# Patient Record
Sex: Female | Born: 1935
Health system: Southern US, Community
[De-identification: ages and names within clinical notes are randomized; demographics above are authoritative.]

## PROBLEM LIST (undated history)

## (undated) DIAGNOSIS — S329XXA Fracture of unspecified parts of lumbosacral spine and pelvis, initial encounter for closed fracture: Secondary | ICD-10-CM

## (undated) DIAGNOSIS — E041 Nontoxic single thyroid nodule: Secondary | ICD-10-CM

## (undated) DIAGNOSIS — D510 Vitamin B12 deficiency anemia due to intrinsic factor deficiency: Secondary | ICD-10-CM

## (undated) DIAGNOSIS — I219 Acute myocardial infarction, unspecified: Secondary | ICD-10-CM

## (undated) DIAGNOSIS — L259 Unspecified contact dermatitis, unspecified cause: Secondary | ICD-10-CM

## (undated) DIAGNOSIS — Z9889 Other specified postprocedural states: Secondary | ICD-10-CM

## (undated) DIAGNOSIS — I2510 Atherosclerotic heart disease of native coronary artery without angina pectoris: Secondary | ICD-10-CM

## (undated) DIAGNOSIS — K589 Irritable bowel syndrome without diarrhea: Secondary | ICD-10-CM

## (undated) DIAGNOSIS — Z8489 Family history of other specified conditions: Secondary | ICD-10-CM

## (undated) DIAGNOSIS — I639 Cerebral infarction, unspecified: Secondary | ICD-10-CM

## (undated) DIAGNOSIS — R9430 Abnormal result of cardiovascular function study, unspecified: Secondary | ICD-10-CM

## (undated) DIAGNOSIS — I5181 Takotsubo syndrome: Secondary | ICD-10-CM

## (undated) DIAGNOSIS — Z8719 Personal history of other diseases of the digestive system: Secondary | ICD-10-CM

## (undated) DIAGNOSIS — I69959 Hemiplegia and hemiparesis following unspecified cerebrovascular disease affecting unspecified side: Secondary | ICD-10-CM

## (undated) DIAGNOSIS — R001 Bradycardia, unspecified: Secondary | ICD-10-CM

## (undated) DIAGNOSIS — I10 Essential (primary) hypertension: Secondary | ICD-10-CM

## (undated) DIAGNOSIS — M1990 Unspecified osteoarthritis, unspecified site: Secondary | ICD-10-CM

## (undated) DIAGNOSIS — D320 Benign neoplasm of cerebral meninges: Secondary | ICD-10-CM

## (undated) DIAGNOSIS — J984 Other disorders of lung: Secondary | ICD-10-CM

## (undated) DIAGNOSIS — S066X9A Traumatic subarachnoid hemorrhage with loss of consciousness of unspecified duration, initial encounter: Secondary | ICD-10-CM

## (undated) DIAGNOSIS — R9389 Abnormal findings on diagnostic imaging of other specified body structures: Secondary | ICD-10-CM

## (undated) DIAGNOSIS — IMO0002 Reserved for concepts with insufficient information to code with codable children: Secondary | ICD-10-CM

## (undated) HISTORY — DX: Benign neoplasm of cerebral meninges: D32.0

## (undated) HISTORY — PX: ABDOMINAL HYSTERECTOMY: SHX81

## (undated) HISTORY — PX: SHOULDER SURGERY: SHX246

## (undated) HISTORY — DX: Abnormal result of cardiovascular function study, unspecified: R94.30

## (undated) HISTORY — DX: Traumatic subarachnoid hemorrhage with loss of consciousness of unspecified duration, initial encounter: S06.6X9A

## (undated) HISTORY — DX: Atherosclerotic heart disease of native coronary artery without angina pectoris: I25.10

## (undated) HISTORY — DX: Irritable bowel syndrome, unspecified: K58.9

## (undated) HISTORY — PX: CHOLECYSTECTOMY: SHX55

## (undated) HISTORY — DX: Hemiplegia and hemiparesis following unspecified cerebrovascular disease affecting unspecified side: I69.959

## (undated) HISTORY — DX: Unspecified contact dermatitis, unspecified cause: L25.9

## (undated) HISTORY — DX: Essential (primary) hypertension: I10

## (undated) HISTORY — DX: Takotsubo syndrome: I51.81

## (undated) HISTORY — DX: Other disorders of lung: J98.4

## (undated) HISTORY — DX: Vitamin B12 deficiency anemia due to intrinsic factor deficiency: D51.0

## (undated) HISTORY — DX: Reserved for concepts with insufficient information to code with codable children: IMO0002

## (undated) HISTORY — DX: Bradycardia, unspecified: R00.1

## (undated) HISTORY — DX: Unspecified osteoarthritis, unspecified site: M19.90

## (undated) HISTORY — DX: Nontoxic single thyroid nodule: E04.1

## (undated) HISTORY — PX: JOINT REPLACEMENT: SHX530

## (undated) HISTORY — PX: BRAIN TUMOR EXCISION: SHX577

---

## 1997-12-14 ENCOUNTER — Inpatient Hospital Stay (HOSPITAL_COMMUNITY): Admission: RE | Admit: 1997-12-14 | Discharge: 1997-12-19 | Payer: Self-pay | Admitting: Orthopaedic Surgery

## 1999-04-23 ENCOUNTER — Inpatient Hospital Stay (HOSPITAL_COMMUNITY): Admission: EM | Admit: 1999-04-23 | Discharge: 1999-04-24 | Payer: Self-pay | Admitting: Internal Medicine

## 1999-04-23 ENCOUNTER — Encounter: Payer: Self-pay | Admitting: Internal Medicine

## 1999-11-20 ENCOUNTER — Emergency Department (HOSPITAL_COMMUNITY): Admission: EM | Admit: 1999-11-20 | Discharge: 1999-11-20 | Payer: Self-pay

## 2000-11-29 ENCOUNTER — Encounter: Payer: Self-pay | Admitting: Emergency Medicine

## 2000-11-30 ENCOUNTER — Observation Stay (HOSPITAL_COMMUNITY): Admission: EM | Admit: 2000-11-30 | Discharge: 2000-12-01 | Payer: Self-pay | Admitting: Emergency Medicine

## 2000-12-01 ENCOUNTER — Encounter: Payer: Self-pay | Admitting: Internal Medicine

## 2001-01-12 ENCOUNTER — Encounter: Payer: Self-pay | Admitting: General Surgery

## 2001-01-13 ENCOUNTER — Encounter (INDEPENDENT_AMBULATORY_CARE_PROVIDER_SITE_OTHER): Payer: Self-pay | Admitting: *Deleted

## 2001-01-13 ENCOUNTER — Ambulatory Visit (HOSPITAL_COMMUNITY): Admission: RE | Admit: 2001-01-13 | Discharge: 2001-01-14 | Payer: Self-pay | Admitting: General Surgery

## 2001-01-13 ENCOUNTER — Encounter: Payer: Self-pay | Admitting: General Surgery

## 2001-06-17 ENCOUNTER — Encounter: Payer: Self-pay | Admitting: Internal Medicine

## 2001-08-18 ENCOUNTER — Encounter: Payer: Self-pay | Admitting: Internal Medicine

## 2001-08-18 ENCOUNTER — Inpatient Hospital Stay (HOSPITAL_COMMUNITY): Admission: EM | Admit: 2001-08-18 | Discharge: 2001-08-21 | Payer: Self-pay | Admitting: Internal Medicine

## 2001-08-20 ENCOUNTER — Encounter: Payer: Self-pay | Admitting: Gastroenterology

## 2002-11-13 ENCOUNTER — Emergency Department (HOSPITAL_COMMUNITY): Admission: EM | Admit: 2002-11-13 | Discharge: 2002-11-14 | Payer: Self-pay | Admitting: Emergency Medicine

## 2002-11-14 ENCOUNTER — Encounter: Payer: Self-pay | Admitting: *Deleted

## 2003-12-13 ENCOUNTER — Ambulatory Visit: Payer: Self-pay | Admitting: Internal Medicine

## 2004-01-06 ENCOUNTER — Ambulatory Visit: Payer: Self-pay | Admitting: Internal Medicine

## 2004-02-13 ENCOUNTER — Ambulatory Visit: Payer: Self-pay | Admitting: Internal Medicine

## 2004-03-16 ENCOUNTER — Ambulatory Visit: Payer: Self-pay | Admitting: Internal Medicine

## 2004-04-12 ENCOUNTER — Ambulatory Visit: Payer: Self-pay | Admitting: Internal Medicine

## 2004-05-09 ENCOUNTER — Ambulatory Visit: Payer: Self-pay | Admitting: Internal Medicine

## 2004-06-11 ENCOUNTER — Ambulatory Visit: Payer: Self-pay | Admitting: Internal Medicine

## 2004-07-11 ENCOUNTER — Ambulatory Visit: Payer: Self-pay | Admitting: Internal Medicine

## 2004-08-13 ENCOUNTER — Ambulatory Visit: Payer: Self-pay | Admitting: Internal Medicine

## 2004-09-11 ENCOUNTER — Ambulatory Visit: Payer: Self-pay | Admitting: Internal Medicine

## 2004-10-12 ENCOUNTER — Ambulatory Visit: Payer: Self-pay | Admitting: Internal Medicine

## 2004-11-14 ENCOUNTER — Ambulatory Visit: Payer: Self-pay | Admitting: Internal Medicine

## 2004-12-14 ENCOUNTER — Ambulatory Visit: Payer: Self-pay | Admitting: Internal Medicine

## 2004-12-20 ENCOUNTER — Ambulatory Visit: Payer: Self-pay | Admitting: Internal Medicine

## 2004-12-24 ENCOUNTER — Ambulatory Visit: Payer: Self-pay | Admitting: Internal Medicine

## 2005-01-02 ENCOUNTER — Ambulatory Visit: Payer: Self-pay | Admitting: Internal Medicine

## 2005-01-09 ENCOUNTER — Ambulatory Visit: Payer: Self-pay | Admitting: Internal Medicine

## 2005-02-04 DIAGNOSIS — S066X9A Traumatic subarachnoid hemorrhage with loss of consciousness of unspecified duration, initial encounter: Secondary | ICD-10-CM

## 2005-02-04 DIAGNOSIS — S066XAA Traumatic subarachnoid hemorrhage with loss of consciousness status unknown, initial encounter: Secondary | ICD-10-CM

## 2005-02-04 HISTORY — DX: Traumatic subarachnoid hemorrhage with loss of consciousness status unknown, initial encounter: S06.6XAA

## 2005-02-04 HISTORY — DX: Traumatic subarachnoid hemorrhage with loss of consciousness of unspecified duration, initial encounter: S06.6X9A

## 2005-02-13 ENCOUNTER — Ambulatory Visit: Payer: Self-pay | Admitting: Internal Medicine

## 2005-03-11 ENCOUNTER — Ambulatory Visit: Payer: Self-pay | Admitting: Internal Medicine

## 2005-04-10 ENCOUNTER — Ambulatory Visit: Payer: Self-pay | Admitting: Internal Medicine

## 2005-05-22 ENCOUNTER — Ambulatory Visit: Payer: Self-pay | Admitting: Internal Medicine

## 2005-06-12 ENCOUNTER — Ambulatory Visit: Payer: Self-pay | Admitting: Internal Medicine

## 2005-06-19 ENCOUNTER — Ambulatory Visit: Payer: Self-pay | Admitting: Internal Medicine

## 2005-07-17 ENCOUNTER — Ambulatory Visit: Payer: Self-pay | Admitting: Internal Medicine

## 2005-08-14 ENCOUNTER — Ambulatory Visit: Payer: Self-pay | Admitting: Internal Medicine

## 2005-09-11 ENCOUNTER — Ambulatory Visit: Payer: Self-pay | Admitting: Internal Medicine

## 2005-10-10 ENCOUNTER — Ambulatory Visit: Payer: Self-pay | Admitting: Internal Medicine

## 2005-11-07 ENCOUNTER — Ambulatory Visit: Payer: Self-pay | Admitting: Internal Medicine

## 2005-11-20 ENCOUNTER — Ambulatory Visit: Payer: Self-pay | Admitting: Internal Medicine

## 2005-11-28 ENCOUNTER — Ambulatory Visit: Payer: Self-pay

## 2005-11-28 ENCOUNTER — Encounter: Payer: Self-pay | Admitting: Internal Medicine

## 2005-12-18 ENCOUNTER — Ambulatory Visit: Payer: Self-pay | Admitting: Internal Medicine

## 2006-01-15 ENCOUNTER — Ambulatory Visit: Payer: Self-pay | Admitting: Internal Medicine

## 2006-01-18 ENCOUNTER — Observation Stay (HOSPITAL_COMMUNITY): Admission: EM | Admit: 2006-01-18 | Discharge: 2006-01-19 | Payer: Self-pay | Admitting: Emergency Medicine

## 2006-01-24 ENCOUNTER — Ambulatory Visit: Payer: Self-pay | Admitting: Family Medicine

## 2006-02-12 ENCOUNTER — Ambulatory Visit: Payer: Self-pay | Admitting: Internal Medicine

## 2006-02-20 ENCOUNTER — Encounter: Admission: RE | Admit: 2006-02-20 | Discharge: 2006-02-20 | Payer: Self-pay | Admitting: Neurosurgery

## 2006-03-14 ENCOUNTER — Ambulatory Visit: Payer: Self-pay | Admitting: Internal Medicine

## 2006-03-20 ENCOUNTER — Encounter: Payer: Self-pay | Admitting: Internal Medicine

## 2006-04-11 ENCOUNTER — Ambulatory Visit: Payer: Self-pay | Admitting: Internal Medicine

## 2006-05-12 ENCOUNTER — Ambulatory Visit: Payer: Self-pay | Admitting: Internal Medicine

## 2006-05-27 ENCOUNTER — Ambulatory Visit: Payer: Self-pay | Admitting: Internal Medicine

## 2006-06-12 ENCOUNTER — Ambulatory Visit: Payer: Self-pay | Admitting: Internal Medicine

## 2006-06-19 ENCOUNTER — Ambulatory Visit: Payer: Self-pay | Admitting: Internal Medicine

## 2006-06-19 ENCOUNTER — Inpatient Hospital Stay (HOSPITAL_COMMUNITY): Admission: EM | Admit: 2006-06-19 | Discharge: 2006-06-23 | Payer: Self-pay | Admitting: Emergency Medicine

## 2006-06-19 ENCOUNTER — Ambulatory Visit: Payer: Self-pay | Admitting: Cardiology

## 2006-06-20 ENCOUNTER — Encounter: Payer: Self-pay | Admitting: Internal Medicine

## 2006-06-22 ENCOUNTER — Encounter: Payer: Self-pay | Admitting: Internal Medicine

## 2006-06-23 ENCOUNTER — Encounter: Payer: Self-pay | Admitting: Internal Medicine

## 2006-07-01 ENCOUNTER — Ambulatory Visit: Payer: Self-pay | Admitting: Cardiology

## 2006-07-01 LAB — CONVERTED CEMR LAB
Albumin: 3.6 g/dL (ref 3.5–5.2)
CO2: 29 meq/L (ref 19–32)
Creatinine, Ser: 0.7 mg/dL (ref 0.4–1.2)
Phosphorus: 3.6 mg/dL (ref 2.3–4.6)
Sodium: 139 meq/L (ref 135–145)

## 2006-07-02 ENCOUNTER — Ambulatory Visit: Payer: Self-pay | Admitting: Internal Medicine

## 2006-07-09 ENCOUNTER — Ambulatory Visit: Payer: Self-pay | Admitting: Cardiology

## 2006-07-14 ENCOUNTER — Ambulatory Visit: Payer: Self-pay | Admitting: Internal Medicine

## 2006-07-15 ENCOUNTER — Encounter: Payer: Self-pay | Admitting: Internal Medicine

## 2006-07-15 DIAGNOSIS — I10 Essential (primary) hypertension: Secondary | ICD-10-CM | POA: Insufficient documentation

## 2006-07-15 DIAGNOSIS — Z9889 Other specified postprocedural states: Secondary | ICD-10-CM | POA: Insufficient documentation

## 2006-07-15 DIAGNOSIS — M1990 Unspecified osteoarthritis, unspecified site: Secondary | ICD-10-CM | POA: Insufficient documentation

## 2006-07-15 HISTORY — DX: Unspecified osteoarthritis, unspecified site: M19.90

## 2006-07-15 HISTORY — DX: Essential (primary) hypertension: I10

## 2006-07-21 ENCOUNTER — Ambulatory Visit: Payer: Self-pay | Admitting: Internal Medicine

## 2006-07-21 ENCOUNTER — Inpatient Hospital Stay (HOSPITAL_COMMUNITY): Admission: AD | Admit: 2006-07-21 | Discharge: 2006-07-24 | Payer: Self-pay | Admitting: Internal Medicine

## 2006-07-29 ENCOUNTER — Ambulatory Visit: Payer: Self-pay | Admitting: Internal Medicine

## 2006-07-29 LAB — CONVERTED CEMR LAB
AST: 35 units/L (ref 0–37)
Bilirubin, Direct: 0.1 mg/dL (ref 0.0–0.3)
Total Bilirubin: 0.6 mg/dL (ref 0.3–1.2)
Total Protein: 6.1 g/dL (ref 6.0–8.3)

## 2006-08-04 ENCOUNTER — Ambulatory Visit: Payer: Self-pay | Admitting: Cardiology

## 2006-08-04 LAB — CONVERTED CEMR LAB
ALT: 29 units/L (ref 0–35)
Alkaline Phosphatase: 220 units/L — ABNORMAL HIGH (ref 39–117)
Cholesterol: 192 mg/dL (ref 0–200)
Total Bilirubin: 0.7 mg/dL (ref 0.3–1.2)
Total Protein: 7 g/dL (ref 6.0–8.3)

## 2006-08-13 ENCOUNTER — Ambulatory Visit: Payer: Self-pay | Admitting: Internal Medicine

## 2006-08-14 ENCOUNTER — Ambulatory Visit: Payer: Self-pay | Admitting: Cardiology

## 2006-08-21 ENCOUNTER — Encounter: Payer: Self-pay | Admitting: Internal Medicine

## 2006-08-22 ENCOUNTER — Encounter: Payer: Self-pay | Admitting: Cardiology

## 2006-08-22 ENCOUNTER — Ambulatory Visit: Payer: Self-pay

## 2006-08-27 ENCOUNTER — Ambulatory Visit: Payer: Self-pay | Admitting: Internal Medicine

## 2006-08-27 DIAGNOSIS — D320 Benign neoplasm of cerebral meninges: Secondary | ICD-10-CM | POA: Insufficient documentation

## 2006-08-27 HISTORY — DX: Benign neoplasm of cerebral meninges: D32.0

## 2006-09-10 ENCOUNTER — Ambulatory Visit: Payer: Self-pay | Admitting: Internal Medicine

## 2006-09-10 DIAGNOSIS — N3000 Acute cystitis without hematuria: Secondary | ICD-10-CM | POA: Insufficient documentation

## 2006-09-10 DIAGNOSIS — D510 Vitamin B12 deficiency anemia due to intrinsic factor deficiency: Secondary | ICD-10-CM

## 2006-09-10 HISTORY — DX: Vitamin B12 deficiency anemia due to intrinsic factor deficiency: D51.0

## 2006-09-17 ENCOUNTER — Encounter: Payer: Self-pay | Admitting: Internal Medicine

## 2006-09-18 ENCOUNTER — Encounter: Payer: Self-pay | Admitting: Internal Medicine

## 2006-09-23 ENCOUNTER — Encounter: Payer: Self-pay | Admitting: Internal Medicine

## 2006-09-27 ENCOUNTER — Encounter: Payer: Self-pay | Admitting: Internal Medicine

## 2006-10-08 ENCOUNTER — Encounter: Payer: Self-pay | Admitting: Internal Medicine

## 2006-10-10 ENCOUNTER — Telehealth: Payer: Self-pay | Admitting: Family Medicine

## 2006-10-14 ENCOUNTER — Ambulatory Visit: Payer: Self-pay | Admitting: Internal Medicine

## 2006-10-14 LAB — CONVERTED CEMR LAB
Ketones, urine, test strip: NEGATIVE
Nitrite: POSITIVE
Specific Gravity, Urine: 1.025
pH: 5

## 2006-10-16 ENCOUNTER — Encounter: Payer: Self-pay | Admitting: Internal Medicine

## 2006-10-24 ENCOUNTER — Ambulatory Visit: Payer: Self-pay | Admitting: Cardiology

## 2006-11-12 ENCOUNTER — Ambulatory Visit: Payer: Self-pay | Admitting: Internal Medicine

## 2006-12-01 ENCOUNTER — Encounter: Payer: Self-pay | Admitting: Internal Medicine

## 2006-12-03 ENCOUNTER — Ambulatory Visit: Payer: Self-pay | Admitting: Internal Medicine

## 2006-12-10 ENCOUNTER — Ambulatory Visit: Payer: Self-pay | Admitting: Internal Medicine

## 2006-12-18 ENCOUNTER — Encounter: Payer: Self-pay | Admitting: Internal Medicine

## 2007-01-09 ENCOUNTER — Ambulatory Visit: Payer: Self-pay | Admitting: Internal Medicine

## 2007-01-14 ENCOUNTER — Ambulatory Visit: Payer: Self-pay | Admitting: Internal Medicine

## 2007-01-14 DIAGNOSIS — E041 Nontoxic single thyroid nodule: Secondary | ICD-10-CM | POA: Insufficient documentation

## 2007-01-14 DIAGNOSIS — R911 Solitary pulmonary nodule: Secondary | ICD-10-CM | POA: Insufficient documentation

## 2007-01-14 DIAGNOSIS — J984 Other disorders of lung: Secondary | ICD-10-CM

## 2007-01-14 HISTORY — DX: Nontoxic single thyroid nodule: E04.1

## 2007-01-14 HISTORY — DX: Other disorders of lung: J98.4

## 2007-01-15 ENCOUNTER — Ambulatory Visit: Payer: Self-pay | Admitting: Cardiology

## 2007-02-12 ENCOUNTER — Ambulatory Visit: Payer: Self-pay | Admitting: Internal Medicine

## 2007-03-02 ENCOUNTER — Ambulatory Visit: Payer: Self-pay | Admitting: Internal Medicine

## 2007-03-02 DIAGNOSIS — L259 Unspecified contact dermatitis, unspecified cause: Secondary | ICD-10-CM

## 2007-03-02 HISTORY — DX: Unspecified contact dermatitis, unspecified cause: L25.9

## 2007-03-16 ENCOUNTER — Ambulatory Visit: Payer: Self-pay | Admitting: Internal Medicine

## 2007-03-23 ENCOUNTER — Encounter: Payer: Self-pay | Admitting: Internal Medicine

## 2007-03-25 ENCOUNTER — Ambulatory Visit: Payer: Self-pay | Admitting: Internal Medicine

## 2007-04-13 ENCOUNTER — Ambulatory Visit: Payer: Self-pay | Admitting: Internal Medicine

## 2007-05-13 ENCOUNTER — Ambulatory Visit: Payer: Self-pay | Admitting: Internal Medicine

## 2007-06-01 ENCOUNTER — Ambulatory Visit: Payer: Self-pay | Admitting: Internal Medicine

## 2007-06-01 DIAGNOSIS — R071 Chest pain on breathing: Secondary | ICD-10-CM | POA: Insufficient documentation

## 2007-06-18 ENCOUNTER — Telehealth: Payer: Self-pay | Admitting: Internal Medicine

## 2007-06-18 ENCOUNTER — Inpatient Hospital Stay (HOSPITAL_COMMUNITY): Admission: EM | Admit: 2007-06-18 | Discharge: 2007-06-22 | Payer: Self-pay | Admitting: Emergency Medicine

## 2007-06-18 ENCOUNTER — Encounter (INDEPENDENT_AMBULATORY_CARE_PROVIDER_SITE_OTHER): Payer: Self-pay | Admitting: Neurology

## 2007-06-18 ENCOUNTER — Ambulatory Visit: Payer: Self-pay | Admitting: Cardiology

## 2007-06-19 ENCOUNTER — Telehealth: Payer: Self-pay | Admitting: Internal Medicine

## 2007-06-19 ENCOUNTER — Encounter: Payer: Self-pay | Admitting: Cardiology

## 2007-06-19 ENCOUNTER — Encounter (INDEPENDENT_AMBULATORY_CARE_PROVIDER_SITE_OTHER): Payer: Self-pay | Admitting: Neurology

## 2007-07-03 ENCOUNTER — Telehealth: Payer: Self-pay | Admitting: Internal Medicine

## 2007-07-13 ENCOUNTER — Ambulatory Visit: Payer: Self-pay | Admitting: Internal Medicine

## 2007-07-13 ENCOUNTER — Ambulatory Visit: Payer: Self-pay | Admitting: Cardiology

## 2007-07-21 ENCOUNTER — Encounter: Payer: Self-pay | Admitting: Internal Medicine

## 2007-07-22 ENCOUNTER — Ambulatory Visit: Payer: Self-pay | Admitting: Internal Medicine

## 2007-07-22 DIAGNOSIS — I69959 Hemiplegia and hemiparesis following unspecified cerebrovascular disease affecting unspecified side: Secondary | ICD-10-CM

## 2007-07-22 HISTORY — DX: Hemiplegia and hemiparesis following unspecified cerebrovascular disease affecting unspecified side: I69.959

## 2007-07-24 ENCOUNTER — Ambulatory Visit: Payer: Self-pay

## 2007-08-11 ENCOUNTER — Ambulatory Visit: Payer: Self-pay | Admitting: Internal Medicine

## 2007-08-11 ENCOUNTER — Encounter: Admission: RE | Admit: 2007-08-11 | Discharge: 2007-11-03 | Payer: Self-pay | Admitting: Neurology

## 2007-09-08 ENCOUNTER — Ambulatory Visit: Payer: Self-pay | Admitting: Internal Medicine

## 2007-09-10 ENCOUNTER — Inpatient Hospital Stay (HOSPITAL_COMMUNITY): Admission: EM | Admit: 2007-09-10 | Discharge: 2007-09-11 | Payer: Self-pay | Admitting: Emergency Medicine

## 2007-09-10 ENCOUNTER — Ambulatory Visit: Payer: Self-pay | Admitting: Endocrinology

## 2007-09-15 ENCOUNTER — Encounter: Payer: Self-pay | Admitting: Internal Medicine

## 2007-09-24 ENCOUNTER — Ambulatory Visit: Payer: Self-pay | Admitting: Internal Medicine

## 2007-10-05 ENCOUNTER — Encounter: Payer: Self-pay | Admitting: Internal Medicine

## 2007-10-06 ENCOUNTER — Ambulatory Visit: Payer: Self-pay | Admitting: Internal Medicine

## 2007-10-07 ENCOUNTER — Ambulatory Visit: Payer: Self-pay | Admitting: Cardiology

## 2007-11-11 ENCOUNTER — Ambulatory Visit: Payer: Self-pay | Admitting: Internal Medicine

## 2007-11-24 ENCOUNTER — Ambulatory Visit: Payer: Self-pay | Admitting: Internal Medicine

## 2007-11-25 ENCOUNTER — Encounter: Payer: Self-pay | Admitting: Internal Medicine

## 2007-12-09 ENCOUNTER — Ambulatory Visit: Payer: Self-pay | Admitting: Cardiology

## 2007-12-09 ENCOUNTER — Encounter: Payer: Self-pay | Admitting: Cardiology

## 2007-12-09 ENCOUNTER — Observation Stay (HOSPITAL_COMMUNITY): Admission: EM | Admit: 2007-12-09 | Discharge: 2007-12-11 | Payer: Self-pay | Admitting: Emergency Medicine

## 2007-12-15 ENCOUNTER — Ambulatory Visit: Payer: Self-pay | Admitting: Internal Medicine

## 2008-01-08 ENCOUNTER — Ambulatory Visit: Payer: Self-pay | Admitting: Cardiology

## 2008-01-12 ENCOUNTER — Ambulatory Visit: Payer: Self-pay | Admitting: Internal Medicine

## 2008-01-12 ENCOUNTER — Encounter: Admission: RE | Admit: 2008-01-12 | Discharge: 2008-01-12 | Payer: Self-pay | Admitting: Neurology

## 2008-02-12 ENCOUNTER — Ambulatory Visit: Payer: Self-pay | Admitting: Internal Medicine

## 2008-03-15 ENCOUNTER — Ambulatory Visit: Payer: Self-pay | Admitting: Internal Medicine

## 2008-03-29 ENCOUNTER — Ambulatory Visit: Payer: Self-pay | Admitting: Internal Medicine

## 2008-04-12 ENCOUNTER — Ambulatory Visit: Payer: Self-pay | Admitting: Internal Medicine

## 2008-04-22 ENCOUNTER — Encounter: Payer: Self-pay | Admitting: Cardiology

## 2008-04-22 ENCOUNTER — Ambulatory Visit: Payer: Self-pay | Admitting: Cardiology

## 2008-04-22 DIAGNOSIS — I5181 Takotsubo syndrome: Secondary | ICD-10-CM

## 2008-04-22 HISTORY — DX: Takotsubo syndrome: I51.81

## 2008-05-10 ENCOUNTER — Ambulatory Visit: Payer: Self-pay | Admitting: Internal Medicine

## 2008-05-19 ENCOUNTER — Encounter: Payer: Self-pay | Admitting: Internal Medicine

## 2008-05-31 ENCOUNTER — Encounter: Payer: Self-pay | Admitting: Internal Medicine

## 2008-06-07 ENCOUNTER — Ambulatory Visit: Payer: Self-pay | Admitting: Internal Medicine

## 2008-06-28 ENCOUNTER — Ambulatory Visit: Payer: Self-pay | Admitting: Internal Medicine

## 2008-06-28 ENCOUNTER — Ambulatory Visit: Payer: Self-pay | Admitting: Cardiology

## 2008-06-28 ENCOUNTER — Observation Stay (HOSPITAL_COMMUNITY): Admission: AD | Admit: 2008-06-28 | Discharge: 2008-06-29 | Payer: Self-pay | Admitting: Internal Medicine

## 2008-07-05 ENCOUNTER — Ambulatory Visit: Payer: Self-pay | Admitting: Internal Medicine

## 2008-07-08 ENCOUNTER — Inpatient Hospital Stay (HOSPITAL_COMMUNITY): Admission: EM | Admit: 2008-07-08 | Discharge: 2008-07-11 | Payer: Self-pay | Admitting: Emergency Medicine

## 2008-07-08 ENCOUNTER — Ambulatory Visit: Payer: Self-pay | Admitting: Internal Medicine

## 2008-07-11 ENCOUNTER — Encounter (INDEPENDENT_AMBULATORY_CARE_PROVIDER_SITE_OTHER): Payer: Self-pay | Admitting: Neurology

## 2008-07-21 ENCOUNTER — Ambulatory Visit: Payer: Self-pay | Admitting: Internal Medicine

## 2008-07-21 LAB — CONVERTED CEMR LAB
Basophils Absolute: 0 10*3/uL (ref 0.0–0.1)
Eosinophils Absolute: 0.3 10*3/uL (ref 0.0–0.7)
Hemoglobin: 11.2 g/dL — ABNORMAL LOW (ref 12.0–15.0)
Lymphocytes Relative: 25.4 % (ref 12.0–46.0)
MCHC: 32.7 g/dL (ref 30.0–36.0)
Monocytes Absolute: 0.8 10*3/uL (ref 0.1–1.0)
Neutro Abs: 4.6 10*3/uL (ref 1.4–7.7)
RDW: 15.7 % — ABNORMAL HIGH (ref 11.5–14.6)

## 2008-08-05 ENCOUNTER — Encounter: Payer: Self-pay | Admitting: Cardiology

## 2008-08-09 ENCOUNTER — Encounter: Payer: Self-pay | Admitting: Cardiology

## 2008-08-09 ENCOUNTER — Ambulatory Visit: Payer: Self-pay | Admitting: Cardiology

## 2008-08-09 ENCOUNTER — Ambulatory Visit: Payer: Self-pay

## 2008-08-10 ENCOUNTER — Ambulatory Visit: Payer: Self-pay | Admitting: Internal Medicine

## 2008-09-02 ENCOUNTER — Encounter: Admission: RE | Admit: 2008-09-02 | Discharge: 2008-09-02 | Payer: Self-pay | Admitting: Chiropractic Medicine

## 2008-09-07 ENCOUNTER — Ambulatory Visit: Payer: Self-pay | Admitting: Internal Medicine

## 2008-09-27 ENCOUNTER — Encounter (INDEPENDENT_AMBULATORY_CARE_PROVIDER_SITE_OTHER): Payer: Self-pay | Admitting: *Deleted

## 2008-10-04 ENCOUNTER — Ambulatory Visit: Payer: Self-pay | Admitting: Internal Medicine

## 2008-10-27 ENCOUNTER — Encounter (INDEPENDENT_AMBULATORY_CARE_PROVIDER_SITE_OTHER): Payer: Self-pay | Admitting: *Deleted

## 2008-11-04 ENCOUNTER — Ambulatory Visit: Payer: Self-pay | Admitting: Internal Medicine

## 2008-11-07 ENCOUNTER — Ambulatory Visit: Payer: Self-pay | Admitting: Internal Medicine

## 2008-11-07 DIAGNOSIS — I2510 Atherosclerotic heart disease of native coronary artery without angina pectoris: Secondary | ICD-10-CM

## 2008-11-07 HISTORY — DX: Atherosclerotic heart disease of native coronary artery without angina pectoris: I25.10

## 2008-12-06 ENCOUNTER — Ambulatory Visit: Payer: Self-pay | Admitting: Internal Medicine

## 2008-12-14 ENCOUNTER — Encounter: Payer: Self-pay | Admitting: Cardiology

## 2009-01-04 ENCOUNTER — Ambulatory Visit: Payer: Self-pay | Admitting: Internal Medicine

## 2009-02-07 ENCOUNTER — Ambulatory Visit: Payer: Self-pay | Admitting: Internal Medicine

## 2009-02-20 ENCOUNTER — Encounter: Payer: Self-pay | Admitting: Cardiology

## 2009-02-21 ENCOUNTER — Ambulatory Visit: Payer: Self-pay | Admitting: Cardiology

## 2009-03-10 ENCOUNTER — Ambulatory Visit: Payer: Self-pay | Admitting: Internal Medicine

## 2009-04-07 ENCOUNTER — Ambulatory Visit: Payer: Self-pay | Admitting: Internal Medicine

## 2009-04-24 ENCOUNTER — Telehealth: Payer: Self-pay | Admitting: Internal Medicine

## 2009-05-16 ENCOUNTER — Ambulatory Visit: Payer: Self-pay | Admitting: Internal Medicine

## 2009-06-13 ENCOUNTER — Ambulatory Visit: Payer: Self-pay | Admitting: Internal Medicine

## 2009-06-22 ENCOUNTER — Encounter (INDEPENDENT_AMBULATORY_CARE_PROVIDER_SITE_OTHER): Payer: Self-pay | Admitting: *Deleted

## 2009-07-12 ENCOUNTER — Ambulatory Visit: Payer: Self-pay | Admitting: Internal Medicine

## 2009-07-20 ENCOUNTER — Encounter: Payer: Self-pay | Admitting: Internal Medicine

## 2009-07-20 ENCOUNTER — Encounter: Payer: Self-pay | Admitting: Cardiology

## 2009-08-16 ENCOUNTER — Ambulatory Visit: Payer: Self-pay | Admitting: Cardiology

## 2009-09-06 ENCOUNTER — Ambulatory Visit: Payer: Self-pay | Admitting: Internal Medicine

## 2009-09-20 ENCOUNTER — Ambulatory Visit: Payer: Self-pay | Admitting: Internal Medicine

## 2009-09-20 LAB — CONVERTED CEMR LAB
Ketones, urine, test strip: NEGATIVE
Nitrite: NEGATIVE
Specific Gravity, Urine: 1.015
WBC Urine, dipstick: NEGATIVE

## 2009-09-21 LAB — CONVERTED CEMR LAB
ALT: 12 units/L (ref 0–35)
AST: 15 units/L (ref 0–37)
BUN: 21 mg/dL (ref 6–23)
Basophils Absolute: 0 10*3/uL (ref 0.0–0.1)
Bilirubin, Direct: 0.1 mg/dL (ref 0.0–0.3)
Calcium: 9.1 mg/dL (ref 8.4–10.5)
Creatinine, Ser: 1 mg/dL (ref 0.4–1.2)
Eosinophils Relative: 2.8 % (ref 0.0–5.0)
GFR calc non Af Amer: 58.63 mL/min (ref >60)
Lymphocytes Relative: 16.2 % (ref 12.0–46.0)
Monocytes Relative: 11.6 % (ref 3.0–12.0)
Neutrophils Relative %: 68.9 % (ref 43.0–77.0)
Platelets: 217 10*3/uL (ref 150.0–400.0)
Potassium: 4.5 meq/L (ref 3.5–5.1)
Sed Rate: 14 mm/hr (ref 0–22)
Total Bilirubin: 0.6 mg/dL (ref 0.3–1.2)
WBC: 9.9 10*3/uL (ref 4.5–10.5)

## 2009-10-03 ENCOUNTER — Encounter
Admission: RE | Admit: 2009-10-03 | Discharge: 2009-10-20 | Payer: Self-pay | Source: Home / Self Care | Admitting: Internal Medicine

## 2009-10-04 ENCOUNTER — Encounter: Payer: Self-pay | Admitting: Internal Medicine

## 2009-10-05 ENCOUNTER — Ambulatory Visit: Payer: Self-pay | Admitting: Internal Medicine

## 2009-10-19 ENCOUNTER — Encounter: Payer: Self-pay | Admitting: Internal Medicine

## 2009-11-07 ENCOUNTER — Ambulatory Visit: Payer: Self-pay | Admitting: Internal Medicine

## 2009-12-12 ENCOUNTER — Ambulatory Visit: Payer: Self-pay | Admitting: Internal Medicine

## 2009-12-19 ENCOUNTER — Encounter: Payer: Self-pay | Admitting: Internal Medicine

## 2009-12-21 ENCOUNTER — Ambulatory Visit: Payer: Self-pay | Admitting: Internal Medicine

## 2009-12-21 ENCOUNTER — Encounter: Payer: Self-pay | Admitting: Internal Medicine

## 2009-12-26 ENCOUNTER — Ambulatory Visit: Payer: Self-pay | Admitting: Internal Medicine

## 2009-12-26 ENCOUNTER — Telehealth: Payer: Self-pay | Admitting: Internal Medicine

## 2010-01-02 ENCOUNTER — Ambulatory Visit: Payer: Self-pay | Admitting: Cardiology

## 2010-01-05 DIAGNOSIS — R935 Abnormal findings on diagnostic imaging of other abdominal regions, including retroperitoneum: Secondary | ICD-10-CM | POA: Insufficient documentation

## 2010-01-09 ENCOUNTER — Ambulatory Visit (HOSPITAL_COMMUNITY)
Admission: RE | Admit: 2010-01-09 | Discharge: 2010-01-09 | Payer: Self-pay | Source: Home / Self Care | Attending: Internal Medicine | Admitting: Internal Medicine

## 2010-01-10 ENCOUNTER — Ambulatory Visit: Payer: Self-pay | Admitting: Internal Medicine

## 2010-01-12 ENCOUNTER — Ambulatory Visit: Payer: Self-pay | Admitting: Internal Medicine

## 2010-01-24 ENCOUNTER — Ambulatory Visit: Payer: Self-pay | Admitting: Pulmonary Disease

## 2010-01-25 ENCOUNTER — Telehealth: Payer: Self-pay

## 2010-01-25 DIAGNOSIS — M25519 Pain in unspecified shoulder: Secondary | ICD-10-CM | POA: Insufficient documentation

## 2010-01-25 DIAGNOSIS — M549 Dorsalgia, unspecified: Secondary | ICD-10-CM | POA: Insufficient documentation

## 2010-02-09 ENCOUNTER — Ambulatory Visit
Admission: RE | Admit: 2010-02-09 | Discharge: 2010-02-09 | Payer: Self-pay | Source: Home / Self Care | Attending: Internal Medicine | Admitting: Internal Medicine

## 2010-02-19 ENCOUNTER — Encounter: Payer: Self-pay | Admitting: Internal Medicine

## 2010-02-20 ENCOUNTER — Ambulatory Visit
Admission: RE | Admit: 2010-02-20 | Discharge: 2010-02-20 | Payer: Self-pay | Source: Home / Self Care | Attending: Cardiology | Admitting: Cardiology

## 2010-03-06 NOTE — Assessment & Plan Note (Signed)
Summary: 4 month rov/njr rsc bmp/njr   Vital Signs:  Patient profile:   77 year old female Weight:      123 pounds Temp:     97.6 degrees F oral BP sitting:   112 / 62  (right arm) Cuff size:   regular  Vitals Entered By: Kathrynn Speed CMA (September 20, 2009 10:49 AM) CC: 4 month rov, trouble urinating only drops at a time, stomach pain, for couple of years, meds not helping, getting worse,wakes her in the night, Right shoulder pain, for years, src   Primary Care Provider:   Victoriano Lain M.D.  CC:  4 month rov, trouble urinating only drops at a time, stomach pain, for couple of years, meds not helping, getting worse, wakes her in the night, Right shoulder pain, for years, and src.  History of Present Illness:  77 year old patient who is seen today for follow up.  she has a history of coronary artery disease, hypertension, and osteoarthritis.  She has multiple complaints today.  This includes persistent pain involving the left upper interscapular region of the back with radiation to the left chest wall.  Also describes her chronic abdominal pain.  Additionally, she has some urinary frequency and dribbling, but no definite dysuria.  She does have a history of pernicious anemia.  She describes postprandial abdominal pain and some nausea.  There is been no documented weight loss.  She does have occasional diarrhea that is stable at present.  Also complains of some episodic rhinorrhea mainly in the morning  Current Medications (verified): 1)  Carvedilol 6.25 Mg Tabs (Carvedilol) .... Take One Tablet By Mouth Twice A Day 2)  Premarin 0.625 Mg/gm  Crea (Estrogens, Conjugated) .... Use As Needed 3)  Cyanocobalamin 1000 Mcg/ml Soln (Cyanocobalamin) .... Administer 1 Ml Intramuscularly Once A Month 4)  Nitroquick 0.4 Mg  Subl (Nitroglycerin) .... As Needed 5)  Triamcinolone Acetonide 0.1 %  Oint (Triamcinolone Acetonide) .... Once Daily As Needed 6)  Dipyridamole 75 Mg Tabs (Dipyridamole) ....  One Twice Daily 7)  Ecotrin 325 Mg Tbec (Aspirin) .... One Daily  Allergies (verified): 1)  ! Sulf-10 2)  ! Dilantin (Phenytoin Sodium Extended) 3)  ! Phenergan (Promethazine Hcl) 4)  ! Lipitor (Atorvastatin) 5)  Sulfamethoxazole (Sulfamethoxazole)  Past History:  Past Medical History: Reviewed history from 08/16/2009 and no changes required. Hypertension Occipital meningioma treated with surgery at [facility] in 2008 / stable by MRI...[facility]....10/2008  /  patient seen [facility]  June, 2011... MRI... no change in lesion... status post gamma knife for right occipital atypical meningioma MI in May 2008.  See note July 24, 2007.  Question of takotsubo event. /  echo... July, 2010 .Marland Kitchen excellent LV function EF 55-60%...echo.Marland Kitchen.08/2008 CVA in May 2009...decision by neurology was aspirin and Plavix.  Subarachnoid hemorrhage secondary to fall in December 2007..traumatic Osteoarthritis B-12 deficiency DJD IBS thyroid nodule.  Right pontine infarct (stroke) July 08, 2008..changed to Aggrenox  Past Surgical History: Reviewed history from 06/28/2008 and no changes required. Cholecystectomy Hysterectomy Hip surgery  status-post resection meningioma 2008; status post radiosurgery for residual meningioma, Jun 09, 2008  right rotator cuff repair heart catheterization- December 08, 2007  Review of Systems       The patient complains of anorexia, weight loss, chest pain, abdominal pain, and muscle weakness.  The patient denies fever, weight gain, vision loss, decreased hearing, hoarseness, syncope, dyspnea on exertion, peripheral edema, prolonged cough, headaches, hemoptysis, melena, hematochezia, severe indigestion/heartburn, hematuria, incontinence, genital sores, suspicious skin lesions, transient  blindness, difficulty walking, depression, unusual weight change, abnormal bleeding, enlarged lymph nodes, angioedema, and breast masses.    Physical Exam  General:  Well-developed,well-nourished,in no acute  distress; alert,appropriate and cooperative throughout examination; 110/68 Head:  Normocephalic and atraumatic without obvious abnormalities. No apparent alopecia or balding. Mouth:  Oral mucosa and oropharynx without lesions or exudates.  Teeth in good repair. Chest Wall:   some tenderness over the left interscapular area Breasts:  No mass, nodules, thickening, tenderness, bulging, retraction, inflamation, nipple discharge or skin changes noted.   Lungs:  Normal respiratory effort, chest expands symmetrically. Lungs are clear to auscultation, no crackles or wheezes. Heart:  Normal rate and regular rhythm. S1 and S2 normal without gallop, murmur, click, rub or other extra sounds. Abdomen:  Bowel sounds positive,abdomen soft and non-tender without masses, organomegaly or hernias noted. Msk:  No deformity or scoliosis noted of thoracic or lumbar spine.   Pulses:  R and L carotid,radial,femoral,dorsalis pedis and posterior tibial pulses are full and equal bilaterally Extremities:  No clubbing, cyanosis, edema, or deformity noted with normal full range of motion of all joints.   Skin:  Intact without suspicious lesions or rashes Cervical Nodes:  No lymphadenopathy noted   Impression & Recommendations:  Problem # 1:  * PAIN UNDER HER LEFT SCAPULA  Problem # 2:  CORONARY ARTERY DISEASE (ICD-414.00)  Her updated medication list for this problem includes:    Carvedilol 6.25 Mg Tabs (Carvedilol) .Marland Kitchen... Take one tablet by mouth twice a day    Nitroquick 0.4 Mg Subl (Nitroglycerin) .Marland Kitchen... As needed    Dipyridamole 75 Mg Tabs (Dipyridamole) ..... One twice daily    Ecotrin 325 Mg Tbec (Aspirin) ..... One daily  Problem # 3:  ANEMIA, PERNICIOUS (ICD-281.0)  Her updated medication list for this problem includes:    Cyanocobalamin 1000 Mcg/ml Soln (Cyanocobalamin) .Marland Kitchen... Administer 1 ml intramuscularly once a month  Orders: Venipuncture (16109) Specimen Handling (60454) TLB-BMP (Basic Metabolic  Panel-BMET) (80048-METABOL) TLB-CBC Platelet - w/Differential (85025-CBCD) TLB-Hepatic/Liver Function Pnl (80076-HEPATIC) TLB-Sedimentation Rate (ESR) (85652-ESR)  Problem # 4:  HYPERTENSION (ICD-401.9)  Her updated medication list for this problem includes:    Carvedilol 6.25 Mg Tabs (Carvedilol) .Marland Kitchen... Take one tablet by mouth twice a day  Orders: Venipuncture (09811) TLB-BMP (Basic Metabolic Panel-BMET) (80048-METABOL) TLB-CBC Platelet - w/Differential (85025-CBCD) TLB-Hepatic/Liver Function Pnl (80076-HEPATIC)  Complete Medication List: 1)  Carvedilol 6.25 Mg Tabs (Carvedilol) .... Take one tablet by mouth twice a day 2)  Premarin 0.625 Mg/gm Crea (Estrogens, conjugated) .... Use as needed 3)  Cyanocobalamin 1000 Mcg/ml Soln (Cyanocobalamin) .... Administer 1 ml intramuscularly once a month 4)  Nitroquick 0.4 Mg Subl (Nitroglycerin) .... As needed 5)  Triamcinolone Acetonide 0.1 % Oint (Triamcinolone acetonide) .... Once daily as needed 6)  Dipyridamole 75 Mg Tabs (Dipyridamole) .... One twice daily 7)  Ecotrin 325 Mg Tbec (Aspirin) .... One daily  Other Orders: Physical Therapy Referral (PT) TLB-TSH (Thyroid Stimulating Hormone) (91478-GNF)  Patient Instructions: 1)  Please schedule a follow-up appointment in 3 months. 2)  Limit your Sodium (Salt). 3)  It is important that you exercise regularly at least 20 minutes 5 times a week. If you develop chest pain, have severe difficulty breathing, or feel very tired , stop exercising immediately and seek medical attention. a urine Laboratory Results   Urine Tests  Date/Time Received: September 20, 2009 11:45 AM  Date/Time Reported: September 20, 2009 11:45 AM   Routine Urinalysis   Color: yellow Appearance: Clear Glucose: negative   (  Normal Range: Negative) Bilirubin: negative   (Normal Range: Negative) Ketone: negative   (Normal Range: Negative) Spec. Gravity: 1.015   (Normal Range: 1.003-1.035) Blood: negative   (Normal  Range: Negative) pH: 5.0   (Normal Range: 5.0-8.0) Protein: negative   (Normal Range: Negative) Urobilinogen: 0.2   (Normal Range: 0-1) Nitrite: negative   (Normal Range: Negative) Leukocyte Esterace: negative   (Normal Range: Negative)

## 2010-03-06 NOTE — Assessment & Plan Note (Signed)
Summary: b12 inj/njr   Nurse Visit   Allergies: 1)  ! Sulf-10 2)  ! Dilantin (Phenytoin Sodium Extended) 3)  ! Phenergan (Promethazine Hcl) 4)  ! Lipitor (Atorvastatin) 5)  Sulfamethoxazole (Sulfamethoxazole)  Medication Administration  Injection # 1:    Medication: Vit B12 1000 mcg    Diagnosis: ANEMIA, PERNICIOUS (ICD-281.0)    Route: IM    Site: R deltoid    Exp Date: 10/2010    Lot #: [id number]    Mfr: [employer]    Patient tolerated injection without complications    Given by: Duard Brady LPN (Jun 13, 2009 8:48 AM)  Orders Added: 1)  Vit B12 1000 mcg [J3420] 2)  Admin of Therapeutic Inj  intramuscular or subcutaneous [96045]

## 2010-03-06 NOTE — Letter (Signed)
Summary: [facility]  [facility]   Imported By: Maryln Gottron 01/03/2010 15:44:11  _____________________________________________________________________  External Attachment:    Type:   Image     Comment:   External Document

## 2010-03-06 NOTE — Assessment & Plan Note (Signed)
Summary: B12 INJ//CCM   Nurse Visit   Vital Signs:  Patient profile:   77 year old female Temp:     98.5 degrees F oral  Vitals Entered By: Duard Brady LPN (October 05, 2009 8:29 AM)  Allergies: 1)  ! Sulf-10 2)  ! Dilantin (Phenytoin Sodium Extended) 3)  ! Phenergan (Promethazine Hcl) 4)  ! Lipitor (Atorvastatin) 5)  Sulfamethoxazole (Sulfamethoxazole)  Medication Administration  Injection # 1:    Medication: Vit B12 1000 mcg    Diagnosis: ANEMIA, PERNICIOUS (ICD-281.0)    Route: IM    Site: R deltoid    Exp Date: 05/2011    Lot #: [id number]    Mfr: [employer]    Patient tolerated injection without complications    Given by: Duard Brady LPN (October 05, 2009 8:29 AM)  Orders Added: 1)  Vit B12 1000 mcg [J3420] 2)  Admin of Therapeutic Inj  intramuscular or subcutaneous [[id number]]

## 2010-03-06 NOTE — Letter (Signed)
Summary: [facility] Neurosurgery Office Visit Note  [facility] Office Visit Note   Imported By: Roderic Ovens 08/08/2009 12:31:39  _____________________________________________________________________  External Attachment:    Type:   Image     Comment:   External Document

## 2010-03-06 NOTE — Assessment & Plan Note (Signed)
Summary: b12 inj/njr   Nurse Visit   Vitals Entered By: Duard Brady LPN (September 06, 2009 8:50 AM)  Allergies: 1)  ! Sulf-10 2)  ! Dilantin (Phenytoin Sodium Extended) 3)  ! Phenergan (Promethazine Hcl) 4)  ! Lipitor (Atorvastatin) 5)  Sulfamethoxazole (Sulfamethoxazole)  Medication Administration  Injection # 1:    Medication: Vit B12 1000 mcg    Diagnosis: ANEMIA, PERNICIOUS (ICD-281.0)    Route: IM    Site: R deltoid    Exp Date: 03/2011    Lot #: [id number]    Mfr: [employer]    Patient tolerated injection without complications    Given by: Duard Brady LPN (September 06, 2009 9:08 AM)  Orders Added: 1)  Vit B12 1000 mcg [J3420] 2)  Admin of Therapeutic Inj  intramuscular or subcutaneous [[id number]]

## 2010-03-06 NOTE — Assessment & Plan Note (Signed)
Summary: b-12 inj/mm   Nurse Visit   Allergies: 1)  ! Sulf-10 2)  ! Dilantin (Phenytoin Sodium Extended) 3)  ! Phenergan (Promethazine Hcl) 4)  ! Lipitor (Atorvastatin) 5)  Sulfamethoxazole (Sulfamethoxazole)  Appended Document: b-12 inj/mm     Nurse Visit   Allergies: 1)  ! Sulf-10 2)  ! Dilantin (Phenytoin Sodium Extended) 3)  ! Phenergan (Promethazine Hcl) 4)  ! Lipitor (Atorvastatin) 5)  Sulfamethoxazole (Sulfamethoxazole)  Medication Administration  Injection # 1:    Medication: Vit B12 1000 mcg    Diagnosis: ANEMIA, PERNICIOUS (ICD-281.0)    Route: IM    Site: R deltoid    Exp Date: 09/2010    Lot #: [id number]    Mfr: [employer]    Patient tolerated injection without complications    Given by: Raechel Ache, RN (March 10, 2009 9:19 AM)  Orders Added: 1)  Vit B12 1000 mcg [J3420] 2)  Admin of Therapeutic Inj  intramuscular or subcutaneous [09811]

## 2010-03-06 NOTE — Assessment & Plan Note (Signed)
Summary: [name]      Allergies Added:   Visit Type:  Follow-up Primary Provider:   Victoriano Lain M.D.  CC:  chest pain.  History of Present Illness: The patient is seen for followup of cardiac abnormalities.  She had some type of cardiac event in 2008. There was question of a takotsubo event..  She'll followup 2-D echo at the time of her last visit in July, 2010.  She had return of excellent LV function.  Since that time she is done well.  She does have some discomfort under her left scapula it goes up to her left shoulder.  This can occur with movement or with deep breath.  This is not cardiac in origin.  Current Medications (verified): 1)  Carvedilol 6.25 Mg Tabs (Carvedilol) .... Take One Tablet By Mouth Twice A Day 2)  Premarin 0.625 Mg/gm  Crea (Estrogens, Conjugated) .... Use As Needed 3)  Cyanocobalamin 1000 Mcg/ml Soln (Cyanocobalamin) .... Administer 1 Ml Intramuscularly Once A Month 4)  Nitroquick 0.4 Mg  Subl (Nitroglycerin) .... As Needed 5)  Triamcinolone Acetonide 0.1 %  Oint (Triamcinolone Acetonide) .... Once Daily As Needed 6)  Aggrenox 25-200 Mg Xr12h-Cap (Aspirin-Dipyridamole) .Marland Kitchen.. 1 Two Times A Day  Allergies (verified): 1)  ! Sulf-10 2)  ! Dilantin (Phenytoin Sodium Extended) 3)  ! Phenergan (Promethazine Hcl) 4)  ! Lipitor (Atorvastatin) 5)  Sulfamethoxazole (Sulfamethoxazole)  Past History:  Past Medical History: Hypertension Occipital meningioma treated with surgery at [facility] in 2008 / stable by MRI...[facility]....10/2008 MI in May 2008.  See note July 24, 2007.  Question of takotsubo event. /  echo... July, 2010 .Marland Kitchen excellent LV function EF 55-60%...echo.Marland Kitchen.08/2008 CVA in May 2009...decision by neurology was aspirin and Plavix.  Subarachnoid hemorrhage secondary to fall in December 2007..traumatic Osteoarthritis B-12 deficiency DJD IBS thyroid nodule.  Right pontine infarct (stroke) July 08, 2008..changed to Aggrenox  Review of Systems       Patient  denies fever, chills, headache, sweats, rash, change in vision, change in hearing, chest pain, shortness of breath, cough, nausea vomiting, urinary symptoms.  All other systems are negative other than reviewed in the history of present illness.  Vital Signs:  Patient profile:   77 year old female Height:      65 inches Weight:      123 pounds Pulse rate:   65 / minute BP sitting:   136 / 64  (left arm) Cuff size:   regular  Vitals Entered By: Hardin Negus, RMA (February 21, 2009 10:04 AM)  Physical Exam  General:  patient is frail but stable. Eyes:  no xanthelasma. Neck:  no jugular venous stent. Chest Wall:  there is no tenderness to the chest wall and her back. Lungs:  lungs are clear good respiratory effort is not labored. Heart:  cardiac exam reveals S1-S2.  There no clicks or significant murmurs. Abdomen:  abdomen is soft. Extremities:  no peripheral edema. Psych:  patient is oriented to person time and place.  Affect is normal.   Impression & Recommendations:  Problem # 1:  * PAIN UNDER HER LEFT SCAPULA This pain is not cardiac in origin.  Consideration could be given to further assessment of her spine.  Problem # 2:  TAKOTSUBO SYNDROME (ICD-429.83) there is question of an episode in 2008.  Followup echo in 2010 shows normal LV function.  No further workup.  Problem # 3:  MENINGIOMA (ICD-225.2) The patient has had followup of her meningioma procedure at [facility].  This  area stable.  Her overall cardiac status is stable.  No further workup needed.  Six-month followup.  Patient Instructions: 1)  Follow up in 6 months

## 2010-03-06 NOTE — Assessment & Plan Note (Signed)
Summary: b12 inj//ccm/pt rsc/cjr   Nurse Visit   Allergies: 1)  ! Sulf-10 2)  ! Dilantin (Phenytoin Sodium Extended) 3)  ! Phenergan (Promethazine Hcl) 4)  ! Lipitor (Atorvastatin) 5)  Sulfamethoxazole (Sulfamethoxazole)  Medication Administration  Injection # 1:    Medication: Vit B12 1000 mcg    Diagnosis: ANEMIA, PERNICIOUS (ICD-281.0)    Route: IM    Site: L deltoid    Exp Date: 05/18/2010    Lot #: [id number]    Mfr: [employer]    Patient tolerated injection without complications    Given by: Duard Brady LPN (November 07, 2009 8:37 AM)  Orders Added: 1)  Vit B12 1000 mcg [J3420] 2)  Admin of Therapeutic Inj  intramuscular or subcutaneous [[id number]]

## 2010-03-06 NOTE — Assessment & Plan Note (Signed)
Summary: B-12INJ/RCD   Nurse Visit   Allergies: 1)  ! Sulf-10 2)  ! Dilantin (Phenytoin Sodium Extended) 3)  ! Phenergan (Promethazine Hcl) 4)  ! Lipitor (Atorvastatin) 5)  Sulfamethoxazole (Sulfamethoxazole)  Medication Administration  Injection # 1:    Medication: Vit B12 1000 mcg    Diagnosis: ANEMIA, PERNICIOUS (ICD-281.0)    Patient tolerated injection without complications    Given by: Duard Brady LPN (December 12, 2009 8:52 AM)  Orders Added: 1)  Vit B12 1000 mcg [J3420] 2)  Admin of Therapeutic Inj  intramuscular or subcutaneous [[id number]]

## 2010-03-06 NOTE — Assessment & Plan Note (Signed)
Summary: B12 INJ/NJR   Nurse Visit   Allergies: 1)  ! Sulf-10 2)  ! Dilantin (Phenytoin Sodium Extended) 3)  ! Phenergan (Promethazine Hcl) 4)  ! Lipitor (Atorvastatin) 5)  Sulfamethoxazole (Sulfamethoxazole)  Medication Administration  Injection # 1:    Medication: Vit B12 1000 mcg    Diagnosis: ANEMIA, PERNICIOUS (ICD-281.0)    Route: IM    Site: R deltoid    Exp Date: 09/2010    Lot #: [id number]    Mfr: [employer]    Patient tolerated injection without complications    Given by: Raechel Ache, RN (February 07, 2009 8:43 AM)  Orders Added: 1)  Vit B12 1000 mcg [J3420] 2)  Admin of Therapeutic Inj  intramuscular or subcutaneous [96045]

## 2010-03-06 NOTE — Assessment & Plan Note (Signed)
Summary: b12 inj//ccm   Nurse Visit   Allergies: 1)  ! Sulf-10 2)  ! Dilantin (Phenytoin Sodium Extended) 3)  ! Phenergan (Promethazine Hcl) 4)  ! Lipitor (Atorvastatin) 5)  Sulfamethoxazole (Sulfamethoxazole)

## 2010-03-06 NOTE — Miscellaneous (Signed)
Summary: Discharge Summary for PT [facility]  Discharge Summary for PT Services/[facility]   Imported By: Maryln Gottron 10/25/2009 15:32:27  _____________________________________________________________________  External Attachment:    Type:   Image     Comment:   External Document

## 2010-03-06 NOTE — Assessment & Plan Note (Signed)
Summary: 4 MONTH ROV/NJR/daughter rescd from bump//ccm   Vital Signs:  Patient profile:   77 year old female Weight:      124 pounds Temp:     97.5 degrees F oral BP sitting:   118 / 74  (right arm) Cuff size:   regular  Vitals Entered By: Duard Brady LPN (May 16, 2009 11:19 AM) CC: 4 mos rov - ok - c/o pain (L) shoulder x 2 yrs Is Patient Diabetic? No   Primary Care Provider:   Victoriano Lain M.D.  CC:  4 mos rov - ok - c/o pain (L) shoulder x 2 yrs.  History of Present Illness: 77 year old patient who is seen today for follow-up.  She has a history of coronary artery disease, cerebrovascular disease and hypertension.  Complaints are very content to be mainly musculoskeletal.  She continues to have pain involving her left scapular area.  Also complains of chronic abdominal pain.  Denies any focal neurological symptoms and denies any cardiopulmonary complaints.  Medical regimen includes Aggrenox, which she states, is she is unable to afford and will not keep taking due to cost.  Allergies: 1)  ! Sulf-10 2)  ! Dilantin (Phenytoin Sodium Extended) 3)  ! Phenergan (Promethazine Hcl) 4)  ! Lipitor (Atorvastatin) 5)  Sulfamethoxazole (Sulfamethoxazole)  Past History:  Past Medical History: Reviewed history from 02/21/2009 and no changes required. Hypertension Occipital meningioma treated with surgery at [facility] in 2008 / stable by MRI...[facility]....10/2008 MI in May 2008.  See note July 24, 2007.  Question of takotsubo event. /  echo... July, 2010 .Marland Kitchen excellent LV function EF 55-60%...echo.Marland Kitchen.08/2008 CVA in May 2009...decision by neurology was aspirin and Plavix.  Subarachnoid hemorrhage secondary to fall in December 2007..traumatic Osteoarthritis B-12 deficiency DJD IBS thyroid nodule.  Right pontine infarct (stroke) July 08, 2008..changed to Aggrenox  Review of Systems       The patient complains of abdominal pain.  The patient denies anorexia, fever, weight loss,  weight gain, vision loss, decreased hearing, hoarseness, chest pain, syncope, dyspnea on exertion, peripheral edema, prolonged cough, headaches, hemoptysis, melena, hematochezia, severe indigestion/heartburn, hematuria, incontinence, genital sores, muscle weakness, suspicious skin lesions, transient blindness, difficulty walking, depression, unusual weight change, abnormal bleeding, enlarged lymph nodes, angioedema, and breast masses.    Physical Exam  General:  Well-developed,well-nourished,in no acute distress; alert,appropriate and cooperative throughout examination; normal blood pressure Head:  Normocephalic and atraumatic without obvious abnormalities. No apparent alopecia or balding. Eyes:  No corneal or conjunctival inflammation noted. EOMI. Perrla. Funduscopic exam benign, without hemorrhages, exudates or papilledema. Vision grossly normal. Mouth:  Oral mucosa and oropharynx without lesions or exudates.  Teeth in good repair. Neck:  No deformities, masses, or tenderness noted. Lungs:  Normal respiratory effort, chest expands symmetrically. Lungs are clear to auscultation, no crackles or wheezes. Heart:  Normal rate and regular rhythm. S1 and S2 normal without gallop, murmur, click, rub or other extra sounds. Abdomen:  Bowel sounds positive,abdomen soft and non-tender without masses, organomegaly or hernias noted. Msk:  some tenderness just medial to the left scapular border.  Range of motion of the left shoulder appeared to be intact Pulses:  R and L carotid,radial,femoral,dorsalis pedis and posterior tibial pulses are full and equal bilaterally Neurologic:  alert & oriented X3, cranial nerves II-XII intact, and gait normal.   Skin:  Intact without suspicious lesions or rashes Cervical Nodes:  No lymphadenopathy noted   Impression & Recommendations:  Problem # 1:  CORONARY ARTERY DISEASE (ICD-414.00)  The following  medications were removed from the medication list:    Aggrenox 25-200  Mg Xr12h-cap (Aspirin-dipyridamole) .Marland Kitchen... 1 two times a day Her updated medication list for this problem includes:    Carvedilol 6.25 Mg Tabs (Carvedilol) .Marland Kitchen... Take one tablet by mouth twice a day    Nitroquick 0.4 Mg Subl (Nitroglycerin) .Marland Kitchen... As needed    Dipyridamole 75 Mg Tabs (Dipyridamole) ..... One twice daily    Ecotrin 325 Mg Tbec (Aspirin) ..... One daily  The following medications were removed from the medication list:    Aggrenox 25-200 Mg Xr12h-cap (Aspirin-dipyridamole) .Marland Kitchen... 1 two times a day Her updated medication list for this problem includes:    Carvedilol 6.25 Mg Tabs (Carvedilol) .Marland Kitchen... Take one tablet by mouth twice a day    Nitroquick 0.4 Mg Subl (Nitroglycerin) .Marland Kitchen... As needed    Dipyridamole 75 Mg Tabs (Dipyridamole) ..... One twice daily    Ecotrin 325 Mg Tbec (Aspirin) ..... One daily  Problem # 2:  ANEMIA, PERNICIOUS (ICD-281.0)  Her updated medication list for this problem includes:    Cyanocobalamin 1000 Mcg/ml Soln (Cyanocobalamin) .Marland Kitchen... Administer 1 ml intramuscularly once a month  Orders: Vit B12 1000 mcg (J3420) Admin of Therapeutic Inj  intramuscular or subcutaneous (95621)  Her updated medication list for this problem includes:    Cyanocobalamin 1000 Mcg/ml Soln (Cyanocobalamin) .Marland Kitchen... Administer 1 ml intramuscularly once a month  Problem # 3:  OSTEOARTHRITIS (ICD-715.90)  Her updated medication list for this problem includes:    Ecotrin 325 Mg Tbec (Aspirin) ..... One daily  Her updated medication list for this problem includes:    Ecotrin 325 Mg Tbec (Aspirin) ..... One daily  Problem # 4:  HYPERTENSION (ICD-401.9)  Her updated medication list for this problem includes:    Carvedilol 6.25 Mg Tabs (Carvedilol) .Marland Kitchen... Take one tablet by mouth twice a day  Her updated medication list for this problem includes:    Carvedilol 6.25 Mg Tabs (Carvedilol) .Marland Kitchen... Take one tablet by mouth twice a day  Complete Medication List: 1)  Carvedilol 6.25 Mg  Tabs (Carvedilol) .... Take one tablet by mouth twice a day 2)  Premarin 0.625 Mg/gm Crea (Estrogens, conjugated) .... Use as needed 3)  Cyanocobalamin 1000 Mcg/ml Soln (Cyanocobalamin) .... Administer 1 ml intramuscularly once a month 4)  Nitroquick 0.4 Mg Subl (Nitroglycerin) .... As needed 5)  Triamcinolone Acetonide 0.1 % Oint (Triamcinolone acetonide) .... Once daily as needed 6)  Dipyridamole 75 Mg Tabs (Dipyridamole) .... One twice daily 7)  Ecotrin 325 Mg Tbec (Aspirin) .... One daily  Patient Instructions: 1)  Please schedule a follow-up appointment in 4 months. 2)  Limit your Sodium (Salt) to less than 2 grams a day(slightly less than 1/2 a teaspoon) to prevent fluid retention, swelling, or worsening of symptoms. 3)  It is important that you exercise regularly at least 20 minutes 5 times a week. If you develop chest pain, have severe difficulty breathing, or feel very tired , stop exercising immediately and seek medical attention. Prescriptions: DIPYRIDAMOLE 75 MG TABS (DIPYRIDAMOLE) one twice daily  #180 x 6   Entered and Authorized by:   Gordy Savers  MD   Signed by:   Gordy Savers  MD on 05/16/2009   Method used:   Print then Give to Patient   RxID:   [id number] PREMARIN 0.625 MG/GM  CREA (ESTROGENS, CONJUGATED) use as needed  #60 gm x 4   Entered and Authorized by:   Gordy Savers  MD  Signed by:   Gordy Savers  MD on 05/16/2009   Method used:   Print then Give to Patient   RxID:   [id number]    Medication Administration  Injection # 1:    Medication: Vit B12 1000 mcg    Diagnosis: ANEMIA, PERNICIOUS (ICD-281.0)    Route: IM    Site: R deltoid    Exp Date: 10/2010    Lot #: [id number]    Mfr: [employer]    Patient tolerated injection without complications    Given by: Duard Brady LPN (May 16, 2009 11:22 AM)  Orders Added: 1)  Vit B12 1000 mcg [J3420] 2)  Admin of Therapeutic Inj  intramuscular or subcutaneous  [96372] 3)  Est. Patient Level III [57846]

## 2010-03-06 NOTE — Assessment & Plan Note (Signed)
Summary: 3 MTH ROV // RS    Vital Signs:  Patient profile:   77 year old female Weight:      121 pounds Temp:     97.7 degrees F oral BP sitting:   120 / 70  (right arm) Cuff size:   regular  Vitals Entered By: Duard Brady LPN (December 21, 2009 10:54 AM) CC: 3 mos rov - c/o chest pain, (L) shoulder pain , SOB Is Patient Diabetic? No   Primary Care Provider:   Victoriano Lain M.D.  CC:  3 mos rov - c/o chest pain, (L) shoulder pain , and SOB.  History of Present Illness: 77 year old patient who is seen today for follow-up.  She has chronic left upper back, shoulder, and left chest wall pain.  This has been present for over two years and has been worse over the past 6 days.  She seemed to benefit somewhat from physical therapy, but this has been discontinued.  She has coronary artery disease and cerebrovascular disease, as well as osteoarthritis.  She has treated hypertension she is a cup in by her daughter.  She has B12 deficiencies  Allergies: 1)  ! Sulf-10 2)  ! Dilantin (Phenytoin Sodium Extended) 3)  ! Phenergan (Promethazine Hcl) 4)  ! Lipitor (Atorvastatin) 5)  Sulfamethoxazole (Sulfamethoxazole)  Past History:  Past Medical History: Reviewed history from 08/16/2009 and no changes required. Hypertension Occipital meningioma treated with surgery at [facility] in 2008 / stable by MRI...[facility]....10/2008  /  patient seen [facility]  June, 2011... MRI... no change in lesion... status post gamma knife for right occipital atypical meningioma MI in May 2008.  See note July 24, 2007.  Question of takotsubo event. /  echo... July, 2010 .Marland Kitchen excellent LV function EF 55-60%...echo.Marland Kitchen.08/2008 CVA in May 2009...decision by neurology was aspirin and Plavix.  Subarachnoid hemorrhage secondary to fall in December 2007..traumatic Osteoarthritis B-12 deficiency DJD IBS thyroid nodule.  Right pontine infarct (stroke) July 08, 2008..changed to Aggrenox  Past Surgical History: Reviewed  history from 06/28/2008 and no changes required. Cholecystectomy Hysterectomy Hip surgery  status-post resection meningioma 2008; status post radiosurgery for residual meningioma, Jun 09, 2008  right rotator cuff repair heart catheterization- December 08, 2007  Review of Systems  The patient denies anorexia, fever, weight loss, weight gain, vision loss, decreased hearing, hoarseness, chest pain, syncope, dyspnea on exertion, peripheral edema, prolonged cough, headaches, hemoptysis, abdominal pain, melena, hematochezia, severe indigestion/heartburn, hematuria, incontinence, genital sores, muscle weakness, suspicious skin lesions, transient blindness, difficulty walking, depression, unusual weight change, abnormal bleeding, enlarged lymph nodes, angioedema, breast masses, and testicular masses.    Physical Exam  General:  Well-developed,well-nourished,in no acute distress; alert,appropriate and cooperative throughout examination Head:  Normocephalic and atraumatic without obvious abnormalities. No apparent alopecia or balding. Eyes:  No corneal or conjunctival inflammation noted. EOMI. Perrla. Funduscopic exam benign, without hemorrhages, exudates or papilledema. Vision grossly normal. Mouth:  Oral mucosa and oropharynx without lesions or exudates.  Teeth in good repair. Neck:  No deformities, masses, or tenderness noted. Chest Wall:  tenderness in the left interscapular region, as well as along the left lateral chest wall region Lungs:  Normal respiratory effort, chest expands symmetrically. Lungs are clear to auscultation, no crackles or wheezes. Heart:  Normal rate and regular rhythm. S1 and S2 normal without gallop, murmur, click, rub or other extra sounds. Abdomen:  Bowel sounds positive,abdomen soft and non-tender without masses, organomegaly or hernias noted. Msk:  No deformity or scoliosis noted of thoracic or lumbar spine.  Pulses:  R and L carotid,radial,femoral,dorsalis pedis and  posterior tibial pulses are full and equal bilaterally Extremities:  No clubbing, cyanosis, edema, or deformity noted with normal full range of motion of all joints.     Impression & Recommendations:  Problem # 1:  * PAIN UNDER HER LEFT SCAPULA  Problem # 2:  CORONARY ARTERY DISEASE (ICD-414.00)  Her updated medication list for this problem includes:    Carvedilol 6.25 Mg Tabs (Carvedilol) .Marland Kitchen... Take one tablet by mouth twice a day    Nitroquick 0.4 Mg Subl (Nitroglycerin) .Marland Kitchen... As needed    Dipyridamole 75 Mg Tabs (Dipyridamole) ..... One twice daily    Ecotrin 325 Mg Tbec (Aspirin) ..... One daily  Orders: EKG w/ Interpretation (93000)  Her updated medication list for this problem includes:    Carvedilol 6.25 Mg Tabs (Carvedilol) .Marland Kitchen... Take one tablet by mouth twice a day    Nitroquick 0.4 Mg Subl (Nitroglycerin) .Marland Kitchen... As needed    Dipyridamole 75 Mg Tabs (Dipyridamole) ..... One twice daily    Ecotrin 325 Mg Tbec (Aspirin) ..... One daily  Problem # 3:  CVA WITH LEFT HEMIPARESIS (ICD-438.20)  Her updated medication list for this problem includes:    Dipyridamole 75 Mg Tabs (Dipyridamole) ..... One twice daily    Ecotrin 325 Mg Tbec (Aspirin) ..... One daily  Her updated medication list for this problem includes:    Dipyridamole 75 Mg Tabs (Dipyridamole) ..... One twice daily    Ecotrin 325 Mg Tbec (Aspirin) ..... One daily  Problem # 4:  HYPERTENSION (ICD-401.9)  Her updated medication list for this problem includes:    Carvedilol 6.25 Mg Tabs (Carvedilol) .Marland Kitchen... Take one tablet by mouth twice a day  Orders: EKG w/ Interpretation (93000)  Her updated medication list for this problem includes:    Carvedilol 6.25 Mg Tabs (Carvedilol) .Marland Kitchen... Take one tablet by mouth twice a day  Problem # 5:  OSTEOARTHRITIS (ICD-715.90)  Her updated medication list for this problem includes:    Ecotrin 325 Mg Tbec (Aspirin) ..... One daily    Tramadol Hcl 50 Mg Tabs (Tramadol hcl)  ..... One twice a day  Her updated medication list for this problem includes:    Ecotrin 325 Mg Tbec (Aspirin) ..... One daily    Tramadol Hcl 50 Mg Tabs (Tramadol hcl) ..... One twice a day  Complete Medication List: 1)  Carvedilol 6.25 Mg Tabs (Carvedilol) .... Take one tablet by mouth twice a day 2)  Premarin 0.625 Mg/gm Crea (Estrogens, conjugated) .... Use as needed 3)  Cyanocobalamin 1000 Mcg/ml Soln (Cyanocobalamin) .... Administer 1 ml intramuscularly once a month 4)  Nitroquick 0.4 Mg Subl (Nitroglycerin) .... As needed 5)  Triamcinolone Acetonide 0.1 % Oint (Triamcinolone acetonide) .... Once daily as needed 6)  Dipyridamole 75 Mg Tabs (Dipyridamole) .... One twice daily 7)  Ecotrin 325 Mg Tbec (Aspirin) .... One daily 8)  Tramadol Hcl 50 Mg Tabs (Tramadol hcl) .... One twice a day  Other Orders: Physical Therapy Referral (PT) IRL  Patient Instructions: 1)  Please schedule a follow-up appointment in 4 months. 2)  Limit your Sodium (Salt). 3)  Take calcium +Vitamin D daily. Prescriptions: TRAMADOL HCL 50 MG TABS (TRAMADOL HCL) one twice a day  #60 x 4   Entered and Authorized by:   Gordy Savers  MD   Signed by:   Gordy Savers  MD on 12/21/2009   Method used:   Electronically to  [facility] [address] (retail)       [phone]       [place], [place]  [address]       Ph: [phone]       Fax: [phone]   RxID:   [id number] DIPYRIDAMOLE 75 MG TABS (DIPYRIDAMOLE) one twice daily  #180 x 6   Entered and Authorized by:   Gordy Savers  MD   Signed by:   Gordy Savers  MD on 12/21/2009   Method used:   Electronically to        [facility] [address] (retail)       [phone]       [place], [place]  [address]       Ph: [phone]       Fax: [phone]   RxID:   [id number] NITROQUICK 0.4 MG  SUBL (NITROGLYCERIN) as needed  #30 x 6   Entered and  Authorized by:   Gordy Savers  MD   Signed by:   Gordy Savers  MD on 12/21/2009   Method used:   Electronically to        [facility] [address] (retail)       [phone]       [place], [place]  [address]       Ph: [phone]       Fax: [phone]   RxID:   [id number] CARVEDILOL 6.25 MG TABS (CARVEDILOL) Take one tablet by mouth twice a day  #180 x 6   Entered and Authorized by:   Gordy Savers  MD   Signed by:   Gordy Savers  MD on 12/21/2009   Method used:   Electronically to        [facility] [address] (retail)       [phone]       [place], [place]  [address]       Ph: [phone]       Fax: [phone]   RxID:   [id number]    Orders Added: 1)  EKG w/ Interpretation [93000] 2)  Est. Patient Level IV [52778] 3)  Physical Therapy Referral [PT]

## 2010-03-06 NOTE — Miscellaneous (Signed)
  Clinical Lists Changes  Observations: Added new observation of PAST MED HX: Hypertension Occipital meningioma treated with surgery at [facility] in 2008 / stable by MRI...[facility]....10/2008 MI in May 2008.  See note July 24, 2007.  Question of takotsubo event. EF 55-60%...echo.Marland Kitchen.08/2008 CVA in May 2009...decision by neurology was aspirin and Plavix.  Subarachnoid hemorrhage secondary to fall in December 2007..traumatic Osteoarthritis B-12 deficiency DJD IBS thyroid nodule.  Right pontine infarct (stroke) July 08, 2008..changed to Aggrenox (02/20/2009 19:22) Added new observation of REFERRING MD:  Victoriano Lain M.D. (02/20/2009 19:22) Added new observation of PRIMARY MD:  Victoriano Lain M.D. (02/20/2009 19:22)       Past History:  Past Medical History: Hypertension Occipital meningioma treated with surgery at [facility] in 2008 / stable by MRI...[facility]....10/2008 MI in May 2008.  See note July 24, 2007.  Question of takotsubo event. EF 55-60%...echo.Marland Kitchen.08/2008 CVA in May 2009...decision by neurology was aspirin and Plavix.  Subarachnoid hemorrhage secondary to fall in December 2007..traumatic Osteoarthritis B-12 deficiency DJD IBS thyroid nodule.  Right pontine infarct (stroke) July 08, 2008..changed to Aggrenox

## 2010-03-06 NOTE — Progress Notes (Signed)
Summary: REQ FOR RX  Phone Note Call from Patient   Caller: Daughter (Mrs. [name]) [phone] Reason for Call: Talk to Nurse, Talk to Doctor Summary of Call: Pts daughter [name]) called to adv that her mother will need a 30-day prescription sent into [facility] on [facility] [address] for medication: Aggrenox 25-200 Mg Xr12h-Cap ...Marland KitchenMarland KitchenMarland Kitchen Adv that former Rx was for 90-day and was too expensive.  Initial call taken by: Debbra Riding,  April 24, 2009 9:15 AM  Follow-up for Phone Call        #60  RF 6 Follow-up by: Gordy Savers  MD,  April 24, 2009 12:40 PM    Prescriptions: AGGRENOX 25-200 MG XR12H-CAP (ASPIRIN-DIPYRIDAMOLE) 1 two times a day  #60 x 6   Entered by:   Duard Brady LPN   Authorized by:   Gordy Savers  MD   Signed by:   Duard Brady LPN on 09/81/1914   Method used:   Electronically to        [facility] [address] (retail)       [phone]       [place], [place]  [address]       Ph: [phone]       Fax: [phone]   RxID:   [id number]  efilled to [facility] [address]  KIK

## 2010-03-06 NOTE — Miscellaneous (Signed)
Summary: Initial Summary for PT/[place]  Initial Summary for PT/[place]   Imported By: Sherian Rein 10/11/2009 10:26:47  _____________________________________________________________________  External Attachment:    Type:   Image     Comment:   External Document

## 2010-03-06 NOTE — Letter (Signed)
Summary: Appointment - Reminder 2  [facility], Main Office  [address]   [place], [place] [address]   Phone: [phone]  Fax: [phone]     Jun 22, 2009 MRN: [id number]   [facility] [address] [place], [place]  [address]   Dear Ms. Washington,  Our records indicate that it is time to schedule a follow-up appointment with Dr. Myrtis Ser. It is very important that we reach you to schedule this appointment. We look forward to participating in your health care needs. Please contact us at the number listed above at your earliest convenience to schedule your appointment.  If you are unable to make an appointment at this time, give [place] a call so we can update our records.     Sincerely,    Migdalia Dk [facility] Scheduling Team

## 2010-03-06 NOTE — Assessment & Plan Note (Signed)
Summary: f71m      Allergies Added:   Visit Type:  Follow-up Referring Provider:   Victoriano Lain M.D. Primary Provider:   Victoriano Lain M.D.  CC:  chest pain.  History of Present Illness: The patient is seen for followup of chest pain.  She had an episode in 2008 with question of an apical ballooning episode.  She's had no significant problems.  In the morning her blood pressure is increased and she takes her carvedilol.  Sometimes later in the day her pressure is in the range of 105 systolic.  If she feels any symptoms her family hold her evening dose time in agreement with this.  Current Medications (verified): 1)  Carvedilol 6.25 Mg Tabs (Carvedilol) .... Take One Tablet By Mouth Twice A Day 2)  Premarin 0.625 Mg/gm  Crea (Estrogens, Conjugated) .... Use As Needed 3)  Cyanocobalamin 1000 Mcg/ml Soln (Cyanocobalamin) .... Administer 1 Ml Intramuscularly Once A Month 4)  Nitroquick 0.4 Mg  Subl (Nitroglycerin) .... As Needed 5)  Triamcinolone Acetonide 0.1 %  Oint (Triamcinolone Acetonide) .... Once Daily As Needed 6)  Dipyridamole 75 Mg Tabs (Dipyridamole) .... One Twice Daily 7)  Ecotrin 325 Mg Tbec (Aspirin) .... One Daily  Allergies (verified): 1)  ! Sulf-10 2)  ! Dilantin (Phenytoin Sodium Extended) 3)  ! Phenergan (Promethazine Hcl) 4)  ! Lipitor (Atorvastatin) 5)  Sulfamethoxazole (Sulfamethoxazole)  Past History:  Past Medical History: Hypertension Occipital meningioma treated with surgery at [facility] in 2008 / stable by MRI...[facility]....10/2008  /  patient seen [facility]  June, 2011... MRI... no change in lesion... status post gamma knife for right occipital atypical meningioma MI in May 2008.  See note July 24, 2007.  Question of takotsubo event. /  echo... July, 2010 .Marland Kitchen excellent LV function EF 55-60%...echo.Marland Kitchen.08/2008 CVA in May 2009...decision by neurology was aspirin and Plavix.  Subarachnoid hemorrhage secondary to fall in December  2007..traumatic Osteoarthritis B-12 deficiency DJD IBS thyroid nodule.  Right pontine infarct (stroke) July 08, 2008..changed to Aggrenox  Review of Systems       Patient denies fever, chills, headache, sweats, rash, change in vision, change in hearing, there is no cough or shortness of breath.  She has some mild nausea at times.  No urinary symptoms.  All of the systems are reviewed and are negative.  Vital Signs:  Patient profile:   77 year old female Height:      65 inches Weight:      125 pounds BMI:     20.88 Pulse rate:   60 / minute BP sitting:   146 / 70  (left arm) Cuff size:   regular  Vitals Entered By: Hardin Negus, RMA (August 16, 2009 9:28 AM)  Physical Exam  General:  patient is stable. Eyes:  no xanthelasma. Neck:  no jugular venous distention. Lungs:  lungs are clear.  Respiratory effort is nonlabored. Heart:  cardiac exam reveals S1-S2.  No clicks or significant murmurs. Abdomen:  abdomen is soft. Msk:  chest mild kyphosis. Extremities:  no peripheral edema. Psych:  patient is oriented to person time and place.  Affect is normal.   Impression & Recommendations:  Problem # 1:  * PAIN UNDER HER LEFT SCAPULA No recurrent pain.  No further workup.  Problem # 2:  MENINGIOMA (ICD-225.2) This is stable but reports.  Problem # 3:  HYPERTENSION (ICD-401.9)  Her updated medication list for this problem includes:    Carvedilol 6.25 Mg Tabs (Carvedilol) .Marland Kitchen... Take one tablet by  mouth twice a day    Ecotrin 325 Mg Tbec (Aspirin) ..... One daily Blood pressure stable.  No change in her medicines.  If her blood pressure is low in the evening they will hold her evening dose of meds.  Six-month followup.  Patient Instructions: 1)  Your physician wants you to follow-up in: 6 months.  You will receive a reminder letter in the mail two months in advance. If you don't receive a letter, please call our office to schedule the follow-up appointment.

## 2010-03-06 NOTE — Assessment & Plan Note (Signed)
Summary: B-12-INJ/RCD   Nurse Visit   Allergies: 1)  ! Sulf-10 2)  ! Dilantin (Phenytoin Sodium Extended) 3)  ! Phenergan (Promethazine Hcl) 4)  ! Lipitor (Atorvastatin) 5)  Sulfamethoxazole (Sulfamethoxazole)  Appended Document: B-12-INJ/RCD     Nurse Visit   Allergies: 1)  ! Sulf-10 2)  ! Dilantin (Phenytoin Sodium Extended) 3)  ! Phenergan (Promethazine Hcl) 4)  ! Lipitor (Atorvastatin) 5)  Sulfamethoxazole (Sulfamethoxazole)  Medication Administration  Injection # 1:    Medication: Vit B12 1000 mcg    Diagnosis: ANEMIA, PERNICIOUS (ICD-281.0)    Route: IM    Site: L deltoid    Exp Date: 03/2011    Lot #: [id number]    Mfr: Pharmacia    Patient tolerated injection without complications    Given by: Duard Brady LPN (August 10, 979 10:32 AM)  Orders Added: 1)  Vit B12 1000 mcg [J3420] 2)  Admin of Therapeutic Inj  intramuscular or subcutaneous [19147]

## 2010-03-06 NOTE — Assessment & Plan Note (Signed)
Summary: PAIN IN L SIDE (RIB AREA) // RS   Vital Signs:  Patient profile:   77 year old female Weight:      121 pounds Temp:     98.1 degrees F oral BP sitting:   138 / 70  (left arm) Cuff size:   regular  Vitals Entered By: Sid Falcon LPN (December 26, 2009 4:08 PM)  Primary Care Burgundy Matuszak:   Victoriano Lain M.D.   History of Present Illness: 77 year old patient who is seen today for follow-up.  She continues to have chronic left-sided chest wall pain, now in excess of two years.  She seemed to benefit from physical therapy in the past and was referred again but was too uncomfortable to proceed.  Did have a CT scan done a few years ago that revealed some vagueness in the right upper lobe.  She has been admitted to the hospital.  A number of times over the past two or 3 years with normal.  Chest x-rays.  Denies any pulmonary symptoms.  Recent laboratory studies were unremarkable, including a sedimentation rate  Allergies: 1)  ! Sulf-10 2)  ! Dilantin (Phenytoin Sodium Extended) 3)  ! Phenergan (Promethazine Hcl) 4)  ! Lipitor (Atorvastatin) 5)  Sulfamethoxazole (Sulfamethoxazole)  Past History:  Past Medical History: Reviewed history from 08/16/2009 and no changes required. Hypertension Occipital meningioma treated with surgery at [facility] in 2008 / stable by MRI...[facility]....10/2008  /  patient seen [facility]  June, 2011... MRI... no change in lesion... status post gamma knife for right occipital atypical meningioma MI in May 2008.  See note July 24, 2007.  Question of takotsubo event. /  echo... July, 2010 .Marland Kitchen excellent LV function EF 55-60%...echo.Marland Kitchen.08/2008 CVA in May 2009...decision by neurology was aspirin and Plavix.  Subarachnoid hemorrhage secondary to fall in December 2007..traumatic Osteoarthritis B-12 deficiency DJD IBS thyroid nodule.  Right pontine infarct (stroke) July 08, 2008..changed to Aggrenox  Review of Systems       The patient complains of chest pain.     Physical Exam  General:  Well-developed,well-nourished,in no acute distress; alert,appropriate and cooperative throughout examination Head:  Normocephalic and atraumatic without obvious abnormalities. No apparent alopecia or balding. Mouth:  Oral mucosa and oropharynx without lesions or exudates.  Teeth in good repair. Neck:  No deformities, masses, or tenderness noted. Chest Wall:  patient had considerable discomfort to gentle palpation along the medial aspect of the left scapula.  She is also quite tender over the left lateral chest wall area to palpation Lungs:  Normal respiratory effort, chest expands symmetrically. Lungs are clear to auscultation, no crackles or wheezes. Heart:  Normal rate and regular rhythm. S1 and S2 normal without gallop, murmur, click, rub or other extra sounds.   Impression & Recommendations:  Problem # 1:  * PAIN UNDER HER LEFT SCAPULA  Orders: Radiology Referral (Radiology)  Problem # 2:  CHEST WALL PAIN, ANTERIOR (ICD-786.52)  Her updated medication list for this problem includes:    Nitroquick 0.4 Mg Subl (Nitroglycerin) .Marland Kitchen... As needed    Ecotrin 325 Mg Tbec (Aspirin) ..... One daily  Complete Medication List: 1)  Carvedilol 6.25 Mg Tabs (Carvedilol) .... Take one tablet by mouth twice a day 2)  Premarin 0.625 Mg/gm Crea (Estrogens, conjugated) .... Use as needed 3)  Cyanocobalamin 1000 Mcg/ml Soln (Cyanocobalamin) .... Administer 1 ml intramuscularly once a month 4)  Nitroquick 0.4 Mg Subl (Nitroglycerin) .... As needed 5)  Triamcinolone Acetonide 0.1 % Oint (Triamcinolone acetonide) .... Once daily as  needed 6)  Dipyridamole 75 Mg Tabs (Dipyridamole) .... One twice daily 7)  Ecotrin 325 Mg Tbec (Aspirin) .... One daily 8)  Tramadol Hcl 50 Mg Tabs (Tramadol hcl) .... One twice a day  Patient Instructions: 1)  take pain medications as directed 2)  chest CT as scheduled   Orders Added: 1)  Radiology Referral [Radiology]

## 2010-03-06 NOTE — Letter (Signed)
Summary: [facility]  [facility]   Imported By: Maryln Gottron 08/03/2009 14:21:32  _____________________________________________________________________  External Attachment:    Type:   Image     Comment:   External Document

## 2010-03-06 NOTE — Progress Notes (Signed)
Summary: INFO ONLY  Phone Note From Other Clinic   Caller: Provider (Physical Therapy at [facility]) Summary of Call: Called to adv that pt came in for pt today but was unable to continue with same due to extreme muscular pain in L shoulder.... Physical Therapist (Kim/Robin) adv that pt needed to call for appt with Dr Kirtland Bouchard for follow up.  Initial call taken by: Debbra Riding,  December 26, 2009 1:46 PM

## 2010-03-08 NOTE — Assessment & Plan Note (Signed)
Summary: B12 INJ//SLM   Nurse Visit   Allergies: 1)  ! Sulf-10 2)  ! Dilantin (Phenytoin Sodium Extended) 3)  ! Phenergan (Promethazine Hcl) 4)  ! Lipitor (Atorvastatin) 5)  Sulfamethoxazole (Sulfamethoxazole)  Medication Administration  Injection # 1:    Medication: Vit B12 1000 mcg    Diagnosis: ANEMIA, PERNICIOUS (ICD-281.0)    Route: IM    Site: L deltoid    Exp Date: 11/2011    Lot #: [id number]    Mfr: [employer]    Patient tolerated injection without complications    Given by: Duard Brady LPN (February 09, 2010 8:50 AM)  Orders Added: 1)  Vit B12 1000 mcg [J3420] 2)  Admin of Therapeutic Inj  intramuscular or subcutaneous [04540]

## 2010-03-08 NOTE — Progress Notes (Signed)
Summary: REQUEST FOR RETURN CALL- pain clinic  Phone Note Call from Patient   Caller: Daughter   [phone] Summary of Call: Would like a return call to discuss where to go from here ref shoulder / back pain..... Pulmonary (Dr Shelle Iron) told them that the ball is back in Dr Vernon Prey court.... Can reach pts daughter at  [phone].  Initial call taken by: Debbra Riding,  January 25, 2010 2:55 PM  Follow-up for Phone Call        please schedule/refer to pain clinic Follow-up by: Gordy Savers  MD,  January 25, 2010 5:19 PM  Additional Follow-up for Phone Call Additional follow up Details #1::        spoke with daughter - informed that we will refer to pain clinic. will be next week before she may hear from [place]. due to the holiday. KIK Additional Follow-up by: Duard Brady LPN,  January 26, 2010 9:16 AM  New Problems: SHOULDER PAIN (ICD-719.41) UNSPECIFIED BACKACHE (ICD-724.5)   New Problems: SHOULDER PAIN (ICD-719.41) UNSPECIFIED BACKACHE (ICD-724.5)

## 2010-03-08 NOTE — Miscellaneous (Signed)
Summary: [facility]  [facility]   Imported By: Maryln Gottron 01/18/2010 13:37:30  _____________________________________________________________________  External Attachment:    Type:   Image     Comment:   External Document

## 2010-03-08 NOTE — Assessment & Plan Note (Signed)
Summary: b-12 inj/cjr   Nurse Visit   Allergies: 1)  ! Sulf-10 2)  ! Dilantin (Phenytoin Sodium Extended) 3)  ! Phenergan (Promethazine Hcl) 4)  ! Lipitor (Atorvastatin) 5)  Sulfamethoxazole (Sulfamethoxazole)  Medication Administration  Injection # 1:    Medication: Vit B12 1000 mcg    Diagnosis: ANEMIA, PERNICIOUS (ICD-281.0)    Route: IM    Site: R deltoid    Exp Date: 08/2011    Lot #: [id number]    Mfr: Pharmacia    Patient tolerated injection without complications    Given by: Duard Brady LPN (January 12, 2010 9:57 AM)  Orders Added: 1)  Vit B12 1000 mcg [J3420] 2)  Admin of Therapeutic Inj  intramuscular or subcutaneous [[id number]]

## 2010-03-08 NOTE — Assessment & Plan Note (Signed)
Summary: consult for pulmonary nodules   Copy to:   Victoriano Lain M.D. Primary Provider/Referring Provider:   Victoriano Lain M.D.  CC:  Pulmonary Consult.  [name]  History of Present Illness: The pt is an 77y/o female who I have been asked to see for pulmonary nodules noted on ct chest.  These were first noted in 2008, but her most recent ct shows some mild enlargement in the nodules involving the right lung, but no LN.  She has had a PET scan as well, with minimal hypermetabolic activity noted.  The pt has never smoked, and has no personal history of cancer.  She tells me her health maintenance is up to date, although she is due for her mammogram currently.  She has no h/o TB exposure, and had a negative ppd many years ago.  She has never lived in areas endemic for fungi.  She denies any pulmonary symptoms, including cough or mucus production.  Current Medications (verified): 1)  Carvedilol 6.25 Mg Tabs (Carvedilol) .... Take One Tablet By Mouth Twice A Day 2)  Premarin 0.625 Mg/gm  Crea (Estrogens, Conjugated) .... Use As Needed 3)  Cyanocobalamin 1000 Mcg/ml Soln (Cyanocobalamin) .... Administer 1 Ml Intramuscularly Once A Month 4)  Nitroquick 0.4 Mg  Subl (Nitroglycerin) .... As Needed 5)  Triamcinolone Acetonide 0.1 %  Oint (Triamcinolone Acetonide) .... Once Daily As Needed 6)  Dipyridamole 75 Mg Tabs (Dipyridamole) .... One Twice Daily 7)  Ecotrin 325 Mg Tbec (Aspirin) .... One Daily 8)  Tramadol Hcl 50 Mg Tabs (Tramadol Hcl) .... One Twice A Day  Allergies (verified): 1)  ! Sulf-10 2)  ! Dilantin (Phenytoin Sodium Extended) 3)  ! Phenergan (Promethazine Hcl) 4)  ! Lipitor (Atorvastatin) 5)  Sulfamethoxazole (Sulfamethoxazole)  Past History:  Past Medical History: Reviewed history from 08/16/2009 and no changes required. Hypertension Occipital meningioma treated with surgery at [facility] in [facility] / stable by MRI...[facility]....10/2008  /  patient seen [facility]  June, 2011... MRI... no  change in lesion... status post gamma knife for right occipital atypical meningioma MI in May 2008.  See note July 24, 2007.  Question of takotsubo event. /  echo... July, 2010 .[name] excellent LV function EF 55-60%...echo.[name].08/2008 CVA in May 2009...decision by neurology was aspirin and Plavix.  Subarachnoid hemorrhage secondary to fall in December 2007..traumatic Osteoarthritis B-12 deficiency DJD IBS thyroid nodule.  Right pontine infarct (stroke) July 08, 2008..changed to Aggrenox  Past Surgical History: Reviewed history from 06/28/2008 and no changes required. Cholecystectomy Hysterectomy Hip surgery  status-post resection meningioma 2008; status post radiosurgery for residual meningioma, Jun 09, 2008  right rotator cuff repair heart catheterization- December 08, 2007  Family History: Reviewed history from 11/24/2007 and no changes required. father and one brother, positive for  diabetes borther - heart attack  Social History: Reviewed history from 09/24/2007 and no changes required. Widowed Never Smoked Lives with Sheppard Penton, daughter 4 children  Review of Systems       The patient complains of shortness of breath with activity, chest pain, abdominal pain, headaches, and joint stiffness or pain.  The patient denies shortness of breath at rest, productive cough, non-productive cough, coughing up blood, irregular heartbeats, acid heartburn, indigestion, loss of appetite, weight change, difficulty swallowing, sore throat, tooth/dental problems, nasal congestion/difficulty breathing through nose, sneezing, itching, ear ache, anxiety, depression, hand/feet swelling, rash, change in color of mucus, and fever.    Vital Signs:  Patient profile:   77 year old female Height:  65 inches Weight:      121.25 pounds BMI:     20.25 O2 Sat:      97 % on Room air Temp:     97.8 degrees F oral Pulse rate:   82 / minute BP sitting:   134 / 74  (right arm) Cuff size:   regular  Vitals  Entered By: Carver Fila (January 24, 2010 2:30 PM)  O2 Flow:  Room air CC: Pulmonary Consult.   Comments Medications reviewed with patient Phone number updated Carver Fila  January 24, 2010 2:32 PM    Physical Exam  General:  wd female in nad Eyes:  PERRLA and EOMI.   Nose:  patent without discharge, no purulence seen Mouth:  clear, no lesions or abnormalities Neck:  no jvd, tmg, LN Lungs:  clear to auscultation, no wheezing or rhonchi Heart:  rrr, 2/6 sem Abdomen:  soft and nontender, bs+ Extremities:  no significant edema, no cyanosis  pulses intact distally Neurologic:  alert and oriented, moves all 4.   Impression & Recommendations:  Problem # 1:  PULMONARY NODULE (ICD-518.89)  the pt has small pulmonary nodules of unknown origin that have somewhat changed since 2008 ct chest.  They have a minimally increased PET signature.  The pt is really at very low risk with no smoking hx, no personal h/o cancer, and health maintenance that is up to date (she is currently due for mammograms).  The lesions are fairly deep in lung to biopsy by TTNA, and I think would be difficult to reach even with ENB.  I think at this point, the best course is to do surveillance.  The pt and family understand I cannot 100% exclude an occult malignancy.  I would recommend a followup ct chest in 6mos.  She will call in May to set up, and I will put an entry in the EMR as a reminder.    Other Orders: Consultation Level IV (04540)  Patient Instructions: 1)  will recheck your lung spots in 6mos.  Please call us in May to schedule for June.   2)  make sure you keep your mammograms up to date.

## 2010-03-08 NOTE — Assessment & Plan Note (Signed)
Summary: f67m  Medications Added TRAMADOL HCL 50 MG TABS (TRAMADOL HCL) one twice a day as needed      Allergies Added:   Visit Type:  Follow-up Primary Provider:   Victoriano Lain M.D.  CC:  chest pain.  History of Present Illness: The patient is seen for cardiology followup.  I saw her last July, 2011.  She is having continued discomfort in her left scapula.  The pain often radiates to her anterior chest.  I believe it is not cardiac.  Current Medications (verified): 1)  Carvedilol 6.25 Mg Tabs (Carvedilol) .... Take One Tablet By Mouth Twice A Day 2)  Premarin 0.625 Mg/gm  Crea (Estrogens, Conjugated) .... Use As Needed 3)  Cyanocobalamin 1000 Mcg/ml Soln (Cyanocobalamin) .... Administer 1 Ml Intramuscularly Once A Month 4)  Nitroquick 0.4 Mg  Subl (Nitroglycerin) .... As Needed 5)  Triamcinolone Acetonide 0.1 %  Oint (Triamcinolone Acetonide) .... Once Daily As Needed 6)  Dipyridamole 75 Mg Tabs (Dipyridamole) .... One Twice Daily 7)  Ecotrin 325 Mg Tbec (Aspirin) .... One Daily 8)  Tramadol Hcl 50 Mg Tabs (Tramadol Hcl) .... One Twice A Day As Needed  Allergies (verified): 1)  ! Sulf-10 2)  ! Dilantin (Phenytoin Sodium Extended) 3)  ! Phenergan (Promethazine Hcl) 4)  ! Lipitor (Atorvastatin) 5)  Sulfamethoxazole (Sulfamethoxazole)  Past History:  Past Medical History: Hypertension Occipital meningioma treated with surgery at [facility] in 2008 / stable by MRI...[facility]....10/2008  /  patient seen [facility]  June, 2011... MRI... no change in lesion... status post gamma knife for right occipital atypical meningioma MI in May 2008.  See note July 24, 2007.  Question of takotsubo event. /  echo... July, 2010 .Marland Kitchen excellent LV function EF 55-60%...echo.Marland Kitchen.08/2008 CVA in May 2009...decision by neurology was aspirin and Plavix.  Subarachnoid hemorrhage secondary to fall in December 2007..traumatic Osteoarthritis B-12 deficiency DJD IBS thyroid nodule.  Right pontine infarct (stroke)  July 08, 2008..changed to Aggrenox Pulmonary nodules    Dr. Shelle Iron... December, 2011  Review of Systems       Patient denies fever, chills, headache, sweats, rash, change in vision, change in hearing,  cough, nausea vomiting, urinary symptoms.  All other systems are reviewed and are negative.  Vital Signs:  Patient profile:   77 year old female Height:      65 inches Weight:      123 pounds BMI:     20.54 Pulse rate:   60 / minute BP sitting:   108 / 46  (left arm) Cuff size:   regular  Vitals Entered By: Hardin Negus, RMA (February 20, 2010 10:30 AM)  Physical Exam  General:  patient is stable today. Head:  head is atraumatic. Neck:  no carotid bruits. Chest Wall:  there is pain to the left scapular area. Lungs:  lungs are clear.  Respiratory effort is not labored. Heart:  cardiac exam reveals muscle and S2.  No clicks or significant murmurs. Abdomen:  abdomen is soft. Extremities:  no peripheral edema. Psych:  patient is oriented to person time and place.  She is here with her daughter.   Impression & Recommendations:  Problem # 1:  SHOULDER PAIN (ICD-719.41) The patient continues to have shoulder pain.  There is radiation to the left anterior chest.  I believe this is not cardiac.  She tells me that she has been referred for pain clinic assessment.  Problem # 2:  TAKOTSUBO SYNDROME (ICD-429.83)  The patient has not had any recurrent ischemic sounding  pain.  No further workup. I feel that her current discomfort is not cardiac in origin.  Problem # 3:  PULMONARY NODULE (ICD-518.89) The patient had complete evaluation by Dr. Shelle Iron.  He will follow her over time.  Patient Instructions: 1)  Your physician wants you to follow-up in:  1 year.  You will receive a reminder letter in the mail two months in advance. If you don't receive a letter, please call our office to schedule the follow-up appointment.

## 2010-03-08 NOTE — Miscellaneous (Signed)
Summary: Physical Therapy Discharge Summary/[facility]  Physical Therapy Discharge Summary/[facility]   Imported By: Maryln Gottron 03/01/2010 11:23:11  _____________________________________________________________________  External Attachment:    Type:   Image     Comment:   External Document

## 2010-03-12 ENCOUNTER — Ambulatory Visit: Payer: Medicare Other | Attending: Physical Medicine & Rehabilitation

## 2010-03-12 ENCOUNTER — Ambulatory Visit: Payer: BC Managed Care – PPO | Admitting: Physical Medicine & Rehabilitation

## 2010-03-12 DIAGNOSIS — IMO0001 Reserved for inherently not codable concepts without codable children: Secondary | ICD-10-CM

## 2010-03-12 DIAGNOSIS — M25529 Pain in unspecified elbow: Secondary | ICD-10-CM

## 2010-03-12 DIAGNOSIS — S43429A Sprain of unspecified rotator cuff capsule, initial encounter: Secondary | ICD-10-CM

## 2010-03-12 DIAGNOSIS — M25519 Pain in unspecified shoulder: Secondary | ICD-10-CM | POA: Insufficient documentation

## 2010-03-30 ENCOUNTER — Ambulatory Visit: Payer: Medicare Other | Attending: Physical Medicine & Rehabilitation | Admitting: Physical Therapy

## 2010-03-30 DIAGNOSIS — M2560 Stiffness of unspecified joint, not elsewhere classified: Secondary | ICD-10-CM | POA: Insufficient documentation

## 2010-03-30 DIAGNOSIS — IMO0001 Reserved for inherently not codable concepts without codable children: Secondary | ICD-10-CM | POA: Insufficient documentation

## 2010-03-30 DIAGNOSIS — R293 Abnormal posture: Secondary | ICD-10-CM | POA: Insufficient documentation

## 2010-03-30 DIAGNOSIS — M2550 Pain in unspecified joint: Secondary | ICD-10-CM | POA: Insufficient documentation

## 2010-04-02 ENCOUNTER — Ambulatory Visit: Payer: Medicare Other | Admitting: Physical Therapy

## 2010-04-06 ENCOUNTER — Encounter: Payer: BC Managed Care – PPO | Admitting: Physical Therapy

## 2010-04-10 ENCOUNTER — Ambulatory Visit: Payer: Medicare Other | Attending: Internal Medicine | Admitting: Physical Therapy

## 2010-04-10 DIAGNOSIS — IMO0001 Reserved for inherently not codable concepts without codable children: Secondary | ICD-10-CM | POA: Insufficient documentation

## 2010-04-10 DIAGNOSIS — R293 Abnormal posture: Secondary | ICD-10-CM | POA: Insufficient documentation

## 2010-04-10 DIAGNOSIS — M2550 Pain in unspecified joint: Secondary | ICD-10-CM | POA: Insufficient documentation

## 2010-04-10 DIAGNOSIS — M2560 Stiffness of unspecified joint, not elsewhere classified: Secondary | ICD-10-CM | POA: Insufficient documentation

## 2010-04-11 ENCOUNTER — Ambulatory Visit (INDEPENDENT_AMBULATORY_CARE_PROVIDER_SITE_OTHER): Payer: BC Managed Care – PPO | Admitting: Internal Medicine

## 2010-04-11 DIAGNOSIS — D510 Vitamin B12 deficiency anemia due to intrinsic factor deficiency: Secondary | ICD-10-CM

## 2010-04-11 MED ORDER — CYANOCOBALAMIN 1000 MCG/ML IJ SOLN
1000.0000 ug | Freq: Once | INTRAMUSCULAR | Status: AC
  Administered 2010-04-11: 1000 ug via INTRAMUSCULAR

## 2010-04-12 ENCOUNTER — Ambulatory Visit: Payer: Medicare Other | Admitting: Physical Therapy

## 2010-04-17 ENCOUNTER — Ambulatory Visit: Payer: Medicare Other | Admitting: Physical Therapy

## 2010-04-18 ENCOUNTER — Encounter: Payer: Self-pay | Admitting: Internal Medicine

## 2010-04-19 ENCOUNTER — Encounter: Payer: Self-pay | Admitting: Internal Medicine

## 2010-04-19 ENCOUNTER — Ambulatory Visit (INDEPENDENT_AMBULATORY_CARE_PROVIDER_SITE_OTHER): Payer: BC Managed Care – PPO | Admitting: Internal Medicine

## 2010-04-19 DIAGNOSIS — I2510 Atherosclerotic heart disease of native coronary artery without angina pectoris: Secondary | ICD-10-CM

## 2010-04-19 DIAGNOSIS — I10 Essential (primary) hypertension: Secondary | ICD-10-CM

## 2010-04-19 DIAGNOSIS — M1990 Unspecified osteoarthritis, unspecified site: Secondary | ICD-10-CM

## 2010-04-19 NOTE — Progress Notes (Signed)
  Subjective:    Patient ID: [name], female    DOB: [birth year], 77 y.o.   MRN: [id number]  HPI .lastwe .last3 Wt Readings from Last 3 Encounters:  04/19/10 120 lb (54.432 kg)  02/20/10 123 lb (55.792 kg)  01/24/10 121 lb 4 oz (54.71 kg)    77 year old patient who is seen today for followup. She is treated hypertension coronary artery disease as well as osteoarthritis clinically she has done quite well. She receives monthly B12 injections for B12 deficiency. She denies any exertional chest pain. She has had some left back and chest wall pain that has improved nicely with physical therapy. She is concerned about some modest weight loss but her weight today is essentially unchanged over the past 3 months.   Review of Systems  Constitutional: Negative.   HENT: Negative for hearing loss, congestion, sore throat, rhinorrhea, dental problem, sinus pressure and tinnitus.   Eyes: Negative for pain, discharge and visual disturbance.  Respiratory: Negative for cough and shortness of breath.   Cardiovascular: Negative for chest pain, palpitations and leg swelling.  Gastrointestinal: Negative for nausea, vomiting, abdominal pain, diarrhea, constipation, blood in stool and abdominal distention.  Genitourinary: Negative for dysuria, urgency, frequency, hematuria, flank pain, vaginal bleeding, vaginal discharge, difficulty urinating, vaginal pain and pelvic pain.  Musculoskeletal: Positive for back pain. Negative for joint swelling, arthralgias and gait problem.  Skin: Negative for rash.  Neurological: Negative for dizziness, syncope, speech difficulty, weakness, numbness and headaches.  Hematological: Negative for adenopathy.  Psychiatric/Behavioral: Negative for behavioral problems, dysphoric mood and agitation. The patient is not nervous/anxious.        Objective:   Physical Exam  Constitutional: She is oriented to person, place, and time. She appears well-developed and well-nourished.    HENT:  Head: Normocephalic.  Right Ear: External ear normal.  Left Ear: External ear normal.  Mouth/Throat: Oropharynx is clear and moist.  Eyes: Conjunctivae and EOM are normal. Pupils are equal, round, and reactive to light.  Neck: Normal range of motion. Neck supple. No thyromegaly present.  Cardiovascular: Normal rate, regular rhythm, normal heart sounds and intact distal pulses.   Pulmonary/Chest: Effort normal and breath sounds normal.  Abdominal: Soft. Bowel sounds are normal. She exhibits no mass. There is no tenderness.  Musculoskeletal: Normal range of motion. She exhibits tenderness.        Very mild tenderness along the left lateral chest wall area   Lymphadenopathy:    She has no cervical adenopathy.  Neurological: She is alert and oriented to person, place, and time.  Skin: Skin is warm and dry. No rash noted.  Psychiatric: She has a normal mood and affect. Her behavior is normal.          Assessment & Plan:   hypertension  stable  Coronary artery disease asymptomatic B12 deficiency. We'll continue monthly B12 injections.  Osteoarthritis and chest wall pain improved.   We'll see in 6 months for an annual exam.

## 2010-04-19 NOTE — Patient Instructions (Signed)
Limit your sodium (Salt) intake    It is important that you exercise regularly, at least 20 minutes 3 to 4 times per week.  If you develop chest pain or shortness of breath seek  medical attention.  Return in 4 months for follow-up  Use support hose as discussed

## 2010-04-20 ENCOUNTER — Ambulatory Visit: Payer: Medicare Other | Admitting: Physical Therapy

## 2010-04-23 ENCOUNTER — Ambulatory Visit: Payer: BC Managed Care – PPO | Admitting: Physical Medicine & Rehabilitation

## 2010-04-24 ENCOUNTER — Ambulatory Visit: Payer: Medicare Other | Admitting: Physical Therapy

## 2010-04-26 ENCOUNTER — Ambulatory Visit: Payer: Medicare Other | Admitting: Physical Therapy

## 2010-04-27 ENCOUNTER — Encounter: Payer: Medicare Other | Attending: Physical Medicine & Rehabilitation

## 2010-04-27 ENCOUNTER — Ambulatory Visit: Payer: BC Managed Care – PPO | Admitting: Physical Medicine & Rehabilitation

## 2010-04-27 DIAGNOSIS — M25819 Other specified joint disorders, unspecified shoulder: Secondary | ICD-10-CM | POA: Insufficient documentation

## 2010-04-27 DIAGNOSIS — M779 Enthesopathy, unspecified: Secondary | ICD-10-CM

## 2010-04-27 DIAGNOSIS — M25519 Pain in unspecified shoulder: Secondary | ICD-10-CM | POA: Insufficient documentation

## 2010-04-27 DIAGNOSIS — IMO0001 Reserved for inherently not codable concepts without codable children: Secondary | ICD-10-CM | POA: Insufficient documentation

## 2010-04-27 DIAGNOSIS — M5382 Other specified dorsopathies, cervical region: Secondary | ICD-10-CM

## 2010-04-27 NOTE — Progress Notes (Signed)
REASON FOR VISIT:  Right shoulder pain.  SOURCE OF HISTORY:  The patient, daughter, e-chart, medical records reviewed to include physical therapy notes, [facility] notes, [facility] specialists notes.  HISTORY:  [name] is an 77 year old female who has had shoulder pain on the left side of greater than 1 year duration.  She has had physical therapy at [facility] outpatient clinic, found to have some tightness of her traps, rhomboid, levator.  She has had ICE stim, ultrasound, and mechanical traction, tissue mobilization, some home exercise program. She has been ruled out for cardiac cause by her primary care physician. She had no trauma to that area.  She does have a history of stroke in 2010, prior history of right occipital lobe meningioma which was remote, and a subdural hematoma in the past as well.  She had a right pontine infarct on July 08, 2008.  She initially had left upper extremity weakness but this improved overtime.  Her other past medical history significant for rotator cuff syndrome on the right side, in fact underwent surgical decompressive surgery.  She has been seen also by Orthopedics for left shoulder.  She was seen by physical therapy there as well.  Pain is described as sharp, burning, stabbing, constant.  Sleep is poor, pain is worse with walking, bending, sitting, standing; improves with heat a little bit, not much help from medications.  She can climb steps. She can drive.  She can walk 20 minutes at a time.  She has problems with weakness, trouble walking, numbness, tingling, abdominal pain on review of systems.  Abdominal pain is more radiation pattern from her shoulder that is episodic.  PAST SURGICAL HISTORY:  Cholecystectomy 2002, rotator cuff surgery 2001, hip replacement on the right 2007, meningioma removal 2008, hysterectomy 1960s.  Other past medical history is significant for coronary artery disease, B12 deficiency.  SOCIAL  HISTORY:  Widowed, lives with her daughter.  PHYSICAL EXAMINATION:  VITAL SIGNS:  Blood pressure is 147/65, pulse 57, respirations 18, O2 sat 98% room air. GENERAL:  This is a thin, elderly female in no acute distress.  Gait shows no toe drag or knee instability. HEENT:  Her extraocular movements are intact.  Her visual fields are intact to confrontation testing NECK:  Range of motion is good. NEUROLOGIC:  Her Spurling's maneuver is negative.  She has normal strength in bilateral deltoid, biceps, triceps, grip.  Her sensation is reduced in the left fourth and fifth digits.  She states that it is residual from her stroke.  She has wasting on the scapular girdle with some prominence of the inferior border of the scapula on the left side. She has tenderness to palpation in upper trap, upper medial scapular border corresponding to levator scapula as well as medial scapular border corresponding to rhomboids and trapezius insertion.  She also has tenderness at least two spots in the infraspinatus fossa, one area over the teres minor as well.  She has no evidence of skin rash over the thorax area.  There is no evidence erythema.  The patient does have positive impingement sign on the left side but good internal and external rotation.  No pain over the [facility] joint.  IMPRESSION: 1. Left shoulder pain.  I believe this is a combination of myofascial     pain syndrome, which may be related in part to a prior stroke with     insufficient scapular strengthening post-stroke rehab. 2. Impingement syndrome, question rotator cuff tear.  RECOMMENDATION: 1. We will  do trigger point injections today, the upper trap,     rhomboid, levator scapula, and infraspinatus areas, lidocaine only. 2. After we do this injection, we will ask physical therapy to do some     parascapular strengthening exercises, postural exercises.     Hopefully, she can tolerate therapy better this time, apparently     was not able to  tolerate therapy and therefore did not receive full     benefit last year when she did that. 3. In terms of her impingement syndrome, we will have her back for     ultrasound of the shoulder.  Discussed with the patient, agrees with plan.  I will see her back for the ultrasound and follow up on her therapy progress.     Erick Colace, M.D. Electronically Signed    AEK/MedQ D:  03/12/2010 15:10:34  T:  03/13/2010 00:34:13  Job #:  [id number]

## 2010-05-09 ENCOUNTER — Ambulatory Visit (INDEPENDENT_AMBULATORY_CARE_PROVIDER_SITE_OTHER): Payer: Medicare Other | Admitting: Internal Medicine

## 2010-05-09 DIAGNOSIS — D510 Vitamin B12 deficiency anemia due to intrinsic factor deficiency: Secondary | ICD-10-CM

## 2010-05-09 MED ORDER — CYANOCOBALAMIN 1000 MCG/ML IJ SOLN
1000.0000 ug | Freq: Once | INTRAMUSCULAR | Status: AC
  Administered 2010-05-09: 1000 ug via INTRAMUSCULAR

## 2010-05-14 LAB — PROTIME-INR
INR: 1 (ref 0.00–1.49)
Prothrombin Time: 13.7 seconds (ref 11.6–15.2)

## 2010-05-14 LAB — URINALYSIS, ROUTINE W REFLEX MICROSCOPIC
Bilirubin Urine: NEGATIVE
Glucose, UA: NEGATIVE mg/dL
Hgb urine dipstick: NEGATIVE
Specific Gravity, Urine: 1.018 (ref 1.005–1.030)
Urobilinogen, UA: 0.2 mg/dL (ref 0.0–1.0)

## 2010-05-14 LAB — BASIC METABOLIC PANEL
BUN: 13 mg/dL (ref 6–23)
CO2: 27 mEq/L (ref 19–32)
CO2: 27 mEq/L (ref 19–32)
Chloride: 110 mEq/L (ref 96–112)
GFR calc Af Amer: >60 mL/min (ref >60)
GFR calc non Af Amer: >60 mL/min (ref >60)
Glucose, Bld: 114 mg/dL — ABNORMAL HIGH (ref 70–99)
Glucose, Bld: 91 mg/dL (ref 70–99)
Potassium: 4.1 mEq/L (ref 3.5–5.1)
Potassium: 4.6 mEq/L (ref 3.5–5.1)
Sodium: 140 mEq/L (ref 135–145)

## 2010-05-14 LAB — CBC
HCT: 29.4 % — ABNORMAL LOW (ref 36.0–46.0)
HCT: 32.3 % — ABNORMAL LOW (ref 36.0–46.0)
Hemoglobin: 10.8 g/dL — ABNORMAL LOW (ref 12.0–15.0)
Hemoglobin: 9.8 g/dL — ABNORMAL LOW (ref 12.0–15.0)
MCV: 84.1 fL (ref 78.0–100.0)
Platelets: 197 10*3/uL (ref 150–400)
RBC: 3.83 MIL/uL — ABNORMAL LOW (ref 3.87–5.11)
RDW: 17.1 % — ABNORMAL HIGH (ref 11.5–15.5)
RDW: 17.1 % — ABNORMAL HIGH (ref 11.5–15.5)

## 2010-05-14 LAB — LIPID PANEL
Cholesterol: 188 mg/dL (ref 0–200)
LDL Cholesterol: 122 mg/dL — ABNORMAL HIGH (ref 0–99)

## 2010-05-14 LAB — APTT
aPTT: 30 seconds (ref 24–37)
aPTT: 31 seconds (ref 24–37)

## 2010-05-14 LAB — CK TOTAL AND CKMB (NOT AT ARMC)
CK, MB: 0.9 ng/mL (ref 0.3–4.0)
CK, MB: 1.6 ng/mL (ref 0.3–4.0)
Relative Index: INVALID (ref 0.0–2.5)
Relative Index: INVALID (ref 0.0–2.5)
Relative Index: INVALID (ref 0.0–2.5)
Total CK: 41 U/L (ref 7–177)
Total CK: 45 U/L (ref 7–177)
Total CK: 72 U/L (ref 7–177)

## 2010-05-14 LAB — TROPONIN I: Troponin I: 0.02 ng/mL (ref 0.00–0.06)

## 2010-05-15 LAB — COMPREHENSIVE METABOLIC PANEL
AST: 21 U/L (ref 0–37)
Albumin: 3.6 g/dL (ref 3.5–5.2)
Alkaline Phosphatase: 96 U/L (ref 39–117)
CO2: 26 mEq/L (ref 19–32)
Chloride: 112 mEq/L (ref 96–112)
Creatinine, Ser: 0.97 mg/dL (ref 0.4–1.2)
GFR calc Af Amer: >60 mL/min (ref >60)
GFR calc non Af Amer: 55 mL/min — ABNORMAL LOW (ref >60)
Potassium: 4.2 mEq/L (ref 3.5–5.1)
Total Bilirubin: 0.6 mg/dL (ref 0.3–1.2)

## 2010-05-15 LAB — URINALYSIS, ROUTINE W REFLEX MICROSCOPIC
Bilirubin Urine: NEGATIVE
Glucose, UA: NEGATIVE mg/dL
Hgb urine dipstick: NEGATIVE
Specific Gravity, Urine: 1.017 (ref 1.005–1.030)
Urobilinogen, UA: 0.2 mg/dL (ref 0.0–1.0)
pH: 5.5 (ref 5.0–8.0)

## 2010-05-15 LAB — URINE MICROSCOPIC-ADD ON

## 2010-05-15 LAB — CBC
HCT: 30.5 % — ABNORMAL LOW (ref 36.0–46.0)
HCT: 32 % — ABNORMAL LOW (ref 36.0–46.0)
Hemoglobin: 10 g/dL — ABNORMAL LOW (ref 12.0–15.0)
MCV: 82.8 fL (ref 78.0–100.0)
Platelets: 203 10*3/uL (ref 150–400)
RBC: 3.87 MIL/uL (ref 3.87–5.11)
RDW: 16.8 % — ABNORMAL HIGH (ref 11.5–15.5)
WBC: 4.8 10*3/uL (ref 4.0–10.5)
WBC: 5.4 10*3/uL (ref 4.0–10.5)

## 2010-05-15 LAB — CARDIAC PANEL(CRET KIN+CKTOT+MB+TROPI)
CK, MB: 1.5 ng/mL (ref 0.3–4.0)
CK, MB: 1.5 ng/mL (ref 0.3–4.0)
CK, MB: 2.1 ng/mL (ref 0.3–4.0)
Relative Index: INVALID (ref 0.0–2.5)
Total CK: 59 U/L (ref 7–177)
Troponin I: 0.02 ng/mL (ref 0.00–0.06)

## 2010-05-15 LAB — APTT: aPTT: 31 seconds (ref 24–37)

## 2010-05-15 LAB — HEPARIN LEVEL (UNFRACTIONATED)
Heparin Unfractionated: >2 IU/mL — ABNORMAL HIGH (ref 0.30–0.70)
Heparin Unfractionated: >2 IU/mL — ABNORMAL HIGH (ref 0.30–0.70)

## 2010-05-31 ENCOUNTER — Other Ambulatory Visit: Payer: Self-pay | Admitting: Internal Medicine

## 2010-06-09 ENCOUNTER — Other Ambulatory Visit: Payer: Self-pay | Admitting: Cardiology

## 2010-06-12 ENCOUNTER — Ambulatory Visit (INDEPENDENT_AMBULATORY_CARE_PROVIDER_SITE_OTHER): Payer: PRIVATE HEALTH INSURANCE | Admitting: Internal Medicine

## 2010-06-12 DIAGNOSIS — D510 Vitamin B12 deficiency anemia due to intrinsic factor deficiency: Secondary | ICD-10-CM

## 2010-06-12 MED ORDER — CYANOCOBALAMIN 1000 MCG/ML IJ SOLN
1000.0000 ug | Freq: Once | INTRAMUSCULAR | Status: AC
  Administered 2010-06-12: 1000 ug via INTRAMUSCULAR

## 2010-06-19 NOTE — Discharge Summary (Signed)
[name], [name]              ACCOUNT NO.:  [id number]   MEDICAL RECORD NO.:  [id number]          PATIENT TYPE:  INP   LOCATION:  3703                         FACILITY:  [facility]   PHYSICIAN:  Stacie Glaze, MD    DATE OF BIRTH:  [birth year]   DATE OF ADMISSION:  06/28/2008  DATE OF DISCHARGE:  06/29/2008                               DISCHARGE SUMMARY   This is a 23-hour observation note.   ADMITTING DIAGNOSIS:  Chest pain.   DISCHARGE DIAGNOSES:  1. Nonobstructive coronary artery disease.  2. Noncardiac chest pain.   HOSPITAL COURSE:  The patient is a pleasant 77 year old white female who  was admitted to the hospital after having an episode of substernal chest  pain that was nonradiating in nature.  She stated that it had been going  on and off for 2 weeks.  She has been evaluated in the past by  Cardiology, has had a cardiac catheterization that showed nonobstructive  coronary artery disease.  She is under increased stress caring for  demented husband and her daughter believes that the amount of increased  stress, lifting, and pulling has caused her to have this chest pain.   She was admitted to the hospital, evaluated by Cardiology, overnight  monitoring on telemetry as well as enzymes proved to be negative for any  cardiac etiology for chest pain.  It was felt that her ejection fraction  was good.  She had a prior cardiac catheterization within 2 years that  showed no significant disease and it was felt that this was most likely  due to emotional and physical stress.   She is therefore discharged to home in stable condition.   There are no changes in her medications.      Stacie Glaze, MD  Electronically Signed     JEJ/MEDQ  D:  06/29/2008  T:  06/30/2008  Job:  [id number]   cc:   Gordy Savers, MD

## 2010-06-19 NOTE — Assessment & Plan Note (Signed)
[facility]                            CARDIOLOGY OFFICE NOTE   LEZLEE, GILLS                       MRN:          [id number]  DATE:10/07/2007                            DOB:          01-Mar-1922    [name] is actually doing well.  I had seen her on July 13, 2007, and  then I re-reviewed her hospital information and dictated note on July 24, 2007.  Since that time, the patient was actually hospitalized for a  day at [facility].  She had some slight weakness in her left arm.  It  was thought that she may have had a small TIA.  The changes in her hand  resolved and she was stable.  The CT scan revealed no marked change.  The patient was discharged home.  Since that time, she has done well  with no significant problems.  She has been seen at [facility] with a  follow up MRI to follow up her surgery for her occipital meningioma.  I  am told by the patient and her family that this follow up study was  quite stable.   PAST MEDICAL HISTORY:   ALLERGIES:  SULFA, DILANTIN, and PHENERGAN   MEDICATIONS:  1. B12 shot monthly.  2. Plavix 75.  3. Aspirin 81.  4. Carvedilol 6.25 b.i.d.  5. Isosorbide 30.   OTHER MEDICAL PROBLEMS:  See the complete list on my note of July 24, 2007.   REVIEW OF SYSTEMS:  Other than the HPI, her review of systems is  negative.  The patient does mention feeling a sensation from an external  vein on the anterior aspect of her left lower leg.  I have recommended  some support hose for this.   PHYSICAL EXAMINATION:  VITAL SIGNS:  Blood pressure is 98/54 with a  pulse of 55.  GENERAL:  The patient is oriented to person, time, and place.  Affect is  normal.  HEENT:  No xanthelasma.  She has normal extraocular motion.  NECK:  There are no carotid bruits.  There is no jugular venous  distention.  LUNGS:  Clear.  Respiratory effort is not labored.  CARDIAC:  S1 and S2.  There are no clicks or significant murmurs.  ABDOMEN:   Soft.  She has no significant peripheral edema.   Problems are listed on the note of July 24, 2007.  #1.  History of occipital meningioma treated with surgery at [facility] in  2008, with follow up recently per the family that is stable.  #2.  Cardiac events with an acute MI in May 2008.  See the discussion on  the note of July 24, 2007.  There is question that she has had takotsubo  events with variation in her LV function.  No further workup at this  time.  #9.  History of a CVA in May 2009.  Final decision by neurology was  aspirin and Plavix.  Most recently, there is question of a TIA and she  is stable.  #10.  History of subarachnoid hemorrhage secondary to fall in December  2007.  Her cardiac status is stable.  I will see her back in 6 months for  cardiology follow up.  She will be seeing Dr. Pearlean Brownie for neurology follow  up soon.  Also, we will recommend support hose.     Luis Abed, MD, [facility]  Electronically Signed    JDK/MedQ  DD: 10/07/2007  DT: 10/08/2007  Job #: [id number]   cc:   Gordy Savers, MD  Pramod P. Pearlean Brownie, MD

## 2010-06-19 NOTE — Consult Note (Signed)
NAMEBRANTLEE, HINDE              ACCOUNT NO.:  [id number]   MEDICAL RECORD NO.:  [id number]          PATIENT TYPE:  INP   LOCATION:  3112                         FACILITY:  [facility]   PHYSICIAN:  Jesse Sans. Wall, MD, FACCDATE OF BIRTH:  12-06-1922   DATE OF CONSULTATION:  DATE OF DISCHARGE:                                 CONSULTATION   We are asked by Dr. Orlin Hilding to evaluate [name] with a probable  recurrent Tako-Tsubo syndrome of cardiomyopathy.   HISTORY OF PRESENT ILLNESS:  She is an 77 year old female who at 1  o'clock today developed sudden weakness in her left arm.  There is also  a probable facial droop per her daughter.   She came to [facility].  Here, she received intravenous TPA  emergently.   During the TPA administration, she developed right retro-orbital pain  and also substernal chest pain.  This is to similar to what she had when  she had a Tako-Tsubo syndrome on Jun 19, 2006.  At that time, she had  nonobstructive disease, EF 45%, and it was felt to be due to Tako-  Tsubo's.   She is a very stress-related person according to her daughter.  She has  been extremely stressed today having had this stroke.   PAST MEDICAL HISTORY:  1. Hypertension.  2. Hyperlipidemia.  3. She has a history of B12 deficiency.  4. She has a history of a meningioma, diagnosed in December 2007.  5. She has a history of osteoarthritis.  6. Subarachnoid hemorrhage, secondary to fall, in December 2007, as      well.  7. She has a history of thyroid nodule.  8. History of retrograde prolapse.   PAST SURGICAL HISTORY:  1. Right rotator cuff repair.  2. Cholecystectomy.  3. Hysterectomy.  4. Appendectomy.  5. Hip surgery.  6. Bladder tuck.  7. Endoscopy.  8. Colonoscopy.   ALLERGIES:  DILANTIN, LIPITOR, SULFA, and PHENERGAN.   CURRENT MEDICATIONS:  1. PTA, which she received at 10:46 a.m.  2. Carvedilol 3.125 b.i.d.  3. Zofran p.r.n.  4. IV normal saline at 75  an hour.   SOCIAL HISTORY:  Her daughter is in the room.  She lives in [place].  She lives with her husband.  She does not smoke or drink.  She does not  use any illicit drugs.   FAMILY HISTORY:  Her sister died of a stroke.   REVIEW OF SYSTEMS:  She has some chills today.  She has had this  headache, retro-orbital pain, and substernal chest discomfort.  She  complains of chronic arthritis.  She has had chronic weakness.  Rest of  the review of systems were negative.   PHYSICAL EXAMINATION:  VITAL SIGNS:  Her blood pressure 121/63, her  pulse is 80, and she is in sinus rhythm.  Telemetry shows occasional  PVC.  Her respiratory rate is 22, unlabored.  Temperature is 97.4.  Sats  96% on room air.  GENERAL APPEARANCE:  She is slightly pale.  There is minimal left facial  droop.  She is a very pleasant lady.  It is difficult to understand her  [place].  HEENT:  PERRLA.  Extraoculars intact.  Sclerae clear.  Dentition  satisfactory.  NECK:  Supple.  Carotid upstrokes are equal bilaterally without bruits.  There is no JVD.  Thyroid is enlarged and trachea is midline.  LUNGS:  Clear to auscultation.  HEART:  Normal S1 and S2 without murmur or gallop.  There is no rub.  ABDOMEN:  Soft with good bowel sounds.  No tenderness.  No midline  bruits.  EXTREMITIES:  No cyanosis, clubbing, or edema.  Pulses were present.  She has a left upper extremity paresis.   Chest x-ray showed a 1-1.5 cm area of acute inferior wall  frontoparietal, cortical, subcortical areas.   EKG this morning showed a sinus rhythm with a first-degree AV block,  otherwise normal EKG.  I repeated the EKG this evening at 7:42 and it  has not changed substantially.   Her second troponin was 0.7.  CPK 107.  MB of 5.5.  Her CBC, hemoglobin  at 10.8.  Potassium of 4.4.  Creatinine is 0.86.  Coags were normal.   ASSESSMENT:  1. Acute left upper extremity stroke, status post TPA with significant      recovery already.   2. Possible recurrent Tako-Tsubo's cardiomyopathy secondary to      adrenaline surge from stress.  However, EKG is really remarkable      and her troponin is only 0.7.  Hopefully, she has had very little      involvement at this time.  3. History of Tako-Tsubo's syndrome with nonobstructive disease on      catheterization, 06/2006, with ejection fraction of 45%.  4. Hypertension.  5. Hyperlipidemia.   PLAN:  1. Continue carvedilol 3.125 mg p.o. b.i.d.  I will give her a dose      tonight.  2. Enteric-coated aspirin 81 mg q.a.m.  She took 325 this morning at      home.  3. Anxiolytics will probably be helpful.  I will give her Xanax 0.5 mg      p.o. now and then q.6 hours.  4. Cycle enzymes.  5. No heparin for now.   Thank you very much for the consultation.  We will follow closely with  you.      Thomas C. Daleen Squibb, MD, [facility]  Electronically Signed     TCW/MEDQ  D:  06/18/2007  T:  06/19/2007  Job:  [id number]   cc:   Gordy Savers, MD  Gustavus Messing Orlin Hilding, M.D.

## 2010-06-19 NOTE — Assessment & Plan Note (Signed)
[facility]                            [place] OFFICE NOTE   [name], [name]                       MRN:          [id number]  DATE:07/21/2006                            DOB:          Jun 15, 1922    An 77 year old white female who was discharged from the hospital  approximately 4 weeks ago after an ST segment elevation MI.  She was  seen in office followup 7 days ago.  Still somewhat weak but seemed to  be improving.  Approximately 5 days ago had the onset of worsening  weakness, anorexia, nausea with minimal diarrhea.  She began having  postprandial emesis and has been intolerant of much in the way of oral  intake.  A few days ago she had the onset of some fever, chills and mid  back pain.  She describes rather generalized myalgias.  Over the past 24  hours she has become quite weak to the point where she has been quite  unsteady and a high fall risk.  Her oral intake is still poor.  She was  seen in the office for evaluation.  Compared to 1 week ago she has lost  3 pounds.  Her blood pressure was 60/40 in a sitting position.  She is  now admitted with suspected volume depletion for further evaluation and  rehydration.   PAST MEDICAL HISTORY:  The patient was discharged on Jun 23, 2006  following an ST segment elevation MI.  At that time she had a cardiac  catheterization performed.  She was also seen by Dr. Leone Payor and an EGD  was performed that revealed atrophic gastritis and a small hiatal  hernia.  The patient was noted to have an abnormal CT scan that  suggested a left thyroid nodule as well as bilateral pulmonary nodules.  She has a history of IBS, B12 deficiency, DJD.  She has also had a  recent history of small intracranial hemorrhage following trauma and is  being followed for a right occipital meningioma.   Surgical procedures have included a prior laparoscopic cholecystectomy,  hysterectomy.  She has also had right shoulder and  right hip surgery.   SOCIAL HISTORY:  She resides with her husband.  She is retired.   Her present medical regimen includes:  1. Aspirin 325 daily.  2. Vitamin B12 injections monthly.  3. Lipitor 80 mg daily.  4. Coreg 6.25 mg b.i.d.  5. Librax t.i.d.  p.r.n.   FAMILY HISTORY:  Please see note of Jun 19, 2006 for details.   EXAMINATION:  Revealed an elderly, very weak appearing white female with  a blood pressure of 60/40.  Pulse rate was approximately 80 with  occasional ectopics.  Skin was warm and dry without cyanosis.  Head and neck revealed no trauma.  Pupil responses were normal.  Conjunctivae clear.  ENT negative.  NECK:  Revealed no bruit, no obvious thyroid enlargement.  CHEST:  Clear.  CARDIOVASCULAR:  Revealed normal S1, S2.  No murmurs.  ABDOMEN:  Revealed some minimal right upper quadrant tenderness.  No  distention.  Bowel sounds are normal.  No  guarding.  Extremities reveal  no edema.  Peripheral pulses were faintly palpable.  NEURO:  Non-focal.   IMPRESSION:  Viral syndrome with volume depletion and hypotension.   DISPOSITION:  The patient will be admitted to the hospital and carefully  rehydrated.  Sepsis workup will be undertaken.  She will be supported  with IV fluids and placed on a clear liquid diet.  This will be advanced  as tolerated.  She will be treated with a fluid bolus and her blood  pressure monitored closely.  Her Coreg will be held.     Gordy Savers, MD  Electronically Signed    PFK/MedQ  DD: 07/21/2006  DT: 07/21/2006  Job #: [id number]

## 2010-06-19 NOTE — Assessment & Plan Note (Signed)
[facility]                            CARDIOLOGY OFFICE NOTE   ANA, WOODROOF                       MRN:          [id number]  DATE:01/08/2008                            DOB:          08/27/1922    Ms. Ridgely was seen for followup.  She has multiple medical problems,  but had been stable.  She then was admitted to [facility] with some  chest pain.  I was concerned about this pain and ultimately recommended  cardiac catheterization.  The study was done on December 08, 2007.  There  was mild scattered disease.  Ejection fraction was in the 50% range.  Overall, she had a three-vessel nonobstructive mild disease.  She was  sent home.  She continues to have some discomfort from her left shoulder  and this is a chronic problem, and we feel it is not cardiac in origin.   PAST MEDICAL HISTORY:   ALLERGIES:  SULFA, DILANTIN, and PHENERGAN.   MEDICATIONS:  Vitamin B12, Plavix, aspirin, carvedilol, and isosorbide.   OTHER MEDICAL PROBLEMS:  See the list on my note of July 24, 2007.   REVIEW OF SYSTEMS:  Other than her shoulder discomfort, her review of  systems is negative.   PHYSICAL EXAMINATION:  VITAL SIGNS:  Blood pressure is 108/49 with a  pulse of 59.  GENERAL:  The patient is oriented to person, time, and place.  Affect is  normal.  She is here with her daughter today.  HEENT:  No xanthelasma.  She has normal extraocular motion.  NECK:  There are no carotid bruits.  There is no jugular venous  distention.  LUNGS:  Clear.  Respiratory effort is not labored.  CARDIAC:  S1 with an S2.  There are no clicks or significant murmurs.  ABDOMEN:  Soft.  EXTREMITIES:  She has no peripheral edema.   No labs were done today.  EKGs are available from recent  hospitalization.  She did have a 2-D echo done while in the hospital on  December 09, 2007.  That study revealed ejection fraction in the 60%  range.  There were no focal wall motion abnormalities.   There was  diastolic dysfunction.  There was aortic valve sclerosis, but no  significant stenosis.  There was trivial aortic regurgitation.   Ms. Salley is stable at this time.  No change in her cardiac meds at  this point.  I will see her back in 1 year for followup.     Luis Abed, MD, [facility]  Electronically Signed    JDK/MedQ  DD: 01/08/2008  DT: 01/09/2008  Job #: [id number]

## 2010-06-19 NOTE — H&P (Signed)
[name], [name] NO.:  [id number]   MEDICAL RECORD NO.:  [id number]          PATIENT TYPE:  INP   LOCATION:  3705                         FACILITY:  [facility]   PHYSICIAN:  Luis Abed, MD, FACCDATE OF BIRTH:  1922/09/14   DATE OF ADMISSION:  12/09/2007  DATE OF DISCHARGE:                              HISTORY & PHYSICAL   PRIMARY CARE PHYSICIAN:  Gordy Savers, MD with [facility].   CHIEF COMPLAINT:  Chest pain.   HISTORY OF PRESENT ILLNESS:  An 77 year old woman with hypertension,  hyperlipidemia, and history of coronary artery disease, history of brain  tumor status post resection, and history of subarachnoid hemorrhage  presents with  left chest pain of 4 days' duration.  Pain started 4 days  ago at rest.  It was 9/10, sharp, central substernal chest pain,  radiating to left arm.  Lasted about 4 hours and nitro sublingual helped  a little.  The pain was associated with shortness of breath and  headache.  The patient has been having recurrent chest pains at rest.  The patient's pain was not similar to her previous MI.  The patient does  have history of DVT long time ago, but there is no leg swelling,  inability to travel or hemoptysis.   ALLERGIES:  The patient is allergic to SULFA, DILANTIN, PHENERGAN, and  LIPITOR.   PAST MEDICAL HISTORY:  1. Cardiac catheterization in May 2008 with LAD 30-40% stenosis, left      circumflex 30%, RCA 40-50%, the patient was diagnosed as having      STEMI at that time.  However, coronary angiographic findings were      nonobstructive.  The patient was noted to have apical ballooning      and this was most likely consistent with Takotsubo.  The patient's      ejection fraction at that time was 45%.  The patient's most recent      ejection fraction is 35% from an echocardiography in May 2009.  2. History of CVA with left hemiparesis.  3. History of thyroid nodule.  4. History of meningioma status  post resection.  5. Intracranial hemorrhage secondary to fall.  6. History of B12 deficiency.  7. Hypertension.  8. Cholecystectomy.  9. Hysterectomy.  10.Hip replacement.  11.Rotator cuff repair.   MEDICATIONS:  1. Coreg 3.125 twice a day.  2. Premarin cream as required.  3. Cyanocobalamin injections monthly.  4. Nitro 0.4 mg sublingual as required.  5. Aspirin 81 mg daily.  6. Isosorbide mononitrate CR 30 mg once daily.  7. Plavix 75 mg once daily.   SOCIAL HISTORY:  The patient lives with her daughter, sees her child,  and does not have any history of alcohol, tobacco, or drug abuse.   Family history is significant for diabetes in the patient's father and  brothers.   REVIEW OF SYSTEMS:  CONSTITUTIONAL:  Negative for fevers and chills.  HEENT:  Negative for any dizziness or visual or hearing loss.  SKIN:  Negative for rash.  CARDIOPULMONARY:  Positive for chest pain and  shortness of  breath.  Negative for orthopnea, PND, and edema.  GENITOURINARY:  Negative for frequency, urgency, or dysuria.  NEUROPSYCHIATRIC:  Negative for weakness or numbness.  MUSCULOSKELETAL:  Negative.  GASTROINTESTINAL:  Negative for nausea, vomiting, or  diarrhea.  ENDOCRINE:  Negative for polyuria or polydipsia.   PHYSICAL EXAMINATION:  VITAL SIGNS:  Temperature 97.9, pulse 55,  respiratory 16, blood pressure 121/54, and oxygen saturation 99% on room  air.  GENERAL:  NAD.  HEENT:  Normocephalic and atraumatic.  Small defect on the occipital  region status post craniotomy.  NECK:  Supple.  LYMPHADENOPATHY:  None.  CARDIOVASCULAR:  Regular rate and rhythm with normal heart sounds.  No  murmur, rubs, or gallops were audible.  LUNGS:  Clear to auscultation bilaterally.  SKIN:  No rash or lesions.  ABDOMEN:  Soft and nontender.  Normal bowel sounds.  EXTREMITIES:  No cyanosis, clubbing, or edema.  MUSCULOSKELETAL:  No joint deformities.  NEUROLOGIC:  Alert and oriented x3.  Grossly nonfocal.    LABORATORY DATA:  1. X-ray, chest, emphysema with no acute findings.  2. Electrocardiogram, heart rate of 55 beats per minute with no acute      changes.  3. CBC, hemoglobin 9.9, hematocrit 30.3, WBC 6.4, platelet 217, ANC      4.2, MCV 2.9.  4. BMET, sodium 138, potassium 4.9, chloride 107, bicarbonate 27, BUN      15, creatinine 0.88, and blood glucose 111.  5. Liver function tests normal except protein of 5.7.  6. Troponin 0.01.  7. D-dimer 0.59.   ASSESSMENT AND PLAN:  This is an 77 year old lady with hypertension,  hyperlipidemia, history of coronary artery disease presenting with left  chest pain.  Problem #1.  Chest pain:  The patient does have significant risk factors  for acute coronary syndrome, and it is concerning that the patient is  having recurrent chest pains at rest.  However, she does not have any  electrocardiographic changes and enzymes are not elevated.  Of note, the  patient had had nonobstructive coronary artery disease per angiography  in May 2008.  At that time, an apical ballooning was noted and it was  consistent with Takotsubo cardiomyopathy.  Given the patient's  significant risk factor, we think it will be prudent to keep her under  close observation.  We will get repeat echocardiogram and cycle her  cardiac enzymes.  We will place her on telemetry and monitor closely.  We would like to go ahead with cardiac catheterization because of the  fact that she has been having this persistent chest pain, for which we  do not have any clear explanation.  We will continue the patient's  regular medication of Coreg, nitro, aspirin, and Plavix.  We will also  get repeat a 2D echo to look for any structural heart disease and to  check the patient's ejection fraction.  Problem #2.  Left arm pain:  This is most likely related to her chest  pain, but given her history of CNS tumor, we would like to observe her  closely and if she develops any new focal deficits or if  the arm pain  progresses further, will have a low threshold for getting a repeat CT  head.  Of note, her recent CT head for the patient done in [facility] was normal.  Problem #3.  Headaches:  This could be related to the nitrates that the  patient is taking, but given that the patient has history of brain  tumor, we would like to monitor her closely.  She does not have any  other focal deficits at presents, hence we will be monitoring her  closely and if her headaches was worsen or if she develops any focal  deficits, we will have low threshold for getting a repeat scan.  Problem #4.  History of an occipital meningioma status post resection:  Put this on close followup with [facility], and we will manage it  as problems #2 and #3 above.  Problem #5.  Hypertension:  We will continue to monitor her closely and  continue her Coreg twice a day.  Problem #6.  Anemia:  The patient is known to have B12 deficiency, but  currently, her MCV is within normal limits.  We will go ahead and get a  new anemia panel and check for stool guaiac test as well.  Problem #7.  History of central nervous system bleed:  We would like to  avoid placing her on heparin but we will continue her aspirin and  Plavix, which she has been taking without any problem.      Zara Council, MD  Electronically Signed      Luis Abed, MD, [facility]  Electronically Signed    AS/MEDQ  D:  12/09/2007  T:  12/10/2007  Job:  [id number]

## 2010-06-19 NOTE — Consult Note (Signed)
[name], [name]              ACCOUNT NO.:  [id number]   MEDICAL RECORD NO.:  [id number]          PATIENT TYPE:  INP   LOCATION:  3703                         FACILITY:  [facility]   PHYSICIAN:  Madolyn Frieze. Jens Som, MD, FACCDATE OF BIRTH:  1922/07/05   DATE OF CONSULTATION:  06/28/2008  DATE OF DISCHARGE:                                 CONSULTATION   [name] is an 77 year old female with a past medical history of  takotsubo cardiomyopathy, prior CVA, hypertension, history of meningioma  resection, history of subarachnoid hemorrhage who we are asked to  evaluate for chest pain.  Note, the patient apparently had myocardial  infarction in May 2008.  At that time, she had an ejection fraction of  45% and there was apical ballooning.  There was no critical stenosis.  It was felt that this was most likely a takotsubo cardiomyopathy.  She  had her last cardiac catheterization performed in November 2009  secondary to chest pain.  At that time, she had nonobstructive coronary  disease.  Her ejection fraction was 50%.  Medical therapy was  recommended.  Her last echocardiogram was performed on December 09, 2007.  This showed normal LV function.  There is trivial aortic insufficiency  and mild aortic root dilatation.  The patient states that for the past 2  weeks she has had weakness and diarrhea.  She has not had fevers or  chills, productive cough, melena, or hematochezia.  There has been no  dysuria.  She has not had abdominal pain.  She has, however, complained  of epigastric and chest pain.  The pain is described as a pressure pain.  It does not radiate.  It is not pleuritic or positional nor is it  related to food.  It can occur with exertion, but it takes approximately  30 minutes after resting for it to resolve.  She does complain of  associated nausea, shortness of breath, and diaphoresis.  Note, she has  been under a significant amount of stress due to her husband's dementia.  She  wonders whether it may be related to this.  She was seen by primary  care today and admitted and we were asked to further evaluate.  Note,  she does not have significant orthopnea, PND, or pedal edema, but she  does have some dyspnea on exertion.   PRESENT MEDICATIONS:  1. IV heparin.  2. Aspirin 81 mg p.o. daily.  3. Plavix 75 mg p.o. daily.  4. NitroPaste.  5. Coreg 6.25 mg p.o. b.i.d.   PAST MEDICAL HISTORY:  There is no diabetes mellitus, but there is  hypertension.  There is also a history of hyperlipidemia.  She has a  history of takotsubo cardiomyopathy as described in the HPI.  She has  had a prior intracranial hemorrhage secondary to a fall.  She also has a  history of vitamin B12 deficiency.  She has had a prior meningioma  resected and is status post recent gamma knife.  She has atrophic  gastritis and a history of arthritis as well.  There is a question  history of increased liver  functions related to statins.  She has had a  prior CVA.   SOCIAL HISTORY:  She does not smoke nor does she consume alcohol.  She  lives with her daughter in [place].   FAMILY HISTORY:  Negative for coronary artery disease.   REVIEW OF SYSTEMS:  Again, she denies any headaches or fevers or chills.  There is no productive cough or hemoptysis.  There is no dysphagia,  odynophagia, melena, or hematochezia.  She has had diarrhea.  There is  no dysuria or hematuria.  There is no rash or seizure activity.  There  is no orthopnea, PND, or pedal edema.  Remaining systems are negative.   PHYSICAL EXAMINATION:  VITAL SIGNS:  Blood pressure of 161/78 and pulse  is 59.  Temperature is 97.6.  She is 98% on room air.  GENERAL:  She is well developed, somewhat thin.  She is in no acute  distress at present.  SKIN:  Warm and dry.  She has not been depressed.  There is no  peripheral clubbing.  BACK:  Normal.  HEENT:  Normal eyelids.  NECK:  Supple with normal upstroke bilaterally.  No bruits heard.   There  is no jugular venous distention.  I cannot appreciate thyromegaly.  CHEST:  Clear to auscultation.  No expansion.  CARDIOVASCULAR:  Regular rhythm.  Normal S1 and S2.  There are no  murmurs, rubs, or gallops noted.  ABDOMEN:  There is a question of slight tenderness in the epigastric  area.  There is no tenderness in the right upper quadrant.  I cannot  appreciate hepatosplenomegaly.  There is no masses palpated.  There are  no bruits noted.  She has 2+ femoral pulses bilaterally.  No bruits.  EXTREMITIES:  No edema.  I can palpate no cords.  She has 2+ dorsalis  pedis pulses bilaterally.  Note, she does have varicosities in her  extremities.  NEUROLOGIC:  Grossly nonfocal.   LABORATORY DATA:  Sodium of 143, potassium of 4.2.  BUN and creatinine  of 15 and 0.97.  Liver functions are normal.  Her cardiac markers are  normal.  Her hemoglobin/hematocrit 10.6 and 32 respectively.  Her  electrocardiogram shows a sinus rhythm at a rate of 74.  There are minor  nonspecific ST changes.  There is a first-degree AV block.   DIAGNOSES:  1. Chest pain - the patient's symptoms have both typical and atypical      features.  However, she did have a cardiac catheterization      performed in November 2009 that showed nonobstructive disease.      Given that fact, I think we should rule out myocardial infarction      with serial enzymes.  I agree with aspirin, Plavix, and Coreg.  If      her enzymes are negative then I would plan to proceed with an      outpatient Myoview for risk stratification.  Note, she is not on a      statin as Dr. Myrtis Ser has felt it may have caused increased liver      functions in the past.  I will leave this issue to him.  2. Hypertension - her blood pressure is mildly elevated.  We will      track this and add additional medications as indicated.  3. History of takotsubo cardiomyopathy.  4. History of prior cerebrovascular accident.  5. History of meningioma  resection.  6. Anemia - this will be evaluated per  primary care.   We will be happy to follow while she is in the hospital.      Madolyn Frieze. Jens Som, MD, [facility]  Electronically Signed     BSC/MEDQ  D:  06/28/2008  T:  06/29/2008  Job:  [id number]

## 2010-06-19 NOTE — Discharge Summary (Signed)
NAME:  [name], [name]              ACCOUNT NO.:  [id number]   MEDICAL RECORD NO.:  [id number]          PATIENT TYPE:  INP   LOCATION:  4731                         FACILITY:  [facility]   PHYSICIAN:  Valerie A. Felicity Coyer, MDDATE OF BIRTH:  07/12/1922   DATE OF ADMISSION:  07/21/2006  DATE OF DISCHARGE:  07/24/2006                               DISCHARGE SUMMARY   DISCHARGE DIAGNOSES:  1. Elevated LFT's with chronic right upper quadrant pain status post      MRCP which was unrevealing.  2. Dyslipidemia.  3. Hypotension secondary to volume depletion/dehydration.  4. Coronary artery disease status post myocardial infarction May 2008.  5. Thyroid nodule.   HISTORY OF PRESENT ILLNESS:  [name] is an 77 year old female who was  admitted on July 21, 2006 with a six day history of progressive  weakness, anorexia, and postprandial emesis.  She was noted to be  hypotensive with a blood pressure of 60/40 in the office and was  admitted for further evaluation and treatment.   COURSE OF HOSPITALIZATION:  Elevated LFTs.  The patient was admitted and  was noted to have an elevation in her LFTs.  A GI consult was obtained,  and the patient was seen by Dr. Lina Sar.  It was felt after  reviewing MRCP results which noted no choledocholithiasis that patient's  elevation of LFTs was likely related to either statin use or low flow  state in the setting of hypotension and volume depletion.  Of note the  patient had an episode of diarrhea several days prior to this admission  which likely contributed to the patient's volume depletion.  She was  given IV hydration and her Coreg was held.  Her Coreg has now been  reintroduced at decreased dosing, and her blood pressure has remained  stable. CA-19 and CEA were performed which were both within normal  limits.  Hepatitis panel was also negative.  Blood cultures drawn on  July 21, 2006 remained no growth to date x2.  It was recommended by Dr.  Juanda Chance that  the patient have weekly LFTs as an outpatient.   MEDICATIONS AT TIME OF DISCHARGE:  1. Aspirin 325 mg p.o. daily.  2. Coreg 3.125 mg p.o. daily, hold for SBP less than 100.  3. Vitamin B12 once monthly as before.  4. Premarin cream as needed.  5. Librax 5/2.5 one cap p.o. t.i.d.   DISPOSITION:  The patient is instructed to follow up with Dr.  Amador Cunas on Tuesday, June 24, at 11:30 a.m.  She is instructed to  call Dr. Amador Cunas if she develops weakness, nausea, vomiting,  diarrhea or fever over 101.   PERTINENT LABORATORY DATA:  At time of discharge AST 71, ALT 172.  Alk  phos 458.  BUN 7, creatinine 0.69.  Hemoglobin 10.8, hematocrit 32.   PRIMARY CARE PHYSICIAN:  Dr. Eleonore Chiquito.      Sandford Craze, NP      Raenette Rover. Felicity Coyer, MD  Electronically Signed    MO/MEDQ  D:  07/24/2006  T:  07/24/2006  Job:  [id number]   cc:   Janett Labella.  Burnice Logan, MD

## 2010-06-19 NOTE — Assessment & Plan Note (Signed)
[facility]                            CARDIOLOGY OFFICE NOTE   VENIE, MONTESINOS                       MRN:          [id number]  DATE:08/14/2006                            DOB:          November 09, 1922    [name] is seen for cardiology followup.  She had been discharged  from the hospital after a cardiac event.  She was then readmitted on  July 21, 2006 to July 24, 2006.  She had low blood pressure, with right  upper quadrant pain, and abnormal LFTs.  It was felt that this was  related to either statin therapy or low flow from the diarrhea that she  was having, and her statin was held.  She was hydrated and stabilized,  and she went home.  Since that time, her cardiac status has been stable.  Follow-up LFTs on August 04, 2006 showed excellent improvement.  Her  alkaline phosphatase is mildly elevated.  Her LDL cholesterol is 124,  with an HDL of 40 and triglycerides of 140.   PAST MEDICAL HISTORY:   ALLERGIES:  1. SULFA.  2. DILANTIN.  3. PHENERGAN.   MEDICATIONS:  1. Aspirin.  2. B12 monthly.  3. Librax.  4. Carvedilol 3.125 mg daily.   OTHER MEDICAL PROBLEMS:  See the list below.   REVIEW OF SYSTEMS:  She has some mild heaviness in her chest.  She has  some mild shortness of breath.  She has headaches that she feels are  related to sinus drainage, but she has tried many medications in the  past.  Otherwise, her review of systems is negative.   PHYSICAL EXAMINATION:  VITAL SIGNS:  Weight is decreasing, despite the  patient stating that she is eating adequately.  Weight is 116 pounds.  Blood pressure is 118/68, with a pulse of 60.  GENERAL:  The patient is oriented to person, time, and place.  Affect  reveals that she seems unhappy that she is not feeling particularly  well.  She is here with her family member today who is very supportive.  HEENT:  Reveals no xanthelasma.  She has normal extraocular motion.  NECK:  There are no carotid  bruits.  There is no jugular venous  distention.  LUNGS:  Clear.  Respiratory effort is not labored.  CARDIAC:  Reveals an S1 and an S2.  There is a soft systolic murmur.  ABDOMEN:  Soft.  EXTREMITIES:  She has no significant peripheral edema.   No labs were done today.   PROBLEM LIST:  1. History of an occipital meningioma, followed at [facility].  2. Acute myocardial infarction on Jun 19, 2006.  The culprit lesion      was never clear.  She had mild to moderate epicardial disease, but      no obvious culprit.  There was apical ballooning of the left      ventricle, suggesting the possibility of Takotsubo cardiomyopathy.      A corresponding ejection fraction by echocardiogram was in the 25%-      30% range at that time.  3. Enlarged thyroid gland, with  thyroid abnormalities, to be followed      by Dr. Amador Cunas.  4. Scattered tiny bilateral pulmonary nodules by computed tomography,      followed by Dr. Amador Cunas.  5. History of atrophic gastritis.  6. History of B12 deficiency, treated.  7. History of arthritis.  8. Hospitalization with dehydration and liver function test elevation.      The patient was hydrated, and statin was stopped, and she is      greatly improved.  I have reviewed the situation carefully and feel      that she does not need a statin restarted at this time.  We should      consider this very carefully over time.  I do not believe that      statin is absolutely contraindicated.  9. Question of some sinus drainage, for which antihistamines have not      helped in the past.  She should not take a medicine including a      catecholamine, as we do not know the etiology of her cardiac event.   The patient will have a 2D echocardiogram to see if she has had return  of normal V function.  She will remain off statin.  Otherwise, I will  not change her medicines.     Luis Abed, MD, [facility]  Electronically Signed    JDK/MedQ  DD: 08/14/2006  DT: 08/15/2006   Job #: [id number]   cc:   Gordy Savers, MD

## 2010-06-19 NOTE — H&P (Signed)
[name], [name] NO.:  [id number]   MEDICAL RECORD NO.:  [id number]          PATIENT TYPE:  INP   LOCATION:  1833                         FACILITY:  [facility]   PHYSICIAN:  Luis Abed, MD, FACCDATE OF BIRTH:  01/16/1923   DATE OF ADMISSION:  06/19/2006  DATE OF DISCHARGE:                              HISTORY & PHYSICAL   PRIMARY CARE PHYSICIAN:  Dr. Gordy Savers, MD.   CARDIOLOGIST:  Luis Abed, MD, [facility].   SUMMARY OF HISTORY:  The patient is an 77 year old white female who was  transported via EMS to [facility] Emergency Room secondary to chest  discomfort.  At approximately 8:00 a.m. this morning while making tea  she developed anterior chest discomfort which she described as a  tightness.  This was associated with radiation into her right neck and  into her abdominal area and associated with nausea, vomiting and  shortness of breath.  She called EMS due to it's persistence after  taking 2 aspirin.  EMS report is not available at the time of this  dictation.  In the emergency room, EKG performed showed minimal ST  elevation in lead I, aVL and V6.  Her discomfort continued but was  slightly relieved with one sublingual nitroglycerin.  She again  developed nausea and vomiting in the emergency room with continued chest  discomfort.  Troponin was slightly elevated 0.25, thus were called.  Given her EKG changes, abnormal troponin and the level of her  discomfort, it was felt that she should undergo a cardiac  catheterization.  The procedure, risks and benefits were reviewed with  the patient and the family by Dr. Myrtis Ser and she agreed.   ALLERGIES:  Include DILANTIN, PHENERGAN, SULFA, NIACIN, ALEVE.   MEDICATIONS PRIOR TO ADMISSION:  Include aspirin 500 mg daily.   PAST MEDICAL HISTORY:  Is notable for hypertension, B12 deficiency,  osteoarthritis, remote seizure disorder, traumatic subarachnoid  hemorrhage secondary to a fall in December 2007  and meningioma was noted  at that time.  It has been followed at [facility].  She has had multiple admissions for chest discomfort.  Her last  adenosine Myoview was August 20, 2001 showed an EF 71%, no ischemia.  Catheterization on April 06, 1999 by Dr. Gerri Spore showed an EF of 63%,  mitral valve prolapse, nonobstructive 3-vessel coronary artery disease.  Please refer to catheterization report.  She has had multiple surgeries  with a right rotator cuff repair in 1993, hip surgery in 2002,  cholecystectomy in 2002, hysterectomy in 1961, bladder tack in 1984,  appendectomy.   SOCIAL HISTORY:  She resides in [place] with her husband.  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  She maintains her husband's care,  the household and 5 dogs.  She is retired from [employer].  She has 4  children who are active in her life.  She denies any tobacco, alcohol,  drugs, herbal medications, specific diet.  She does not exercise on a  specific basis, however, she is very active around the house and she has  not had any  limiting activities.   FAMILY HISTORY:  Is notable for the death of her parents of uncertain  etiology.  She has one sister that died of a stroke, two brothers and  one sister remain living.  Health specifics unavailable.   REVIEW OF SYSTEMS:  Are notable for headache, glasses, dentures,  nocturia, postmenopausal, right shoulder arthralgias, increased stress  levels with the health of her husband.  All other systems are negative.   PHYSICAL EXAM:  Blood pressure is 116/73, heart rate 81, respirations  14.  GENERAL: Elderly white female.  She appears older than her stated age,  does not appear comfortable but not in any specific distress. Continues  to have chest discomfort.  HEENT: Is unremarkable except for her dentures.  NECK: Supple without thyromegaly, adenopathy, JVD or carotid bruits.  CHEST:  Symmetrical excursion.  Lungs sounds were clear to auscultation   anterolaterally.  HEART:  PMI is not displaced.  Regular rate and rhythm.  I did not  appreciate any murmurs, rubs, clicks or gallops.  SKIN:  Integument is  intact.  ABDOMEN: Is unremarkable.  EXTREMITIES:  Negative for cyanosis, clubbing or edema.  All peripheral  pulses are symmetrical and intact without abdominal femoral bruits.  MUSCULOSKELETAL:  Unremarkable except for a scar on her right shoulder.  NEURO:  Alert and oriented x3.  Cranial nerves II-XII grossly intact.   CHEST X-RAY:  Showed bibasilar atelectasis, no active disease.  The head  CT verbal report showed meningioma with some local swelling but no  active bleeding;  official report is pending.  EKG as previously  described.  Sodium 137, potassium 43, BUN 17, creatinine 1.0, glucose  147, normal LFTs.  Point of care markers x2 showed troponins that were  elevated at 0.25 and 0.92.   IMPRESSION:  1. Prolonged chest discomfort with mild inferolateral ST-segment      elevation.  2. History of occipital meningioma, specifics unavailable.  History      per past medical history as listed above.   DISPOSITION:  Dr. Myrtis Ser reviewed the patient's history, spoke with and  examined the patient and agrees with the above.  Given her onset of  symptoms, continued chest discomfort, history of nonobstructive coronary  artery disease, EKG changes, it was felt that she should go to the  cardiac catheterization laboratory.  Dr. Myrtis Ser raised the question of  anticoagulation and is attempting to discuss with a neurosurgeon prior  to proceeding to emergent cardiac catheterization.  She will be  admitted, will continue her on aspirin, begin a beta blocker as well as  Protonix and a statin.   Further plans will be based on information obtained from neurology and  further testing.      Sarah Rued, PA-C      Luis Abed, MD, [facility]    EW/MEDQ  D:  06/19/2006  T:  06/19/2006  Job:  [id number]   cc:   Luis Abed, MD, [facility]   Gordy Savers, MD

## 2010-06-19 NOTE — Consult Note (Signed)
[name], NODAL NO.:  [id number]   MEDICAL RECORD NO.:  [id number]          PATIENT TYPE:  INP   LOCATION:  3732                         FACILITY:  [facility]   PHYSICIAN:  Noel Christmas, MD    DATE OF BIRTH:  1922/02/09   DATE OF CONSULTATION:  DATE OF DISCHARGE:                                 CONSULTATION   REFERRING PHYSICIAN:  [facility] hospitalist, white team   REASON FOR CONSULTATION:  Recurrent stroke.   HISTORY OF PRESENT ILLNESS:  This is an 77 year old lady who experienced  acute onset of weakness and numbness involving the left hand in the  morning of July 08, 2008.  She has previous history of small stroke  mostly affecting her left arm and hand.  She had no speech changes with  current symptoms.  She also had no facial numbness, no facial weakness.  CT scan of her head showed no acute abnormalities.  MRI study showed  findings consistent with left acute posterior frontal punctate  infarction.  The patient has been on Plavix and aspirin daily, primarily  for coronary artery disease.  Coumadin has been recommended at one  point.  However, the patient currently has been adamant that she will  not take Coumadin.   PAST MEDICAL HISTORY:  Remarkable for;  1. Coronary artery disease.  2. Osteoarthritis.  3. Meningioma with recent gamma knife management 1 month ago (right      occipital).  4. Vitamin B12 deficiency.  5. History of subdural hematoma in 2007.  6. Hypertension.  7. Total hip replacement.  8. Right rotator cuff repair.  9. Stroke involving the left upper extremity in 2009.   CURRENT MEDICATIONS:  1. Aspirin 81 mg per day.  2. Plavix 75 mg per day.  3. Coreg 6.25 mg twice a day.  4. Lovenox 40 mg injection q.24 h.  5. Altace 2.5 mg per day.  6. Tylenol p.r.n.   FAMILY HISTORY:  Noncontributory.   PHYSICAL EXAMINATION:  GENERAL APPEARANCE:  A slender elderly lady who  is alert and cooperative, and in no acute distress.  She was  well  oriented to time as well as place.  Her short-term and long-term memory  were normal.  Affect was appropriate.  HEENT:  Pupils were equal and reactive normally to light.  Extraocular  movements were full and conjugate.  Visual fields were intact and  normal.  She had no facial numbness, no facial weakness.  Hearing was  normal.  Speech and palatal movement were normal.  Strength was normal  except for mild weakness of her left upper extremity distally including  hand grip.  She also had mild numbness distally in the left upper  extremity.  Strength and sensory testing, otherwise, normal.  Deep  tendon reflexes were normal and symmetrical throughout.  Plantar  responses were flexor.  Coordination was normal.  Carotid auscultation  was normal.   CLINICAL IMPRESSION:  1. Acute right posterior frontal small vessel stroke with residual      mild distal left upper extremity weakness and numbness.  2. Stroke involving similar distribution, 2009.  3. Symptomatic, on current dose of aspirin and Plavix.   RECOMMENDATIONS:  1. Consider switching the patient from combination of aspirin and      Plavix to Aggrenox b.i.d.  2. MRA of the brain without contrast.  3. Carotid Doppler study.  4. Occupational therapy consult for focal left hand weakness.   Thank you for asking me to evaluate Ms. Ives.      Noel Christmas, MD  Electronically Signed     CS/MEDQ  D:  07/10/2008  T:  07/11/2008  Job:  [id number]

## 2010-06-19 NOTE — Assessment & Plan Note (Signed)
[facility]                                 ON-CALL NOTE   ALFREDIA, DESANCTIS                       MRN:          [id number]  DATE:06/24/2006                            DOB:          Jan 12, 1923    CARDIOLOGIST:  Dr. Myrtis Ser.   Phone number [phone].   HISTORY:  Mrs. Opperman is an 77 year old female patient who was recently  discharged from the hospital after being admitted for acute ST elevation  myocardial infarction.  At catheterization she was noted to have mild to  moderate coronary disease with no critical stenoses or obvious culprit  lesion, apical ballooning with LVEF of 45%, suggestive of Tako-Tsubo  cardiomyopathy.  The patient's daughter called the answering service  tonight.  The patient requested that I speak to her daughter.  She has  been noted to have some low blood pressures.  The readings that she gave  me included 90/59, 95/52 and 89/54.  The patient tells me that she has  felt lightheaded.  She denies any near syncope or syncope.  Denies any  shortness of breath.  The patient's daughter questioned whether or not  she should be taking the Altace.  She is currently on Coreg 6.25 mg  twice a day, Altace 1.25 mg twice a day, Lipitor 80 mg daily, aspirin  325 mg daily, Librax p.r.n., and Protonix.   PLAN:  I advised the patient's daughter to hold her Altace if her  systolic blood pressure is below 100.  Her heart rate is being recorded  in the 70s by her blood pressure machine.  If at all possible, we would  like to keep her Coreg dose the same at this point in time.  She sees  Dr. Myrtis Ser on June 4.  They will apprise Dr. Myrtis Ser on how many times she  has had to hold her Altace.  It may be that she has to remain off of  Altace at this point in time due to hypotension.   DISPOSITION:  As above.      Tereso Newcomer, PA-C  Electronically Signed      Duke Salvia, MD, [facility]  Electronically Signed   SW/MedQ  DD: 06/24/2006  DT:  06/25/2006  Job #: [id number]

## 2010-06-19 NOTE — Discharge Summary (Signed)
NAME:  [name], [name]              ACCOUNT NO.:  [id number]   MEDICAL RECORD NO.:  [id number]          PATIENT TYPE:  INP   LOCATION:  2924                         FACILITY:  [facility]   PHYSICIAN:  Pramod P. Pearlean Brownie, MD    DATE OF BIRTH:  [birth year]   DATE OF ADMISSION:  06/18/2007  DATE OF DISCHARGE:  06/22/2007                               DISCHARGE SUMMARY   DIAGNOSES AT TIME OF DISCHARGE:  1. Right parietal middle cerebral artery infarct, likely cardioembolic      secondary to Takotsubo's cardiomyopathy with low ejection fraction,      no clot seen on 2-D echocardiogram.  2. Urinary tract infection.  3. Hypertension.  4. Dyslipidemia.  5. B12 deficiency anemia.  6. Seizure disorder.  7. Osteoarthritis.  8. Thyroid nodule.  9. Mitral valve prolapse.  10.Meningioma diagnosed in December 2007.  11.Subarachnoid hemorrhage secondary to fall in December 2007.  12.Takotsubo's cardiomyopathy with history of cath in Jun 19, 2006,      with ejection fraction of 45%, ST-segment elevation myocardial      infarction secondary to Takotsubo again.   STUDIES PERFORMED:  1. CT of the brain on admission showed no acute infarct.  No mass      effect.  Right posterior parietal postoperative encephalomalacia.  2. MRI of the brain shows a 1.0-1.5-cm area of acute infarct in the      right frontal parietal cortical and subcortical area.  Chronic      small vessel changes elsewhere throughout the brain.  Atrophy and      gliosis of the right occipital lobe adjacent to the region of      resection of a meningioma.  No residual or recurrent tumor.  3. MRA of the brain negative for angio of the large or medium-sized      vessels.  4. Followup CT of the head shows no change since prior CT.  Chest x-      ray shows no acute cardiopulmonary abnormality.  5. EKG shows normal sinus rhythm with occasional premature ectopic      complexes and anterior infarct. age undetermined, new since      previous  tracing, cannot rule out inferior infarct, ST and T wave      abnormality, consider inferolateral ischemia.  6. A 2-D echocardiogram shows EF of 35%-40% with akinesis of the mid      distal inferior septal and inferior wall hypokinesis of the apex.      This is new from echo in July 2008, some of the changes were      present in May 2008.  7. EEG shows abnormal EEG with focal swelling and consecutive      increased amplitude of the right temporal region.  This can      correlate with location of stroke.  8. Transcranial Doppler completed, results pending.  9. Carotid Doppler shows no ICA stenosis.   LABORATORY STUDIES:  CBC with hemoglobin 9.0 and hematocrit 27.5,  otherwise normal.  Chemistry with glucose 102, otherwise normal.  Liver  function tests normal.  Cardiac enzymes with troponins 0.7  on admission  with CK 107, CK-MB 5.5, troponin stand at 0.11, CK 49, and CK-MB 1.7.  Cholesterol 159, LDL 107, HDL 38, and triglycerides 70.  Urinalysis  shows large leukocyte esterase, many bacteria,white blood cells too  numerous to count, and 7-10 red blood cells.  BNP 812.  Homocystine 9.4,  hemoglobin A1c 5.8, and coagulation studies on admission were normal.   HISTORY OF PRESENT ILLNESS:  [name] is an 77 year old  Caucasian female with a remote history of right posterior meningioma,  which was resected at [facility] by Dr. Tawanna Cooler in August 2008.  Symptoms, she was having at that time included contusion on the left  visual field cut, though she seems to have been improving.  She has  never had seizures associated with that.  She has never had any weakness  or numbness of the arms or legs associated with the meningioma.  She was  recovering well.  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  The morning  of admission, she got up as usual and was feeling fine.  She sat down  near the napkin and was watching a  show on television when she had a  sudden onset of left arm numbness and weakness.  She was unable to move  her arm at all.  There was no seizure activity.  She had no loss of  consciousness.  She had no language deficit.  She had no new visual  problems or face or leg involvement.  She called 911 and was transported  to the emergency room and a Code Stroke was called.  CT of the brain  showed no acute abnormalities.  She was given full-dose intravenous IV  tPA and admitted to the neuro ICU.   HOSPITAL COURSE:  Post tPA, the patient had significant right  retroorbital headache, 7/9.  She did a stat CT, which showed no acute  abnormality.  An MRI done later did confirm a right parietal infarct.  The patient had slightly elevated troponins and CK-MBs on admission and  felt to have MI.  Dr. Corinda Gubler cardiology was consulted.  They felt she  had no significant ST changes with her minimal cardiac enzymes.  The  patient had a history of Takotsubo's cardiomyopathy.  Cardiologist  reviewed her EF from July 2008 and it looks more like May 2009 when she  actually had an MI.  Enzyme but is trivial and is not clear to  cardiology that the enzyme changes did not have as an outpatient prior  to admission.  Recommendations are to continue aspirin and Plavix and  had Imdur for spasms.  From the cardiology standpoint, she was stable  for discharge and they will follow up as an outpatient.  In the  meanwhile, from the stroke standpoint, the patient has made great  recovery.  She was evaluated by PT and OT and felt to be benefitted from  home health therapy, which was arranged.  We will also get her a nurse  for completeness.  We found evidence of urinary tract infection during  hospitalization and Cipro was started to treat that.  She is safe for  discharge home and will go home with her family.   CONDITION ON DISCHARGE:  The patient is alert and oriented x3.  No  aphasia.  No dysarthria.  Eye movements  are full.  Face is symmetric.  She has no drift.  She has a decreased fine motor movement on the left  and her left grip is weak.  She has mild decreased left finger-nose-  finger.  Her gait is slightly ataxic.   DISCHARGE PLAN:  1. Discharge home with family.  2. Home health, PT, OT, and nurse.  3. Continue aspirin and Plavix for stroke prevention.  4. Follow up with Dr. Pearlean Brownie in 2-3 months.  5. Follow up with Dr. Myrtis Ser in 1 month.  6. Outpatient TCD, emboli monitoring, and bubble study.      Annie Main, N.P.    ______________________________  Sunny Schlein. Pearlean Brownie, MD    SB/MEDQ  D:  06/22/2007  T:  06/23/2007  Job:  [id number]   cc:   Pramod P. Pearlean Brownie, MD  Luis Abed, MD, [facility]

## 2010-06-19 NOTE — Procedures (Signed)
REFERRING PHYSICIAN/MD:  On call, Santina Evans A. Orlin Hilding, MD   TYPE OF EEG RECORDING:  A portable study performed in the Neurologic  Intensive Care Unit.  An awake and drowsy female right-handed.  Activation procedures do not include hyperventilation or photic  stimulation.   MEDICATIONS:  Zofran, Xanax, Coreg, aspirin, Normodyne, Cardene,  nitroglycerin and Tylenol.   LAST MEAL:  Dinner.   HISTORY:  An 77 year old female admitted with acute stroke and treated  with TPA.   DESCRIPTION:  A posterior dominant background rhythm showed asymmetry  with slowing and consecutive amplitude increased over the right temporal  and occipital region.  The posterior dominant frequency here is 9 Hz.  The much lower amplitude in the left occipital hemisphere generates a  frequency of 11 Hz.  Throughout the EEG recording, there is slowing over  the T6 electrode noticed.  This is most likely due to an underlying  anatomical defect and could be provoked by a stroke.  I was unable to  change montages during the reading, but could document the above named  changes best on a double banana.  The EKG shows an irregular heart beat  with variable R to R interval and variable QRS complex shapes.   CONCLUSION:  This is an abnormal EEG with focal slowing and consecutive  increased amplitude over the right temporal region.  Given the clinical  history, this would correlate with the location of a stroke.      Melvyn Novas, M.D.  Electronically Signed     ZO:XWRU  D:  06/20/2007 11:48:06  T:  06/21/2007 01:53:06  Job #:  [id number]

## 2010-06-19 NOTE — Assessment & Plan Note (Signed)
[facility]                            CARDIOLOGY OFFICE NOTE   [name], [name]                       MRN:          [id number]  DATE:07/24/2007                            DOB:          1922/10/13    See my office note dictation of July 13, 2007.  Today, I have completely  reviewed the [facility] chart.   PROBLEMS:  1. History of occipital meningioma that was treated with surgery at      [facility] in 2008.  2. Cardiac events.  The patient had an acute MI in May 2008 with no      culprit.  There was apical ballooning, and it was most likely a      takotsubo event.  Her ejection fraction had improved.  However,      during her recent hospitalization in May 2009, her ejection      fraction had decreased again to 35-40%.  The exact reason is not      clear.  There was question that she may have had a second takotsubo      event at some point.  No further cardiac evaluation was done.  She      is to be treated over time for the possibilities of recurrent      takotsubo events.  3. History of enlarged thyroid, followed by Dr. Amador Cunas.  4. History of scattered tiny bilateral pulmonary nodules in the past.  5. History of atrophic gastritis.  6. History of B12 deficiency that is treated.  7. History of arthritis.  8. History of an event in the past with liver test malfunction.  There      was question that statins could be implicated.  Therefore, statins      are not used, although we are not sure that this was the problem.  9. Right parietal middle cerebral artery infarct with a stroke during      the admission of Jun 18, 2007.  In the chart, it is said to have      likely been a cardioembolic event.  However, we do not have      absolute proof to this.  There was no definite clot seen in the      left ventricle.  A decision was made by Neurology to use aspirin      and Plavix.  Coumadin was not mentioned for use in the chart.      Therefore, aspirin  and Plavix are being used for all of these      situations mentioned above.  10.History of a subarachnoid hemorrhage secondary to fall in December      2007.  11.History of difficulties with her left leg.  She did have a venous      Doppler, and there was no DVT.   This is the complete assessment of the patient's cardiac status as it  stands today.     Luis Abed, MD, [facility]  Electronically Signed    JDK/MedQ  DD: 08/05/2007  DT: 08/06/2007  Job #: [id number]

## 2010-06-19 NOTE — H&P (Signed)
[name], [name]              ACCOUNT NO.:  [id number]   MEDICAL RECORD NO.:  [id number]          PATIENT TYPE:  INP   LOCATION:  3732                         FACILITY:  [facility]   PHYSICIAN:  Lonia Blood, M.D.       DATE OF BIRTH:  [birth year]   DATE OF ADMISSION:  07/08/2008  DATE OF DISCHARGE:                              HISTORY & PHYSICAL   PRIMARY CARE PHYSICIAN:  Gordy Savers, MD   CHIEF COMPLAINT:  Left hand numbness and weakness.   HISTORY OF PRESENT ILLNESS:  [name] is an 77 year old woman with  history of meningioma and MCA stroke, who presents to the emergency room  with complaints of waking up this morning with left hand numbness and  weakness.  She had a similar episode in 2009 at the time of which she  had an acute MCA distribution stroke.  Felt to be takotsubo  cardiomyopathy.  Currently, [name] report that she is feeling  better, although she still has some persistent numbness in her left  upper extremity.  Of note, the patient had some gamma knife surgery at  [facility] for residual meningioma last month.  Unfortunately, I  do not have the information on the exact location of that tumor.   PAST MEDICAL HISTORY:  1. Coronary artery disease status post myocardial infarction.  2. Osteoarthritis.  3. Meningioma.  4. Subdural hematoma in 2007 after a fall.  5. Vitamin B12 deficiency.  6. Hypertension.  7. Right total hip replacement.  8. Right shoulder surgery.   NEW MEDICATIONS:  Aspirin, Coreg, Plavix, and vitamin B12.   ALLERGIES:  DILANTIN, LIPITOR, PHENERGAN, and SULFA.   SOCIAL HISTORY:  The patient lives with her husband.  She is retired.  She does smoke cigarettes, does not drink alcohol.   FAMILY HISTORY:  Noncontributory secondary to her age.   REVIEW OF SYSTEMS:  Negative for chest pain, shortness of breath,  nausea, vomiting, abdominal pain, diarrhea, constipation, dysuria,  urinary frequency, fevers, or chills.  All other  systems per HPI are  reviewed and negative.   PHYSICAL EXAMINATION:  VITAL SIGNS:  Upon admission, temperature is  97.6, heart rate 56, respiratory rate 16, blood pressure 149/71,  saturation 98% on room air.  GENERAL:  The patient is alert, oriented, in no acute distress.  HEENT:  Head normocephalic, atraumatic.  Eyes, pupil equal, round, and  reactive to light and accommodation.  Extraocular movement intact.  Throat clear.  NECK:  Supple.  No JVD.  CHEST:  Clear to auscultation without wheezes, rhonchi, or crackles.  HEART:  Regular rate and rhythm without murmurs, rubs, or gallops.  ABDOMEN:  Soft, nontender, nondistended.  Bowel sounds are present.  EXTREMITIES:  Lower extremities without edema.  NEUROLOGIC:  Cranial nerves II through XII intact.  Strength 5/5 in the  right upper extremity and left lower extremity and it is 4/5 in the left  hand.  The patient reports decreased sensation in all her fingers of the  left hand on the palmar surface.  Deep tendon reflex is symmetric.  Babinski sign negative.  LABORATORY DATA:  On admission, white blood cell count 6.1, hemoglobin  10.8, platelet count 233.  Sodium 140, potassium 4.6, chloride 108,  bicarbonate 27, BUN 19, creatinine 0.8.  Urinalysis within normal  limits.  Head CT, no acute abnormalities.   ASSESSMENT/PLAN:  This is an 77 year old woman presenting with signs and  symptoms concerning for a small stroke versus cerebral edema due to the  recent treatment of a meningioma.   PLAN:  1. Admit [name], place her on telemetry, check frequent      neurological exams, and obtain an MRI of the brain with and without      IV contrast.  Further plan of care will be dictated based on these      findings.  For now, I will continue the patient on aspirin and      Plavix.  The patient has a history of recurrent embolic stroke and      when asked if she will like to be started on Coumadin, she promptly      refused and told me  she will stay on aspirin and Plavix.  2. Anemia.  The patient will be continued on vitamin B12 monthly      through the outpatient physician's office.  3. DVT prophylaxis will be done using Lovenox.      Lonia Blood, M.D.  Electronically Signed     SL/MEDQ  D:  07/08/2008  T:  07/09/2008  Job:  [id number]   cc:   Gordy Savers, MD

## 2010-06-19 NOTE — Cardiovascular Report (Signed)
NAMEAZZURE, GARABEDIAN NO.:  [id number]   MEDICAL RECORD NO.:  [id number]          PATIENT TYPE:  INP   LOCATION:  3705                         FACILITY:  [facility]   PHYSICIAN:  Verne Carrow, MDDATE OF BIRTH:  Dec 17, 1922   DATE OF PROCEDURE:  12/10/2007  DATE OF DISCHARGE:                            CARDIAC CATHETERIZATION   PRIMARY CARDIOLOGIST:  Luis Abed, MD, [facility]   PROCEDURE PERFORMED:  1. Left heart catheterization.  2. Selective coronary angiography.  3. Left ventricular angiogram.  4. Placement of an Angio-Seal femoral artery closure device.   OPERATOR:  Verne Carrow, MD   INDICATIONS:  This is an 77 year old [place] female with a history of  nonobstructive coronary artery disease, as well as hypertension and  prior brain tumor removal, as well as intracranial hemorrhage who  presented to the hospital with complaints of chest pain over the last 4  days.  The patient ruled out for a myocardial infarction, however, was  felt to be high risk for obstructive coronary artery disease.   DETAILS OF PROCEDURE:  The patient was brought to the Catheterization  Laboratory after signing informed consent for the procedure.  The right  groin was prepped and draped in a sterile fashion.  Lidocaine 1% was  used for local anesthesia in the right groin.  A 6-French sheath was  inserted into the right femoral artery.  Standard 6-French diagnostic  catheters were used to selectively engage the left coronary system and  the right coronary artery.  Following selective coronary angiography, a  6-French pigtail catheter was used to cross the aortic valve into the  left ventricle.  Following the performance of the left ventricular  angiogram, the pigtail catheter was pulled back across the aortic valve  with no significant pressure gradient measured.  Following the case, an  Angio-Seal femoral artery closure device was placed in the right femoral  artery.   The patient tolerated the procedure well and was taken to the  recovery area in stable condition.   FINDINGS:  1. The left main coronary artery is a large vessel that bifurcates      into the circumflex, the LAD and a moderate size ramus branch.      There was no evidence of disease in the left main coronary artery.  2. The left anterior descending coronary artery is a large vessel that      courses to the apex.  It gives off a large diagonal branch that is      bifurcating and has a 30% ostial lesion.  There is 20-30% plaque      noted in the ostium on the LAD as well as a 30% plaque noted in the      mid LAD.  There is no obstructive disease noted in the LAD.  3. There is a moderate size short ramus intermedius branch that has an      ostial 40-50% stenosis.  4. Circumflex is a moderate-to-large size vessel that gives off three      large obtuse marginal branches.  There is a 30% stenosis noted in  the proximal portion of the circumflex.  The obtuse marginal      branches are free of disease.  5. The right coronary artery is a dominant vessel that has plaque      disease noted in the proximal portion and a 30% stenosis noted in      the midportion.  There are luminal irregularities noted in the      distal vessel.  This vessel does bifurcate into the PDA and a      posterolateral branch.  6. Left ventricular angiogram demonstrated low normal systolic      function with an ejection fraction estimated at 50%.  There were no      focal wall motion abnormalities noted.   HEMODYNAMIC FINDINGS:  Left ventricular pressure 158/1, end-diastolic  pressure 9, central aortic pressure 163/73.   IMPRESSION:  1. Nonobstructive coronary artery disease.  2. Low normal left ventricular systolic dysfunction.  3. Noncardiac etiology of chest pain.   RECOMMENDATIONS:  I did not recommend any further cardiac workup due to  this patient's chest pain at the current time.  Further workup will be   performed by her primary cardiologist.      Verne Carrow, MD  Electronically Signed     CM/MEDQ  D:  12/10/2007  T:  12/11/2007  Job:  [id number]   cc:   Luis Abed, MD, [facility]

## 2010-06-19 NOTE — Consult Note (Signed)
NAMEMADISIN, HASAN NO.:  [id number]   MEDICAL RECORD NO.:  [id number]          PATIENT TYPE:  INP   LOCATION:  2023                         FACILITY:  [facility]   PHYSICIAN:  Iva Boop, MD,FACGDATE OF BIRTH:  [birth year]   DATE OF CONSULTATION:  06/21/2006  DATE OF DISCHARGE:                                 CONSULTATION   REASON FOR CONSULTATION:  Abdominal pain, chronic and weight loss.   ASSESSMENT:  An 77 year old white woman admitted with chest discomfort,  workup has not revealed a cardiac cause of that.  In the background  there is chronic recurrent right upper quadrant pain, usually  postprandial but not always.  She has had about a 30 pounds weight loss  from 157-119 pounds over the last 2 years.  She had a cholecystectomy,  perhaps for this pain and has had the pain afterwards, she says just  like my gallbladder pain.  There is some early satiety.  There is  anorexia.  Her GI review of systems otherwise seems unremarkable.   Etiology of this is not entirely clear.  She had an EGD in 2002, with  some atrophic gastritis.  And, she had a colonoscopy in 2003, that  showed diverticulosis (both by Dr. Sheryn Bison).  Un-enhanced CT  scanning of the abdomen here to rule out retroperitoneal bleed has not  shed any light on this.  There is a 1.4-cm left thyroid nodule on the  thyroid gland which is enlarged and heterogeneous on her CT of the  chest.  She does have extensive sigmoid diverticulosis.  She has an  meningioma known in the right occipital lobe that has grown since last  study.  This is [facility] at [facility].  I doubt any of these things are  related to her problems.   Without the location of the pain, mesenteric ischemia seems possible,  but she has right upper quadrant pain and that is usually periumbilical  pain.  She could have a GI malignancy of the upper GI tract.  She says  the right upper quadrant bulges at time, but I  cannot find any  hernia  there.  This could be a functional pain associated with depressive  symptomatology and weight loss.  Note, there are biapical pleural  parenchymal densities thought to be scarring and scattered tiny  bilateral pulmonary nodules on the chest CT.  Some malignancy could be  associated with these, though again that seems unlikely.  Her LFTs are  normal and as best I can tell, she does not have any biliary dilation,  so I do not think choledocholithiasis is possible.   E-chart is not up, so I cannot review all of her old records at this  time.   PLAN AND RECOMMENDATIONS:  Start the workup with an upper GI endoscopy.  Further plans pending that.  Risks, benefits, and indications were  explained to the patient as well as her daughter.   HISTORY:  This is an 33 year old white woman with problems as above.  I  do not get any other problems on GI review of systems.  She  does  apparently have irritable bowel issues but in general moves her bowels  well without any particular problems.  The chronic recurrent abdominal  pain is there.  She forces herself to eat.  She does not vomit.  There  does not seem to be any nausea.  There is no bleeding reported.  She did  have nausea, vomiting, and shortness of breath when she had anterior  chest discomfort described as a tightness that prompted admission on Jun 19, 2006.  She had a slightly elevated troponin, but otherwise her EKG  was unrevealing.  It is thought that perhaps she had possible Tako-Tsubo  cardiomyopathy on cardiac catheterization.  She had nonobstructive  coronary disease.  No critical stenoses or apical lesion.  She has 1+  mitral regurgitation.   PAST MEDICAL HISTORY:  1. Meningioma in the right occipital area.  This has apparently      enlarged in size somewhat thought it is still small.  2. Hypertension.  3. B12 deficiency.  4. Osteoarthritis.  5. Remote history of seizures.  6. Traumatic subarachnoid hemorrhage secondary  to fall December 2007,      at which time the meningioma was diagnosed.  7. Multiple chest pain admissions with unrevealing findings on      catheterization and functional studies of the heart.  8. Right rotator cuff repair, 1993.  9. Hip surgery, 2002.  10.Cholecystectomy, 2002.  11.Hysterectomy, 1961.  12.Bladder suspension procedure, 1984.  13.Appendectomy.  14.Diverticulosis on colonoscopy, 2003.  15.Atrophic gastritis, 2002.  16.EGD.   SOCIAL HISTORY:  She lives in [place] with her husband.  She is the  primary care giver.  I am actually currently caring for her son-in-law  and her daughter has spoken to me.  She takes care of her husband, the  household, and five dogs.  She is retired from [employer].  She is  originally from [place].  She is a concentration camp survivor. No  smoking, alcohol, or herbs.  She is otherwise very active.   FAMILY HISTORY:  Parents died of unknown etiology.  One sister had a  stroke.  Two brothers and one sister are otherwise alive.   REVIEW OF SYSTEMS:  Positive for headache, glasses, dentures, nocturia,  right shoulder pain, increased stress level, declining health of her  husband (I do not know exactly what his health problems are).  All other  systems appear negative, other than those things mentioned above in the  HPI and the assessment.   PHYSICAL EXAMINATION:  GENERAL:  Reveals an elderly, alert, and bright-  appearing, white woman in no acute distress.  VITAL SIGNS:  Blood pressure 95/60, heart rate 80, respirations 18,  temperature 97.4.  EYES:  Anicteric.  MOUTH:  Shows dentures, otherwise free of lesions.  NECK:  Supple without thyromegaly or mass, to my exam.  There is no  supraclavicular cervical adenopathy palpated.  LUNGS:  Clear, though decreased breath sounds diffusely.  She is thin  and somewhat asthenic appearing. HEART:  S1-S2.  I hear no rubs,  murmurs, or gallops. ABDOMEN:  Soft.  There are some well-healed surgical  scars.  There are  no hernia.  She is tender to deep palpation in the right upper quadrant.  The ribs are nontender.  Bowel sounds are present.  There are no bruits.  LOWER EXTREMITIES:  Free of edema.  SKIN:  Warm and dry without acute rash.   LABORATORY DATA:  Her LFTs are normal.  Her albumin is normal on  admission, though it fell to 3.3 afterwards.  Amylase 33, lipase 27.  Glucose 147 on admission.  Coag's normal.  D-dimer was normal.  Troponin  peaked at 0.55.  She did have a CK-MB high but a total CK normal with  levels of 14.7 and 153.  A troponin peaked at 3.91.  Hemoglobin 11.1  after admission, 13.9 on admission.  Sed rate 10.  MCV normal.   CURRENT MEDICATIONS:  1. Lovenox 40 mg daily.  2. Aspirin 325 mg daily.  3. Protonix 40 mg daily.  4. Lipitor 80 mg at bedtime.  5. Coreg 3.125 mg twice daily.  6. Ambien 5 mg at bedtime as needed.   DRUG ALLERGIES:  1. PHENYTOIN.  2. SULFA.  3. PROMETHAZINE.   I appreciate the opportunity to care for this patient.      Iva Boop, MD,FACG  Electronically Signed     CEG/MEDQ  D:  06/21/2006  T:  06/21/2006  Job:  [id number]   cc:   Luis Abed, MD, [facility]  Gordy Savers, MD

## 2010-06-19 NOTE — H&P (Signed)
NAME:  [name], [name]              ACCOUNT NO.:  [id number]   MEDICAL RECORD NO.:  [id number]          PATIENT TYPE:  INP   LOCATION:  3035                         FACILITY:  [facility]   PHYSICIAN:  Sean A. Everardo All, MD    DATE OF BIRTH:  03-Jun-1922   DATE OF ADMISSION:  09/10/2007  DATE OF DISCHARGE:                              HISTORY & PHYSICAL   REASON FOR ADMISSION:  Probable TIA.   HISTORY OF PRESENT ILLNESS:  An 77 year old woman with a significant  history of cerebrovascular disease.  She has about 8 hours' onset  exacerbation of chronic weakness in her left upper extremity.  She has  slight associated numbness and pain there.  These symptoms improved  significantly between then and now, about 8 hours later, but have not  completely resolved.  These have improved without any therapy.  She also  has some associated chest heaviness.  This was also resolved.   PAST MEDICAL HISTORY:  1. Cerebrovascular disease.  2. CVA in June 2009.  3. Thyroid nodule.  4. CAD, status post MI 2 months ago.  5. IBS.  6. Osteoarthritis.  7. Meningioma  8. Intracerebral hemorrhage in 2007 due a fall.  9. Vitamin B12 deficiency.  10.Hypertension.   FAMILY HISTORY:  Two brothers have diabetes.   SOCIAL HISTORY:  She is retired.  She is here today with her daughter,  with whom she lives.   REVIEW OF SYSTEMS:  Question of shortness of breath, but she says that  if she has any, it is not much.  She denies the following nausea,  vomiting, fever, loss of consciousness, any change in her chronic visual  loss, abdominal pain, rectal bleeding, hematuria, dysuria, excessive  diaphoresis, skin rash, and headache.   PHYSICAL EXAMINATION:  VITAL SIGNS:  Blood pressure is 150/87, heart  rate is 87, respiratory rate 20, and the patient is afebrile.  GENERAL:  No distress.  SKIN:  No rash, not diaphoretic.  HEENT:  No evidence of head trauma on external examination.  No  periorbital swelling.  NECK:   Supple.  CHEST:  Clear to auscultation.  No respiratory distress.  CARDIOVASCULAR:  No edema.  Regular rate and rhythm.  No murmur.  Pedal  pulses are intact.  ABDOMEN:  Soft and nontender.  No hepatosplenomegaly.  No mass.  BREASTS/GYNECOLOGIC/RECTAL:  Not done at this time due to the patient's  condition.  EXTREMITIES:  Normal except for osteoarthritic changes.  NEUROLOGIC:  Alert and well oriented.  Cranial nerves appear to be  intact bilaterally.  Muscle strength is diffusely normal except it is  minimally subjectively decreased throughout the left upper extremity.  Sensation is diffusely intact to touch.   Electrocardiogram is normal.   IMPRESSION:  1. Apparent recurrent transient ischemic attack.  It appears to be in      the same distribution as her recent cerebrovascular accident.  It      is clinically much better.  2. Chest pain, uncertain etiology.  3. History of intracerebral hemorrhage complicates the therapy of      apparent recurrent transient ischemic attack.  4. Other chronic medical problems as noted above.   PLAN:  1. Admit to telemetry.  2. Symptomatic therapy.  3. Cardiac enzymes.  4. Continue aspirin and Plavix, which she is taking at home.  5. P.r.n. sublingual nitroglycerin.  6. I discussed code status with the patient and her daughter.  The      patient states she wants to defer this to her daughter.  The      patient's daughter says she does not wish to make any decision      right now.      Sean A. Everardo All, MD  Electronically Signed     SAE/MEDQ  D:  09/10/2007  T:  09/11/2007  Job:  [id number]

## 2010-06-19 NOTE — Discharge Summary (Signed)
[name], [name]              ACCOUNT NO.:  [id number]   MEDICAL RECORD NO.:  [id number]          PATIENT TYPE:  INP   LOCATION:  2023                         FACILITY:  [facility]   PHYSICIAN:  Madolyn Frieze. Jens Som, MD, FACCDATE OF BIRTH:  28-Aug-1922   DATE OF ADMISSION:  06/19/2006  DATE OF DISCHARGE:  06/23/2006                               DISCHARGE SUMMARY   PRIMARY CARDIOLOGIST:  Dr. Willa Rough.   PRIMARY CARE PHYSICIAN:  Dr. Eleonore Chiquito.   CONSULTING PHYSICIAN:  Dr. Leone Payor, gastroenterology.   PROCEDURES PERFORMED DURING HOSPITALIZATION:  1. Cardiac catheterization completed on Jun 19, 2006.      a.     Left main coronary artery gives rise to the left anterior       descending at the circumflex and ramus intermedius branch.  There       is mild luminal irregularity noted, as well as a small area of       calcification, but no obstructive stenosis.  The left anterior       descending is a medium caliber vessel with 4 diagonal branches,       the first of which is the largest and bifurcates proximally.  In       the osteum of the left anterior descending is approximately a 30%       stenosis followed by an area of 40% stenosis within the proximal       vessel.  Otherwise, there are major luminal irregularities.  The       proximal bifurcating diagonal branch has a 30% ostial stenosis.       There is a relatively large caliber, but short, ramus intermedius       branch noted.  There is a 50% ostial stenosis within this branch.       The circumflex coronary artery is a medium caliber vessel with 3       obtuse marginal branches.  There is a 30% proximal stenosis noted       within the circumflex, but otherwise minor luminal irregularities.      b.     The right coronary artery is a medium to large caliber       vessel and dominant vessel with posterior descending branch and       posterior lateral system.  There is a 30% stenosis in the proximal       vessel followed by  a 40 to 50% profuse stenosis in the mid vessel,       and a 40% stenosis in the distal vessel.      c.     A left ventriculography was performed to the REO projection       and reveals an ejection fraction of approximately 45% with a large       area of apical ballooning, 1+ mitral regurg is noted in the       setting of ventricular ectopy.  At this point, would anticipate       aggressive medical therapy and stabilization per Dr. Diona Browner.  2. EGD completed by Dr. Stan Head on  Jun 22, 2006.      a.     Atrophic gastritis, small hiatal hernia.  Otherwise okay.      b.     No cause of problems obvious here.  I suspect functional       disturbances associated with significant situational stressors       (she cares for 75 year old husband and has a meningioma and new       thyroid lesion).   DISCHARGE DIAGNOSES:  1. ST elevated myocardial infarction.  2. Probable Takotsubo's.  3. Abnormal chest CT.      a.     Enlarged heterogeneous thyroid gland with a 1.4 cm lesion in       the left thyroid lobe.  Bi-apical pleural-parenchymal densities       probably related to scarring.  Scattered tiny bilateral pulmonary       nodules are noted.  If the patient has no history of smoking or       malignancy, a follow up CT chest without contrast in 6 to 12       months could be used to ensure stability.  In the setting of       cancer or smoking history, a 3 month CT follow up is recommended.  4. Thyroid nodule.  5. Abdominal pain.  6. History of ICH and mass.  7. B12 deficiency.  8. Arthritis.   HISTORY OF PRESENT ILLNESS:  This is an 77 year old Caucasian female who  was transported via EMS to [facility] emergency room secondary to chest  discomfort.  The patient began to have chest discomfort at approximately  8 a.m. in the morning while making tea, which she described as tightness  anteriorly.  It was associated with radiation to her right neck and to  her abdominal area with associated  nausea, vomiting, and shortness of  breath.  The patient called EMS secondary to ongoing chest discomfort  after taking 2 aspirin.  The patient was brought to [facility] emergency  room and an EKG performed showing minimal ST elevation in lead 1, AVL,  and V6.  Secondary to continued chest discomfort, the patient was  transported to cardiac catheterization lab for immediate cardiac  catheterization.  The patient's troponin was slightly elevated at 0.25.   The patient did undergo cardiac catheterization with results described  above.  Medical management was recommended by Dr. Diona Browner.   The patient was seen and examined by Dr. Olga Millers on Jun 20, 2006  with complaints of lower back pain.  The patient also had some abdominal  pain as well.  Dr. Stan Head was consulted secondary to these  symptoms, and EGD was planned.  In the interim, the patient was given  Mylanta and Maalox and pain was relieved.  CT of the abdomen and chest  revealed no PE.  KUB was also completed.  Dr. Leone Payor saw the patient  and did schedule an EGD on Jun 22, 2006, with revealed atrophic  gastritis, small hiatal hernia, and otherwise okay.  He believed the  problems were related to functional disturbances associated with  significant situational stressors.  Protonix was discontinued as this  contributed to diarrhea.   The patient also had a CT scan of the chest that revealed a thyroid  abnormality with thyroid nodule noted.  The patient was to follow up  with her primary care physician for further evaluation and need for scan  or follow up CTs at their discretion.  On Jun 23, 2006, the patient was seen and examined by Dr. Olga Millers.  The patient was found to be stable for discharge.  Dr.  Jens Som had planned follow up lipids and LFTs in 6 weeks, an  echocardiogram in 12 weeks after discharge.  She is to follow up with  Dr. Amador Cunas, her primary care physician, for re-evaluation and continued  treatment of thyroid nodule at his discretion.  I have spoken  with Dr. Leone Payor who did not recommend follow up with his office unless  necessary at the discretion of Dr. Amador Cunas.  The patient will be  discharged on current medication regimen with the exception of Protonix.  The patient will be see by Dr. Myrtis Ser in a couple of weeks with follow up  labs prior to his appointment.   DISCHARGE LABS:  Hemoglobin 11.4, hematocrit 34.3, white blood cell 7.8,  platelets 202,000.  Sodium 141, potassium 3.9, chloride 105, CO2 29,  glucose 95, BUN 13, calcium 8.9.  Amylase 33, lipase 27, bilirubin 0.6,  direct bilirubin less than 0.1.  Alkaline phosphatase 82.  SGOT 35, SGPT  14.  Total protein 5.8, albumin 3.3.  Cardiac biomarkers most recent  troponin 3.12, CK 137, CK MB 11.2.  EKG revealing normal sinus rhythm  with T wave inversion indicative of lateral ischemia.  She did have a  prolonged QT interval of 490 milliseconds.   DISCHARGE MEDICATIONS:  1. Coreg 6.25 mg b.i.d.  2. Altace 1.25 mg twice a day.  3. Lipitor 80 mg daily.  4. Aspirin 325 mg daily.  5. Librax 1 cap three times a day.   ALLERGIES:  SULFA, DILANTIN, PHENERGAN.   FOLLOW UP PLANS AND APPOINTMENTS:  1. The patient will be placed in cardiac rehab phase 2 as an      outpatient at the discretion of Dr. Myrtis Ser.  2. The patient is scheduled to see Dr. Myrtis Ser on July 09, 2006 at 10:15      a.m. for follow up appointment.  3. The patient is scheduled to have fasting lipids and LFTs done August 04, 2006 at 10:15.  4. The patient is scheduled for a BMET on Tuesday, Jul 01, 2006 at      3:30.  5. The patient is scheduled for an echocardiogram on September 15, 2006      at 10:30.  6. The patient is to follow up with Dr. Amador Cunas in 2 weeks for      continued medical management and need for follow up concerning      thyroid nodule and repeat of CT scan at his discretion.  7. The patient has been given post cardiac  catheterization      instructions with particular references on the right groin site for      bleeding, hematoma, pain, or infection.   Time spent with the patient to include physician time, 50 minutes.      Bettey Mare. Lyman Bishop, NP      Madolyn Frieze. Jens Som, MD, [facility]  Electronically Signed    KML/MEDQ  D:  06/23/2006  T:  06/23/2006  Job:  [id number]   cc:   Gordy Savers, MD

## 2010-06-19 NOTE — Discharge Summary (Signed)
NAMEPATTON, SWISHER NO.:  [id number]   MEDICAL RECORD NO.:  [id number]          PATIENT TYPE:  INP   LOCATION:  3705                         FACILITY:  [facility]   PHYSICIAN:  Luis Abed, MD, FACCDATE OF BIRTH:  October 20, 1922   DATE OF ADMISSION:  12/09/2007  DATE OF DISCHARGE:  12/11/2007                               DISCHARGE SUMMARY   PRIMARY CARDIOLOGIST:  Luis Abed, MD, [facility]   PRIMARY CARE PHYSICIAN:  Gordy Savers, MD with [facility].   PROCEDURES PERFORMED DURING HOSPITALIZATION:  1. Cardiac catheterization completed by Dr. Verne Carrow on      December 08, 2007, with no change in prior cardiac catheterization      in May 2008.      a.     Nonobstructive coronary artery disease, normal LV function       with systolic dysfunction, noncardiac etiology of chest pain.      b.     No evidence of disease in the left anterior LAD showing a       30% ostial lesion of the large first diagonal by a 20-30% plaque       noted in the ostium of the LAD as well 30% plaque noted in the mid       LAD with no obstructive disease elsewhere.  There was moderate-to-       short ramus intermedius branch and had an osteal 40-50% stenosis.       Circumflex had a 30% stenosis noted in the proximal portion.       Right coronary artery had proximal 30% stenosis noted in the       midportion.  LVEF was 50%.   FINAL DISCHARGE DIAGNOSES:  1. Chest pain, noncardiac.  2. Coronary artery disease.      a.     Three-vessel coronary artery disease, nonobstructive per       cardiac catheterization on December 10, 2007.  3. History of cerebrovascular accident with left hemiparesis.  4. History of Takotsubo.  5. History of thyroid nodule.  6. History of meningioma, status post resection.  7. Status post intracranial hemorrhage secondary to fall.  8. History of B12 deficiency.  9. Hypertension.   HOSPITAL COURSE:  This is an 77 year old female patient who  was very  well known to Dr. Willa Rough with a history as stated above who had  chest pain on the left side 4 days in duration at rest.  It was 9/10  sharp central substernal chest pain radiating to the left arm.  The  patient states that it lasted approximately 4 hours with sublingual  nitroglycerin helping a little.  The pain was associated with shortness  of breath and headache.  As a result of this, the patient was admitted  to rule out myocardial infarction.  The patient also had a history of  DVT in the remote past, but there was no leg swelling, or inability of  travel, or hemoptysis noted.   The patient was seen and examined by Dr. Willa Rough in the emergency  room as  a result of the patient's recurrence of chest pain.  She was  admitted to rule out myocardial infarction with cardiac enzymes cycled.  The patient did not have any EKG changes indicative of acute ischemia.  The patient did have a history of Takotsubo in the past and was also  evaluated for this.   The patient did undergo cardiac catheterization on December 10, 2007, as  discussed above by Dr. Verne Carrow revealing nonobstructive CAD  with low normal LV systolic function.  The patient was deemed that the  patient's chest pain was noncardiac in etiology.  Labs also confirmed  this as they were negative x3 throughout hospitalization concerning  cardiac enzymes.   On day of discharge, the patient was seen and examined by Dr. Myrtis Ser and  found to be stable.  The patient did have some complaints of right groin  pain post procedure and was reexamined by Christain Sacramento, a nurse  practitioner that evening to evaluate and was found to be stable with no  evidence of bleeding, hematoma, or infection.  The patient was given  pain medication on evaluation by Dr. Myrtis Ser on day of discharge.  The pain  was better and again reexamined with no evidence of bleeding, hematoma,  or infection.  It was found that the patient was  stable for discharge  and will follow with Dr. Myrtis Ser as an outpatient.   DISCHARGE LABORATORY DATA:  Sodium 141, potassium 4.3, chloride 109, CO2  27, glucose 93, BUN 10, and creatinine 0.87.  Hemoglobin 10.7,  hematocrit 32.6, white blood cells 5.6, and platelets 196.  Cardiac  troponin was 0.01, 0.02, and 0.02 respectively with no elevation in  creatinine kinase or CK-MB.  Cholesterol 177, triglycerides 77, HDL 38,  and LDL 124.  Chest x-ray on day of admission, December 09, 2007,  revealed emphysema without acute findings.   DISCHARGE VITAL SIGNS:  Blood pressure 113/59, pulse 64, respirations  15, temperature 97.7, and the patient weighed 56.6 kg.   DISCHARGE MEDICATIONS:  1. Carvedilol 6.25 mg twice a day.  2. Plavix 75 mg daily.  3. Isosorbide mono ER 30 mg daily.  4. Aspirin 81 mg daily.  5. NitroQuick 0.4 sublingual p.r.n.  6. B12 injection monthly.  7. Premarin cream p.r.n.   ALLERGIES:  To SULFA, DILANTIN, PHENERGAN, and COA REDUCTASE INHIBITORS.   FOLLOWUP PLANS AND APPOINTMENT:  1. The patient will follow up with an appointment with Dr. Willa Rough as an outpatient.  2. The patient was being given post cardiac catheterization      instructions with particular emphasis on the right      groin site for evidence of bleeding, hematoma, or signs of      infection.  3. The patient has been advised there will be no changes in her      medications and she is to continue taking them as prior to      admission.  Time spent with the patient to include physician time      35 minutes.      Bettey Mare. Lyman Bishop, NP      Luis Abed, MD, [facility]  Electronically Signed    KML/MEDQ  D:  12/11/2007  T:  12/11/2007  Job:  [id number]   cc:   Gordy Savers, MD

## 2010-06-19 NOTE — Discharge Summary (Signed)
[name], [name]              ACCOUNT NO.:  [id number]   MEDICAL RECORD NO.:  [id number]          PATIENT TYPE:  INP   LOCATION:  3732                         FACILITY:  [facility]   PHYSICIAN:  Valerie A. Felicity Coyer, MDDATE OF BIRTH:  Nov 21, 1922   DATE OF ADMISSION:  07/08/2008  DATE OF DISCHARGE:  07/11/2008                               DISCHARGE SUMMARY   DIAGNOSIS AT THE TIME OF DISCHARGE:  1. Acute right pontine infarct with history of cerebrovascular      accident 1 year ago.  2. Dyslipidemia with allergy to LIPITOR.  3. Hypertension.  4. Osteoarthritis.  5. Vitamin B12 deficiency.  6. History of right total hip replacement.  7. History of right shoulder surgery.  8. Meningioma.  9. Subdural hematoma in 2007 following fall.   HISTORY OF PRESENT ILLNESS:  [name] is an 77 year old female, who  presented with chief complaint of left hand numbness and weakness.  She  has a known history of meningioma and MCA stroke who presented to the ED  with complaints waking on the morning of admission with hand numbness  and weakness.  She had similar episode in 2009, at which time she had an  acute MCA distribution stroke that was felt to be Takotsubo  cardiomyopathy.  She is admitted for further evaluation and treatment.   COURSE OF HOSPITALIZATION:  1. Acute right pontine infarct:  The patient was admitted.  She was      seen in consultation by the stroke team.  They recommended changing      her Plavix and aspirin to Aggrenox.  Carotid duplex was performed      during this admission, which noted no ICA stenosis bilaterally.      Initial CT of the head did not reveal stroke; however, MRI noted      the right frontal lobe punctate infarct and encephalomalacia of the      right occipital lobe, consistent with former surgery.  MRA was      unremarkable.  She was noted to have an LDL of 122, however, is      unable to take LIPITOR due to allergy.  She was also noted to be      mildly  anemic with a hemoglobin of 9.8, which will need ongoing      outpatient monitoring.  The patient is on injections for B12      deficiency.   She was seen by PT and OT and has no skilled needs at the time of  discharge.  We have notified the Cardiology team of the above medication  changes, and I have requested that a 2-D echo be arranged as an  outpatient.   MEDICATIONS AT THE TIME OF DISCHARGE:  1. Coreg 6.25 mg p.o. b.i.d.  2. Monthly B12 injections.  3. Sublingual nitro as needed.  4. Premarin cream as needed.  5. Aggrenox 25/200 one cap p.o. daily for 1 week, then 1 cap p.o.      b.i.d. thereafter.  6. Altace 2.5 mg p.o. daily.   PERTINENT LABORATORY DATA:  At the time of discharge, total  cholesterol  188, LDL 122.  Hemoglobin 9.8, hematocrit 29.4.  BUN 13, creatinine  0.82.   DISPOSITION:  The patient will be discharged home.   FOLLOWUP:  She is to follow up with Dr. Eleonore Chiquito on July 15, 2008, at 2:00 p.m. and to follow up with Dr. Pearlean Brownie in 2 months and  contact the office for an appointment.  [facility] to contact  the patient for a 2-D echo appointment time.   Greater than 30 minutes were spent on discharge planning.      Sandford Craze, NP      Raenette Rover. Felicity Coyer, MD  Electronically Signed    MO/MEDQ  D:  07/11/2008  T:  07/12/2008  Job:  [id number]   cc:   Gordy Savers, MD  Pramod P. Pearlean Brownie, MD  Luis Abed, MD, [facility]

## 2010-06-19 NOTE — Assessment & Plan Note (Signed)
[facility]                            CARDIOLOGY OFFICE NOTE   Sarah, Boyer                       MRN:          [id number]  DATE:07/13/2007                            DOB:          09/27/1922    HISTORY:  [name] has a history of an occipital meningioma followed  at [facility].  She had an MI on Jun 19, 2006, and the culprit lesion was  never found.  She had mild-to-moderate epicardial disease, but no  culprit.  She had some apical ballooning.  It was thought that she may  have had a Takotsubo event.  Her ejection fraction had been in the 25%  to 30% range.  Overtime by history, her ejection fraction had improved.  Her ejection fraction may have been as high as 50%.  I saw her last in  September 2008.  She was stable at that time.   More recently, on Jun 18, 2007, the patient presented with a right  parietal middle cerebral artery infarct.  Ultimately, it was decided  that she would be on aspirin and Plavix.  I do not know the thought  process involved in whether she should be on Coumadin.  However, I do  know that she has had a subarachnoid hemorrhage secondary to a fall in  December 2007 and that she had a surgery for a meningioma removed by Dr.  Tawanna Cooler at [facility] in August 2008.   As the patient was in the hospital with a stroke, she was seen by the  Cardiology team.  It was felt that she may have had some myocardial  injury as an outpatient prior to the admission.  Her ejection fraction  was decreased with her ejection fraction more in the 35% to 40% range,  which seems somewhat worse since July 2008.   At this time, she is doing better.  She is not having chest pain or  shortness of breath.  She has finished her occupational therapy and has  some residual difficulties using her left arm.  She has some mild  shortness of breath at times.   PAST MEDICAL HISTORY:   ALLERGIES:  SULFA, DILANTIN, and PHENERGAN.   MEDICATIONS:  1. B12  shot monthly.  2. Plavix 75 mg.  3. Aspirin 81 mg.  4. Carvedilol 6.25 mg b.i.d.  5. Isosorbide 30 mg daily.   She does not appear to be on an ACE inhibitor.   OTHER MEDICAL PROBLEMS:  See the list below.   REVIEW OF SYSTEMS:  The patient has some slight discomfort in the left  leg.  She and her daughter are concerned that she might have a blood  clot in this area.   Otherwise, review of systems is negative.   PHYSICAL EXAMINATION:  VITAL SIGNS:  Weight is 124 pounds.  Blood  pressure is 113/57 with a pulse of 54.  GENERAL:  The patient is oriented to person, time, and place.  Affect is  normal.  She is here with her daughter.  HEENT:  Reveals no xanthelasma.  She has normal extraocular motion.  NECK:  There  are no carotid bruits.  There is no jugular venous  distention.  LUNGS:  Clear.  Respiratory effort is not labored.  CARDIAC:  Reveals S1 and S2.  There are no clicks or significant  murmurs.  ABDOMEN:  Soft.  She does have decreased use of her left arm.  She has  no significant peripheral edema.  I do not feel any obvious marked  abnormalities in her left lower extremity.   EKG today reveals sinus bradycardia.  There are no new diagnostic EKG  changes.   PROBLEMS:  1. History of an occipital meningioma that has been treated with      surgery at [facility] in 2008.  2. Acute myocardial infarction in May 2008 with no culprit.  There was      apical ballooning in May 2008.  She may have had Takotsubo event.      Her ejection fraction seemed  to have improved over time, but most      recently when she was at the hospital, her ejection fraction was      more in the 35% to 40% range.  I cannot be sure if there was an      outpatient cardiac event or not.  We will adjust her medications      over time for her left ventricular dysfunction.  She is not on an      ACE inhibitor at this time.  I will need to review the [facility] chart      completely to better understand the entire  thought process      concerning her medications.  3. History of enlarged thyroid gland with thyroid abnormalities      followed by Dr. Amador Cunas.  4. History of scattered tiny bilateral pulmonary nodules in the past.  5. History of atrophic gastritis.  6. B12 deficiency treated.  7. History of arthritis.  8. History of an event in the past with dehydration and liver test      malfunction, and there was question of whether a statin could be      implicated.  On this basis, she has not been restarted on a statin,      although we do not know for sure that this caused the problem.  9. Recent neurologic event which was a stroke with a right parietal      middle cerebral artery infarct.  10.History of a subarachnoid hemorrhage secondary to fall in December      2007.  11.Some difficulty in her left leg.  We will do a venous Doppler to be      sure there is no venous thrombosis.     Luis Abed, MD, [facility]  Electronically Signed    JDK/MedQ  DD: 07/13/2007  DT: 07/14/2007  Job #: [id number]   cc:   Gordy Savers, MD  Pramod P. Pearlean Brownie, MD

## 2010-06-19 NOTE — Discharge Summary (Signed)
[name], [name]              ACCOUNT NO.:  [id number]   MEDICAL RECORD NO.:  [id number]          PATIENT TYPE:  INP   LOCATION:  3035                         FACILITY:  [facility]   PHYSICIAN:  Rosalyn Gess. Norins, MD  DATE OF BIRTH:  [birth year]   DATE OF ADMISSION:  09/10/2007  DATE OF DISCHARGE:  09/11/2007                               DISCHARGE SUMMARY   ADMITTING DIAGNOSIS:  Weakness in the left arm.   DISCHARGE DIAGNOSIS:  Transient cerebrovascular event, resolved.   PROCEDURES:  CT scan of the brain which was unremarkable for any  significant change.  No acute changes were noted.   HISTORY OF PRESENT ILLNESS:  The patient is an 77 year old woman with a  complex medical history including history of CVA in May 2009.  On the  day of admission, she had an 8-hour episode of weakness in her left  upper extremity.  She chronically has some numbness and weakness in the  fourth and fifth digit of her left hand, but she now had numbness and  weakness that extended up to the proximal upper extremity.  These  symptoms actually were resolving by the time she appeared in the  emergency department.  She was subsequently admitted to rule out any  acute event.   Please see the H&P and previous records for past medical history, family  history, and social history.  Examination by Dr. Everardo All at admission  revealed the patient to be mildly hypertensive at 150/87.  Vitals were  otherwise normal.  Examination significant for her being alert and  oriented.  Cranial nerves were intact bilaterally.  Muscle strength was  diffusely normal except to minimally subjectively decreased at the left  upper extremity.  Sensation was diffusely intact to touch.   HOSPITAL COURSE:  The patient was admitted to telemetry unit.  She was  observed to have some mild arrhythmia on telemetry which was  asymptomatic.  The patient did have CT scan of the brain on admission  which was negative as noted.  The patient's  admission laboratory was  unremarkable with a normal set of electrolytes, creatinine was 1.0, and  glucose minimally elevated at 117.   With the patient having resolution of weakness in her left arm with CT  scan being normal with her declining any Coumadin therapy, she is  thought to be stable and ready for discharge home.   DISCHARGE PHYSICAL EXAMINATION:  VITAL SIGNS:  Temperature of 97.3,  blood pressure 153/92, heart rate was 59, and respirations were 18.  GENERAL APPEARANCE:  This is a very pleasant, elderly Caucasian woman  who is in no acute distress and very conversant.  HEENT:  Unremarkable.  CHEST:  Clear with no rales, wheezes, or rhonchi.  CARDIOVASCULAR:  The patient had 2+ radial pulse.  She had a regular  rate and rhythm with no abnormalities noted.  ABDOMEN:  Soft and nontender with no tenderness to palpation.  NEUROLOGIC:  The patient is awake, alert, and oriented to person, place,  time, and contact.  Cognitive functions normal.  Cranial nerves II  through XII were grossly intact.  The patient has some mild weakness in  the fourth and fifth digit of her left hand, otherwise is moving her  left upper extremity normally.  She had normal sensation to light touch.  Her reflexes were unremarkable.   DISPOSITION:  The patient is discharged to home.  She will continue on  her current regimen including aspirin and Plavix.  She will see Dr.  Amador Cunas in 7-10 days for followup.  She and Dr. Amador Cunas will  discuss whether there is any need to adjust her blood pressure  medications given that her systolic pressures were in the 150 range both  at admission and also on the day of discharge.   The patient's condition at time of discharge dictation is stable and  improved.      Rosalyn Gess Norins, MD  Electronically Signed     MEN/MEDQ  D:  09/11/2007  T:  09/12/2007  Job:  [id number]   cc:   Gordy Savers, MD

## 2010-06-19 NOTE — Assessment & Plan Note (Signed)
[facility]                            CARDIOLOGY OFFICE NOTE   [name], [name]                       MRN:          [id number]  DATE:07/09/2006                            DOB:          1922/06/03    This is a patient of Dr. Myrtis Ser.   This is a very pleasant, 77 year old, married, white, female patient who  presented to the hospital with an acute ST elevation MI. At cardiac  cath, she was noted to have mild to moderate coronary artery disease  with no critical stenosis or obvious culprit lesion. Ejection fraction  45% suggestive of Tako-tsubo cardiomyopathy. The patient also was found  to have enlarged thyroid gland and scattered bilateral pulmonary nodules  on CT as well as an enlarging meningioma which she is suppose to  followup with Dr. Amador Cunas for. Since she has been home, she has had  some fleeting episodes of chest tightness. She says it is when she  becomes stressed out. 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 Her husband is also total care and she  struggles to take care of him with the assistance of her daughters. When  she develops the chest pain she said she lays down and goes to sleep and  it goes away. She does not have any nitroglycerin at home. She has had  low blood pressures and actually talked to [facility] on the 20th and  he told her to hold it for blood pressures less than 90 systolic. She  has only taken Altace twice since she has been home because of  consistently low blood pressures.   CURRENT MEDICATIONS:  1. Aspirin 325 mg daily.  2. Vitamin B12 once a month.  3. Premarin cream p.r.n.  4. Altace 1.25 b.i.d. but has been on hold.  5. Lipitor 80 mg q.h.s.  6. Librax 5/2.5 mg t.i.d.  7. Carvedilol 6.25 mg b.i.d.   PHYSICAL EXAMINATION:  GENERAL:  This is very pleasant, young looking,  77 year old,  white female in no acute distress.  VITAL SIGNS:  Blood pressure 95/54, pulse 61, weight 122.  NECK:  Without JVD, HJR, bruit or thyroid enlargement.  LUNGS:  Clear anterior, posterior and lateral.  HEART:  Regular rate and rhythm at 60 beats per minute. Normal S1 and S2  with a 1/6 systolic ejection murmur at the left sternal border.  ABDOMEN:  Soft without organomegaly, masses, lesions or abnormal  tenderness.  EXTREMITIES:  The right groin is without hematoma or hemorrhage. The  extremity is without clubbing, cyanosis or edema. She has good distal  pulses.   IMPRESSION:  1. Status post ST elevation myocardial infarction on Jun 19, 2006 with      no culprit lesions at cardiac catheterization, medical therapy      recommended.  2. Probable Tako-tsubo syndrome.  3. Enlarged thyroid gland with lesion in the left thyroid lobe.  4. Scattered tiny bilateral pulmonary nodules on CT.  5. History of abdominal  pain. Found to have atrophic gastritis and      small hiatal hernia on endoscopy on May 18.  6. History of hemangioma.  7. B12 deficiency.  8. Arthritis.   PLAN:  I have told the patient she can stop the Altace if she has not  been able to take it because of her hypotension. She can continue to  take Coreg. I have given her a prescription for sublingual nitroglycerin  with instructions on how to take this. She is scheduled here for LFTs  and lipid profile in a month as well as a followup 2-D echo in August.  Her echo in the hospital showed an ejection fraction of 25-30% with  hypokinesis in the inferior, posterior, mid distal inferior septal, mid  distal lateral, akinesis of the distal anterior septal and apical walls.  Ejection fraction at cath was 45%. She also has an appointment to  followup with Dr. Amador Cunas concerning her thyroid nodule and she sees  somebody at [facility] concerning her meningiomas. She is to call if she  has any problems in the interim and she will see Dr.  Myrtis Ser back in 1  month.      Jacolyn Reedy, PA-C  Electronically Signed      Luis Abed, MD, [facility]  Electronically Signed   ML/MedQ  DD: 07/09/2006  DT: 07/09/2006  Job #: [id number]

## 2010-06-19 NOTE — Cardiovascular Report (Signed)
NAMEAMARRA, [name] NO.:  [id number]   MEDICAL RECORD NO.:  [id number]          PATIENT TYPE:  INP   LOCATION:  2315                         FACILITY:  [facility]   PHYSICIAN:  Jonelle Sidle, MD DATE OF BIRTH:  08-Oct-1922   DATE OF PROCEDURE:  06/19/2006  DATE OF DISCHARGE:                            CARDIAC CATHETERIZATION   REQUESTING CARDIOLOGIST:  Dr. Willa Rough.   INDICATIONS:  [name] is an 77 year old woman with a history of  hypertension as well as traumatic subarachnoid hemorrhage due to closed  head injury.  She has no previously documented history of coronary  artery disease or myocardial infarction.  Her subarachnoid hemorrhage  occurred back in December 2007 based on available information.  She is  now sent urgently to the cardiac catheterization laboratory by Dr. Myrtis Ser,  having presented with sudden onset chest pain earlier this morning  associated with minor ST-segment elevation in the high lateral leads  with mild reciprocal depression in the inferior leads, as well as  abnormal troponin I levels increasing from 0.25 up to 0.92.  She was not  placed on any anticoagulants until further discussion between Dr. Samule Ohm  and Dr. Jordan Likes of neurosurgery, with the recommendation being per Dr. Jordan Likes  that full anticoagulation could be used if needed.  Dr. Samule Ohm was  initially slated to begin the procedure; however, was called to another  procedure and I was asked to proceed forward given the urgency of the  situation.  On my arrival in the cardiac catheterization laboratory the  patient stated that her chest pain had resolved.  The potential risks  and benefits were discussed with her in advance and informed consent was  obtained.   PROCEDURES PERFORMED:  1. Left heart catheterization.  2. Selective coronary angiography.  3. Left ventriculography.   ACCESS AND EQUIPMENT:  The area about the right femoral artery was  anesthetized with 1% lidocaine  and a 6-French sheath was placed in the  right femoral artery following a single anterior wall puncture using the  modified Seldinger technique.  Standard preformed 6-French JL-4 and JR-4  catheters were used for selective coronary angiography and an angled  pigtail catheter was used for left heart catheterization and left  ventriculography.  All exchanges were made over a wire.  Total of 119 mL  Omnipaque were used.  The patient tolerated the procedure well without  obvious complications.   HEMODYNAMICS:  Aorta 113/61 mmHg.  Left ventricle 113/32 mmHg.   ANGIOGRAPHIC FINDINGS:  1. The left main coronary artery gives rise to the left anterior      descending, the circumflex and a ramus intermedius branch.  There      is mild luminal irregularity noted as well as a small area of      calcification, but no obstructive stenosis.  2. The left anterior descending is a medium caliber vessel with four      diagonal branches, the first of which is the largest and bifurcates      proximally.  In the ostium the left anterior descending is      approximately  30% stenosis followed by an area of 40% stenosis      within the proximal vessel.  Otherwise, there are minor luminal      irregularities.  The proximal bifurcating diagonal branch has a 30%      ostial stenosis.  3. There is a relatively large caliber but short ramus intermedius      branch noted.  There is 50% ostial stenosis within this branch.  4. The circumflex coronary artery is a medium caliber vessel with      three obtuse marginal branches.  There is 30% proximal stenosis      noted within the circumflex and otherwise minor luminal      irregularities.  5. The right coronary artery is a medium to large in caliber and      dominant vessel with posterior descending branch and posterolateral      system.  There is 30% stenosis in the proximal vessel followed by      40-50% diffuse stenosis in mid vessel and 40% stenosis in the       distal vessel.   Left ventriculography was performed in the RAO projection and reveals an  ejection fraction of approximately 45% with a large area of apical  ballooning; 1+ mitral regurgitation is noted in the setting of  ventricular ectopy.   DIAGNOSES:  1. Mild to moderate coronary atherosclerosis within the major      epicardial vessels as outlined above.  There are no critical      stenoses or obvious culprit lesion.  2. Apical ballooning with a left ventricular ejection fraction of      approximately 45% and elevated left ventricular end-diastolic      pressure of 32 mmHg.  There is 1+ mitral regurgitation in the      setting of ventricular ectopy.  This is suggestive of a Tako-Tsubo      cardiomyopathy.   DISCUSSION:  I reviewed the results with the patient, her family and Dr.  Myrtis Ser.  At this point would anticipate aggressive medical therapy and  stabilization.      Jonelle Sidle, MD  Electronically Signed     SGM/MEDQ  D:  06/19/2006  T:  06/19/2006  Job:  [id number]   cc:   Luis Abed, MD, [facility]  Gordy Savers, MD

## 2010-06-19 NOTE — Assessment & Plan Note (Signed)
[facility]                            CARDIOLOGY OFFICE NOTE   CHEYENNA, PANKOWSKI                       MRN:          [id number]  DATE:10/24/2006                            DOB:          11-10-1922    [name] is here today for followup.  She is doing very well.  I saw  her last on August 14, 2006, and a 2D echo was ordered.  The study showed  that she has had good return of LV function.  Her ejection fraction  appears to be up to the 55% range.  There is suggestion of some  hypokinesis of the periapical wall.  Otherwise, her echo looks quite  good.  Based on this information, we allowed her to proceed and have her  meningeoma surgery at [facility], and this was done successfully, and she  is doing well.  She returns today with no significant chest pain or  significant problems.   PAST MEDICAL HISTORY:   ALLERGIES:  1. SULFA.  2. DILANTIN.  3. PHENERGAN.   MEDICATIONS:  1. Aspirin.  2. Vitamin B12 monthly.  3. Carvedilol 3.125 daily.  4. Keppra until Tuesday.   OTHER MEDICAL PROBLEMS:  See the extensive list on the note of August 14, 2006.   REVIEW OF SYSTEMS:  She is really doing well, and her review of systems  is negative.   PHYSICAL EXAMINATION:  Blood pressure is 119/70 with a pulse of 82.  Her  weight is 116 pounds.  The patient is oriented to person, time, and place.  Affect is normal.  She is here with her family today.  HEENT:  Reveals no xanthelasma.  She has normal extraocular motion.  There are no carotid bruits.  There is no jugular venous distention.  LUNGS:  Clear.  Respiratory effort is not labored.  CARDIAC EXAM:  Reveals an S1 with an S2.  There are no clicks or  significant murmurs.  ABDOMEN:  Soft.  She has no peripheral edema.  EKG reveals no significant change.   PROBLEMS:  Listed on the note of August 14, 2006.  Problem #2. Patient had an acute myocardial infarction Jun 19, 2006.  The culprit was never clear.   She had apical ballooning suggesting the  possibility of Takotsubo cardiomyopathy.  Her ejection fraction has  responded, and is now almost completely normal with only mild apical  wall motion abnormality.  She is doing well.   She does not need any further cardiac workup.  I will see her back in 1  year.     Luis Abed, MD, [facility]  Electronically Signed    JDK/MedQ  DD: 10/24/2006  DT: 10/24/2006  Job #: [id number]   cc:   Gordy Savers, MD

## 2010-06-19 NOTE — H&P (Signed)
[name], [name]              ACCOUNT NO.:  [id number]   MEDICAL RECORD NO.:  [id number]          PATIENT TYPE:  INP   LOCATION:  3112                         FACILITY:  [facility]   PHYSICIAN:  Gustavus Messing. Orlin Hilding, M.D.DATE OF BIRTH:  April 10, 1922   DATE OF ADMISSION:  06/18/2007  DATE OF DISCHARGE:                              HISTORY & PHYSICAL   This is a code stroke.  Code stroke was called at 10:05, onset of  symptoms at 8 a.m.  CT results known to Neurology at 10:35.  Time of  initial neurologic exam is 10:35.   CHIEF COMPLAINT:  Left arm weakness and numbness.   HISTORY OF PRESENT ILLNESS:  [name] is an 77 year old right-  handed white woman with a remote history of a right posterior  meningioma, which was resected at [facility] by Dr. Angelyn Punt in August 2008.  Symptoms she was having at that time were confusion and a left visual  field cut, though seems to have been improving.  She never had seizures  associated with it.  She never had any weakness or numbness of arms or  legs associated with the meningioma.  She was recovering well.  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  She got up as usual this morning, was  feeling fine, was sitting down to knit an [place] and watch a show on  animals at 8 a.m. when she had a sudden onset of left arm numbness and  weakness.  She was unable to move her arm at all.  There was no seizure  activity.  No loss of consciousness.  She had no language deficit.  She  had no new vision problems.  No face or leg involvement.  911 was called  and she was transported to the emergency room and a code stroke was  called.   REVIEW OF SYSTEMS:  A 13-system review was performed and was positive  for the left visual field cut, and the arm numbness and weakness, and  she has some environmental allergies with pollen.  Otherwise, it is  documented as a negative.   PAST MEDICAL  HISTORY:  Significant for coronary artery disease, right  parietooccipital meningioma which was resected in August 2008 by Dr.  Angelyn Punt at [facility], symptoms of that were some confusion and left visual  field problems.  She has a remote history of what might have been a  trigeminal neuralgia which she was associated with a burning pain.  She  was given Dilantin and then had a reaction to that with what sounds like  coagulopathy and a skin reaction.  She actually developed blood clots  from it.   CURRENT MEDICATIONS:  1. Coreg 3.125 mg daily.  2. Aspirin 325 mg daily.  3. NitroQuick p.r.n.   ALLERGIES:  DILANTIN, SULFA, PHENERGAN, and LIPITOR.   SOCIAL HISTORY:  She is married, lives with her husband and is his  primary caregiver.  She does have children.  Her daughter is with her.  She has never used tobacco, alcohol, or  illicit drugs.   FAMILY HISTORY:  Positive for diabetes in her father, although not well  documented.  She is from [place].   OBJECTIVE:  VITAL SIGNS:  On exam, temperature 97.4, BP 139/84, pulse is  70, respirations 20, and 99% sat.  She weighs about 120 pounds.  HEAD:  Normocephalic and atraumatic.  She has a dent in the right  occipital region at the craniotomy site.  NECK:  Supple without bruits.  LUNGS: Clear to auscultation.  HEART: Regular rate and rhythm.  SKIN:  Dry mucosa moist.  ABDOMEN:  Soft and nontender.  Nutritional status is adequate.  NEUROLOGIC:  She is alert.  She answers 2/2 questions correctly.  Follows 2/2 commands correctly.  Gaze is normal.  She has extinguishing  left visual field cut and gets a 1 for that.  She has a slight right  facial droop, which is minor.  She has a level 3 left upper extremity  strength with no effort against gravity, but she is able to move within  the plane of gravity.  There is no drift in the right upper extremity.  No drift in either lower extremities.  There is no ataxia on finger-to-  nose.  Sensory, she has  some mild decreased sensation on the left.  There is no aphasia.  No dysarthria.  She has a slight extinction  visually, but not to sensory, gets a 1 for that, so her total score is  7.  Some of these findings, however, may be relative to her old  meningioma namely the vision.  The right facial droop is not consistent  with her picture currently nor would it be consistent with a right  meningiomas, so it is difficult to tell where that is coming from, but  the weakness and numbness on the left are acute.   LABORATORY:  CBG is 121.  BMET is pending.  White cells 7.7, hemoglobin  10.7, hematocrit 31.5, and platelets 220.  PT 12.0, INR 0.9.  CT of the  head shows healed craniotomy site with encephalomalacia, mild small  vessel disease, and nothing acute.  No bleeds.   ASSESSMENT:  New-onset left arm numbness and weakness at 8 a.m.  No  seizures.  No loss of consciousness.  She does have a remote right  posterior meningioma resection with a visual field cut, this has been  gradually improving.  She never had seizures with that.  She does have  coronary artery disease.  She is within the 3-hour window for IV tPA.   PLAN:  We will give  full-dose IV TPA with her consent.  We will admit  her for a workup to include MRI, carotid Doppler, 2D echo.  Consider  Aggrenox or Plavix since this occurred on aspirin.  Stroke education was  given.  She will have a swallow evaluation.      Catherine A. Orlin Hilding, M.D.  Electronically Signed     CAW/MEDQ  D:  06/18/2007  T:  06/19/2007  Job:  [id number]

## 2010-06-22 NOTE — Op Note (Signed)
[facility]  Patient:    [name], [name] Visit Number: [id number] MRN: [id number]          Service Type: DSU Location: [phone] Attending Physician:  Delsa Bern Dictated by:   Lorne Skeens. Hoxworth, M.D. Proc. Date: 01/13/01 Admit Date:  01/13/2001                             Operative Report  INCOMPLETE  PREOPERATIVE DIAGNOSIS:  Cholelithiasis and cholecystitis.  POSTOPERATIVE DIAGNOSIS:  Cholelithiasis and cholecystitis.  OPERATION PERFORMED:  Laparoscopic cholecystectomy with intraoperative cholangiogram.  SURGEON:  Sharlet Salina T. Hoxworth, M.D.  ASSISTANT:  Donnie Coffin. Samuella Cota, M.D.  ANESTHESIA:  General.  INDICATIONS FOR PROCEDURE:  The patient is a 77 year old female with persistent episodic right upper quadrant abdominal pain and nausea.  She has had a recent ultrasound showing small nonshadowing gallstones.  She has had an otherwise extensive work-up that is negative.  She is felt to have symptomatic cholelithiasis.  Laparoscopic cholecystectomy with cholangiogram has been recommended and accepted.  The nature of the procedure, its indications and risks of bleeding, infection, bile leak and bile duct injury were discussed and understood.  The patient is now brought to the operating room for this procedure.  DESCRIPTION OF PROCEDURE:  The patient was brought to the operating room and placed in supine position on the operating table and general endotracheal anesthesia was induced.  The abdomen was steriley prepped and draped.  Local anesthesia was used to infiltrate the trocar sites.  A 1 cm incision was made in the umbilicus and dissection carried down to the midline fascia.  This was sharply incised for 1 cm and the peritoneum entered under direct vision. Through a mattress suture of 0 Vicryl, the Hasson trocar was placed and pneumoperitoneum was established.  Under direct vision a 10 mm trocar was placed in the  subxiphoid area and two 5 mm trocars in the right subcostal margin.  The gallbladder was visualized and did not appear acutely inflamed. The fundus was grasped and elevated up over the liver and the infundibulum retracted inferolaterally.  Fibrofatty tissue was stripped down off the neck of the gallbladder toward the porta hepatis.  The distal gallbladder and Calots triangle was completely dissected.  The cystic duct gallbladder junction was identified and dissected 360 degrees.  When the anatomy was clear, the cystic duct was clipped at the gallbladder junction and the cystic artery was doubly clipped proximally and clipped distally.  Operative cholangiograms were then obtained through the cystic duct.  This showed good filling and normal-sized common bile duct and intrahepatic ducts. Dictated by:   Lorne Skeens. Hoxworth, M.D. Attending Physician:  Delsa Bern DD:  01/13/01 TD:  01/13/01 Job: [id number]

## 2010-06-22 NOTE — Discharge Summary (Signed)
[facility]  Patient:    [name], [name] Visit Number: [id number] MRN: [id number]          Service Type: MED Location: 3W [phone] Attending Physician:  Rogelia Boga Dictated by:   Titus Dubin. Alwyn Ren, M.D. [facility] Admit Date:  08/18/2001 Discharge Date: 08/21/2001   CC:         Gordy Savers, M.D. [facility]  Nathen May, M.D., [facility]   Discharge Summary  ADMITTING DIAGNOSES: 1. Chest pain syndrome, rule out coronary insufficiency. 2. Recurrent abdominal pain. 3. B12 deficiency. 4. Degenerative joint disease.  DISCHARGE DIAGNOSES: 1. Chest pain syndrome, rule out coronary insufficiency. 2. Recurrent abdominal pain. 3. B12 deficiency. 4. Degenerative joint disease.  BRIEF HISTORY:  Ms. Pallett is a 77 year old white female, who developed substernal chest pain, manifested as severe heaviness or pressure approximately 15 hours prior to admission associated with diaphoresis, weakness, and anxiety.  She had shortness of breath but no nausea.  There has been some waxing and waning of the intensity, but the pain has been persistent.  EKG revealed moderate inferior ST and T wave changes with a few PVCs.  In March 2001, she had a heart catheterization which revealed normal left ventricular function and mild mitral valve prolapse.  She had a 25% LAD lesion and a proximal LAD of 40% with 50% stenosis of a diagonal branch and 50% stenosis of the ramus intermedius of the left circumflex.  Normal RCA proximally with a 25% mid right coronary aortic lesion.  She had a laparoscopic cholecystectomy January 13, 2001, with an intraoperative cholangiogram.  Initially she improved but over several months, has had recurrent pain.  She has had two abdominal CT scans, upper and lower endo, and the diagnosis has been irritable bowel syndrome.  She is on monthly B12 injections.  ALLERGIES:  She has multiple allergies or intolerances including  DILANTIN, PHENOBARBITAL, SULFA, NIACIN, ALEVE.  She was placed on heparin protocol.  Serial CPKs were all within normal limits as was a single troponin.  Chemistry revealed a mildly elevated glucose of 114 with normal renal function.  CBC and differential showed a hematocrit of 34.7 with a subsequent drop to 32.  A chest x-ray showed stable cardiomegaly with COPD with no acute abnormality. EKG revealed sinus bradycardia with premature atrial complexes with an aberrant conduction.  There were no ischemic changes.  She was seen in consultation by cardiology.  He recommended an adenosine stress test which was negative for ischemia and which revealed an ejection fraction of 71%.  She was started on Lopressor 25 mg twice a day.  Initially, her blood pressure was 134/67 but, on the day of discharge, blood pressure was 106/57 and pulse 52-56.  She stated the morning of discharge, she had some itching after taking the medications and declined to take the Lopressor as an outpatient.  Suggested possibly decreasing the dose to 12.5 mg 1 two times a day, but she declined this as well.  She had been taking aspirin at home, and it was recommended that she take enteric-coated aspirin 325 mg with a meal.  She was given the option of taking Protonix for possible reflux, although endoscopies have been normal in the past.  She will be given option of taking NuLev sublingually as needed for abdominal pain.  She and her daughter were quite emphatic that she needed an MRI of the abdomen, as a friend had had similar problems, and this was the only diagnostic test.  I  explained that the gastroenterologist and radiologist would have recommended this had the clinical picture, endoscopic findings, or radiographic findings recommended this.  They were obviously not pleased with this.  I recommended they discuss this with Dr. Amador Cunas.  I called him myself and asked him to discuss this with them upon  return to his office in approximately one week.  She did have a laparoscopic cholecystectomy; it is unclear whether she has metal staples which may be a relative contraindication of the MRI.  Dr. Amador Cunas and gastroenterology can confer and discuss with the radiologist actually whether MRI would be of benefit.  Her discharge status is improved.  Prognosis appears to be good.  DIET:  Avoid excessive sodium intake and to avoid factors such as aspirin other than one enteric-coated aspirin daily with a meal and to avoid nonsteroidals, alcohol, peppermint, tobacco, and caffeine.  ACTIVITIES:  As tolerated.  She has a treadmill and uses it at home.  She wilL be encouraged to continue to do this, exercising 30-45 minutes 3-4 times a week, building up to this level over 6-8 weeks. Dictated by:   Titus Dubin. Alwyn Ren, M.D. [facility] Attending Physician:  Rogelia Boga DD:  08/21/01 TD:  08/27/01 Job: [id number]

## 2010-06-22 NOTE — Discharge Summary (Signed)
[place]  Patient:    [name], [name] Visit Number: [id number] MRN: [id number]          Service Type: MED Location: [phone] Attending Physician:  Duke Salvia Dictated by:   Janora Norlander, N.P. Admit Date:  11/29/2000 Discharge Date: 12/01/2000   CC:         Gordy Savers, M.D. [facility]             Gerrit Friends. Dietrich Pates, M.D. [facility]                           Discharge Summary  ADMISSION DIAGNOSIS:  Atypical chest pain.  DISCHARGE DIAGNOSES: 1. Urinary tract infection. 2. Left arm weakness assessed by primary M.D. on November 25, 2000 as an    outpatient. 3. Abdominal pain, recurrent.  PROCEDURES DURING ADMISSION: 1. On December 01, 2000, ultrasound of the abdomen performed, results pending,    at point of dictation. 2. KUB, results showed normal bowel pattern with bony demineralization. 3. Head CT reports minimal white matter, small vessel disease, questionable    old infarct left exterior capsule.  Negative acute.  Positive for carotid    vertebral arterial calcification.  DIAGNOSTIC STUDIES:  Cardiac enzymes negative times two.  TSH 1.960, T3 164.6, T4 10.0, all normal.  Hemoglobin A1C 5.9, normal.  Lipase 27, amylase 35, both normal.  Ferritin 8, low.  TIBC 300.  Percent saturation 11%, low.  Fe 42, borderline low.  RPR negative.  Urine 100,000+ E. coli now on Cipro.  Lipid profile:  Cholesterol 203, HDL 41, LDL 21, triglycerides 204.  CBC:  WBC 6.3, H&H 11.0, and 33.4.  Folate was over 20.0.  B12 was at 1205.  ALLERGIES:  PROPANOL, PHENERGAN, SULFA, and DILANTIN.  DISCHARGE MEDICATIONS:  She is to return to her aspirin 325 mg once a day, vitamin B12 injections one per month, Cipro 250 mg one p.o. b.i.d. on Wednesday for the last in a three-day course to treat her UTI.  HOSPITAL COURSE:  This patient was admitted on November 30, 2000 after reporting that she had paroxysmal left arm numbness which was associated  with chest pressure, diaphoresis, and weakness.  She had no palpitations or sense of loss of consciousness.  Her ECG was negative for ischemia.  Her cardiac markers were negative.  She had a catheterization in 3/01 by Dr. Gerri Spore that revealed normal LVEF and non-obstructive CAD.  In the ER, her ECG did reveal first degree AV block.  She was in normal sinus rhythm.  The above laboratory work was collected, and her pain does not appear to be cardiac in nature.  Dr. Dietrich Pates suggested that at this point there was no further in-hospital workup of the chest anticipated, and that she would recommend NTG spray at discharge.  The care of the patient was transferred to internal medicine.  An abdominal ultrasound was requested, and that was performed this morning. However, the general consensus is that her workup can continue on an outpatient basis.  Today, the patient reports that her abdominal pain comes and goes.  It is worse when she has milk products and greasy foods and when she is under stress.  Currently, she is taking care of an infirmed husband, and she is eager to return home.  DISCHARGE PHYSICAL EXAMINATION:  VITAL SIGNS:  Temperature is 98.2, blood pressure 138/68, pulse 58, respirations 20, and O2 saturation 96% on room air.  GENERAL:  She is awake, alert, oriented, and in the presence of her granddaughter.  CARDIOVASCULAR:  Regular rate and rhythm.  LUNGS:  Clear.  There is no wheeze.  There are no crackles.  ABDOMEN:  Soft with bowel sounds equal in all four quadrants.  She is tender, but she is not as tender as yesterday per her report.  EXTREMITIES:  No edema.  MUSCULOSKELETAL:  Strength is 5/5 times four.  ASSESSMENT AND PLAN: 1. Urinary tract infection, Escherichia coli.  She is on day #2 of Cipro    today.  She will continue her antibiotics to complete a three-day course    tomorrow. 2. Abdominal pain.  The abdominal ultrasound was done this morning.   Results    are pending.  She can follow up with Gordy Savers, M.D. later on    this week. 3. Anemia.  We know that the Cipro may interact with any iron supplements.    However, she will only receive one more day of the Cipro, and her anemia is    being followed up on an outpatient basis.  She is stable for discharge at    this point. Dictated by:   Janora Norlander, N.P. Attending Physician:  Duke Salvia DD:  12/01/00 TD:  12/02/00 Job: [id number]

## 2010-06-22 NOTE — Cardiovascular Report (Signed)
[place]. [facility]  Patient:    [name], [name]                       MRN: [id number] Proc. Date: 04/24/99 Attending:  Daisey Must, M.D. [facility] CC:         Cardiac Catheterization Laboratory             Gordy Savers, M.D. [facility]             Madolyn Frieze. Jens Som, M.D. [facility]                        Cardiac Catheterization  PROCEDURE PERFORMED:  Left heart catheterization with coronary angiography and eft ventriculography.  CARDIOLOGIST:  Daisey Must, M.D.  INDICATIONS:  [name] is a 77 year old woman admitted with prolonged chest pain. She had no electrocardiogram changes and normal CPK-MBs, but elevated troponin levels.  She was thus referred for a cardiac catheterization.  DESCRIPTION OF PROCEDURE:  A 6-French sheath was placed in the right femoral artery.  The standard Judkins 6-French catheters were utilized.  Contrast was Omnipaque.  There were no complications.  RESULTS: HEMODYNAMICS: Left ventricular pressure:  166/20. Aortic pressure:  166/78. No aortic valve gradient.  LEFT VENTRICULOGRAM:  Wall motion is normal.  The ejection fraction is calculated at 63%.  There appears to be mild prolapse of the posterior and mitral valve leaflet.  CORONARY ARTERIOGRAPHY:  (Right dominant): 1. Left main coronary artery:  The left main coronary is normal. 2. Left anterior descending coronary artery:  The left anterior descending has    an ostial 25% stenosis.  The proximal LAD has a 40% stenosis and the mid to    distal LAD has a 30% stenosis.  There is a normal-sized diagonal branch    arising from the proximal LAD, which has a 50% stenosis at its origin. 3. Left circumflex coronary artery:  Gives rise to a small ramus intermedius,    and three normal-sized obtuse marginal branches.  The ramus intermedius has    a 50% stenosis at its origin. 4. Right coronary artery:  The right coronary artery gives rise to a normal-    sized posterior  descending coronary artery and two small posterolateral    branches.  There is minor irregularity in the proximal vessel, and a 25%    stenosis in the mid-right coronary artery.  IMPRESSION: 1. Normal left ventricular systolic function with mild mitral valve prolapse. 2. Nonobstructive coronary artery disease. DD:  04/24/99 TD:  04/25/99 Job: [id number]

## 2010-06-22 NOTE — H&P (Signed)
[name], ZAKRZEWSKI NO.:  [id number]   MEDICAL RECORD NO.:  [id number]          PATIENT TYPE:  EMS   LOCATION:  MAJO                         FACILITY:  [facility]   PHYSICIAN:  Donalee Citrin, M.D.        DATE OF BIRTH:  05/13/1922   DATE OF ADMISSION:  01/18/2006  DATE OF DISCHARGE:                              HISTORY & PHYSICAL   REASON FOR ADMISSION:  Closed head injury, traumatic subarachnoid  hemorrhage, right occipital lobe swelling, dizziness with nausea.   HISTORY OF PRESENT ILLNESS:  Patient is a very pleasant 77 year old  female who is followed medically by Dr. Amador Cunas, who recently  underwent a rotator cuff surgery per Dr. Ophelia Charter, but was doing well until  Thursday evening.  Had a fall striking the back of her head.  She lost  consciousness for about 3 or 4 minutes.  When she came to, she had a lot  of pain in the occiput as well as headaches.  Over the last couple of  days, she has had some dizziness and nausea that has waxed and waned,  then brought her to the ER today.  Currently, she is experiencing  dizziness and headaches that rate about 8.5/10, a little bit of nausea.  She has not taken any medicine for it except for aspirin.  She denies  any visual changes, denies any numbness or tingling in arms or legs,  denies any blackout episodes today.  She says it happened to her one  other time.  She [place] approximately a year ago with similar  sequelae.   PAST MEDICAL HISTORY:  Fairly unremarkable.  She has been very healthy  throughout her life with some history of hypertension, but she has been  taken off her medications in the last month or so by Dr. Dorthey Sawyer leading  into her rotator cuff repair.   SURGICAL HISTORY:  Includes:  1. Hip surgery.  2. Rotator cuff surgery.  3. Cholecystectomy.  4. Hysterectomy.   Since her cholecystectomy, she has had a lot of abdominal pain that they  are continuing to work up, but they have been unsuccessful  so far.   ALLERGIES:  DILANTIN, PHENERGAN, and SULFA.   CURRENT MEDICATIONS:  She currently takes no medications.  She lives  with her ailing husband as well as her brother.   PHYSICAL EXAMINATION:  GENERAL:  The patient is a very pleasant, awake,  alert 77 year old female in no acute distress.  HEENT:  Within normal limits.  Pupils are equally round and react to  light.  Extraocular movements are intact.  NEUROLOGICAL:  Cranial nerves are otherwise intact.  She has 5/5  strength in her deltoids, biceps, triceps, wrist flexors, hand  intrinsics, also 5/5 strength in iliopsoas, quadriceps, gastrocnemius.  She also has normal cerebellar exam.  She has no apparent field cut,  although this was difficult to assess.  Disks are unable to be  visualized.   The remainder of her exam is within normal limits.   Her CT scan does show some right occipital swelling, some sulcal  effacement, some mild  traumatic subarachnoid hemorrhage.  She has what  appears to be a well-circumscribed, potentially dural-based mass in the  superior aspect of her occipital lobe that the subarachnoid hemorrhage  is around.  This may represent an underlying meningioma/metastasis,  dural based, that may have been there; it is hard to know.  The CT scan  gives a hint of it, but it is nondiagnostic.  It could just be a  layering lenticular form of the blood, so we need to follow this up with  an MRI scan and over the weekend we may not be able to that, so we will  defer that as an outpatient if nobody is available to do that tomorrow.  If not, then I am going to get a followup CT scan in the morning to rule  out any additional swelling there, but we will admit her for 23-hour  observation, treat her nausea, get her up and ambulate her, and possibly  let her go home in the morning under the care of her daughter and the  rest of her family.           ______________________________  Donalee Citrin, M.D.     GC/MEDQ  D:   01/18/2006  T:  01/19/2006  Job:  [id number]

## 2010-06-22 NOTE — H&P (Signed)
[facility]  Patient:    DAWN, CONVERY Visit Number: [id number] MRN: [id number]          Service Type: MED Location: 3W [id number] Attending Physician:  Rogelia Boga Dictated by:   Gordy Savers, M.D. [facility] Admit Date:  08/18/2001                           History and Physical  CHIEF COMPLAINT:  Chest pain.  HISTORY OF PRESENT ILLNESS:  The patient is a 77 year old, white female who was stable until approximately 15 hours ago.  At that time, she developed substernal chest pain described as a severe heaviness or pressure.  This was associated with some diaphoresis, weakness and anxiety.  She did admit to some shortness of breath, but no real nausea.  The symptoms have fluctuated somewhat, but the pain has been persistent.  Due to persistent chest pain, she presented to the office for evaluation where electrocardiogram revealed some moderate inferior ST and T wave changes and a few PVCs.  She was complaining of persistent pain.  She is now admitted for further evaluation and treatment of her chest pain syndrome to exclude an acute MI.  The patient has a history of intermittent chest pain.  She was evaluated in 1988, 1996, and more recently in March 2001, and October 2002, for evaluation of chest pain.  In March 2001, the patient did have a heart catheterization repeated that revealed normal LV systolic function and mild mitral valve prolapse.  She had nonobstructive coronary artery disease with osteal 25% LAD lesion and a proximal LAD of 40% with a distal LAD of 30%.  She had a 50% stenosis of a normal size diagonal branch.  She had a 50% stenosis of the ramus intermedius of the left circumflex coronary artery disease and a normal RCA proximally and a 25% mid coronary artery lesion.  PAST MEDICAL HISTORY: 1. Recurrent chest pain with hospitalization on three prior occasions which    has included two heart catheterizations.  She has a  history of mitral valve    prolapse. 2. Recurrent abdominal pain more recently.  She underwent laparoscopic    cholecystectomy on January 13, 2001, with intraoperative cholangiogram.    The patient initially improved, but over the past several months, has had    recurrent pain.  She has had a full GI evaluation which has included two CT    abdominal scans, upper and lower endoscopy.  It was felt that the patient    had irritable bowel syndrome. 3. History of B12 deficiency. 4. Osteoarthritis. 5. Remote history of seizure disorder, but the patient has been stable off    medications for decades.  PAST SURGICAL HISTORY: 1. Hysterectomy in 1961. 2. Bladder tack in 1984. 3. Hip arthroplasty in January 2002. 4. Right rotator cuff repair in 1993. 5. Remote appendectomy.  MEDICATIONS: 1. Daily aspirin. 2. Monthly B12 injections.  ALLERGIES:  DILANTIN, PHENOBARBITAL, SULFA, NIACIN and ALEVE.  REVIEW OF SYSTEMS:  Otherwise noncontributory.  SOCIAL HISTORY:  She is married with four children and a number of grandchildren.  FAMILY HISTORY:  Father died at 74.  Mother died at 57 of unclear causes.  Two brothers and two sisters.  One sister died of a probable stroke.  PHYSICAL EXAMINATION:  GENERAL:  Well-developed, elderly, white female who appeared to be slightly anxious, but in no acute distress.  SKIN:  Warm and dry without diaphoresis.  VITAL  SIGNS:  Stable with blood pressure 130/76, pulse 76.  HEENT:  Normal pupil responses.  Conjunctivae clear.  ENT negative.  Dentures were in place.  NECK:  No bruits or adenopathy.  No thyroid enlargement.  CHEST:  Clear.  BREASTS:  Negative.  CARDIAC:  S1, S2 normal without murmurs, rubs or gallops.  No clicks.  ABDOMEN:  Soft, nontender without organomegaly.  Bowel sounds active.  EXTREMITIES:  Intact peripheral pulses.  NEUROLOGIC:  Negative.  IMPRESSION: 1. Chest pain syndrome, rule out coronary insufficiency. 2. History  of recurrent abdominal pain. 3. B12 deficiency. 3. Degenerative joint disease.  PLAN: 1. Admit to the hospital. 2. Serial cardiac enzymes will be reviewed. 3. EKG will be reported in the morning. 4. If these studies are negative, we will consider an inpatient or possibly    outpatient Cardiolite stress test. Dictated by:   Gordy Savers, M.D. [facility] Attending Physician:  Rogelia Boga DD:  08/18/01 TD:  08/20/01 Job: [phone] [id number]

## 2010-07-17 ENCOUNTER — Ambulatory Visit (INDEPENDENT_AMBULATORY_CARE_PROVIDER_SITE_OTHER): Payer: Medicare Other | Admitting: Internal Medicine

## 2010-07-17 DIAGNOSIS — D510 Vitamin B12 deficiency anemia due to intrinsic factor deficiency: Secondary | ICD-10-CM

## 2010-07-17 MED ORDER — CYANOCOBALAMIN 1000 MCG/ML IJ SOLN
1000.0000 ug | Freq: Once | INTRAMUSCULAR | Status: AC
  Administered 2010-07-17: 1000 ug via INTRAMUSCULAR

## 2010-07-20 ENCOUNTER — Encounter: Payer: Medicare Other | Attending: Physical Medicine & Rehabilitation

## 2010-07-20 ENCOUNTER — Ambulatory Visit: Payer: Medicare Other | Admitting: Physical Medicine & Rehabilitation

## 2010-07-20 DIAGNOSIS — G8320 Monoplegia of upper limb affecting unspecified side: Secondary | ICD-10-CM

## 2010-07-20 DIAGNOSIS — M25529 Pain in unspecified elbow: Secondary | ICD-10-CM

## 2010-07-20 DIAGNOSIS — M25519 Pain in unspecified shoulder: Secondary | ICD-10-CM | POA: Insufficient documentation

## 2010-07-20 DIAGNOSIS — IMO0001 Reserved for inherently not codable concepts without codable children: Secondary | ICD-10-CM | POA: Insufficient documentation

## 2010-07-20 DIAGNOSIS — M5382 Other specified dorsopathies, cervical region: Secondary | ICD-10-CM

## 2010-07-20 DIAGNOSIS — M25819 Other specified joint disorders, unspecified shoulder: Secondary | ICD-10-CM | POA: Insufficient documentation

## 2010-07-23 NOTE — Assessment & Plan Note (Signed)
REASON FOR VISIT:  Left shoulder pain.  An 77 year old female with prior stroke causing left hemiparesis.  She also has a history of meningioma.  Her main complaints for me have been pain in the shoulder, in the left axillary region, in the left shoulder blade region.  She is doing very well.  Last time, I saw her after she went through physical therapy trigger point injections, counterirritant cream.  She is working in the yard at that time, but now has had some worsening once again.  She states she is kept up with her exercises. She has kept up with her counterirritant cream.  She does not like to use any other type of medicines except some Tylenol which is not particularly helpful.  She really does not report any neck pain.  She has not had any falls.  No upper extremity numbness, but has had some weakness around the shoulder.  FUNCTIONAL STATUS:  He is independent, retired.  REVIEW OF SYSTEMS:  In addition to above has had some diarrhea, abdominal pain, occasional shortness of breath.  PAST HISTORY:  Hip replacement on the right, rotator cuff replacement on the right.  SOCIAL HISTORY:  Widowed, lives with her daughter.  PHYSICAL EXAMINATION:  VITAL SIGNS:  Blood pressure 137/54, pulse 52, respirations 18, O2 sat 98% on room air. GENERAL:  Elderly female in no acute distress.  She has mild atrophy around the shoulder girdle area, left infraspinatus.  She has tenderness to palpation over the infraspinatus over the medial scapular border, as well as the teres minor and latissimus area on the left side.  There is no evidence of skin lesions. MUSCULOSKELETAL:  Her up extremity strength is 4- in the external rotators of the shoulder, otherwise 5/5. Sensation intact to pinprick and light touch.  Her neck range of motion is 75% forward flexion, extension, lateral rotation and bending. Spurling's negative.  She has hyperactive reflexes 3+ bilateral biceps, triceps, brachioradialis,  knee, and 2+ at the ankle.  IMPRESSION: 1. Left upper quadrant shoulder pain, may be multifactorial.     Certainly, has some myofascial pain syndrome and has responded in     the past to this, but seems not to be helping.  The only other     thing that we can do in addition, would be to repeat some trigger     point injections and possibly go through therapy once again,     although it was recently done. 2. In terms of her spasticity, certainly has a history of meningioma     as well as cerebrovascular accident, but certainly cervical     stenosis is an interferential, and this can cause some     recalcitrant, left shoulder pain.  I do not think she has any     rotator cuff syndrome that I can appreciate at least in the     supraspinatus area certainly if infraspinatus tear is also in the     differential, we could check ultrasound on that should her MRI of     the cervical spine be nonrevealing.  In terms of treatment, we will trial her with some Voltaren gel. Discussed with patient and daughter, agree with plan.  I will see her back in approximately 1 month for followup.    Erick Colace, M.D. Electronically Signed   AEK/MedQ D:  07/20/2010 10:57:42  T:  07/20/2010 23:30:34  Job #:  [id number]

## 2010-07-31 ENCOUNTER — Ambulatory Visit (INDEPENDENT_AMBULATORY_CARE_PROVIDER_SITE_OTHER): Payer: Medicare Other | Admitting: Internal Medicine

## 2010-07-31 ENCOUNTER — Encounter: Payer: Self-pay | Admitting: Internal Medicine

## 2010-07-31 DIAGNOSIS — I10 Essential (primary) hypertension: Secondary | ICD-10-CM

## 2010-07-31 DIAGNOSIS — I69959 Hemiplegia and hemiparesis following unspecified cerebrovascular disease affecting unspecified side: Secondary | ICD-10-CM

## 2010-07-31 DIAGNOSIS — D510 Vitamin B12 deficiency anemia due to intrinsic factor deficiency: Secondary | ICD-10-CM

## 2010-07-31 MED ORDER — CARVEDILOL 6.25 MG PO TABS: ORAL_TABLET | ORAL | Status: DC

## 2010-07-31 NOTE — Progress Notes (Signed)
  Subjective:    Patient ID: [name], female    DOB: 01-Aug-1922, 77 y.o.   MRN: [id number]  HPI  77 year old patient who is seen today for followup. She has a history of B12 deficiency and receives monthly B12 injections. She has a prior history of a meningioma. She continues to complain of left shoulder and right upper quadrant pain. Her main concern today is a nodule in the left lateral neck area. She is scheduled for a MRI of the C-spine next week. She has several vascular disease which has been quite stable. Denies any focal neurological symptoms; she remains on aspirin and Persantine    Review of Systems  Constitutional: Negative.   HENT: Negative for hearing loss, congestion, sore throat, rhinorrhea, dental problem, sinus pressure and tinnitus.   Eyes: Negative for pain, discharge and visual disturbance.  Respiratory: Negative for cough and shortness of breath.   Cardiovascular: Negative for chest pain, palpitations and leg swelling.  Gastrointestinal: Positive for abdominal pain. Negative for nausea, vomiting, diarrhea, constipation, blood in stool and abdominal distention.  Genitourinary: Negative for dysuria, urgency, frequency, hematuria, flank pain, vaginal bleeding, vaginal discharge, difficulty urinating, vaginal pain and pelvic pain.  Musculoskeletal: Positive for arthralgias. Negative for joint swelling and gait problem.  Skin: Negative for rash.  Neurological: Negative for dizziness, syncope, speech difficulty, weakness, numbness and headaches.  Hematological: Negative for adenopathy.  Psychiatric/Behavioral: Negative for behavioral problems, dysphoric mood and agitation. The patient is not nervous/anxious.        Objective:   Physical Exam  Constitutional: She is oriented to person, place, and time. She appears well-developed and well-nourished. No distress.  HENT:  Head: Normocephalic.  Right Ear: External ear normal.  Left Ear: External ear normal.    Mouth/Throat: Oropharynx is clear and moist.  Eyes: Conjunctivae and EOM are normal. Pupils are equal, round, and reactive to light.  Neck: Normal range of motion. Neck supple. No thyromegaly present.       The left anterolateral neck was felt to be normal. The area of concern was probably the thyroid cartilage  Cardiovascular: Normal rate, regular rhythm, normal heart sounds and intact distal pulses.   Pulmonary/Chest: Effort normal and breath sounds normal.  Abdominal: Soft. Bowel sounds are normal. She exhibits no mass. There is no tenderness.  Musculoskeletal: Normal range of motion.  Lymphadenopathy:    She has no cervical adenopathy.  Neurological: She is alert and oriented to person, place, and time.  Skin: Skin is warm and dry. No rash noted.  Psychiatric: She has a normal mood and affect. Her behavior is normal.          Assessment & Plan:   B12 deficiency. Cerebrovascular disease stable hypertension We'll decrease her Coreg to one half tablet twice daily otherwise her medical regimen unchanged MRI of the cervical spine as scheduled

## 2010-07-31 NOTE — Patient Instructions (Signed)
Limit your sodium (Salt) intake  Cervical MRI as scheduled  Return office visit is scheduled

## 2010-08-07 ENCOUNTER — Telehealth: Payer: Self-pay | Admitting: *Deleted

## 2010-08-07 DIAGNOSIS — R911 Solitary pulmonary nodule: Secondary | ICD-10-CM

## 2010-08-07 NOTE — Telephone Encounter (Signed)
Message copied by Salli Quarry on Tue Aug 07, 2010  5:32 PM ------      Message from: Salli Quarry      Created: Mon May 07, 2010  9:27 AM       Ct chest without contrast to f/u nodules.

## 2010-08-14 ENCOUNTER — Encounter: Payer: Self-pay | Admitting: Internal Medicine

## 2010-08-14 ENCOUNTER — Ambulatory Visit (INDEPENDENT_AMBULATORY_CARE_PROVIDER_SITE_OTHER): Payer: Medicare Other | Admitting: Internal Medicine

## 2010-08-14 VITALS — BP 80/50 | Temp 98.0°F | Wt 120.0 lb

## 2010-08-14 DIAGNOSIS — R5381 Other malaise: Secondary | ICD-10-CM

## 2010-08-14 DIAGNOSIS — D510 Vitamin B12 deficiency anemia due to intrinsic factor deficiency: Secondary | ICD-10-CM

## 2010-08-14 DIAGNOSIS — R5383 Other fatigue: Secondary | ICD-10-CM

## 2010-08-14 DIAGNOSIS — I2510 Atherosclerotic heart disease of native coronary artery without angina pectoris: Secondary | ICD-10-CM

## 2010-08-14 DIAGNOSIS — M1990 Unspecified osteoarthritis, unspecified site: Secondary | ICD-10-CM

## 2010-08-14 LAB — BASIC METABOLIC PANEL
CO2: 28 mEq/L (ref 19–32)
Calcium: 9.1 mg/dL (ref 8.4–10.5)
Creatinine, Ser: 1 mg/dL (ref 0.4–1.2)
Sodium: 143 mEq/L (ref 135–145)

## 2010-08-14 LAB — CBC WITH DIFFERENTIAL/PLATELET
Basophils Relative: 0.6 % (ref 0.0–3.0)
Eosinophils Absolute: 0.2 10*3/uL (ref 0.0–0.7)
Eosinophils Relative: 3.6 % (ref 0.0–5.0)
Hemoglobin: 11.4 g/dL — ABNORMAL LOW (ref 12.0–15.0)
Lymphocytes Relative: 21.3 % (ref 12.0–46.0)
Monocytes Relative: 11.8 % (ref 3.0–12.0)
Neutro Abs: 4.1 10*3/uL (ref 1.4–7.7)
Neutrophils Relative %: 62.7 % (ref 43.0–77.0)
RBC: 3.58 Mil/uL — ABNORMAL LOW (ref 3.87–5.11)
WBC: 6.5 10*3/uL (ref 4.5–10.5)

## 2010-08-14 LAB — HEPATIC FUNCTION PANEL
ALT: 13 U/L (ref 0–35)
AST: 20 U/L (ref 0–37)
Albumin: 4.3 g/dL (ref 3.5–5.2)
Alkaline Phosphatase: 62 U/L (ref 39–117)
Bilirubin, Direct: 0 mg/dL (ref 0.0–0.3)
Total Protein: 6.6 g/dL (ref 6.0–8.3)

## 2010-08-14 MED ORDER — CYANOCOBALAMIN 1000 MCG/ML IJ SOLN
1000.0000 ug | Freq: Once | INTRAMUSCULAR | Status: AC
  Administered 2010-08-14: 1000 ug via INTRAMUSCULAR

## 2010-08-14 NOTE — Progress Notes (Signed)
  Subjective:    Patient ID: [name], female    DOB: 1922/07/08, 77 y.o.   MRN: [id number]  HPI  77 year old patient who is seen today for followup. She has coronary artery disease and is scheduled for cardiac evaluation tomorrow. She has a history of hypertension and pernicious anemia. She continues to receive monthly B12 injections. Complaints today include fatigue as well as left chest wall pain. She continues to have less frequent right upper quadrant pain. Denies any chest pain that sounds ischemic although she did use a nitroglycerin tablet one week ago.    Wt Readings from Last 3 Encounters:  08/14/10 120 lb (54.432 kg)  07/31/10 121 lb (54.885 kg)  04/19/10 120 lb (54.432 kg)      Review of Systems  Constitutional: Positive for fatigue.  HENT: Negative for hearing loss, congestion, sore throat, rhinorrhea, dental problem, sinus pressure and tinnitus.   Eyes: Negative for pain, discharge and visual disturbance.  Respiratory: Negative for cough and shortness of breath.   Cardiovascular: Positive for chest pain. Negative for palpitations and leg swelling.  Gastrointestinal: Negative for nausea, vomiting, abdominal pain, diarrhea, constipation, blood in stool and abdominal distention.  Genitourinary: Negative for dysuria, urgency, frequency, hematuria, flank pain, vaginal bleeding, vaginal discharge, difficulty urinating, vaginal pain and pelvic pain.  Musculoskeletal: Negative for joint swelling, arthralgias and gait problem.  Skin: Negative for rash.  Neurological: Positive for weakness. Negative for dizziness, syncope, speech difficulty, numbness and headaches.  Hematological: Negative for adenopathy.  Psychiatric/Behavioral: Negative for behavioral problems, dysphoric mood and agitation. The patient is not nervous/anxious.        Objective:   Physical Exam  Constitutional: She is oriented to person, place, and time. She appears well-developed and well-nourished. No  distress.       Blood pressure 104/60 O2 saturation 97 with pulse rate 57  HENT:  Head: Normocephalic.  Right Ear: External ear normal.  Left Ear: External ear normal.  Mouth/Throat: Oropharynx is clear and moist.  Eyes: Conjunctivae and EOM are normal. Pupils are equal, round, and reactive to light.  Neck: Normal range of motion. Neck supple. No thyromegaly present.  Cardiovascular: Normal rate, regular rhythm, normal heart sounds and intact distal pulses.   Pulmonary/Chest: Effort normal and breath sounds normal. No respiratory distress. She has no wheezes. She has no rales.       Mild tenderness along the left lateral chest wall  Abdominal: Soft. Bowel sounds are normal. She exhibits no mass. There is no tenderness.  Musculoskeletal: Normal range of motion.  Lymphadenopathy:    She has no cervical adenopathy.  Neurological: She is alert and oriented to person, place, and time.  Skin: Skin is warm and dry. No rash noted.  Psychiatric: She has a normal mood and affect. Her behavior is normal.          Assessment & Plan:      Coronary artery disease B12 deficiency Chest wall pain Fatigue/anxiety disorder  We'll update lab including a sedimentation rate. Cardiology followup tomorrow

## 2010-08-14 NOTE — Patient Instructions (Signed)
Cardiology followup as scheduled  Return in 4 months for follow-up

## 2010-08-15 ENCOUNTER — Ambulatory Visit (INDEPENDENT_AMBULATORY_CARE_PROVIDER_SITE_OTHER)
Admission: RE | Admit: 2010-08-15 | Discharge: 2010-08-15 | Disposition: A | Discharge status: Home / Self Care | Payer: Medicare Other | Source: Ambulatory Visit | Attending: Pulmonary Disease | Admitting: Pulmonary Disease

## 2010-08-15 DIAGNOSIS — J984 Other disorders of lung: Secondary | ICD-10-CM

## 2010-08-15 DIAGNOSIS — R911 Solitary pulmonary nodule: Secondary | ICD-10-CM

## 2010-08-17 ENCOUNTER — Other Ambulatory Visit: Payer: Self-pay | Admitting: Physical Medicine & Rehabilitation

## 2010-08-17 DIAGNOSIS — R52 Pain, unspecified: Secondary | ICD-10-CM

## 2010-08-21 ENCOUNTER — Ambulatory Visit (HOSPITAL_COMMUNITY)
Admission: RE | Admit: 2010-08-21 | Discharge: 2010-08-21 | Disposition: A | Discharge status: Home / Self Care | Payer: Medicare Other | Source: Ambulatory Visit | Attending: Physical Medicine & Rehabilitation | Admitting: Physical Medicine & Rehabilitation

## 2010-08-21 ENCOUNTER — Ambulatory Visit: Payer: BC Managed Care – PPO | Admitting: Internal Medicine

## 2010-08-21 DIAGNOSIS — R5381 Other malaise: Secondary | ICD-10-CM | POA: Insufficient documentation

## 2010-08-21 DIAGNOSIS — M542 Cervicalgia: Secondary | ICD-10-CM | POA: Insufficient documentation

## 2010-08-21 DIAGNOSIS — R52 Pain, unspecified: Secondary | ICD-10-CM

## 2010-08-21 DIAGNOSIS — M25519 Pain in unspecified shoulder: Secondary | ICD-10-CM | POA: Insufficient documentation

## 2010-08-21 DIAGNOSIS — M47812 Spondylosis without myelopathy or radiculopathy, cervical region: Secondary | ICD-10-CM | POA: Insufficient documentation

## 2010-08-28 ENCOUNTER — Ambulatory Visit: Payer: Medicare Other | Admitting: Physical Medicine & Rehabilitation

## 2010-08-28 ENCOUNTER — Encounter: Payer: Medicare Other | Attending: Physical Medicine & Rehabilitation

## 2010-08-28 DIAGNOSIS — M25819 Other specified joint disorders, unspecified shoulder: Secondary | ICD-10-CM | POA: Insufficient documentation

## 2010-08-28 DIAGNOSIS — M719 Bursopathy, unspecified: Secondary | ICD-10-CM

## 2010-08-28 DIAGNOSIS — M47812 Spondylosis without myelopathy or radiculopathy, cervical region: Secondary | ICD-10-CM

## 2010-08-28 DIAGNOSIS — IMO0001 Reserved for inherently not codable concepts without codable children: Secondary | ICD-10-CM | POA: Insufficient documentation

## 2010-08-28 DIAGNOSIS — M25519 Pain in unspecified shoulder: Secondary | ICD-10-CM | POA: Insufficient documentation

## 2010-08-28 DIAGNOSIS — M67919 Unspecified disorder of synovium and tendon, unspecified shoulder: Secondary | ICD-10-CM

## 2010-08-28 NOTE — Assessment & Plan Note (Signed)
REASON FOR VISIT:  Neck pain and left shoulder pain.  HISTORY:  An 77 year old female who has had left upper quadrant pain in left shoulder, trapezius, levator scapula, initially responding well to trigger-point injections as well as physical therapy.  She returned on July 20, 2010, stating that her pain has recurred and her other measures have not been helpful.  She indicates her pain is 6-7/10.  It is around the shoulder blade on the left wrapping around under her ribs and under the left breast, 6/10, does not impede activity, does not really come and go.  She can walk 5 minutes at a time.  She climbs steps.  She can drive.  She is independent with all her self-care.  She complains of some weakness and dizziness mainly in the morning.  She is widowed, lives with her daughter.  No drinking.  No smoking.  PHYSICAL EXAMINATION:  VITAL SIGNS:  Blood pressure is 115/52, pulse 64, respirations 16, and O2 saturation 97% on room air. GENERAL:  This is a frail, elderly appearing female in no acute distress. NEUROLOGIC:  Affect is alert.  Gait is normal.  She has positive impingement sign in left shoulder.  She has negative Spurling's.  She has normal strength in bilateral upper extremities.  She has no pain around the scapular area or upper trap area.  IMPRESSION:  Reviewed MRI of the cervical spine with her using spine model.  She has mild facet degeneration at C2-3 and C3-4 left greater than right, C4-5 left greater than right.  Bilateral foraminal encroachment at C5-6 and really unremarkable at C6-7 and C7-T1.  Overall, it appears that she has a combination of left cervical facet syndrome as well as left impingement syndrome.  We discussed treatment options including cervical injections as well as shoulder injection, really does not have any desire to do this.  I will see her back on a p.r.n. basis.  She really does not want any medications and has already tried physical therapy.   Discussed with the patient, agrees with plan.     Erick Colace, M.D. Electronically Signed    AEK/MedQ D:  08/28/2010 13:59:57  T:  08/28/2010 23:48:39  Job #:  [id number]  cc:   Gordy Savers, MD [address] [place] [address]

## 2010-09-07 ENCOUNTER — Ambulatory Visit (INDEPENDENT_AMBULATORY_CARE_PROVIDER_SITE_OTHER): Payer: Medicare Other | Admitting: Internal Medicine

## 2010-09-07 DIAGNOSIS — D510 Vitamin B12 deficiency anemia due to intrinsic factor deficiency: Secondary | ICD-10-CM

## 2010-09-07 MED ORDER — CYANOCOBALAMIN 1000 MCG/ML IJ SOLN
1000.0000 ug | Freq: Once | INTRAMUSCULAR | Status: AC
  Administered 2010-09-07: 1000 ug via INTRAMUSCULAR

## 2010-09-15 ENCOUNTER — Inpatient Hospital Stay (HOSPITAL_COMMUNITY)
Admission: EM | Admit: 2010-09-15 | Discharge: 2010-09-17 | DRG: 065 | Disposition: A | Discharge status: Home / Self Care | Payer: Medicare Other | Attending: Internal Medicine | Admitting: Internal Medicine

## 2010-09-15 ENCOUNTER — Emergency Department (HOSPITAL_COMMUNITY): Payer: Medicare Other

## 2010-09-15 DIAGNOSIS — Z7902 Long term (current) use of antithrombotics/antiplatelets: Secondary | ICD-10-CM

## 2010-09-15 DIAGNOSIS — Z8673 Personal history of transient ischemic attack (TIA), and cerebral infarction without residual deficits: Secondary | ICD-10-CM

## 2010-09-15 DIAGNOSIS — I6350 Cerebral infarction due to unspecified occlusion or stenosis of unspecified cerebral artery: Principal | ICD-10-CM | POA: Diagnosis present

## 2010-09-15 DIAGNOSIS — Z882 Allergy status to sulfonamides status: Secondary | ICD-10-CM

## 2010-09-15 DIAGNOSIS — I2510 Atherosclerotic heart disease of native coronary artery without angina pectoris: Secondary | ICD-10-CM | POA: Diagnosis present

## 2010-09-15 DIAGNOSIS — I10 Essential (primary) hypertension: Secondary | ICD-10-CM | POA: Diagnosis present

## 2010-09-15 DIAGNOSIS — R82998 Other abnormal findings in urine: Secondary | ICD-10-CM | POA: Diagnosis present

## 2010-09-15 DIAGNOSIS — N390 Urinary tract infection, site not specified: Secondary | ICD-10-CM | POA: Diagnosis present

## 2010-09-15 DIAGNOSIS — I252 Old myocardial infarction: Secondary | ICD-10-CM

## 2010-09-15 LAB — COMPREHENSIVE METABOLIC PANEL
AST: 14 U/L (ref 0–37)
Albumin: 3.3 g/dL — ABNORMAL LOW (ref 3.5–5.2)
Alkaline Phosphatase: 78 U/L (ref 39–117)
BUN: 20 mg/dL (ref 6–23)
Chloride: 98 mEq/L (ref 96–112)
Potassium: 4.6 mEq/L (ref 3.5–5.1)
Total Bilirubin: 0.2 mg/dL — ABNORMAL LOW (ref 0.3–1.2)
Total Protein: 6.1 g/dL (ref 6.0–8.3)

## 2010-09-15 LAB — URINALYSIS, ROUTINE W REFLEX MICROSCOPIC
Glucose, UA: NEGATIVE mg/dL
Hgb urine dipstick: NEGATIVE
Ketones, ur: NEGATIVE mg/dL
Protein, ur: NEGATIVE mg/dL
pH: 6 (ref 5.0–8.0)

## 2010-09-15 LAB — CBC
HCT: 33.3 % — ABNORMAL LOW (ref 36.0–46.0)
Hemoglobin: 11.3 g/dL — ABNORMAL LOW (ref 12.0–15.0)
MCV: 89.5 fL (ref 78.0–100.0)
RBC: 3.72 MIL/uL — ABNORMAL LOW (ref 3.87–5.11)
WBC: 8.3 10*3/uL (ref 4.0–10.5)

## 2010-09-15 LAB — DIFFERENTIAL
Basophils Absolute: 0 10*3/uL (ref 0.0–0.1)
Basophils Relative: 0 % (ref 0–1)
Eosinophils Relative: 3 % (ref 0–5)
Monocytes Absolute: 1.1 10*3/uL — ABNORMAL HIGH (ref 0.1–1.0)

## 2010-09-15 LAB — LIPID PANEL
Cholesterol: 197 mg/dL (ref 0–200)
Triglycerides: 96 mg/dL (ref <150)
VLDL: 19 mg/dL (ref 0–40)

## 2010-09-15 LAB — CK TOTAL AND CKMB (NOT AT ARMC)
CK, MB: 2.3 ng/mL (ref 0.3–4.0)
Relative Index: INVALID (ref 0.0–2.5)

## 2010-09-15 LAB — TROPONIN I: Troponin I: <0.3 ng/mL (ref <0.30)

## 2010-09-15 LAB — URINE MICROSCOPIC-ADD ON

## 2010-09-16 ENCOUNTER — Inpatient Hospital Stay (HOSPITAL_COMMUNITY): Payer: Medicare Other

## 2010-09-16 LAB — HEMOGLOBIN A1C: Mean Plasma Glucose: 134 mg/dL — ABNORMAL HIGH (ref <117)

## 2010-09-16 LAB — BASIC METABOLIC PANEL
BUN: 21 mg/dL (ref 6–23)
CO2: 29 mEq/L (ref 19–32)
Chloride: 102 mEq/L (ref 96–112)
Creatinine, Ser: 0.94 mg/dL (ref 0.50–1.10)
Glucose, Bld: 92 mg/dL (ref 70–99)
Potassium: 4.2 mEq/L (ref 3.5–5.1)

## 2010-09-16 MED ORDER — GADOBENATE DIMEGLUMINE 529 MG/ML IV SOLN
10.0000 mL | Freq: Once | INTRAVENOUS | Status: AC
  Administered 2010-09-16: 10 mL via INTRAVENOUS
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
## 2010-09-17 DIAGNOSIS — G459 Transient cerebral ischemic attack, unspecified: Secondary | ICD-10-CM

## 2010-09-17 LAB — URINE CULTURE
Colony Count: >=100000
Culture  Setup Time: 201208121819
Special Requests: NEGATIVE

## 2010-09-17 LAB — BASIC METABOLIC PANEL
CO2: 29 mEq/L (ref 19–32)
Calcium: 9.3 mg/dL (ref 8.4–10.5)
Chloride: 103 mEq/L (ref 96–112)
Glucose, Bld: 100 mg/dL — ABNORMAL HIGH (ref 70–99)
Potassium: 4.6 mEq/L (ref 3.5–5.1)
Sodium: 138 mEq/L (ref 135–145)

## 2010-09-18 NOTE — Discharge Summary (Signed)
NAMEMERLYN, BOLLEN NO.:  [id number]  MEDICAL RECORD NO.:  [id number]  LOCATION:  3021                         FACILITY:  [facility]  PHYSICIAN:  Altha Harm, MDDATE OF BIRTH:  12-24-1922  DATE OF ADMISSION:  09/15/2010 DATE OF DISCHARGE:  09/17/2010                              DISCHARGE SUMMARY   DISCHARGE DISPOSITION:  Home.  FINAL DISCHARGE DIAGNOSES: 1. Acute right rolandic cortical infarct. 2. Vertigo, resolved. 3. Pyuria.  Urine cultures negative. 4. Coronary artery disease, amyocardial infarction x2 in the past. 5. History of hypertension. 6. Abnormal lipids.  DISCHARGE MEDICATIONS: 1. Meclizine 25 mg p.o. q. 8 hours. 2. Plavix 75 mg p.o. daily. 3. Crestor 5 mg p.o. daily. 4. Carvedilol 3.125 mg p.o. b.i.d. 5. Nitroglycerin sublingual 0.4 mg q. 5 minutes up to three doses as     needed for chest pain. 6. Prednisone tapering dose pack. 7. Vitamin B12 one injection intramuscularly monthly.  DISCONTINUED MEDICATIONS:  Dipyridamole 75 mg b.i.d. and aspirin 325 mg p.o. daily.  CONSULTANTS:  Dr. Delia Heady, Neurology.  PROCEDURES:  None.  DIAGNOSTIC STUDIES: 1. CT of the head without contrast which shows negative for bleed or     other acute intracranial process.  Impression:     a.     Atrophy and nonspecific white matter changes.     b.     Stable postoperative changes. 2. MR of the brain without contrast which shows punctate diffuse right     peri-Rolandi cortical infarct.  No mass effect or hemorrhage.     Impression:     a.     Otherwise stable chronic findings in the brain from a small      vessel ischemia and prior right occipital meningioma resection. 3. MRA of the head and neck which shows mild to age atherosclerosis.     Tortuous distal cervical ICA.  No arterial stenosis in the neck.     Impression:     a.     Incidental left vertebral artery origin from the aortic      arch.  Intracranial MRA findings as noted below with  stable      intracranial MRA without intracranial stenosis or major vessel      occlusion.  Moderate vessel tortuosity mild for age intracranial      atherosclerosis. 4. Carotid duplex which shows no stenosis. 5. A 2-D echocardiogram which shows grade 1 diastolic dysfunction.     The left ventricle showed a normal cavity size, there is mild focal     basal hypertrophy of the septum.  Systolic function is normal with     a range of ejection fraction of 60-65%.  PRIMARY CARE PHYSICIAN:  Gordy Savers, MD  CODE STATUS:  Full code.  ALLERGIES: 1. SULFA. 2. DILANTIN 3. PHENERGAN. 4. LIPITOR.  CHIEF COMPLAINT:  Dizziness.  HISTORY OF PRESENT ILLNESS:  Please refer to the H and P dictated on September 15, 2010, for details of the HPI.  However, in short, Ms. Huseman is a 77 year old patient who went to bed with her usual state of health and woke up in the morning with dizziness that she has never felt before.  The patient was brought to the emergency room for further evaluation and management.  Here in the emergency room, the patient was found to have a small CVA on MRI.  The [facility] were asked to see the patient for further evaluation and management.  HOSPITAL COURSE: 1. Punctate right CVA in the peri-Rolandic region.  The patient's     dizziness was probably secondary to the CVA.  Her CVA was rather     small.  The patient underwent risk factor stratification and there     were no reversible causes found.  The patient had been on Aggrenox     components at home in the form of Dipyridamole 75 mg b.i.d. and     aspirin 325 mg daily.  Studies have shown and have been     corroborated with the neurologist who says there is no benefit to     giving the component of Aggrenox separately.  Thus, the patient has     been switched over to Plavix 75 mg daily.  If the patient cannot     afford Plavix, then it would be reasonable to just put her on a     plain aspirin 325 mg  p.o. daily without any addition of     Dipyridamole. 2. Vertigo.  The patient's vertigo I suspect is secondary to the     stroke at least the time that the stroke was occurring.  The     patient was started on meclizine and her vertigo has resolved     completely. 3. Nausea and vomiting.  Once the vertigo subsided, the patient's     nausea and vomiting also subsided and she has been able to tolerate     diet without any difficulty.  The patient has been evaluated by     Physical Therapy and Occupational Therapy, at this time they     identified no further rehab needs.  At the time of discharge, the     patient is stable.  She is symptom free at this time.  Her     daughter, Trixie Rude is standing at the bedside next to her.  PHYSICAL EXAMINATION:  VITAL SIGNS:  Temperature is 97.3, heart rate 64, blood pressure 130/88, respiratory rate 16, O2 sats are 99% on room air. HEENT:  She is normocephalic, atraumatic.  Pupils equally round and reactive to light and accommodation.  Extraocular movements are intact. Oropharynx is moist.  No exudate, erythema or lesions are noted. NECK:  Trachea is midline.  No masses, no thyromegaly, no JVD, no carotid bruit. RESPIRATORY:  She has a normal respiratory effort, equal excursion bilaterally.  No wheezing, no rhonchi are noted. CARDIOVASCULAR:  She has got normal S1-S2.  No murmurs, rubs or gallops are noted.  PMI is nondisplaced.  No heaves or thrills on palpation. ABDOMEN:  Obese, soft, nontender, nondistended.  No masses, no hepatosplenomegaly noted. EXTREMITIES:  No clubbing, cyanosis or edema. NEUROLOGICAL:  She has no focal neurological deficits.  Cranial nerves II-XII are grossly intact. PSYCHIATRIC:  She is alert and oriented x3.  Good insight and cognition, good recent and remote recall.  DIETARY RESTRICTIONS:  The patient should be on a heart-healthy diet.  PHYSICAL RESTRICTIONS:  None.  FOLLOWUP ISSUES:  Follow up with her primary  care physician, Dr. Amador Cunas within a week.  Please note, the patient had been on Dipyridamole and aspirin.  The patient has been changed over to Plavix.  If the patient cannot afford Plavix,  then the alternative would be aspirin 325 mg p.o. daily without the Dipyridamole component as the separate components have not been shown to have any benefit over straight aspirin and itself.  Total time for this discharge process including face-to-face time 37 minutes.     Altha Harm, MD     MAM/MEDQ  D:  09/17/2010  T:  09/17/2010  Job:  [id number]  cc:   Pramod P. Pearlean Brownie, MD Gordy Savers, MD  Electronically Signed by Marthann Schiller MD on 09/18/2010 08:31:08 PM

## 2010-09-18 NOTE — H&P (Signed)
[name], [name] NO.:  [id number]  MEDICAL RECORD NO.:  [id number]  LOCATION:  [facility]                         FACILITY:  [facility]  PHYSICIAN:  Altha Harm, MDDATE OF BIRTH:  15-Jun-1922  DATE OF ADMISSION:  09/15/2010 DATE OF DISCHARGE:                             HISTORY & PHYSICAL   PRIMARY CARE PHYSICIAN:  Gordy Savers, MD  CHIEF COMPLAINT:  Dizziness, since 4 a.m. this morning.  HISTORY OF PRESENT ILLNESS:  [name] is an 77 year old, very frail- appearing female.  The patient states that she went to bed last night in her usual state of health and this morning upon waking at 4 o'clock to go to the bathroom, she started having some dizziness.  The patient is unable to give a good description of the dizziness.  In terms of whether she was having spinning of the head or whether she felt that she was spending; however, she states that she was able to stand and walk to the bathroom.  However, when she got to the bathroom and urinated, after that she was really unable to get back to bed and had to call her daughter for help.  The patient states that along with the dizziness, she also had significant nausea.  This continued that she got here to the emergency room where she was given a dose of meclizine.  The patient states that at this time, the dizziness has subsided considerably; however, it has not completely resolved and the nausea has completely resolved.  On MRI examination in the emergency room, the patient was found to have a small area of what appears to be acute infarct around the rolandic seizure.  [facility].  The patient denies any fevers or chills.  She denies any syncope.  She denies any seizure activity.  She denies any chest pain.  She denies any frequency or urgency.  She denies any constipation or diarrhea.  PAST MEDICAL HISTORY:  Significant for the  following; 1. Multiple CVAs. 2. MI x2 in the past. 3. Benign meningioma that was removed. 4. Hypertension. 5. Abnormal lipids.  FAMILY HISTORY:  Significant for diabetes and coronary artery disease in her brother.  SOCIAL HISTORY:  Her daughter [name] resides with her and she is her next of kin.  She can be reached at area code [phone] on her cell or at home at area code [phone].  She denies any tobacco, alcohol, or drug.  MEDICATIONS:  Have been reviewed.  ALLERGIES: 1. DILANTIN. 2. SULFA. 3. LIPITOR. 4. PHENERGAN. 5. ELECTRICAL THERAPY.  REVIEW OF SYSTEMS:  The only other complaints is the patient has ongoing pain in the muscle belly of the latissimus dorsi and around the left shoulder.  The patient has seen Dr. Wynn Banker on ongoing basis for treatment of this.  IMAGING STUDIES:  Studies in the emergency room; 1. CT scan of the head without contrast shows negative for bleed or     other acute intracranial process. Impression; a.  Atrophy and nonspecific white matter changes. b.  Stable postoperative changes. 1. MRI of the brain without contrast, which shows a punctate area  of     the right perirolandic cortical infarct.  No mass effect or     hemorrhage.  Otherwise, stable.  Chronic findings in the brain from     a small-vessel ischemia and parieto-occipital meningioma resection.  LABORATORY DATA:  Laboratory studies in the emergency room, which shows urinalysis, which shows findings consistent with a urinary tract infection.  Hemogram shows a white blood cell count of 8.3, hemoglobin of 11.3, hematocrit of 33.3, and platelet count of 216.  Sodium of 132, potassium 4.6, chloride 98, bicarb 30, BUN 20, and creatinine 0.87. Cardiac enzymes were negative.  PHYSICAL EXAMINATION:  GENERAL:  A frail-appearing female in no acute distress. VITAL SIGNS:  Temperature is 97.5, heart rate 56, blood pressure 131/63, respiratory rate 16, O2 sats are 100% on  room air. HEENT:  She is normocephalic and atraumatic.  Pupils are equally round and reactive to light and accommodation.  Extraocular movements were intact.  Oropharynx is moist.  No active erythema or lesions are noted. NECK:  Trachea is midline.  No masses.  No thyromegaly.  No JVD.  No carotid bruit. RESPIRATORY:  The patient has a kyphotic posture with a normal respiratory effort and equal excursion bilaterally.  No wheezing or rhonchi noted. CARDIOVASCULAR:  She has got a normal S1 and S2.  No murmurs, rubs, or gallops were noted.  PMI is nondisplaced.  No heaves or thrills on palpation. ABDOMEN:  Obese, soft, nontender, and nondistended.  No masses.  No hepatosplenomegaly is noted. MUSCULOSKELETAL:  The patient has tenderness in the belly of latissimus dorsi at the process of the posterior shoulder joint.  She does have arthritic changes in the joints of the upper and lower extremities. However, there is no warm swelling or erythema around the joint.  During skeletal survey, she has got no cervical, axillary, or inguinal lymphadenopathy noted. PSYCHIATRIC:  She is alert and oriented x3.  Good insight and cognition. Good recent and remote recall. NEUROLOGIC:  She appears to have no focal neurological deficits. Cranial nerves II through XII were grossly intact.  DTRs 2+ bilaterally in the upper and lower extremity.  ASSESSMENT AND PLAN:  The patient who presents with; 1. An acute cerebrovascular accident. 2. Vertigo. 3. Urinary tract infection.  The plan, the patient will be given risk     factor stratification and reduction regarding her acute     cerebrovascular accident.  At this time because of the patient's     significant allergies to statins, I will not start her on a statin,     but we will refrain from that.  We will go ahead and get a 2-D     echocardiogram, carotid duplex, and check the patient's cholesterol     as well as her hemoglobin A1c.  In terms of the  vertigo, this may     be a presentation of benign positional vertigo, which already seems     to have improved considerably with meclizine.  We will continue the     meclizine and observe the patient's progress and make decisions     based on her clinical course and testing. 4. Urinary tract infection.  We will go ahead and put the patient on     her Rocephin until we get cultures back and then narrow the     suspension of the antibiotics as appropriate.  In terms of her home     medications, dose will be reviewed and restarted as appropriate.  The patient is being admitted to [facility] team 7 to a telemetry     bed.     Altha Harm, MD     MAM/MEDQ  D:  09/15/2010  T:  09/15/2010  Job:  [id number]  cc:   Gordy Savers, MD  Electronically Signed by Marthann Schiller MD on 09/18/2010 08:30:43 PM

## 2010-09-25 ENCOUNTER — Ambulatory Visit (INDEPENDENT_AMBULATORY_CARE_PROVIDER_SITE_OTHER): Payer: Medicare Other | Admitting: Internal Medicine

## 2010-09-25 ENCOUNTER — Encounter: Payer: Self-pay | Admitting: Internal Medicine

## 2010-09-25 DIAGNOSIS — I10 Essential (primary) hypertension: Secondary | ICD-10-CM

## 2010-09-25 DIAGNOSIS — I2510 Atherosclerotic heart disease of native coronary artery without angina pectoris: Secondary | ICD-10-CM

## 2010-09-25 DIAGNOSIS — I69959 Hemiplegia and hemiparesis following unspecified cerebrovascular disease affecting unspecified side: Secondary | ICD-10-CM

## 2010-09-25 DIAGNOSIS — D510 Vitamin B12 deficiency anemia due to intrinsic factor deficiency: Secondary | ICD-10-CM

## 2010-09-25 MED ORDER — PRAVASTATIN SODIUM 20 MG PO TABS: 20.0000 mg | ORAL_TABLET | Freq: Every evening | ORAL | Status: DC

## 2010-09-25 MED ORDER — CLOPIDOGREL BISULFATE 75 MG PO TABS: 75.0000 mg | ORAL_TABLET | Freq: Every day | ORAL | Status: DC

## 2010-09-25 NOTE — Progress Notes (Signed)
  Subjective:    Patient ID: [name], female    DOB: 11-15-1922, 77 y.o.   MRN: [id number]  HPI  is an 77 year old patient who is seen today following a hospital discharge. She presented with acute vertigo evaluation revealed an acute right peri-rolandi infarct. Her vertigo has resolved. She was discharged on Plavix and aspirin and Persantine discontinued. She was also placed on low-dose Crestor 5 mg daily. She objects to the high cost. Denies any focal neurological complaints. She does have a history of B12 deficiency. She has treated hypertension coronary artery disease. Hospital records reviewed  Review of Systems  Constitutional: Negative.   HENT: Negative for hearing loss, congestion, sore throat, rhinorrhea, dental problem, sinus pressure and tinnitus.   Eyes: Negative for pain, discharge and visual disturbance.  Respiratory: Negative for cough and shortness of breath.   Cardiovascular: Negative for chest pain, palpitations and leg swelling.  Gastrointestinal: Negative for nausea, vomiting, abdominal pain, diarrhea, constipation, blood in stool and abdominal distention.  Genitourinary: Negative for dysuria, urgency, frequency, hematuria, flank pain, vaginal bleeding, vaginal discharge, difficulty urinating, vaginal pain and pelvic pain.  Musculoskeletal: Negative for joint swelling, arthralgias and gait problem.  Skin: Negative for rash.  Neurological: Negative for dizziness, syncope, speech difficulty, weakness, numbness and headaches.  Hematological: Negative for adenopathy.  Psychiatric/Behavioral: Negative for behavioral problems, dysphoric mood and agitation. The patient is not nervous/anxious.        Objective:   Physical Exam  Constitutional: She is oriented to person, place, and time. She appears well-developed and well-nourished.  HENT:  Head: Normocephalic.  Right Ear: External ear normal.  Left Ear: External ear normal.  Mouth/Throat: Oropharynx is clear and moist.    Eyes: Conjunctivae and EOM are normal. Pupils are equal, round, and reactive to light.  Neck: Normal range of motion. Neck supple. No thyromegaly present.  Cardiovascular: Normal rate, regular rhythm, normal heart sounds and intact distal pulses.   Pulmonary/Chest: Effort normal and breath sounds normal.  Abdominal: Soft. Bowel sounds are normal. She exhibits no mass. There is no tenderness.  Musculoskeletal: Normal range of motion.  Lymphadenopathy:    She has no cervical adenopathy.  Neurological: She is alert and oriented to person, place, and time. She has normal reflexes. No cranial nerve deficit. Coordination normal.       Finger to nose testing normal  Skin: Skin is warm and dry. No rash noted.  Psychiatric: She has a normal mood and affect. Her behavior is normal.          Assessment & Plan:   Status post CVA. Will continue Plavix therapy Dyslipidemia. Due to cost considerations will discontinue Crestor and placed on pravastatin B12 deficiency Coronary artery disease

## 2010-09-25 NOTE — Patient Instructions (Signed)
Limit your sodium (Salt) intake    It is important that you exercise regularly, at least 20 minutes 3 to 4 times per week.  If you develop chest pain or shortness of breath seek  medical attention.  Return in 3 months for follow-up  

## 2010-10-09 ENCOUNTER — Ambulatory Visit: Payer: Medicare Other | Admitting: Internal Medicine

## 2010-10-31 LAB — CARDIAC PANEL(CRET KIN+CKTOT+MB+TROPI)
CK, MB: 5.5 — ABNORMAL HIGH
CK, MB: 1.7
CK, MB: 2.2
CK, MB: 3.5
Relative Index: 5.1 — ABNORMAL HIGH
Relative Index: INVALID
Relative Index: INVALID
Troponin I: 0.7
Troponin I: 0.14 — ABNORMAL HIGH
Troponin I: 0.17 — ABNORMAL HIGH
Troponin I: 0.31 — ABNORMAL HIGH

## 2010-10-31 LAB — CBC
HCT: 32.9 — ABNORMAL LOW
HCT: 27.3 — ABNORMAL LOW
HCT: 27.6 — ABNORMAL LOW
HCT: 27.9 — ABNORMAL LOW
Hemoglobin: 10.7 — ABNORMAL LOW
Hemoglobin: 9.1 — ABNORMAL LOW
Hemoglobin: 9.1 — ABNORMAL LOW
MCHC: 33.9
MCHC: 32.6
MCHC: 32.7
MCHC: 33
MCV: 84.2
MCV: 83.6
MCV: 84.1
MCV: 84.6
Platelets: 215
Platelets: 220
Platelets: 162
Platelets: 167
RBC: 3.91
RBC: 3.26 — ABNORMAL LOW
RBC: 3.28 — ABNORMAL LOW
RBC: 3.3 — ABNORMAL LOW
RDW: 15.7 — ABNORMAL HIGH
WBC: 10.5
WBC: 6.2
WBC: 6.5

## 2010-10-31 LAB — APTT: aPTT: 31

## 2010-10-31 LAB — URINE CULTURE
Colony Count: >=100000
Special Requests: NEGATIVE

## 2010-10-31 LAB — COMPREHENSIVE METABOLIC PANEL
ALT: 12
AST: 21
AST: 23
Albumin: 3.5
BUN: 13
Calcium: 8.9
Chloride: 106
Creatinine, Ser: 0.86
Creatinine, Ser: 0.9
GFR calc Af Amer: >60
GFR calc Af Amer: >60
Sodium: 137
Total Bilirubin: 0.9
Total Protein: 5.8 — ABNORMAL LOW
Total Protein: 6

## 2010-10-31 LAB — DIFFERENTIAL
Eosinophils Absolute: 0.5
Eosinophils Relative: 6 — ABNORMAL HIGH
Lymphocytes Relative: 21
Lymphs Abs: 1.6
Monocytes Relative: 11

## 2010-10-31 LAB — BASIC METABOLIC PANEL
BUN: 7
CO2: 26
Calcium: 8.5
Chloride: 111
Creatinine, Ser: 0.8

## 2010-10-31 LAB — URINALYSIS, ROUTINE W REFLEX MICROSCOPIC
Bilirubin Urine: NEGATIVE
Glucose, UA: NEGATIVE
Specific Gravity, Urine: 1.01
pH: 5.5

## 2010-10-31 LAB — PROTIME-INR
INR: 0.9
INR: 1
Prothrombin Time: 12
Prothrombin Time: 13.5

## 2010-10-31 LAB — CK TOTAL AND CKMB (NOT AT ARMC): CK, MB: 2.3

## 2010-10-31 LAB — LIPID PANEL
Cholesterol: 159
LDL Cholesterol: 107 — ABNORMAL HIGH

## 2010-10-31 LAB — TROPONIN I: Troponin I: 0.04

## 2010-10-31 LAB — URINE MICROSCOPIC-ADD ON

## 2010-10-31 LAB — HEMOGLOBIN A1C: Mean Plasma Glucose: 129

## 2010-10-31 LAB — B-NATRIURETIC PEPTIDE (CONVERTED LAB): Pro B Natriuretic peptide (BNP): 812 — ABNORMAL HIGH

## 2010-11-02 LAB — CBC
Platelets: 235
RBC: 3.88
WBC: 6.9

## 2010-11-02 LAB — POCT I-STAT, CHEM 8
Chloride: 109
Creatinine, Ser: 1
Glucose, Bld: 117 — ABNORMAL HIGH
HCT: 34 — ABNORMAL LOW
Potassium: 4.9
Sodium: 136

## 2010-11-02 LAB — TROPONIN I: Troponin I: <0.01

## 2010-11-02 LAB — POCT CARDIAC MARKERS
CKMB, poc: 1.2
Myoglobin, poc: 98.2
Troponin i, poc: <0.05

## 2010-11-02 LAB — DIFFERENTIAL
Lymphs Abs: 1.4
Monocytes Relative: 10
Neutro Abs: 4.6
Neutrophils Relative %: 67

## 2010-11-02 LAB — CK TOTAL AND CKMB (NOT AT ARMC)
Relative Index: INVALID
Total CK: 93

## 2010-11-02 LAB — CARDIAC PANEL(CRET KIN+CKTOT+MB+TROPI)
Relative Index: INVALID
Troponin I: 0.02

## 2010-11-02 LAB — APTT: aPTT: 30

## 2010-11-02 LAB — PROTIME-INR: Prothrombin Time: 14.1

## 2010-11-06 LAB — CK TOTAL AND CKMB (NOT AT ARMC)
Relative Index: INVALID
Total CK: 83

## 2010-11-06 LAB — BASIC METABOLIC PANEL
BUN: 13
BUN: 15
CO2: 25
CO2: 27
CO2: 27
Calcium: 8.7
Chloride: 107
Chloride: 110
Creatinine, Ser: 0.76
Creatinine, Ser: 0.88
GFR calc Af Amer: >60
Glucose, Bld: 111 — ABNORMAL HIGH
Potassium: 3.8
Sodium: 141

## 2010-11-06 LAB — GLUCOSE, CAPILLARY: Glucose-Capillary: 97

## 2010-11-06 LAB — CBC
HCT: 30.3 — ABNORMAL LOW
HCT: 32.6 — ABNORMAL LOW
Hemoglobin: 10.7 — ABNORMAL LOW
MCHC: 32.6
MCV: 83.7
MCV: 83.9
MCV: 84.4
Platelets: 199
Platelets: 217
RBC: 3.61 — ABNORMAL LOW
RBC: 3.61 — ABNORMAL LOW
RBC: 3.86 — ABNORMAL LOW
RDW: 16 — ABNORMAL HIGH
WBC: 5.1
WBC: 5.6

## 2010-11-06 LAB — DIFFERENTIAL
Basophils Absolute: 0
Basophils Relative: 1
Eosinophils Absolute: 0.2
Eosinophils Relative: 4
Monocytes Absolute: 0.7
Monocytes Relative: 11
Neutro Abs: 4.2

## 2010-11-06 LAB — URINALYSIS, ROUTINE W REFLEX MICROSCOPIC
Glucose, UA: NEGATIVE
Hgb urine dipstick: NEGATIVE
Ketones, ur: NEGATIVE
Protein, ur: NEGATIVE
Urobilinogen, UA: 0.2

## 2010-11-06 LAB — CARDIAC PANEL(CRET KIN+CKTOT+MB+TROPI)
CK, MB: 1.2
Relative Index: INVALID
Relative Index: INVALID
Troponin I: 0.02
Troponin I: 0.02

## 2010-11-06 LAB — FOLATE: Folate: >20

## 2010-11-06 LAB — HEPATIC FUNCTION PANEL
AST: 21
Bilirubin, Direct: 0.2
Indirect Bilirubin: 0.4
Total Bilirubin: 0.6

## 2010-11-06 LAB — VITAMIN B12: Vitamin B-12: 1071 — ABNORMAL HIGH (ref 211–911)

## 2010-11-06 LAB — RAPID URINE DRUG SCREEN, HOSP PERFORMED
Amphetamines: NOT DETECTED
Tetrahydrocannabinol: NOT DETECTED

## 2010-11-06 LAB — LIPID PANEL
Cholesterol: 177
HDL: 38 — ABNORMAL LOW
Total CHOL/HDL Ratio: 4.7
Triglycerides: 77

## 2010-11-06 LAB — URINE MICROSCOPIC-ADD ON

## 2010-11-06 LAB — IRON AND TIBC: Saturation Ratios: 7 — ABNORMAL LOW

## 2010-11-06 LAB — PROTIME-INR: Prothrombin Time: 14

## 2010-11-06 LAB — RETICULOCYTES
RBC.: 3.74 — ABNORMAL LOW
Retic Ct Pct: 0.6

## 2010-11-06 LAB — D-DIMER, QUANTITATIVE: D-Dimer, Quant: 0.59 — ABNORMAL HIGH

## 2010-11-09 ENCOUNTER — Ambulatory Visit (INDEPENDENT_AMBULATORY_CARE_PROVIDER_SITE_OTHER): Payer: PRIVATE HEALTH INSURANCE | Admitting: Internal Medicine

## 2010-11-09 DIAGNOSIS — D510 Vitamin B12 deficiency anemia due to intrinsic factor deficiency: Secondary | ICD-10-CM

## 2010-11-09 MED ORDER — CYANOCOBALAMIN 1000 MCG/ML IJ SOLN
1000.0000 ug | Freq: Once | INTRAMUSCULAR | Status: AC
  Administered 2010-11-09: 1000 ug via INTRAMUSCULAR

## 2010-11-21 LAB — COMPREHENSIVE METABOLIC PANEL
ALT: 172 — ABNORMAL HIGH
ALT: 339 — ABNORMAL HIGH
AST: 71 — ABNORMAL HIGH
Albumin: 3.3 — ABNORMAL LOW
Alkaline Phosphatase: 458 — ABNORMAL HIGH
Alkaline Phosphatase: 527 — ABNORMAL HIGH
BUN: 14
CO2: 26
Chloride: 105
Chloride: 111
GFR calc Af Amer: >60
GFR calc non Af Amer: >60
Glucose, Bld: 109 — ABNORMAL HIGH
Glucose, Bld: 98
Potassium: 3.6
Potassium: 4
Sodium: 141
Total Bilirubin: 1.3 — ABNORMAL HIGH

## 2010-11-21 LAB — PROTIME-INR
INR: 1.3
Prothrombin Time: 16.2 — ABNORMAL HIGH

## 2010-11-21 LAB — URINALYSIS, ROUTINE W REFLEX MICROSCOPIC
Hgb urine dipstick: NEGATIVE
Specific Gravity, Urine: 1.017 (ref 1.005–1.035)
Urobilinogen, UA: 1

## 2010-11-21 LAB — CBC
HCT: 37.8
Hemoglobin: 10.8 — ABNORMAL LOW
Hemoglobin: 12.5
RBC: 3.6 — ABNORMAL LOW
WBC: 6.6
WBC: 7.1

## 2010-11-21 LAB — CULTURE, BLOOD (ROUTINE X 2): Culture: NO GROWTH

## 2010-11-21 LAB — URINE MICROSCOPIC-ADD ON

## 2010-11-21 LAB — HEPATITIS A ANTIBODY, IGM: Hep A IgM: NEGATIVE

## 2010-11-21 LAB — CANCER ANTIGEN 19-9: CA 19-9: 10.9 — ABNORMAL LOW (ref <35.0)

## 2010-11-21 LAB — URINE CULTURE: Culture: NO GROWTH

## 2010-11-21 LAB — HEPATITIS C ANTIBODY: HCV Ab: NEGATIVE

## 2010-12-18 ENCOUNTER — Ambulatory Visit (INDEPENDENT_AMBULATORY_CARE_PROVIDER_SITE_OTHER): Payer: Medicare Other | Admitting: Internal Medicine

## 2010-12-18 ENCOUNTER — Encounter: Payer: Self-pay | Admitting: Internal Medicine

## 2010-12-18 VITALS — BP 130/80 | Temp 97.6°F | Wt 125.0 lb

## 2010-12-18 DIAGNOSIS — I69959 Hemiplegia and hemiparesis following unspecified cerebrovascular disease affecting unspecified side: Secondary | ICD-10-CM

## 2010-12-18 DIAGNOSIS — D510 Vitamin B12 deficiency anemia due to intrinsic factor deficiency: Secondary | ICD-10-CM

## 2010-12-18 DIAGNOSIS — I10 Essential (primary) hypertension: Secondary | ICD-10-CM

## 2010-12-18 DIAGNOSIS — I2510 Atherosclerotic heart disease of native coronary artery without angina pectoris: Secondary | ICD-10-CM

## 2010-12-18 DIAGNOSIS — M1990 Unspecified osteoarthritis, unspecified site: Secondary | ICD-10-CM

## 2010-12-18 MED ORDER — CYANOCOBALAMIN 1000 MCG/ML IJ SOLN
1000.0000 ug | Freq: Once | INTRAMUSCULAR | Status: AC
  Administered 2010-12-18: 1000 ug via INTRAMUSCULAR

## 2010-12-18 NOTE — Patient Instructions (Signed)
Limit your sodium (Salt) intake  Return in 6 months for follow-up  

## 2010-12-18 NOTE — Progress Notes (Signed)
  Subjective:    Patient ID: [name], female    DOB: Dec 03, 1922, 77 y.o.   MRN: [id number]  HPI  77 year old patient who is seen today for followup. She has a history of treated hypertension coronary artery disease and cerebrovascular disease she has done fairly well. She does complain of weakness. No new neurological symptoms. She is accompanied by her daughter. She continues to have a left shoulder and left chest wall pain this is her predominant symptom today and has been quite chronic. She is on statin therapy due to her cerebrovascular disease    Review of Systems  HENT: Negative for hearing loss, congestion, sore throat, rhinorrhea, dental problem, sinus pressure and tinnitus.   Eyes: Negative for pain, discharge and visual disturbance.  Respiratory: Negative for cough and shortness of breath.   Cardiovascular: Negative for chest pain, palpitations and leg swelling.  Gastrointestinal: Negative for nausea, vomiting, abdominal pain, diarrhea, constipation, blood in stool and abdominal distention.  Genitourinary: Negative for dysuria, urgency, frequency, hematuria, flank pain, vaginal bleeding, vaginal discharge, difficulty urinating, vaginal pain and pelvic pain.  Musculoskeletal: Positive for arthralgias and gait problem. Negative for joint swelling.  Skin: Negative for rash.  Neurological: Positive for weakness. Negative for dizziness, syncope, speech difficulty, numbness and headaches.  Hematological: Negative for adenopathy.  Psychiatric/Behavioral: Negative for behavioral problems, dysphoric mood and agitation. The patient is not nervous/anxious.        Objective:   Physical Exam  Constitutional: She is oriented to person, place, and time. She appears well-developed and well-nourished.  HENT:  Head: Normocephalic.  Right Ear: External ear normal.  Left Ear: External ear normal.  Mouth/Throat: Oropharynx is clear and moist.  Eyes: Conjunctivae and EOM are normal. Pupils  are equal, round, and reactive to light.  Neck: Normal range of motion. Neck supple. No thyromegaly present.  Cardiovascular: Normal rate, regular rhythm, normal heart sounds and intact distal pulses.   Pulmonary/Chest: Effort normal and breath sounds normal.  Abdominal: Soft. Bowel sounds are normal. She exhibits no mass. There is no tenderness.  Musculoskeletal: Normal range of motion.  Lymphadenopathy:    She has no cervical adenopathy.  Neurological: She is alert and oriented to person, place, and time.  Skin: Skin is warm and dry. No rash noted.  Psychiatric: She has a normal mood and affect. Her behavior is normal.          Assessment & Plan:   Hypertension stable Pernicious anemia. We'll get a B12 shot Cerebrovascular disease clinically stable Coronary artery disease. Asymptomatic

## 2011-01-15 ENCOUNTER — Ambulatory Visit (INDEPENDENT_AMBULATORY_CARE_PROVIDER_SITE_OTHER): Payer: PRIVATE HEALTH INSURANCE | Admitting: Internal Medicine

## 2011-01-15 DIAGNOSIS — D510 Vitamin B12 deficiency anemia due to intrinsic factor deficiency: Secondary | ICD-10-CM

## 2011-01-15 MED ORDER — CYANOCOBALAMIN 1000 MCG/ML IJ SOLN
1000.0000 ug | Freq: Once | INTRAMUSCULAR | Status: AC
  Administered 2011-01-15: 1000 ug via INTRAMUSCULAR

## 2011-02-15 ENCOUNTER — Ambulatory Visit (INDEPENDENT_AMBULATORY_CARE_PROVIDER_SITE_OTHER): Payer: Medicare Other | Admitting: Internal Medicine

## 2011-02-15 DIAGNOSIS — D510 Vitamin B12 deficiency anemia due to intrinsic factor deficiency: Secondary | ICD-10-CM

## 2011-02-15 MED ORDER — CYANOCOBALAMIN 1000 MCG/ML IJ SOLN
1000.0000 ug | Freq: Once | INTRAMUSCULAR | Status: AC
  Administered 2011-02-15: 1000 ug via INTRAMUSCULAR

## 2011-03-05 ENCOUNTER — Observation Stay (HOSPITAL_COMMUNITY)
Admission: EM | Admit: 2011-03-05 | Discharge: 2011-03-06 | DRG: 313 | Disposition: A | Discharge status: Home / Self Care | Payer: Medicare Other | Source: Ambulatory Visit | Attending: Internal Medicine | Admitting: Internal Medicine

## 2011-03-05 ENCOUNTER — Other Ambulatory Visit: Payer: Self-pay

## 2011-03-05 ENCOUNTER — Telehealth: Payer: Self-pay | Admitting: *Deleted

## 2011-03-05 ENCOUNTER — Encounter (HOSPITAL_COMMUNITY): Payer: Self-pay | Admitting: *Deleted

## 2011-03-05 ENCOUNTER — Ambulatory Visit: Payer: Medicare Other | Admitting: Internal Medicine

## 2011-03-05 ENCOUNTER — Emergency Department (HOSPITAL_COMMUNITY): Payer: Medicare Other

## 2011-03-05 DIAGNOSIS — R079 Chest pain, unspecified: Secondary | ICD-10-CM

## 2011-03-05 DIAGNOSIS — E785 Hyperlipidemia, unspecified: Secondary | ICD-10-CM | POA: Diagnosis present

## 2011-03-05 DIAGNOSIS — R0789 Other chest pain: Principal | ICD-10-CM | POA: Diagnosis present

## 2011-03-05 DIAGNOSIS — Z8673 Personal history of transient ischemic attack (TIA), and cerebral infarction without residual deficits: Secondary | ICD-10-CM

## 2011-03-05 DIAGNOSIS — I2510 Atherosclerotic heart disease of native coronary artery without angina pectoris: Secondary | ICD-10-CM | POA: Diagnosis present

## 2011-03-05 DIAGNOSIS — I252 Old myocardial infarction: Secondary | ICD-10-CM

## 2011-03-05 DIAGNOSIS — I10 Essential (primary) hypertension: Secondary | ICD-10-CM | POA: Diagnosis present

## 2011-03-05 HISTORY — DX: Acute myocardial infarction, unspecified: I21.9

## 2011-03-05 LAB — CBC
HCT: 34.3 % — ABNORMAL LOW (ref 36.0–46.0)
MCV: 87.9 fL (ref 78.0–100.0)
Platelets: 200 10*3/uL (ref 150–400)
RBC: 3.9 MIL/uL (ref 3.87–5.11)
RDW: 14.1 % (ref 11.5–15.5)
WBC: 7.6 10*3/uL (ref 4.0–10.5)

## 2011-03-05 LAB — COMPREHENSIVE METABOLIC PANEL
ALT: 10 U/L (ref 0–35)
AST: 17 U/L (ref 0–37)
Alkaline Phosphatase: 101 U/L (ref 39–117)
CO2: 27 mEq/L (ref 19–32)
Chloride: 103 mEq/L (ref 96–112)
GFR calc Af Amer: 61 mL/min — ABNORMAL LOW (ref >90)
GFR calc non Af Amer: 53 mL/min — ABNORMAL LOW (ref >90)
Glucose, Bld: 84 mg/dL (ref 70–99)
Sodium: 139 mEq/L (ref 135–145)
Total Bilirubin: 0.3 mg/dL (ref 0.3–1.2)

## 2011-03-05 LAB — CARDIAC PANEL(CRET KIN+CKTOT+MB+TROPI)
CK, MB: 2.3 ng/mL (ref 0.3–4.0)
Relative Index: INVALID (ref 0.0–2.5)
Troponin I: <0.3 ng/mL (ref <0.30)

## 2011-03-05 LAB — DIFFERENTIAL
Basophils Absolute: 0.1 10*3/uL (ref 0.0–0.1)
Lymphocytes Relative: 17 % (ref 12–46)
Lymphs Abs: 1.3 10*3/uL (ref 0.7–4.0)
Neutro Abs: 5.3 10*3/uL (ref 1.7–7.7)

## 2011-03-05 LAB — PROTIME-INR
INR: 1.08 (ref 0.00–1.49)
Prothrombin Time: 14.2 s (ref 11.6–15.2)

## 2011-03-05 LAB — TROPONIN I: Troponin I: <0.3 ng/mL (ref <0.30)

## 2011-03-05 LAB — APTT: aPTT: 34 s (ref 24–37)

## 2011-03-05 MED ORDER — MECLIZINE HCL 25 MG PO TABS
25.0000 mg | ORAL_TABLET | Freq: Three times a day (TID) | ORAL | Status: DC | PRN
  Filled 2011-03-05: qty 1

## 2011-03-05 MED ORDER — SIMVASTATIN 5 MG PO TABS
5.0000 mg | ORAL_TABLET | Freq: Every day | ORAL | Status: DC
  Administered 2011-03-05: 5 mg via ORAL
  Filled 2011-03-05 (×2): qty 1

## 2011-03-05 MED ORDER — ASPIRIN 81 MG PO CHEW: 81.0000 mg | CHEWABLE_TABLET | Freq: Every day | ORAL | Status: DC

## 2011-03-05 MED ORDER — ASPIRIN 300 MG RE SUPP: 300.0000 mg | RECTAL | Status: DC

## 2011-03-05 MED ORDER — CLOPIDOGREL BISULFATE 75 MG PO TABS
75.0000 mg | ORAL_TABLET | Freq: Every day | ORAL | Status: DC
  Administered 2011-03-05 – 2011-03-06 (×2): 75 mg via ORAL
  Filled 2011-03-05 (×2): qty 1

## 2011-03-05 MED ORDER — ASPIRIN 325 MG PO TABS
325.0000 mg | ORAL_TABLET | Freq: Every day | ORAL | Status: DC
  Administered 2011-03-06: 325 mg via ORAL
  Filled 2011-03-05: qty 1

## 2011-03-05 MED ORDER — ZOLPIDEM TARTRATE 5 MG PO TABS: 5.0000 mg | ORAL_TABLET | Freq: Every evening | ORAL | Status: DC | PRN

## 2011-03-05 MED ORDER — TRIAMCINOLONE ACETONIDE 0.1 % EX OINT
1.0000 "application " | TOPICAL_OINTMENT | Freq: Every day | CUTANEOUS | Status: DC | PRN
  Filled 2011-03-05: qty 15

## 2011-03-05 MED ORDER — NITROGLYCERIN 0.4 MG SL SUBL: 0.4000 mg | SUBLINGUAL_TABLET | SUBLINGUAL | Status: DC | PRN

## 2011-03-05 MED ORDER — CARVEDILOL 3.125 MG PO TABS
3.1250 mg | ORAL_TABLET | Freq: Two times a day (BID) | ORAL | Status: DC
  Administered 2011-03-05 – 2011-03-06 (×2): 3.125 mg via ORAL
  Filled 2011-03-05 (×4): qty 1

## 2011-03-05 MED ORDER — ALPRAZOLAM 0.25 MG PO TABS: 0.2500 mg | ORAL_TABLET | Freq: Two times a day (BID) | ORAL | Status: DC | PRN

## 2011-03-05 MED ORDER — SODIUM CHLORIDE 0.9 % IJ SOLN: 3.0000 mL | INTRAMUSCULAR | Status: DC | PRN

## 2011-03-05 MED ORDER — ASPIRIN 81 MG PO CHEW
324.0000 mg | CHEWABLE_TABLET | ORAL | Status: AC
  Administered 2011-03-05: 324 mg via ORAL
  Filled 2011-03-05: qty 4

## 2011-03-05 MED ORDER — SODIUM CHLORIDE 0.9 % IJ SOLN
3.0000 mL | Freq: Two times a day (BID) | INTRAMUSCULAR | Status: DC
  Administered 2011-03-05 – 2011-03-06 (×2): 3 mL via INTRAVENOUS

## 2011-03-05 MED ORDER — ACETAMINOPHEN 325 MG PO TABS: 650.0000 mg | ORAL_TABLET | ORAL | Status: DC | PRN

## 2011-03-05 MED ORDER — ONDANSETRON HCL 4 MG/2ML IJ SOLN: 4.0000 mg | Freq: Four times a day (QID) | INTRAMUSCULAR | Status: DC | PRN

## 2011-03-05 MED ORDER — CYANOCOBALAMIN 1000 MCG/ML IJ SOLN
1000.0000 ug | INTRAMUSCULAR | Status: DC
  Filled 2011-03-05: qty 1

## 2011-03-05 MED ORDER — ENOXAPARIN SODIUM 40 MG/0.4ML ~~LOC~~ SOLN
40.0000 mg | SUBCUTANEOUS | Status: DC
  Administered 2011-03-05: 40 mg via SUBCUTANEOUS
  Filled 2011-03-05 (×2): qty 0.4

## 2011-03-05 MED ORDER — SODIUM CHLORIDE 0.9 % IV SOLN: 250.0000 mL | INTRAVENOUS | Status: DC | PRN

## 2011-03-05 NOTE — Telephone Encounter (Signed)
Family calls stating pt has chest heaviness and near syncope this am.  Advised to call 911 and go straight to ER.

## 2011-03-05 NOTE — ED Provider Notes (Signed)
History     CSN: [id number]  Arrival date & time 03/05/11  1133   First MD Initiated Contact with Patient 03/05/11 1505      Chief Complaint  Patient presents with  . Chest Pain    (Consider location/radiation/quality/duration/timing/severity/associated sxs/prior treatment) Patient is a 78 y.o. female presenting with chest pain. The history is provided by the patient, a relative and medical records.  Chest Pain Episode onset: about 4-5 hours ago. Duration of episode(s) is 2 hours. Chest pain occurs constantly. The chest pain is resolved. Associated with: not associated with any particular activity. The severity of the pain is moderate. The quality of the pain is described as pressure-like. The pain does not radiate. Exacerbated by: not exertional or pleuritic. Primary symptoms include shortness of breath. Pertinent negatives for primary symptoms include no fever, no fatigue, no syncope, no cough, no wheezing, no palpitations, no abdominal pain, no nausea, no vomiting, no dizziness and no altered mental status.  Pertinent negatives for associated symptoms include no claudication, no diaphoresis, no near-syncope, no numbness, no orthopnea and no weakness. She tried nothing for the symptoms.  Her past medical history is significant for CAD, hyperlipidemia, MI (x2) and strokes.  Pertinent negatives for past medical history include no seizures.  Procedure history is positive for cardiac catheterization and stress echo.     Past Medical History  Diagnosis Date  . ANEMIA, PERNICIOUS 09/10/2006  . CORONARY ARTERY DISEASE 11/07/2008  . CVA WITH LEFT HEMIPARESIS 07/22/2007  . DERMATITIS 03/02/2007  . HYPERTENSION 07/15/2006  . MENINGIOMA 08/27/2006  . OSTEOARTHRITIS 07/15/2006  . PULMONARY NODULE 01/14/2007  . Takotsubo syndrome 04/22/2008  . THYROID NODULE 01/14/2007  . IBS (irritable bowel syndrome)   . DJD (degenerative joint disease)   . Myocardial infarction     Past Surgical History    Procedure Date  . Cholecystectomy   . Abdominal hysterectomy   . Hip surgery   . Brain tumor excision   . Shoulder surgery     No family history on file.  History  Substance Use Topics  . Smoking status: Never Smoker   . Smokeless tobacco: Not on file  . Alcohol Use: No    OB History    Grav Para Term Preterm Abortions TAB SAB Ect Mult Living                  Review of Systems  Constitutional: Negative for fever, chills, diaphoresis, activity change, appetite change and fatigue.  HENT: Negative for congestion, sore throat, rhinorrhea, neck pain and neck stiffness.   Eyes: Negative for photophobia, redness and visual disturbance.  Respiratory: Positive for shortness of breath. Negative for cough and wheezing.   Cardiovascular: Positive for chest pain. Negative for palpitations, orthopnea, claudication, leg swelling, syncope and near-syncope.  Gastrointestinal: Negative for nausea, vomiting, abdominal pain, diarrhea, constipation and blood in stool.  Genitourinary: Negative for dysuria, urgency, hematuria and flank pain.  Musculoskeletal: Negative for back pain.  Skin: Negative for rash and wound.  Neurological: Negative for dizziness, seizures, facial asymmetry, speech difficulty, weakness, light-headedness, numbness and headaches.  Psychiatric/Behavioral: Negative for confusion and altered mental status.  All other systems reviewed and are negative.    Allergies  Atorvastatin; Phenytoin; Promethazine hcl; Sulfacetamide sodium; and Sulfamethoxazole  Home Medications   Current Outpatient Rx  Name Route Sig Dispense Refill  . CARVEDILOL 6.25 MG PO TABS  One half tablet twice daily 180 tablet 5  . CLOPIDOGREL BISULFATE 75 MG PO TABS Oral Take 1  tablet (75 mg total) by mouth daily. 90 tablet 6  . ESTROGENS, CONJUGATED 0.625 MG/GM VA CREA Vaginal Place 1 g vaginally daily as needed.     . CYANOCOBALAMIN 1000 MCG/ML IJ SOLN Intramuscular Inject 1,000 mcg into the muscle  every 30 (thirty) days.      Marland Kitchen MECLIZINE HCL 25 MG PO TABS Oral Take 25 mg by mouth 3 (three) times daily as needed. Dizziness    . NITROGLYCERIN 0.4 MG SL SUBL Sublingual Place 0.4 mg under the tongue every 5 (five) minutes as needed.      Marland Kitchen PRAVASTATIN SODIUM 20 MG PO TABS Oral Take 1 tablet (20 mg total) by mouth every evening. 90 tablet 11  . TRIAMCINOLONE ACETONIDE 0.1 % EX OINT Topical Apply 1 application topically daily as needed. Dry skin      BP 152/57  Pulse 55  Temp(Src) 97.6 F (36.4 C) (Oral)  Resp 17  Ht 5\' 5"  (1.651 m)  Wt 125 lb (56.7 kg)  BMI 20.80 kg/m2  SpO2 100%  Physical Exam  Nursing note and vitals reviewed. Constitutional: She is oriented to person, place, and time. She appears well-developed and well-nourished.  Non-toxic appearance. No distress.  HENT:  Head: Normocephalic and atraumatic.  Mouth/Throat: Oropharynx is clear and moist.  Eyes: Conjunctivae and EOM are normal. Pupils are equal, round, and reactive to light. No scleral icterus.  Neck: Normal range of motion. Neck supple. No JVD present.  Cardiovascular: Normal rate, regular rhythm, normal heart sounds and intact distal pulses.   No murmur heard. Pulmonary/Chest: Effort normal and breath sounds normal. No respiratory distress. She has no wheezes. She has no rales.  Abdominal: Soft. Bowel sounds are normal. She exhibits no distension. There is no tenderness. There is no rebound and no guarding.  Musculoskeletal: Normal range of motion.  Neurological: She is alert and oriented to person, place, and time. She has normal strength. No cranial nerve deficit. GCS eye subscore is 4. GCS verbal subscore is 5. GCS motor subscore is 6.  Skin: Skin is warm and dry. No rash noted. She is not diaphoretic.  Psychiatric: She has a normal mood and affect.    ED Course  Procedures (including critical care time)  Labs Reviewed  CBC - Abnormal; Notable for the following:    Hemoglobin 11.4 (*)    HCT 34.3 (*)     All other components within normal limits  COMPREHENSIVE METABOLIC PANEL - Abnormal; Notable for the following:    GFR calc non Af Amer 53 (*)    GFR calc Af Amer 61 (*)    All other components within normal limits  TROPONIN I  DIFFERENTIAL  APTT  PROTIME-INR   Dg Chest Portable 1 View  03/05/2011  *RADIOLOGY REPORT*  Clinical Data: Chest pain, weakness.  PORTABLE CHEST - 1 VIEW  Comparison: 08/15/2010 chest CT.  Findings: Mild cardiomegaly.  No confluent airspace opacities.  No effusions.  No acute bony abnormality.  Degenerative changes in the thoracic spine.  IMPRESSION:  Cardiomegaly.  No active disease.  Original Report Authenticated By: Cyndie Chime, M.D.     1. Chest pain       MDM  78yo CF with PMH significant for HLD, CVA, and CAD with prior MI x2 who presents to the ED with her daughter due to chest pain. Pain described as heavy pressure in her central chest. Onset about 4-5h ago while sitting down. Lasted about 2-2.5h and resolved spontaneously. No pleuritic component. Did not  exert herself so uncertain if worse with exertion. EKG w/o ST changes. Not c/w PE or dissection. Concern for possible UA vs NSTEMI. Pt refuses Aspirin because her dr told her not to take it. She did however take her plavix this morning.   Labs ordered in triage without troponin elevation. CXR w/o acute. Cardiology consulted.   Cards admitting.         Verne Carrow, MD 03/05/11 [phone]

## 2011-03-05 NOTE — ED Notes (Signed)
Patient reports onset of chest pain/pressure at 1050 when eating her oatmeal.  Patient states the pain makes her feel weak and she gets sweaty.  Patient states she had similar episode 2 days ago

## 2011-03-05 NOTE — ED Notes (Signed)
Patient resting with NAD at this time. Patient remains on monitor and sats are 98% RA.

## 2011-03-05 NOTE — ED Notes (Signed)
[facility] at bedside. 

## 2011-03-05 NOTE — ED Notes (Signed)
Patient states that she was having heaviness in her chest and she felt like she was going to pass out starting today. Patient states she had one episode of diarrhea and she began to feel better. Patient denies chest pain, N/V. Patient states she feels weak. Patient remains on monitor and oxygen saturation of 100% on RA. Family at bedside.

## 2011-03-05 NOTE — ED Notes (Signed)
2011-01 Ready 

## 2011-03-05 NOTE — ED Notes (Signed)
Family at bedside. 

## 2011-03-05 NOTE — ED Notes (Signed)
Report called to Shanda Bumps, RN on 2000. Patient placed on zoll and being transported to 2011.

## 2011-03-05 NOTE — ED Notes (Signed)
Patient resting and remains on monitor ans sats are 97% RA. NAD at this time. Family at bedside.

## 2011-03-05 NOTE — H&P (Signed)
History and Physical   Patient ID: [name] MRN: [id number], DOB/AGE: November 30, 1922   Admit date: 03/05/2011 Date of Consult: 03/05/2011   Primary Physician: Rogelia Boga, MD, MD Primary Cardiologist: Jerral Bonito, MD  Pt. Profile: [name] is a 78 yo female with PMHx significant for hx of Takotsubo syndrome (on 06/2006 cardiac cath; apical ballooning, EF 45%, MR), CAD (s/p NSTEMI and STEMI in 2001 and 2008 respectively, medically managed, most recent cath in 2009 for chest pain revealed nonobstructive CAD with recommended medical management), CVA with left hemiparesis, pulmonary nodule, left shoulder orthopedic problems (left cervical facet and impingement syndromes), HTN and HL who presented to [facility] ED with chest pain.   She was last seen by Dr. Myrtis Ser in 01/12. She c/o shoulder pain radiating to L anterior chest at the time with no evidence of ischemia. She has been seen in the office several times in the past with complains of chest pain. It was found that cardiac source was unlikely.   Most recent cath in 2009:  FINDINGS:   1. The left main coronary artery is a large vessel that bifurcates into the circumflex, the LAD and a moderate size ramus branch. There was no evidence of disease in the left main coronary artery.   2. The left anterior descending coronary artery is a large vessel that       courses to the apex.  It gives off a large diagonal branch that is       bifurcating and has a 30% ostial lesion.  There is 20-30% plaque       noted in the ostium on the LAD as well as a 30% plaque noted in the       mid LAD.  There is no obstructive disease noted in the LAD.   3. There is a moderate size short ramus intermedius branch that has an       ostial 40-50% stenosis.   4. Circumflex is a moderate-to-large size vessel that gives off three       large obtuse marginal branches.  There is a 30% stenosis noted in       the proximal portion of the circumflex.  The obtuse  marginal       branches are free of disease.   5. The right coronary artery is a dominant vessel that has plaque       disease noted in the proximal portion and a 30% stenosis noted in       the midportion.  There are luminal irregularities noted in the       distal vessel.  This vessel does bifurcate into the PDA and a       posterolateral branch.   6. Left ventricular angiogram demonstrated low normal systolic       function with an ejection fraction estimated at 50%.  There were no       focal wall motion abnormalities noted.      HEMODYNAMIC FINDINGS:  Left ventricular pressure 158/1, end-diastolic   pressure 9, central aortic pressure 163/73.      IMPRESSION:   1. Nonobstructive coronary artery disease.   2. Low normal left ventricular systolic dysfunction.   3. Noncardiac etiology of chest pain.   Problem List: Past Medical History  Diagnosis Date  . ANEMIA, PERNICIOUS 09/10/2006  . CORONARY ARTERY DISEASE 11/07/2008  . CVA WITH LEFT HEMIPARESIS 07/22/2007  . DERMATITIS 03/02/2007  . HYPERTENSION 07/15/2006  . MENINGIOMA 08/27/2006  . OSTEOARTHRITIS 07/15/2006  .  PULMONARY NODULE 01/14/2007  . Takotsubo syndrome 04/22/2008  . THYROID NODULE 01/14/2007  . IBS (irritable bowel syndrome)   . DJD (degenerative joint disease)   . Myocardial infarction   . Traumatic subdural hematoma 2007    Past Surgical History  Procedure Date  . Cholecystectomy   . Abdominal hysterectomy   . Hip surgery   . Brain tumor excision     Meningioma; treated at [facility] in 2008  . Shoulder surgery      Allergies:  Allergies  Allergen Reactions  . Atorvastatin Other (See Comments)    Raises liver enzymes  . Phenytoin Other (See Comments)    Blood clots  . Promethazine Hcl Other (See Comments)    Severe sedation  . Sulfacetamide Sodium     REACTION: sulfa  . Sulfamethoxazole     REACTION: unspecified    HPI:   She reports experiencing chest discomfort described as constant, central  heaviness on Sunday morning and this morning lasting for approximately 2 hours without relief. The first episode occurred after cooking a meal, and remitted after ~ 2 hours of rest. Today's episode occurred after eating. She was at rest during both occurrences. She reports associated diaphoresis, shortness of breath, weakness, palpitations and lightheadedness. These episodes were similar in quality, but less in severity to prior MIs. BP taken this morning during chest pain normal. She denies history of heart burn, DOE, decrease in activity level, orthopnea, n/v/f/c, PND and LE edema. No sick contacts. Her baseline activity level is limited to household chores. She has a regular follow-up appointment with Dr. Myrtis Ser on Friday.  She called her daughter who transported her to [facility] ED who is her POA. EKG reveals no ischemic changes, CXR unremarkable for acute cardiopulmonary process, stable cardiomegaly. TnI neg x 1.   Inpatient Medications:     . aspirin  324 mg Oral NOW  . aspirin  81 mg Oral Daily  . carvedilol  3.125 mg Oral BID WC  . clopidogrel  75 mg Oral Daily  . cyanocobalamin  1,000 mcg Intramuscular Q30 days  . simvastatin  5 mg Oral q1800  . DISCONTD: aspirin  81 mg Oral Daily  . DISCONTD: aspirin  300 mg Rectal NOW    (Not in a hospital admission)  Family History  Problem Relation Age of Onset  . Heart attack Brother 23     History   Social History  . Marital Status: Widowed    Spouse Name: N/A    Number of Children: 4  . Years of Education: N/A   Occupational History  .     Social History Main Topics  . Smoking status: Never Smoker   . Smokeless tobacco: Not on file  . Alcohol Use: No  . Drug Use: No  . Sexually Active: Not on file   Other Topics Concern  . Not on file   Social History Narrative   Widowed. Lives in [place], [place] with daughter.      Review of Systems: General: negative for chills, fever, night sweats or weight changes.  Cardiovascular:  positive for chest pain, shortness of breath, negative for edema, orthopnea, palpitations, paroxysmal nocturnal dyspnea or shortness of breath Dermatological: negative for rash Respiratory: negative for cough or wheezing Urologic: negative for hematuria Abdominal: negative for nausea, vomiting, diarrhea, bright red blood per rectum, melena, or hematemesis Neurologic: negative for visual changes, syncope, or dizziness All other systems reviewed and are otherwise negative except as noted above.  Physical Exam: Blood pressure  144/66, pulse 54, temperature 97.6 F (36.4 C), temperature source Oral, resp. rate 16, height 5\' 5"  (1.651 m), weight 56.7 kg (125 lb), SpO2 100.00%.   General: Elderly, in NAD Head: Normocephalic, atraumatic, sclera non-icteric, no xanthomas, nares are without discharge. Neck: Negative for carotid bruits. JVD not elevated. Lungs: Clear bilaterally to auscultation without wheezes, rales, or rhonchi. Breathing is unlabored. Heart: RRR with S1 S2. No murmurs, rubs, or gallops appreciated. Abdomen: Soft, non-tender, non-distended with normoactive bowel sounds. No hepatomegaly. No rebound/guarding. No obvious abdominal masses. Msk:  L shoulder/chest pain on passive movement and with palpation. Strength and tone appears normal for age. Extremities: No clubbing, cyanosis or edema.  Distal pedal pulses are 2+ and equal bilaterally. Neuro: Alert and oriented X 3. Moves all extremities spontaneously. Psych:  Responds to questions appropriately with a normal affect.  Labs: Recent Labs  Basename 03/05/11 1241   WBC 7.6   HGB 11.4*   HCT 34.3*   MCV 87.9   PLT 200    Lab 03/05/11 1241  NA 139  K 4.5  CL 103  CO2 27  BUN 17  CREATININE 0.94  CALCIUM 9.2  PROT 6.6  BILITOT 0.3  ALKPHOS 101  ALT 10  AST 17  AMYLASE --  LIPASE --  GLUCOSE 84   Recent Labs  Basename 03/05/11 1241   CKTOTAL --   CKMB --   CKMBINDEX --   TROPONINI <0.30   Radiology/Studies:  No results found.  EKG: NSR, 65 bpm, TWI V1 unchanged from prior tracing, no other ST-T wave changes  ASSESSMENT AND PLAN:   1. Chest pain- pt with significant cardiac history in the past and cardiac risk factors. EKG unremarkable for ischemic changes and TnI neg x 1. She is currently chest pain free. Most recent cardiac cath in 2009 revealed nonobstructive CAD. She has not had a cardiac work-up since then. She reports a similar quality to her chest pain as prior MIs, but not as severe. Given her history and risk factor profile, we will admit for 24-hour ACS rule-out. If rules out, would benefit from outpatient stress testing.   - 24-hr obs  - Cycle CEs  - AM EKG  - Continue Plavix, BB, statin, NTG SL PRN    2. HTN- relatively well-controlled  - Continue outpatient BB  3. HL- continue statin   Signed, R. Hurman Horn, PA-C 03/05/2011, 5:10 PM   Cardiology Attending  Patient seen and examined. I agree with the documentation of the history and physical exam by Mr. Arguello as above. She has a h/o intermittent chest pain and had recurrent pain over the past two days, now pain free. Her initial enzymes are negative and ECG is unchanged. Will admit, check serial enzymes and an a.m. ECG and make additional recommendations based on her symptoms and lab results. I would be inclined to allow her to be discharged with close followup with Dr. Myrtis Ser if she rules out and her pain is resolved.

## 2011-03-05 NOTE — ED Notes (Signed)
Patient remains on monitor and sats are 98% RA. NAD at this time. Family at bedside.

## 2011-03-06 ENCOUNTER — Encounter: Payer: Self-pay | Admitting: Cardiology

## 2011-03-06 ENCOUNTER — Other Ambulatory Visit: Payer: Self-pay

## 2011-03-06 DIAGNOSIS — R9430 Abnormal result of cardiovascular function study, unspecified: Secondary | ICD-10-CM | POA: Insufficient documentation

## 2011-03-06 DIAGNOSIS — IMO0002 Reserved for concepts with insufficient information to code with codable children: Secondary | ICD-10-CM | POA: Insufficient documentation

## 2011-03-06 DIAGNOSIS — S066X9A Traumatic subarachnoid hemorrhage with loss of consciousness of unspecified duration, initial encounter: Secondary | ICD-10-CM | POA: Insufficient documentation

## 2011-03-06 LAB — CARDIAC PANEL(CRET KIN+CKTOT+MB+TROPI)
CK, MB: 2.1 ng/mL (ref 0.3–4.0)
CK, MB: 2 ng/mL (ref 0.3–4.0)
Total CK: 52 U/L (ref 7–177)
Troponin I: <0.3 ng/mL (ref <0.30)

## 2011-03-06 LAB — HEMOGLOBIN A1C
Hgb A1c MFr Bld: 5.9 % — ABNORMAL HIGH (ref <5.7)
Mean Plasma Glucose: 123 mg/dL — ABNORMAL HIGH (ref <117)

## 2011-03-06 LAB — LIPID PANEL
Cholesterol: 142 mg/dL (ref 0–200)
HDL: 52 mg/dL (ref >39)
LDL Cholesterol: 68 mg/dL (ref 0–99)
Triglycerides: 112 mg/dL (ref <150)

## 2011-03-06 LAB — TSH: TSH: 0.751 u[IU]/mL (ref 0.350–4.500)

## 2011-03-06 MED ORDER — ASPIRIN 325 MG PO TABS: 325.0000 mg | ORAL_TABLET | Freq: Every day | ORAL | Status: DC

## 2011-03-06 NOTE — Progress Notes (Signed)
Patient Name: [name] Date of Encounter: 03/06/2011     Principal Problem:  *Chest pain Active Problems:  HYPERTENSION  CORONARY ARTERY DISEASE  Hyperlipidemia    SUBJECTIVE: In good spirits. No chest pain, palpitations, sob, diaphoresis, lightheadedness or n/v today.    OBJECTIVE  Filed Vitals:   03/05/11 1850 03/05/11 1938 03/06/11 0153 03/06/11 0524  BP: 149/67 140/65  96/42  Pulse: 56 63  56  Temp:  97 F (36.1 C)  96.5 F (35.8 C)  TempSrc:  Oral  Oral  Resp: 21 18  16   Height:  5\' 5"  (1.651 m)    Weight:  55.8 kg (123 lb 0.3 oz) 55.4 kg (122 lb 2.2 oz)   SpO2: 100% 97%  94%   No intake or output data in the 24 hours ending 03/06/11 0758 Weight change:   PHYSICAL EXAM  General: Elderly, in NAD Head: Normocephalic, atraumatic, sclera non-icteric, no xanthomas, nares are without discharge.  Neck: Supple without bruits or JVD. Lungs:  Resp regular and unlabored, CTAB without wheezes, rales or rhonchi Heart: RRR no s3, s4, or murmurs. Abdomen: Soft, non-tender, non-distended, BS + x 4.  Msk:  Strength and tone appears normal for age. Extremities: No clubbing, cyanosis or edema. DP/PT/Radials 2+ and equal bilaterally. Neuro: Alert and oriented X 3. Moves all extremities spontaneously. Psych: Normal affect.  LABS:  Recent Labs  [facility] 03/05/11 1241   WBC 7.6   HGB 11.4*   HCT 34.3*   MCV 87.9   PLT 200    Lab 03/05/11 1241  NA 139  K 4.5  CL 103  CO2 27  BUN 17  CREATININE 0.94  CALCIUM 9.2  PROT 6.6  BILITOT 0.3  ALKPHOS 101  ALT 10  AST 17  AMYLASE --  LIPASE --  GLUCOSE 84   Recent Labs  Basename 03/05/11 1959   HGBA1C 5.9*   Recent Labs  Basename 03/06/11 0220 03/05/11 1959 03/05/11 1241   CKTOTAL 52 64 --   CKMB 2.1 2.3 --   CKMBINDEX -- -- --   TROPONINI <0.30 <0.30 <0.30   No components found with this basename: POCBNP Recent Labs  Basename 03/06/11 0220   CHOL 142   HDL 52   LDLCALC 68   TRIG 112   CHOLHDL  2.7   LDLDIRECT --    Basename 03/05/11 1959  TSH 0.751  T4TOTAL --  T3FREE --  THYROIDAB --   TELE: NSR, 60-70 bpm, occasional PACs  ECG: NSR, 61 bpm, occasional PACs, TWI V1 unchanged from previous tracings, no ST-T wave changes  Radiology/Studies:  Dg Chest Portable 1 View  03/05/2011  *RADIOLOGY REPORT*  Clinical Data: Chest pain, weakness.  PORTABLE CHEST - 1 VIEW  Comparison: 08/15/2010 chest CT.  Findings: Mild cardiomegaly.  No confluent airspace opacities.  No effusions.  No acute bony abnormality.  Degenerative changes in the thoracic spine.  IMPRESSION:  Cardiomegaly.  No active disease.  Original Report Authenticated By: Cyndie Chime, M.D.    Current Medications:     . aspirin  324 mg Oral NOW  . aspirin  325 mg Oral Daily  . carvedilol  3.125 mg Oral BID WC  . clopidogrel  75 mg Oral Daily  . cyanocobalamin  1,000 mcg Intramuscular Q30 days  . enoxaparin  40 mg Subcutaneous Q24H  . simvastatin  5 mg Oral q1800  . sodium chloride  3 mL Intravenous Q12H  . DISCONTD: aspirin  81 mg Oral Daily  .  DISCONTD: aspirin  81 mg Oral Daily  . DISCONTD: aspirin  300 mg Rectal NOW    ASSESSMENT AND PLAN:  1. Chest pain- pt asymptomatic this morning. CEs neg x 3 and EKG unremarkable for ischemic changes. She has effectively ruled out for ACS. She is stable for discharge today, will confirm with Dr. Kirke Corin. She has an appointment with Dr. Myrtis Ser on Friday, at which time further work-up may be pursued.    2. HTN- relatively well-controlled  - Continue Coreg  3. HL- very well-controlled  - Continue statin  Signed, R. Hurman Horn, PA-C 03/06/2011, 7:58 AM

## 2011-03-06 NOTE — Progress Notes (Signed)
UR Completed. Simmons, Dalaney Needle F [phone]  

## 2011-03-06 NOTE — Progress Notes (Signed)
Pt. Discharged 03/06/2011  2:31 PM Discharge instructions reviewed with patient/family. Patient/family verbalized understanding. All Rx's given. Questions answered as needed. Pt. Discharged to home with family/self. Taken off unit via W/C. Burnice Vassel, Chrystine Oiler

## 2011-03-06 NOTE — ED Provider Notes (Signed)
I saw and evaluated the patient, reviewed the resident's note and I agree with the findings and plan and agree with their ECG interpretation. Chest pain with cardiac history. EKG was reassuring. Enzymes are negative. Patient states the pain is like her previous heart attack. She was admitted to a cardiologist.  [name]. Rubin Payor, MD 03/06/11 4696

## 2011-03-06 NOTE — Progress Notes (Signed)
I have seen and examined the patient. I agree with the above note with the addition of : She ruled out for MI. No more chest pain. Can discharge home with outpatient follow up.   Lorine Bears 03/06/2011 11:58 AM

## 2011-03-06 NOTE — Discharge Summary (Signed)
Discharge Summary   Patient ID: [name],  MRN: [id number], DOB/AGE: 76/17/1924 76 y.o.  Admit date: 03/05/2011 Discharge date: 03/06/2011  Discharge Diagnoses Principal Problem:  *Chest pain Active Problems:  HYPERTENSION  CORONARY ARTERY DISEASE  Hyperlipidemia   Allergies Allergies  Allergen Reactions  . Atorvastatin Other (See Comments)    Raises liver enzymes  . Phenytoin Other (See Comments)    Blood clots  . Promethazine Hcl Other (See Comments)    Severe sedation  . Sulfacetamide Sodium     REACTION: sulfa  . Sulfamethoxazole     REACTION: unspecified    Procedures  None  History of Present Illness  [name] is a 78 yo female with PMHx significant for hx of Takotsubo syndrome (on 06/2006 cardiac cath; apical ballooning, EF 45%, MR), CAD (s/p NSTEMI and STEMI in 2001 and 2008 respectively, medically managed, most recent cath in 2009 for chest pain revealed nonobstructive CAD with recommended medical management), CVA with left hemiparesis, pulmonary nodule, left shoulder orthopedic problems (left cervical facet and impingement syndromes), HTN and HL who was admitted to [facility] ED for chest pain rule out.   She reported experiencing chest discomfort described as constant, central heaviness on Sunday morning and this morning lasting for approximately 2 hours without relief. The first episode occurred after cooking a meal, and remitted after ~ 2 hours of rest. The most recent episode occurred after eating. She was at rest during both occurrences. She reported associated diaphoresis, shortness of breath, weakness, palpitations and lightheadedness. These episodes were similar in quality, but less in severity to prior MIs. BP taken this morning during chest pain normal. She denied history of heart burn, DOE, decrease in activity level, orthopnea, n/v/f/c, PND and LE edema. No sick contacts. Her baseline activity level is limited to household chores. She has a regular  follow-up appointment with Dr. Myrtis Ser on Friday.   She called her daughter who transported her to [facility] ED who is her POA. EKG revealed no ischemic changes, CXR was unremarkable for acute cardiopulmonary process, stable cardiomegaly. TnI neg x 1. Given her significant cardiac history and risk factors, she was subsequently admitted for 24-hour observation for ACS rule out.  Hospital Course   She remained asymptomatic throughout her admission. There were no acute changes on telemetry. CEs neg x 3. She effectively ruled out from an ACS standpoint. She is currently stable at baseline and will be discharged home today. There will be no changes to her outpatient medications. She is to follow-up with Dr. Myrtis Ser on Friday as scheduled at which time further work-up will be determined.   Discharge Vitals:  Blood pressure 103/55, pulse 62, temperature 97.9 F (36.6 C), temperature source Oral, resp. rate 20, height 5\' 5"  (1.651 m), weight 55.4 kg (122 lb 2.2 oz), SpO2 96.00%.   Labs: Recent Labs  Basename 03/05/11 1241   WBC 7.6   HGB 11.4*   HCT 34.3*   MCV 87.9   PLT 200    Lab 03/05/11 1241  NA 139  K 4.5  CL 103  CO2 27  BUN 17  CREATININE 0.94  CALCIUM 9.2  PROT 6.6  BILITOT 0.3  ALKPHOS 101  ALT 10  AST 17  AMYLASE --  LIPASE --  GLUCOSE 84   Recent Labs  Basename 03/05/11 1959   HGBA1C 5.9*   Recent Labs  Basename 03/06/11 0758 03/06/11 0220 03/05/11 1959   CKTOTAL 52 52 64   CKMB 2.0 2.1 2.3   CKMBINDEX -- -- --  TROPONINI <0.30 <0.30 <0.30   Recent Labs  Basename 03/06/11 0220   CHOL 142   HDL 52   LDLCALC 68   TRIG 112   CHOLHDL 2.7   LDLDIRECT --    Basename 03/05/11 1959  TSH 0.751  T4TOTAL --  T3FREE --  THYROIDAB --   Disposition:  Discharge Orders    Future Appointments: Provider: Department: Dept Phone: Center:   03/08/2011 9:30 AM Luis Abed, MD [facility] [phone] [facility]   03/19/2011 11:45 AM Rogelia Boga, MD  [facility] [phone] [facility]   06/18/2011 10:00 AM Rogelia Boga, MD [facility] [phone] [facility]     Follow-up Information    Follow up with Willa Rough, MD on 03/08/2011. (As scheduled. )    Contact information:   [address] [place] [address] [phone]         Discharge Medications:  Medication List  As of 03/06/2011  1:51 PM   START taking these medications         aspirin 325 MG tablet   Take 1 tablet (325 mg total) by mouth daily.         CONTINUE taking these medications         carvedilol 6.25 MG tablet   Commonly known as: COREG   One half tablet twice daily      clopidogrel 75 MG tablet   Commonly known as: PLAVIX   Take 1 tablet (75 mg total) by mouth daily.      COBAL-1000 1000 MCG/ML injection   Generic drug: cyanocobalamin      conjugated estrogens vaginal cream   Commonly known as: PREMARIN      meclizine 25 MG tablet   Commonly known as: ANTIVERT      nitroGLYCERIN 0.4 MG SL tablet   Commonly known as: NITROSTAT      pravastatin 20 MG tablet   Commonly known as: PRAVACHOL   Take 1 tablet (20 mg total) by mouth every evening.      triamcinolone ointment 0.1 %   Commonly known as: KENALOG          Where to get your medications       Information on where to get these meds is not yet available. Ask your nurse or doctor.         aspirin 325 MG tablet           Outstanding Labs/Studies: None  Duration of Discharge Encounter: 35 minutes including physician time.  Signed, R. Hurman Horn, PA-C 03/06/2011, 1:51 PM

## 2011-03-06 NOTE — Telephone Encounter (Signed)
Dr. K notified. 

## 2011-03-08 ENCOUNTER — Encounter: Payer: Self-pay | Admitting: Cardiology

## 2011-03-08 ENCOUNTER — Ambulatory Visit (INDEPENDENT_AMBULATORY_CARE_PROVIDER_SITE_OTHER): Payer: Medicare Other | Admitting: Cardiology

## 2011-03-08 DIAGNOSIS — I69959 Hemiplegia and hemiparesis following unspecified cerebrovascular disease affecting unspecified side: Secondary | ICD-10-CM

## 2011-03-08 DIAGNOSIS — R079 Chest pain, unspecified: Secondary | ICD-10-CM

## 2011-03-08 DIAGNOSIS — I10 Essential (primary) hypertension: Secondary | ICD-10-CM

## 2011-03-08 NOTE — Assessment & Plan Note (Signed)
The patient is doing well today. She did have recurring chest pain that took her to the hospital last week. There was no ischemia. Troponins were normal. There was no EKG change. She is now stable. She needs no further workup. It is of note that she has complex disease related to her prior meningioma and other issues in her head. Because of that she's been on Plavix but not aspirin. Aspirin was added during the hospitalization. I feel is most prudent in her case however at this time to stop her aspirin. She'll remain on Plavix.

## 2011-03-08 NOTE — Assessment & Plan Note (Signed)
The patient has been stable since her last neurologic event. She had a pontine infarct in 2010. Aggrenox had been used. In 2012 she had GI symptoms. Therefore she was changed to Plavix only by neurology. With her recent hospitalization aspirin was added. I have chosen to stop her aspirin and keep her on Plavix only.

## 2011-03-08 NOTE — Patient Instructions (Signed)
Your physician wants you to follow-up in: 6 months.  You will receive a reminder letter in the mail two months in advance. If you don't receive a letter, please call our office to schedule the follow-up appointment.  Your physician has recommended you make the following change in your medication: Stop your aspirin

## 2011-03-08 NOTE — Assessment & Plan Note (Signed)
Blood pressure is well controlled today. She is tolerating her current medicines. No change in therapy.

## 2011-03-08 NOTE — Progress Notes (Signed)
HPI Patient is seen today to followup for cardiac status. I saw her last January, 2012. She has chronic shoulder pain. However she had not had any major chest pain. Unfortunately just one week ago she had pain and was admitted to the hospital. Our team saw her. I have reviewed all of the hospital records carefully. There was no evidence of ischemia. It was felt that she was stable. She was discharged home. Today she is back and she stable.  As part of today's evaluation I have carefully reviewed the H&P and the discharge summary and the records and labs from her hospitalization.  Allergies  Allergen Reactions  . Atorvastatin Other (See Comments)    Raises liver enzymes  . Phenytoin Other (See Comments)    Blood clots  . Promethazine Hcl Other (See Comments)    Severe sedation  . Sulfacetamide Sodium     REACTION: sulfa  . Sulfamethoxazole     REACTION: unspecified    Current Outpatient Prescriptions  Medication Sig Dispense Refill  . aspirin 325 MG tablet Take 1 tablet (325 mg total) by mouth daily.      . carvedilol (COREG) 6.25 MG tablet One half tablet twice daily  180 tablet  5  . clopidogrel (PLAVIX) 75 MG tablet Take 1 tablet (75 mg total) by mouth daily.  90 tablet  6  . conjugated estrogens (PREMARIN) vaginal cream Place 1 g vaginally daily as needed.       . cyanocobalamin (COBAL-1000) 1000 MCG/ML injection Inject 1,000 mcg into the muscle every 30 (thirty) days.        . meclizine (ANTIVERT) 25 MG tablet Take 25 mg by mouth 3 (three) times daily as needed. Dizziness      . nitroGLYCERIN (NITROSTAT) 0.4 MG SL tablet Place 0.4 mg under the tongue every 5 (five) minutes as needed.        . pravastatin (PRAVACHOL) 20 MG tablet Take 1 tablet (20 mg total) by mouth every evening.  90 tablet  11  . triamcinolone (KENALOG) 0.1 % ointment Apply 1 application topically daily as needed. Dry skin        History   Social History  . Marital Status: Widowed    Spouse Name: N/A   Number of Children: 4  . Years of Education: N/A   Occupational History  .     Social History Main Topics  . Smoking status: Never Smoker   . Smokeless tobacco: Not on file  . Alcohol Use: No  . Drug Use: No  . Sexually Active: Not on file   Other Topics Concern  . Not on file   Social History Narrative   Widowed. Lives in [place], [place] with daughter.     Family History  Problem Relation Age of Onset  . Heart attack Brother 33    Past Medical History  Diagnosis Date  . ANEMIA, PERNICIOUS 09/10/2006  . CORONARY ARTERY DISEASE 11/07/2008  . CVA WITH LEFT HEMIPARESIS 07/22/2007    May, 2009, neurology decided aspirin and Plavix at that time.  . DERMATITIS 03/02/2007  . HYPERTENSION 07/15/2006  . MENINGIOMA 08/27/2006    Occipital meningioma, surgery, [facility], 2008 /  MRI, [place], June, 2011, no change in lesion, history believe the patient had a gamma knife treatment of a right occipital atypical meningioma  . OSTEOARTHRITIS 07/15/2006  . PULMONARY NODULE 01/14/2007    [place], December, 2011  . Takotsubo syndrome 04/22/2008    MI, May, 2008, question takotsubo event  . THYROID  NODULE 01/14/2007  . IBS (irritable bowel syndrome)   . DJD (degenerative joint disease)   . Ejection fraction     EF 60%, echo, July, 2010, (  EF improved after tach a suitable event 2008)  . Subarachnoid hemorrhage following injury 2007    Subarachnoid hemorrhage secondary to a fall December, 2007    Past Surgical History  Procedure Date  . Cholecystectomy   . Abdominal hysterectomy   . Hip surgery   . Brain tumor excision     Meningioma; treated at [facility] in 2008  . Shoulder surgery     ROS  Patient denies fever, chills, headache, sweats, rash, change in vision, change in hearing, chest pain, cough, nausea vomiting, urinary symptoms. She has chronic discomfort in her left shoulder. All other systems are reviewed and are negative.  PHYSICAL EXAM Patient is here with her daughter. She is  elderly and frail but completely alert. She is oriented to person time and place. Affect is normal. There is no jugular venous distention. Lungs are clear. Respiratory effort is nonlabored. Cardiac exam reveals S1 and S2. There no clicks or significant murmurs. The abdomen is soft. Is no peripheral edema. There no musculoskeletal deformities. There are no skin rashes.  Filed Vitals:   03/08/11 0928  BP: 104/62  Pulse: 65  Height: 5\' 5"  (1.651 m)  Weight: 127 lb (57.607 kg)    EKG I have reviewed the EKG from her hospitalization.  ASSESSMENT & PLAN

## 2011-03-19 ENCOUNTER — Ambulatory Visit (INDEPENDENT_AMBULATORY_CARE_PROVIDER_SITE_OTHER): Payer: Medicare Other | Admitting: Internal Medicine

## 2011-03-19 DIAGNOSIS — D510 Vitamin B12 deficiency anemia due to intrinsic factor deficiency: Secondary | ICD-10-CM

## 2011-03-19 MED ORDER — CYANOCOBALAMIN 1000 MCG/ML IJ SOLN
1000.0000 ug | Freq: Once | INTRAMUSCULAR | Status: AC
  Administered 2011-03-19: 1000 ug via INTRAMUSCULAR

## 2011-04-16 ENCOUNTER — Ambulatory Visit (INDEPENDENT_AMBULATORY_CARE_PROVIDER_SITE_OTHER): Payer: Medicare Other | Admitting: Internal Medicine

## 2011-04-16 DIAGNOSIS — D510 Vitamin B12 deficiency anemia due to intrinsic factor deficiency: Secondary | ICD-10-CM

## 2011-04-16 MED ORDER — CYANOCOBALAMIN 1000 MCG/ML IJ SOLN
1000.0000 ug | Freq: Once | INTRAMUSCULAR | Status: AC
  Administered 2011-04-16: 1000 ug via INTRAMUSCULAR

## 2011-05-17 ENCOUNTER — Ambulatory Visit (INDEPENDENT_AMBULATORY_CARE_PROVIDER_SITE_OTHER): Payer: Medicare Other

## 2011-05-17 DIAGNOSIS — D510 Vitamin B12 deficiency anemia due to intrinsic factor deficiency: Secondary | ICD-10-CM

## 2011-05-17 MED ORDER — CYANOCOBALAMIN 1000 MCG/ML IJ SOLN
1000.0000 ug | Freq: Once | INTRAMUSCULAR | Status: AC
  Administered 2011-05-17: 1000 ug via INTRAMUSCULAR

## 2011-06-18 ENCOUNTER — Ambulatory Visit (INDEPENDENT_AMBULATORY_CARE_PROVIDER_SITE_OTHER): Payer: Medicare Other | Admitting: Internal Medicine

## 2011-06-18 ENCOUNTER — Encounter: Payer: Self-pay | Admitting: Internal Medicine

## 2011-06-18 VITALS — BP 90/60 | Temp 97.8°F | Wt 129.0 lb

## 2011-06-18 DIAGNOSIS — R9430 Abnormal result of cardiovascular function study, unspecified: Secondary | ICD-10-CM

## 2011-06-18 DIAGNOSIS — D510 Vitamin B12 deficiency anemia due to intrinsic factor deficiency: Secondary | ICD-10-CM

## 2011-06-18 DIAGNOSIS — I69959 Hemiplegia and hemiparesis following unspecified cerebrovascular disease affecting unspecified side: Secondary | ICD-10-CM

## 2011-06-18 DIAGNOSIS — M1990 Unspecified osteoarthritis, unspecified site: Secondary | ICD-10-CM

## 2011-06-18 DIAGNOSIS — M25519 Pain in unspecified shoulder: Secondary | ICD-10-CM

## 2011-06-18 DIAGNOSIS — I10 Essential (primary) hypertension: Secondary | ICD-10-CM

## 2011-06-18 DIAGNOSIS — R0989 Other specified symptoms and signs involving the circulatory and respiratory systems: Secondary | ICD-10-CM

## 2011-06-18 DIAGNOSIS — I2510 Atherosclerotic heart disease of native coronary artery without angina pectoris: Secondary | ICD-10-CM

## 2011-06-18 MED ORDER — ASPIRIN EC 81 MG PO TBEC: 81.0000 mg | DELAYED_RELEASE_TABLET | Freq: Every day | ORAL | Status: DC

## 2011-06-18 MED ORDER — CYANOCOBALAMIN 1000 MCG/ML IJ SOLN
1000.0000 ug | Freq: Once | INTRAMUSCULAR | Status: AC
  Administered 2011-06-18: 1000 ug via INTRAMUSCULAR

## 2011-06-18 NOTE — Patient Instructions (Signed)
Limit your sodium (Salt) intake  Take a calcium supplement, plus 800-1200 units of vitamin D    It is important that you exercise regularly, at least 20 minutes 3 to 4 times per week.  If you develop chest pain or shortness of breath seek  medical attention.  Return in 6 months for follow-up 

## 2011-06-18 NOTE — Progress Notes (Signed)
  Subjective:    Patient ID: [name], female    DOB: [birth year], 78 y.o.   MRN: [id number]  HPI  78 year old patient who is seen today for her biannual followup. She was admitted to the hospital in January of this year for atypical chest pain. Today she is doing reasonably well. Her only complaint today is her chronic left interscapular pain she also continues to have some occasional right upper quadrant pain but this does not appear to be as severe or frequent as in the past. She has a history of coronary artery disease as well as cerebrovascular disease which has been stable. She has treated hypertension and a history of pernicious anemia she is receiving monthly B12 injections. She has mild dyslipidemia presently controlled on Pravachol 20 mg daily.    Review of Systems  Constitutional: Negative.   HENT: Negative for hearing loss, congestion, sore throat, rhinorrhea, dental problem, sinus pressure and tinnitus.   Eyes: Negative for pain, discharge and visual disturbance.  Respiratory: Negative for cough and shortness of breath.   Cardiovascular: Negative for chest pain, palpitations and leg swelling.  Gastrointestinal: Positive for abdominal pain. Negative for nausea, vomiting, diarrhea, constipation, blood in stool and abdominal distention.  Genitourinary: Negative for dysuria, urgency, frequency, hematuria, flank pain, vaginal bleeding, vaginal discharge, difficulty urinating, vaginal pain and pelvic pain.  Musculoskeletal: Positive for back pain. Negative for joint swelling, arthralgias and gait problem.  Skin: Negative for rash.  Neurological: Negative for dizziness, syncope, speech difficulty, weakness, numbness and headaches.  Hematological: Negative for adenopathy.  Psychiatric/Behavioral: Negative for behavioral problems, dysphoric mood and agitation. The patient is not nervous/anxious.        Objective:   Physical Exam  Constitutional: She is oriented to person, place, and  time. She appears well-developed and well-nourished.  HENT:  Head: Normocephalic.  Right Ear: External ear normal.  Left Ear: External ear normal.  Mouth/Throat: Oropharynx is clear and moist.  Eyes: Conjunctivae and EOM are normal. Pupils are equal, round, and reactive to light.  Neck: Normal range of motion. Neck supple. No thyromegaly present.  Cardiovascular: Normal rate, regular rhythm, normal heart sounds and intact distal pulses.   Pulmonary/Chest: Effort normal and breath sounds normal.  Abdominal: Soft. Bowel sounds are normal. She exhibits no mass. There is no tenderness.  Musculoskeletal: Normal range of motion.  Lymphadenopathy:    She has no cervical adenopathy.  Neurological: She is alert and oriented to person, place, and time.  Skin: Skin is warm and dry. No rash noted.  Psychiatric: She has a normal mood and affect. Her behavior is normal.          Assessment & Plan:   Hypertension well controlled Coronary artery disease stable Cerebrovascular disease asymptomatic Pernicious anemia. Will continue monthly B12 injections  Decrease aspirin 81 mg daily Otherwise medical regimen unchanged Recheck 6 months

## 2011-07-19 ENCOUNTER — Ambulatory Visit (INDEPENDENT_AMBULATORY_CARE_PROVIDER_SITE_OTHER): Payer: Medicare Other | Admitting: Internal Medicine

## 2011-07-19 DIAGNOSIS — D510 Vitamin B12 deficiency anemia due to intrinsic factor deficiency: Secondary | ICD-10-CM

## 2011-07-19 MED ORDER — CYANOCOBALAMIN 1000 MCG/ML IJ SOLN
1000.0000 ug | Freq: Once | INTRAMUSCULAR | Status: AC
  Administered 2011-07-19: 1000 ug via INTRAMUSCULAR

## 2011-08-20 ENCOUNTER — Ambulatory Visit (INDEPENDENT_AMBULATORY_CARE_PROVIDER_SITE_OTHER): Payer: Medicare Other | Admitting: Internal Medicine

## 2011-08-20 DIAGNOSIS — D510 Vitamin B12 deficiency anemia due to intrinsic factor deficiency: Secondary | ICD-10-CM

## 2011-08-20 MED ORDER — CYANOCOBALAMIN 1000 MCG/ML IJ SOLN
1000.0000 ug | Freq: Once | INTRAMUSCULAR | Status: AC
  Administered 2011-08-20: 1000 ug via INTRAMUSCULAR

## 2011-09-04 ENCOUNTER — Telehealth: Payer: Self-pay | Admitting: Physical Medicine & Rehabilitation

## 2011-09-04 NOTE — Telephone Encounter (Signed)
There is not any record of her being on these or [place] prescribing. I called the pharmacy and let them know that.

## 2011-09-04 NOTE — Telephone Encounter (Signed)
Refill on Lidoderm patches

## 2011-09-12 ENCOUNTER — Encounter: Payer: Self-pay | Admitting: Cardiology

## 2011-09-12 ENCOUNTER — Ambulatory Visit (INDEPENDENT_AMBULATORY_CARE_PROVIDER_SITE_OTHER): Payer: Medicare Other | Admitting: Cardiology

## 2011-09-12 VITALS — BP 128/74 | HR 52 | Ht 65.0 in | Wt 126.8 lb

## 2011-09-12 DIAGNOSIS — K589 Irritable bowel syndrome without diarrhea: Secondary | ICD-10-CM | POA: Insufficient documentation

## 2011-09-12 DIAGNOSIS — I10 Essential (primary) hypertension: Secondary | ICD-10-CM

## 2011-09-12 DIAGNOSIS — R109 Unspecified abdominal pain: Secondary | ICD-10-CM | POA: Insufficient documentation

## 2011-09-12 DIAGNOSIS — I2510 Atherosclerotic heart disease of native coronary artery without angina pectoris: Secondary | ICD-10-CM | POA: Insufficient documentation

## 2011-09-12 DIAGNOSIS — R001 Bradycardia, unspecified: Secondary | ICD-10-CM | POA: Insufficient documentation

## 2011-09-12 DIAGNOSIS — Z8673 Personal history of transient ischemic attack (TIA), and cerebral infarction without residual deficits: Secondary | ICD-10-CM | POA: Insufficient documentation

## 2011-09-12 DIAGNOSIS — IMO0001 Reserved for inherently not codable concepts without codable children: Secondary | ICD-10-CM | POA: Insufficient documentation

## 2011-09-12 DIAGNOSIS — Z79899 Other long term (current) drug therapy: Secondary | ICD-10-CM | POA: Insufficient documentation

## 2011-09-12 DIAGNOSIS — R079 Chest pain, unspecified: Secondary | ICD-10-CM

## 2011-09-12 DIAGNOSIS — I498 Other specified cardiac arrhythmias: Secondary | ICD-10-CM

## 2011-09-12 DIAGNOSIS — Z9089 Acquired absence of other organs: Secondary | ICD-10-CM | POA: Insufficient documentation

## 2011-09-12 NOTE — Assessment & Plan Note (Signed)
Her chest pain appears to be musculoskeletal. Lidoderm appears to help. It may be neurologic. Because we are treating ischemia, her beta blocker can be stopped because of her bradycardia.

## 2011-09-12 NOTE — Progress Notes (Signed)
HPI Patient is seen for followup of chest pain. It has become very clear that her pain is musculoskeletal. A Lidoderm patch seems to help. Her heart rate has been slow. She's not had any syncope or presyncope.  Allergies  Allergen Reactions  . Atorvastatin Other (See Comments)    Raises liver enzymes  . Phenytoin Other (See Comments)    Blood clots  . Promethazine Hcl Other (See Comments)    Severe sedation  . Sulfacetamide Sodium     REACTION: sulfa  . Sulfamethoxazole     REACTION: unspecified    Current Outpatient Prescriptions  Medication Sig Dispense Refill  . carvedilol (COREG) 6.25 MG tablet One half tablet twice daily  180 tablet  5  . clopidogrel (PLAVIX) 75 MG tablet Take 1 tablet (75 mg total) by mouth daily.  90 tablet  6  . conjugated estrogens (PREMARIN) vaginal cream Place 1 g vaginally daily as needed.       . cyanocobalamin (COBAL-1000) 1000 MCG/ML injection Inject 1,000 mcg into the muscle every 30 (thirty) days.        Marland Kitchen L-Methylfolate-Algae-B12-B6 (METANX) 3-90.314-2-35 MG CAPS Take 1 capsule by mouth daily.       Marland Kitchen lidocaine (LIDODERM) 5 % Place 1 patch onto the skin every 12 (twelve) hours. 12 hours on and 12 hours off.      . nitroGLYCERIN (NITROSTAT) 0.4 MG SL tablet Place 0.4 mg under the tongue every 5 (five) minutes as needed.        . pravastatin (PRAVACHOL) 20 MG tablet Take 1 tablet (20 mg total) by mouth every evening.  90 tablet  11  . triamcinolone (KENALOG) 0.1 % ointment Apply 1 application topically daily as needed. Dry skin        History   Social History  . Marital Status: Widowed    Spouse Name: N/A    Number of Children: 4  . Years of Education: N/A   Occupational History  .     Social History Main Topics  . Smoking status: Never Smoker   . Smokeless tobacco: Never Used  . Alcohol Use: No  . Drug Use: No  . Sexually Active: Not on file   Other Topics Concern  . Not on file   Social History Narrative   Widowed. Lives in  [place], [place] with daughter.     Family History  Problem Relation Age of Onset  . Heart attack Brother 69    Past Medical History  Diagnosis Date  . ANEMIA, PERNICIOUS 09/10/2006  . CORONARY ARTERY DISEASE 11/07/2008  . CVA WITH LEFT HEMIPARESIS 07/22/2007    May, 2009, neurology decided aspirin and Plavix at that time.  . DERMATITIS 03/02/2007  . HYPERTENSION 07/15/2006  . MENINGIOMA 08/27/2006    Occipital meningioma, surgery, [facility], 2008 /  MRI, [place], June, 2011, no change in lesion, history believe the patient had a gamma knife treatment of a right occipital atypical meningioma  . OSTEOARTHRITIS 07/15/2006  . PULMONARY NODULE 01/14/2007    [place], December, 2011  . Takotsubo syndrome 04/22/2008    MI, May, 2008, question takotsubo event  . THYROID NODULE 01/14/2007  . IBS (irritable bowel syndrome)   . DJD (degenerative joint disease)   . Ejection fraction     EF 60%, echo, July, 2010, (  EF improved after tach a suitable event 2008)  . Subarachnoid hemorrhage following injury 2007    Subarachnoid hemorrhage secondary to a fall December, 2007    Past Surgical History  Procedure Date  . Cholecystectomy   . Abdominal hysterectomy   . Hip surgery   . Brain tumor excision     Meningioma; [facility] in 2008  . Shoulder surgery     ROS    Patient denies fever, chills, headache, sweats, rash, change in vision, change in hearing, cough, nausea vomiting, urinary symptoms. All other systems are reviewed and are negative.  PHYSICAL EXAM  Patient is here with her daughter. She is oriented to person time and place. Affect is normal. Lungs are clear. Respiratory effort is nonlabored. Cardiac exam reveals S1-S2. There no clicks or significant murmurs. The abdomen is soft. Is no peripheral edema.  Filed Vitals:   09/12/11 1132  BP: 128/74  Pulse: 52  Height: 5\' 5"  (1.651 m)  Weight: 126 lb 12.8 oz (57.516 kg)   EKG is done today and reviewed by me there is sinus  bradycardia with a rate of 52. There are no other significant changes.  ASSESSMENT & PLAN

## 2011-09-12 NOTE — Assessment & Plan Note (Signed)
The patient has bradycardia. There is no symptom. She is on small dose of carvedilol and will be stopped.

## 2011-09-12 NOTE — Assessment & Plan Note (Signed)
Blood pressure is controlled. No change in therapy. 

## 2011-09-12 NOTE — Patient Instructions (Addendum)
Your physician has recommended you make the following change in your medication: STOP your Carvedilol  Your physician wants you to follow-up in: 6 months.   You will receive a reminder letter in the mail two months in advance. If you don't receive a letter, please call our office to schedule the follow-up appointment.

## 2011-09-13 ENCOUNTER — Emergency Department (HOSPITAL_COMMUNITY): Payer: Medicare Other

## 2011-09-13 ENCOUNTER — Emergency Department (HOSPITAL_COMMUNITY)
Admission: EM | Admit: 2011-09-13 | Discharge: 2011-09-13 | Disposition: A | Discharge status: Home / Self Care | Payer: Medicare Other | Attending: Emergency Medicine | Admitting: Emergency Medicine

## 2011-09-13 ENCOUNTER — Encounter (HOSPITAL_COMMUNITY): Payer: Self-pay | Admitting: Emergency Medicine

## 2011-09-13 DIAGNOSIS — M7918 Myalgia, other site: Secondary | ICD-10-CM

## 2011-09-13 HISTORY — DX: Cerebral infarction, unspecified: I63.9

## 2011-09-13 LAB — COMPREHENSIVE METABOLIC PANEL
ALT: 11 U/L (ref 0–35)
AST: 19 U/L (ref 0–37)
Alkaline Phosphatase: 91 U/L (ref 39–117)
CO2: 28 mEq/L (ref 19–32)
Chloride: 100 mEq/L (ref 96–112)
GFR calc Af Amer: 62 mL/min — ABNORMAL LOW (ref >90)
GFR calc non Af Amer: 54 mL/min — ABNORMAL LOW (ref >90)
Glucose, Bld: 120 mg/dL — ABNORMAL HIGH (ref 70–99)
Sodium: 136 mEq/L (ref 135–145)
Total Bilirubin: 0.4 mg/dL (ref 0.3–1.2)

## 2011-09-13 LAB — URINALYSIS, ROUTINE W REFLEX MICROSCOPIC
Glucose, UA: NEGATIVE mg/dL
Hgb urine dipstick: NEGATIVE
Protein, ur: NEGATIVE mg/dL
Specific Gravity, Urine: 1.017 (ref 1.005–1.030)
Urobilinogen, UA: 0.2 mg/dL (ref 0.0–1.0)

## 2011-09-13 LAB — CBC WITH DIFFERENTIAL/PLATELET
Basophils Absolute: 0.1 10*3/uL (ref 0.0–0.1)
HCT: 34.3 % — ABNORMAL LOW (ref 36.0–46.0)
Lymphocytes Relative: 23 % (ref 12–46)
Lymphs Abs: 1.5 10*3/uL (ref 0.7–4.0)
MCV: 88.6 fL (ref 78.0–100.0)
Neutro Abs: 3.8 10*3/uL (ref 1.7–7.7)
Platelets: 207 10*3/uL (ref 150–400)
RBC: 3.87 MIL/uL (ref 3.87–5.11)
RDW: 14 % (ref 11.5–15.5)
WBC: 6.5 10*3/uL (ref 4.0–10.5)

## 2011-09-13 MED ORDER — FENTANYL CITRATE 0.05 MG/ML IJ SOLN
INTRAMUSCULAR | Status: AC
  Filled 2011-09-13: qty 2

## 2011-09-13 MED ORDER — IOHEXOL 300 MG/ML  SOLN
100.0000 mL | Freq: Once | INTRAMUSCULAR | Status: AC | PRN
  Administered 2011-09-13: 100 mL via INTRAVENOUS

## 2011-09-13 MED ORDER — FENTANYL CITRATE 0.05 MG/ML IJ SOLN
25.0000 ug | Freq: Once | INTRAMUSCULAR | Status: AC
  Administered 2011-09-13: 25 ug via INTRAVENOUS
  Filled 2011-09-13: qty 2

## 2011-09-13 MED ORDER — FENTANYL CITRATE 0.05 MG/ML IJ SOLN
25.0000 ug | Freq: Once | INTRAMUSCULAR | Status: AC
  Administered 2011-09-13: 25 ug via INTRAVENOUS

## 2011-09-13 MED ORDER — TRAMADOL HCL 50 MG PO TABS: 50.0000 mg | ORAL_TABLET | Freq: Four times a day (QID) | ORAL | Status: AC | PRN

## 2011-09-13 MED ORDER — SODIUM CHLORIDE 0.9 % IV SOLN
Freq: Once | INTRAVENOUS | Status: AC
  Administered 2011-09-13: 20 mL/h via INTRAVENOUS

## 2011-09-13 NOTE — ED Provider Notes (Signed)
Medical screening examination/treatment/procedure(s) were conducted as a shared visit with non-physician practitioner(s) and myself.  I personally evaluated the patient during the encounter. PT with left side para lumbar and thoracic pain associated with movement. No findings on exam to suggest need for further ED work up at time of evaluation.   Sunnie Nielsen, MD 09/13/11 2306

## 2011-09-13 NOTE — ED Notes (Signed)
Onset of left sided flank pain yesterday afternoon while cooking. Denies nausea or emesis. HX of UTI, no hx of kidney stones. Does not feel like UTI. Denies hematuria, dysuria, urgency or frequency+

## 2011-09-13 NOTE — ED Provider Notes (Signed)
History     CSN: [id number]  Arrival date & time 09/12/11  2355   First MD Initiated Contact with Patient 09/13/11 [phone]      Chief Complaint  Patient presents with  . Flank Pain    (Consider location/radiation/quality/duration/timing/severity/associated sxs/prior treatment) Patient is a 78 y.o. female presenting with flank pain. The history is provided by the patient.  Flank Pain This is a new problem. The current episode started yesterday. The problem occurs constantly. The problem has been gradually worsening. Pertinent negatives include no abdominal pain, chest pain, fever, nausea or vomiting. Associated symptoms comments: Left sided back and flank pain since last night. No nausea or vomiting. No change in bowel habits or dysuria. No fever. She has chronic upper back pain that is different from pain that started last night. .    Past Medical History  Diagnosis Date  . ANEMIA, PERNICIOUS 09/10/2006  . CORONARY ARTERY DISEASE 11/07/2008  . CVA WITH LEFT HEMIPARESIS 07/22/2007    May, 2009, neurology decided aspirin and Plavix at that time.  . DERMATITIS 03/02/2007  . HYPERTENSION 07/15/2006  . MENINGIOMA 08/27/2006    Occipital meningioma, surgery, [facility], 2008 /  MRI, [place], June, 2011, no change in lesion, history believe the patient had a gamma knife treatment of a right occipital atypical meningioma  . OSTEOARTHRITIS 07/15/2006  . PULMONARY NODULE 01/14/2007    [place], December, 2011  . Takotsubo syndrome 04/22/2008    MI, May, 2008, question takotsubo event  . THYROID NODULE 01/14/2007  . IBS (irritable bowel syndrome)   . DJD (degenerative joint disease)   . Ejection fraction     EF 60%, echo, July, 2010, (  EF improved after tach a suitable event 2008)  . Subarachnoid hemorrhage following injury 2007    Subarachnoid hemorrhage secondary to a fall December, 2007  . Bradycardia     August, 2013  . Stroke     Past Surgical History  Procedure Date  . Cholecystectomy   .  Abdominal hysterectomy   . Hip surgery   . Brain tumor excision     Meningioma; treated at [facility] in 2008  . Shoulder surgery   . Joint replacement     rt THR    Family History  Problem Relation Age of Onset  . Heart attack Brother 16    History  Substance Use Topics  . Smoking status: Never Smoker   . Smokeless tobacco: Never Used  . Alcohol Use: No    OB History    Grav Para Term Preterm Abortions TAB SAB Ect Mult Living                  Review of Systems  Constitutional: Negative for fever.  Respiratory: Negative for shortness of breath.   Cardiovascular: Negative for chest pain.  Gastrointestinal: Negative for nausea, vomiting and abdominal pain.  Genitourinary: Positive for flank pain. Negative for dysuria, frequency and difficulty urinating.  Musculoskeletal: Positive for back pain.    Allergies  Atorvastatin; Phenytoin; Promethazine hcl; Sulfacetamide sodium; and Sulfamethoxazole  Home Medications   Current Outpatient Rx  Name Route Sig Dispense Refill  . CARVEDILOL 3.125 MG PO TABS Oral Take 3.125 mg by mouth 2 (two) times daily with a meal.    . CLOPIDOGREL BISULFATE 75 MG PO TABS Oral Take 1 tablet (75 mg total) by mouth daily. 90 tablet 6  . CYANOCOBALAMIN 1000 MCG/ML IJ SOLN Intramuscular Inject 1,000 mcg into the muscle every 30 (thirty) days.      Marland Kitchen  METANX 3-90.314-2-35 MG PO CAPS Oral Take 1 capsule by mouth daily.     Marland Kitchen LIDOCAINE 5 % EX PTCH Transdermal Place 1 patch onto the skin every 12 (twelve) hours. 12 hours on and 12 hours off.    Marland Kitchen PRAVASTATIN SODIUM 20 MG PO TABS Oral Take 1 tablet (20 mg total) by mouth every evening. 90 tablet 11  . TRIAMCINOLONE ACETONIDE 0.1 % EX OINT Topical Apply 1 application topically daily as needed. Dry skin    . ESTROGENS, CONJUGATED 0.625 MG/GM VA CREA Vaginal Place 1 g vaginally daily as needed.     Marland Kitchen NITROGLYCERIN 0.4 MG SL SUBL Sublingual Place 0.4 mg under the tongue every 5 (five) minutes as needed.         BP 115/58  Pulse 55  Temp 97.8 F (36.6 C) (Oral)  SpO2 98%  Physical Exam  Constitutional: She is oriented to person, place, and time. She appears well-developed and well-nourished.       Uncomfortable appearing.  Neck: Normal range of motion.  Cardiovascular: Normal rate and regular rhythm.   No murmur heard. Pulmonary/Chest: Effort normal. She has no wheezes. She has no rales. She exhibits no tenderness.  Abdominal: Soft. There is no tenderness. There is no rebound.  Musculoskeletal: She exhibits no edema.       Left para-thoracic tenderness that extends to flank.  No swelling.  Neurological: She is alert and oriented to person, place, and time.  Skin: Skin is warm and dry.    ED Course  Procedures (including critical care time)  Labs Reviewed  URINALYSIS, ROUTINE W REFLEX MICROSCOPIC - Abnormal; Notable for the following:    Leukocytes, UA MODERATE (*)     All other components within normal limits  CBC WITH DIFFERENTIAL - Abnormal; Notable for the following:    Hemoglobin 11.3 (*)     HCT 34.3 (*)     All other components within normal limits  COMPREHENSIVE METABOLIC PANEL - Abnormal; Notable for the following:    Glucose, Bld 120 (*)     GFR calc non Af Amer 54 (*)     GFR calc Af Amer 62 (*)     All other components within normal limits  URINE MICROSCOPIC-ADD ON   No results found.  Results for orders placed during the hospital encounter of 09/13/11  URINALYSIS, ROUTINE W REFLEX MICROSCOPIC      Component Value Range   Color, Urine YELLOW  YELLOW   APPearance CLEAR  CLEAR   Specific Gravity, Urine 1.017  1.005 - 1.030   pH 5.5  5.0 - 8.0   Glucose, UA NEGATIVE  NEGATIVE mg/dL   Hgb urine dipstick NEGATIVE  NEGATIVE   Bilirubin Urine NEGATIVE  NEGATIVE   Ketones, ur NEGATIVE  NEGATIVE mg/dL   Protein, ur NEGATIVE  NEGATIVE mg/dL   Urobilinogen, UA 0.2  0.0 - 1.0 mg/dL   Nitrite NEGATIVE  NEGATIVE   Leukocytes, UA MODERATE (*) NEGATIVE  CBC WITH  DIFFERENTIAL      Component Value Range   WBC 6.5  4.0 - 10.5 K/uL   RBC 3.87  3.87 - 5.11 MIL/uL   Hemoglobin 11.3 (*) 12.0 - 15.0 g/dL   HCT 16.1 (*) 09.6 - 04.5 %   MCV 88.6  78.0 - 100.0 fL   MCH 29.2  26.0 - 34.0 pg   MCHC 32.9  30.0 - 36.0 g/dL   RDW 40.9  81.1 - 91.4 %   Platelets 207  150 -  400 K/uL   Neutrophils Relative 59  43 - 77 %   Neutro Abs 3.8  1.7 - 7.7 K/uL   Lymphocytes Relative 23  12 - 46 %   Lymphs Abs 1.5  0.7 - 4.0 K/uL   Monocytes Relative 11  3 - 12 %   Monocytes Absolute 0.7  0.1 - 1.0 K/uL   Eosinophils Relative 5  0 - 5 %   Eosinophils Absolute 0.3  0.0 - 0.7 K/uL   Basophils Relative 1  0 - 1 %   Basophils Absolute 0.1  0.0 - 0.1 K/uL  COMPREHENSIVE METABOLIC PANEL      Component Value Range   Sodium 136  135 - 145 mEq/L   Potassium 3.9  3.5 - 5.1 mEq/L   Chloride 100  96 - 112 mEq/L   CO2 28  19 - 32 mEq/L   Glucose, Bld 120 (*) 70 - 99 mg/dL   BUN 21  6 - 23 mg/dL   Creatinine, Ser 1.61  0.50 - 1.10 mg/dL   Calcium 9.6  8.4 - 09.6 mg/dL   Total Protein 6.7  6.0 - 8.3 g/dL   Albumin 3.9  3.5 - 5.2 g/dL   AST 19  0 - 37 U/L   ALT 11  0 - 35 U/L   Alkaline Phosphatase 91  39 - 117 U/L   Total Bilirubin 0.4  0.3 - 1.2 mg/dL   GFR calc non Af Amer 54 (*) >90 mL/min   GFR calc Af Amer 62 (*) >90 mL/min  URINE MICROSCOPIC-ADD ON      Component Value Range   Squamous Epithelial / LPF RARE  RARE   WBC, UA 3-6  <3 WBC/hpf   Ct Abdomen Pelvis W Contrast  09/13/2011  *RADIOLOGY REPORT*  Clinical Data: Left flank pain.  Hysterectomy and cholecystectomy history.  White blood cells in the urine.  CT ABDOMEN AND PELVIS WITH CONTRAST  Technique:  Multidetector CT imaging of the abdomen and pelvis was performed following the standard protocol during bolus administration of intravenous contrast.  Contrast: OMNIPAQUE IOHEXOL 300 MG/ML  SOLN  Comparison: 06/22/2006.  Findings: Lung Bases: Dependent atelectasis.  Liver:  Mild postcholecystectomy  intrahepatic biliary ductal dilation.  Scattered sub centimeter low density lesions, likely representing cysts.  No mass lesions.  Spleen:  Normal.  Gallbladder:  Surgically absent.  Common bile duct:  Post cholecystectomy dilation.  Pancreas:  There are no cystic lesions in the tail of the pancreas (image number 20 series 2), suggesting focal ductal dilation. Intraductal papillary mucinous neoplasm cannot be excluded.  Follow up pancreatic protocol MRI is recommended for further assessment. These lesions are new compared to 06/20/2006 CT.  Prior MRCP showed normal caliber of the pancreatic duct.  Adrenal glands:  Normal.  Kidneys:  Bilateral renal cortical atrophy.  Normal enhancement and excretion.  Stomach:  Normal.  Small bowel:  Normal.  No mesenteric adenopathy.  Colon:   Mild nonspecific thickening of the ascending colon, just distal to the cecum (image 41 series 2, image 36 series 5). Severe colonic diverticulosis of the sigmoid and descending colon.  Pelvic Genitourinary:  Hysterectomy.  Urinary bladder distended. Artifact in the anatomic pelvis from right total hip arthroplasty.  Bones:  Osteopenia.  Multilevel lumbar spondylosis.  T12 vertebral body hemangioma occupying the entire vertebral body.  Vasculature: Atherosclerosis.  No aneurysm.  IMPRESSION:  1.  No acute abnormality. 2.  Tiny hepatic lesions are too small to characterize, likely representing cysts.  3.  Cholecystectomy.  The intra and extrahepatic postcholecystectomy dilation of the biliary system. 4.  New focal dilation of the pancreatic duct in the tail of the pancreas.  Followup MRI of the pancreas with MRCP recommended if it will affect management in this 78 year old. Non-emergent MRI should be deferred until patient has been discharged for the acute illness, and can optimally cooperate with positioning and breath- holding instructions.  Original Report Authenticated By: Andreas Newport, M.D.   No diagnosis found.  1. Musculoskeletal  back pain  MDM  Dr. Theodoro Kalata in to evaluate patient. No fever, reproducible pain, normal urine without hematuria, CT without evidence of stones - all support muscular origin of pain. The patient is known ot have chronic back pain and feel this is worsening or an exacerbation of usual pain. Discharge home with pain medication and follow upwith PCP.        Rodena Medin, PA-C 09/13/11 1001

## 2011-09-13 NOTE — ED Notes (Signed)
Patient is alert and oriented x3.  She was given DC instructions and follow up visit instructions.  Patient gave verbal understanding. She was DC via wheelchair to home.  V/S stable.  He was not showing any signs of distress on DC 

## 2011-09-17 ENCOUNTER — Encounter: Payer: Self-pay | Admitting: Family

## 2011-09-17 ENCOUNTER — Ambulatory Visit (INDEPENDENT_AMBULATORY_CARE_PROVIDER_SITE_OTHER): Payer: Medicare Other | Admitting: Family

## 2011-09-17 VITALS — BP 140/72 | HR 78 | Temp 98.0°F | Wt 123.0 lb

## 2011-09-17 DIAGNOSIS — M545 Low back pain, unspecified: Secondary | ICD-10-CM

## 2011-09-17 DIAGNOSIS — R350 Frequency of micturition: Secondary | ICD-10-CM

## 2011-09-17 DIAGNOSIS — K869 Disease of pancreas, unspecified: Secondary | ICD-10-CM

## 2011-09-17 MED ORDER — NITROFURANTOIN MONOHYD MACRO 100 MG PO CAPS: 100.0000 mg | ORAL_CAPSULE | Freq: Two times a day (BID) | ORAL | Status: DC

## 2011-09-18 ENCOUNTER — Other Ambulatory Visit: Payer: Self-pay | Admitting: Family Medicine

## 2011-09-18 ENCOUNTER — Encounter: Payer: Self-pay | Admitting: Family

## 2011-09-18 DIAGNOSIS — M545 Low back pain, unspecified: Secondary | ICD-10-CM

## 2011-09-18 DIAGNOSIS — K869 Disease of pancreas, unspecified: Secondary | ICD-10-CM

## 2011-09-18 DIAGNOSIS — R350 Frequency of micturition: Secondary | ICD-10-CM

## 2011-09-18 NOTE — Progress Notes (Signed)
Subjective:    Patient ID: [name], female    DOB: September 28, 1922, 78 y.o.   MRN: [id number]  HPI  is an 78 year old white female, nonsmoker, patient of Dr. Kirtland Bouchard. is in for emergency department followup. She was seen in the emergency department with complaints of low back pain that was found to be musculoskeletal in nature. She had a CT scan of the abdomen and pelvis performed that showed a pancreatic lesion, liver cyst, some thickening in the bowel , and diverticulosis. Her pain today is no better. She continues to have urinary frequency, urgency but denies any blood in her stools. She had a UA done at the hospital does show moderate leukocytes. She denies any vaginal discharge. Concerns any sexually transmitted diseases.   Review of Systems  Constitutional: Negative.   HENT: Negative.   Respiratory: Negative.   Cardiovascular: Negative.   Gastrointestinal: Negative.   Genitourinary: Positive for dysuria, urgency and frequency.  Musculoskeletal: Positive for back pain.  Hematological: Negative.   Psychiatric/Behavioral: Negative.    Past Medical History  Diagnosis Date  . ANEMIA, PERNICIOUS 09/10/2006  . CORONARY ARTERY DISEASE 11/07/2008  . CVA WITH LEFT HEMIPARESIS 07/22/2007    May, 2009, neurology decided aspirin and Plavix at that time.  . DERMATITIS 03/02/2007  . HYPERTENSION 07/15/2006  . MENINGIOMA 08/27/2006    Occipital meningioma, surgery, [facility], 2008 /  MRI, [place], June, 2011, no change in lesion, history believe the patient had a gamma knife treatment of a right occipital atypical meningioma  . OSTEOARTHRITIS 07/15/2006  . PULMONARY NODULE 01/14/2007    [place], December, 2011  . Takotsubo syndrome 04/22/2008    MI, May, 2008, question takotsubo event  . THYROID NODULE 01/14/2007  . IBS (irritable bowel syndrome)   . DJD (degenerative joint disease)   . Ejection fraction     EF 60%, echo, July, 2010, (  EF improved after tach a suitable event 2008)  . Subarachnoid  hemorrhage following injury 2007    Subarachnoid hemorrhage secondary to a fall December, 2007  . Bradycardia     August, 2013  . Stroke     History   Social History  . Marital Status: Widowed    Spouse Name: N/A    Number of Children: 4  . Years of Education: N/A   Occupational History  .     Social History Main Topics  . Smoking status: Never Smoker   . Smokeless tobacco: Never Used  . Alcohol Use: No  . Drug Use: No  . Sexually Active: Not on file   Other Topics Concern  . Not on file   Social History Narrative   Widowed. Lives in [place], [place] with daughter.     Past Surgical History  Procedure Date  . Cholecystectomy   . Abdominal hysterectomy   . Hip surgery   . Brain tumor excision     Meningioma; treated at [facility] in 2008  . Shoulder surgery   . Joint replacement     rt THR    Family History  Problem Relation Age of Onset  . Heart attack Brother 84    Allergies  Allergen Reactions  . Atorvastatin Other (See Comments)    Raises liver enzymes  . Phenytoin Other (See Comments)    Blood clots  . Promethazine Hcl Other (See Comments)    Severe sedation  . Sulfacetamide Sodium     REACTION: sulfa  . Sulfamethoxazole     REACTION: unspecified    Current Outpatient Prescriptions  on File Prior to Visit  Medication Sig Dispense Refill  . carvedilol (COREG) 3.125 MG tablet Take 3.125 mg by mouth 2 (two) times daily with a meal.      . clopidogrel (PLAVIX) 75 MG tablet Take 1 tablet (75 mg total) by mouth daily.  90 tablet  6  . conjugated estrogens (PREMARIN) vaginal cream Place 1 g vaginally daily as needed.       . cyanocobalamin (COBAL-1000) 1000 MCG/ML injection Inject 1,000 mcg into the muscle every 30 (thirty) days.        Marland Kitchen L-Methylfolate-Algae-B12-B6 (METANX) 3-90.314-2-35 MG CAPS Take 1 capsule by mouth daily.       Marland Kitchen lidocaine (LIDODERM) 5 % Place 1 patch onto the skin every 12 (twelve) hours. 12 hours on and 12 hours off.      .  nitroGLYCERIN (NITROSTAT) 0.4 MG SL tablet Place 0.4 mg under the tongue every 5 (five) minutes as needed.        . pravastatin (PRAVACHOL) 20 MG tablet Take 1 tablet (20 mg total) by mouth every evening.  90 tablet  11  . traMADol (ULTRAM) 50 MG tablet Take 1 tablet (50 mg total) by mouth every 6 (six) hours as needed for pain.  15 tablet  0  . triamcinolone (KENALOG) 0.1 % ointment Apply 1 application topically daily as needed. Dry skin        BP 140/72  Pulse 78  Temp 98 F (36.7 C) (Oral)  Wt 123 lb (55.792 kg)  SpO2 97%chart    Objective:   Physical Exam  Constitutional: She is oriented to person, place, and time. She appears well-developed and well-nourished.  Neck: Normal range of motion. Neck supple.  Cardiovascular: Normal rate, regular rhythm and normal heart sounds.   Pulmonary/Chest: Breath sounds normal.  Abdominal: Soft.  Musculoskeletal: Normal range of motion.  Neurological: She is alert and oriented to person, place, and time.  Skin: Skin is warm and dry.  Psychiatric: She has a normal mood and affect.     CT scan of the abdomen and pelvis done at the hospital 1. No acute abnormality. 2. Tiny hepatic lesions are too small to characterize, likely representing cysts. 3. Cholecystectomy. The intra and extrahepatic postcholecystectomy dilation of the biliary system. 4. New focal dilation of the pancreatic duct in the tail of the pancreas. Followup MRI of the pancreas with MRCP recommended if it will affect management in this 78 year old. Non-emergent MRI should be deferred until patient has been discharged for the acute illness, and can optimally cooperate with positioning and breath- holding instructions.      Assessment & Plan:  Assessment: Lesion of the liver, lesion of the pancreas, urinary frequency, urinary tract infection   Plan: MRI of the abdomen to evaluate the pancreatic lesion as suggested by radiology. Will notify patient pending results. We'll  treat with Cipro 250 mg twice a day x7 days to hopefully resolve the bacteria in her urine. Patient call the office if symptoms worsen or persist. Recheck a schedule, and when necessary.

## 2011-09-19 ENCOUNTER — Telehealth: Payer: Self-pay | Admitting: Internal Medicine

## 2011-09-19 MED ORDER — NITROFURANTOIN MONOHYD MACRO 100 MG PO CAPS: 100.0000 mg | ORAL_CAPSULE | Freq: Two times a day (BID) | ORAL | Status: AC

## 2011-09-19 NOTE — Telephone Encounter (Signed)
Sent to [facility] on [address] by e-scribe.

## 2011-09-19 NOTE — Telephone Encounter (Signed)
The rx was macrobid.

## 2011-09-19 NOTE — Telephone Encounter (Signed)
Patient's daughter called stating that her mom's rx should have been called into [facility] on [address] and it was called into [facility] order pharmacy. Please assist.

## 2011-09-20 ENCOUNTER — Ambulatory Visit: Payer: Medicare Other

## 2011-09-24 ENCOUNTER — Ambulatory Visit
Admission: RE | Admit: 2011-09-24 | Discharge: 2011-09-24 | Disposition: A | Discharge status: Home / Self Care | Payer: Medicare Other | Source: Ambulatory Visit | Attending: Family | Admitting: Family

## 2011-09-24 DIAGNOSIS — M545 Low back pain, unspecified: Secondary | ICD-10-CM

## 2011-09-24 DIAGNOSIS — R350 Frequency of micturition: Secondary | ICD-10-CM

## 2011-09-24 DIAGNOSIS — K869 Disease of pancreas, unspecified: Secondary | ICD-10-CM

## 2011-09-24 MED ORDER — GADOBENATE DIMEGLUMINE 529 MG/ML IV SOLN
11.0000 mL | Freq: Once | INTRAVENOUS | Status: AC | PRN
  Administered 2011-09-24: 11 mL via INTRAVENOUS

## 2011-09-28 ENCOUNTER — Ambulatory Visit (INDEPENDENT_AMBULATORY_CARE_PROVIDER_SITE_OTHER): Payer: Medicare Other | Admitting: Family Medicine

## 2011-09-28 ENCOUNTER — Encounter: Payer: Self-pay | Admitting: Family Medicine

## 2011-09-28 VITALS — BP 134/76 | HR 55 | Temp 97.5°F | Wt 121.0 lb

## 2011-09-28 DIAGNOSIS — R339 Retention of urine, unspecified: Secondary | ICD-10-CM

## 2011-09-28 MED ORDER — BETHANECHOL CHLORIDE 10 MG PO TABS: ORAL_TABLET | ORAL | Status: DC

## 2011-09-28 NOTE — Progress Notes (Signed)
OFFICE NOTE  09/28/2011  CC:  Chief Complaint  Patient presents with  . diffculty urinating, scanty urine was d/c recently from hosp  . Fatigue    weakness per pt     HPI: Patient is a 78 y.o. Caucasian female who is here for difficulty urinating. Onset 4 days ago after getting MRI abd/pelv with IV contrast (09/24/11) for some other abdominal complaints. She has hx of urinary retention after getting MRI contrast in the past but it has cleared up in 1-2 days in the past. Denies dysuria.  Has been taking macrobid for the last 8d.   Some nausea but no vomiting.  No fevers.  Bowels are moving fine.  Has not taken tramadol in >7d until this morning she took some for her shoulder.     Pertinent PMH:  Past Medical History  Diagnosis Date  . ANEMIA, PERNICIOUS 09/10/2006  . CORONARY ARTERY DISEASE 11/07/2008  . CVA WITH LEFT HEMIPARESIS 07/22/2007    May, 2009, neurology decided aspirin and Plavix at that time.  . DERMATITIS 03/02/2007  . HYPERTENSION 07/15/2006  . MENINGIOMA 08/27/2006    Occipital meningioma, surgery, [facility], 2008 /  MRI, [place], June, 2011, no change in lesion, history believe the patient had a gamma knife treatment of a right occipital atypical meningioma  . OSTEOARTHRITIS 07/15/2006  . PULMONARY NODULE 01/14/2007    [place], December, 2011  . Takotsubo syndrome 04/22/2008    MI, May, 2008, question takotsubo event  . THYROID NODULE 01/14/2007  . IBS (irritable bowel syndrome)   . DJD (degenerative joint disease)   . Ejection fraction     EF 60%, echo, July, 2010, (  EF improved after tach a suitable event 2008)  . Subarachnoid hemorrhage following injury 2007    Subarachnoid hemorrhage secondary to a fall December, 2007  . Bradycardia     August, 2013  . Stroke     MEDS:  Outpatient Prescriptions Prior to Visit  Medication Sig Dispense Refill  . carvedilol (COREG) 3.125 MG tablet Take 3.125 mg by mouth 2 (two) times daily with a meal.      . clopidogrel  (PLAVIX) 75 MG tablet Take 1 tablet (75 mg total) by mouth daily.  90 tablet  6  . conjugated estrogens (PREMARIN) vaginal cream Place 1 g vaginally daily as needed.       . cyanocobalamin (COBAL-1000) 1000 MCG/ML injection Inject 1,000 mcg into the muscle every 30 (thirty) days.        Marland Kitchen L-Methylfolate-Algae-B12-B6 (METANX) 3-90.314-2-35 MG CAPS Take 1 capsule by mouth daily.       . nitrofurantoin, macrocrystal-monohydrate, (MACROBID) 100 MG capsule Take 1 capsule (100 mg total) by mouth 2 (two) times daily.  14 capsule  0  . nitroGLYCERIN (NITROSTAT) 0.4 MG SL tablet Place 0.4 mg under the tongue every 5 (five) minutes as needed.        . triamcinolone (KENALOG) 0.1 % ointment Apply 1 application topically daily as needed. Dry skin      . lidocaine (LIDODERM) 5 % Place 1 patch onto the skin every 12 (twelve) hours. 12 hours on and 12 hours off.      . pravastatin (PRAVACHOL) 20 MG tablet Take 1 tablet (20 mg total) by mouth every evening.  90 tablet  11    PE: Blood pressure 134/76, pulse 55, temperature 97.5 F (36.4 C), temperature source Oral, weight 121 lb (54.885 kg), SpO2 98.00%. Gen: Alert, well appearing.  Patient is oriented to person, place,  time, and situation. CV: RRR, no m/r/g.   LUNGS: CTA bilat, nonlabored resps, good aeration in all lung fields. ABD: soft, suprapubic fullness and moderate TTP.  No guarding or rebound.  IMPRESSION AND PLAN:  Urinary retention, ? Related to recent MRI contrast. Continue current antibiotics. Offered cath insertion today to empty bladder but she preferred a trial of med first: will Send bethanacol 10mg , 1-2 tabs po qid prn.  FOLLOW UP: prn

## 2011-09-30 NOTE — Progress Notes (Signed)
Quick Note:  Called and spoke with pt and pt is aware. ______ 

## 2011-10-02 ENCOUNTER — Other Ambulatory Visit: Payer: Self-pay | Admitting: Internal Medicine

## 2011-10-03 ENCOUNTER — Ambulatory Visit (INDEPENDENT_AMBULATORY_CARE_PROVIDER_SITE_OTHER): Payer: Medicare Other | Admitting: Internal Medicine

## 2011-10-03 ENCOUNTER — Encounter: Payer: Self-pay | Admitting: Internal Medicine

## 2011-10-03 VITALS — BP 90/60 | Temp 97.7°F | Wt 122.0 lb

## 2011-10-03 DIAGNOSIS — R5381 Other malaise: Secondary | ICD-10-CM

## 2011-10-03 DIAGNOSIS — D510 Vitamin B12 deficiency anemia due to intrinsic factor deficiency: Secondary | ICD-10-CM

## 2011-10-03 DIAGNOSIS — L259 Unspecified contact dermatitis, unspecified cause: Secondary | ICD-10-CM

## 2011-10-03 DIAGNOSIS — E785 Hyperlipidemia, unspecified: Secondary | ICD-10-CM

## 2011-10-03 DIAGNOSIS — R5383 Other fatigue: Secondary | ICD-10-CM

## 2011-10-03 DIAGNOSIS — E041 Nontoxic single thyroid nodule: Secondary | ICD-10-CM

## 2011-10-03 DIAGNOSIS — I10 Essential (primary) hypertension: Secondary | ICD-10-CM

## 2011-10-03 DIAGNOSIS — M1990 Unspecified osteoarthritis, unspecified site: Secondary | ICD-10-CM

## 2011-10-03 DIAGNOSIS — R079 Chest pain, unspecified: Secondary | ICD-10-CM

## 2011-10-03 LAB — TSH: TSH: 0.22 u[IU]/mL — ABNORMAL LOW (ref 0.35–5.50)

## 2011-10-03 LAB — SEDIMENTATION RATE: Sed Rate: 10 mm/hr (ref 0–22)

## 2011-10-03 MED ORDER — BETHANECHOL CHLORIDE 10 MG PO TABS: ORAL_TABLET | ORAL | Status: DC

## 2011-10-03 MED ORDER — TRIAMCINOLONE ACETONIDE 0.1 % EX OINT: 1.0000 "application " | TOPICAL_OINTMENT | Freq: Every day | CUTANEOUS | Status: DC | PRN

## 2011-10-03 MED ORDER — CYANOCOBALAMIN 1000 MCG/ML IJ SOLN
1000.0000 ug | Freq: Once | INTRAMUSCULAR | Status: AC
  Administered 2011-10-03: 1000 ug via INTRAMUSCULAR

## 2011-10-03 NOTE — Patient Instructions (Signed)
Return in one month for follow-up  

## 2011-10-03 NOTE — Progress Notes (Signed)
Subjective:    Patient ID: [name], female    DOB: 01-05-1923, 78 y.o.   MRN: [id number]  HPI  78 year old patient who is seen today for followup. She has a history of hypertension but Coreg has been placed on hold due to bradycardia and low blood pressure readings. More recently she has had worsening of fatigue poor appetite and some associated documented weight loss. She had a recent abdominal CT scan followed by an abdominal MRI that revealed a new nonspecific lesion in the tail of the pancreas. 1 year followup recommended. This lesion appeared to be cystic however. She does have a history of cerebrovascular disease and remains on Plavix.  Past Medical History  Diagnosis Date  . ANEMIA, PERNICIOUS 09/10/2006  . CORONARY ARTERY DISEASE 11/07/2008  . CVA WITH LEFT HEMIPARESIS 07/22/2007    May, 2009, neurology decided aspirin and Plavix at that time.  . DERMATITIS 03/02/2007  . HYPERTENSION 07/15/2006  . MENINGIOMA 08/27/2006    Occipital meningioma, surgery, [facility], 2008 /  MRI, [place], June, 2011, no change in lesion, history believe the patient had a gamma knife treatment of a right occipital atypical meningioma  . OSTEOARTHRITIS 07/15/2006  . PULMONARY NODULE 01/14/2007    [place], December, 2011  . Takotsubo syndrome 04/22/2008    MI, May, 2008, question takotsubo event  . THYROID NODULE 01/14/2007  . IBS (irritable bowel syndrome)   . DJD (degenerative joint disease)   . Ejection fraction     EF 60%, echo, July, 2010, (  EF improved after tach a suitable event 2008)  . Subarachnoid hemorrhage following injury 2007    Subarachnoid hemorrhage secondary to a fall December, 2007  . Bradycardia     August, 2013  . Stroke     History   Social History  . Marital Status: Widowed    Spouse Name: N/A    Number of Children: 4  . Years of Education: N/A   Occupational History  .     Social History Main Topics  . Smoking status: Never Smoker   . Smokeless tobacco: Never Used    . Alcohol Use: No  . Drug Use: No  . Sexually Active: Not on file   Other Topics Concern  . Not on file   Social History Narrative   Widowed. Lives in [place], [place] with daughter.     Past Surgical History  Procedure Date  . Cholecystectomy   . Abdominal hysterectomy   . Hip surgery   . Brain tumor excision     Meningioma; treated at [facility] in 2008  . Shoulder surgery   . Joint replacement     rt THR    Family History  Problem Relation Age of Onset  . Heart attack Brother 84    Allergies  Allergen Reactions  . Atorvastatin Other (See Comments)    Raises liver enzymes  . Phenytoin Other (See Comments)    Blood clots  . Promethazine Hcl Other (See Comments)    Severe sedation  . Sulfacetamide Sodium     REACTION: sulfa  . Sulfamethoxazole     REACTION: unspecified    Current Outpatient Prescriptions on File Prior to Visit  Medication Sig Dispense Refill  . carvedilol (COREG) 3.125 MG tablet Take 3.125 mg by mouth 2 (two) times daily with a meal.      . carvedilol (COREG) 6.25 MG tablet TAKE ONE-HALF (1/2) TABLET TWICE A DAY  90 tablet  2  . clopidogrel (PLAVIX) 75 MG tablet Take  1 tablet (75 mg total) by mouth daily.  90 tablet  6  . conjugated estrogens (PREMARIN) vaginal cream Place 1 g vaginally daily as needed.       . cyanocobalamin (COBAL-1000) 1000 MCG/ML injection Inject 1,000 mcg into the muscle every 30 (thirty) days.        Marland Kitchen L-Methylfolate-Algae-B12-B6 (METANX) 3-90.314-2-35 MG CAPS Take 1 capsule by mouth daily.       Marland Kitchen lidocaine (LIDODERM) 5 % Place 1 patch onto the skin every 12 (twelve) hours. 12 hours on and 12 hours off.      . nitroGLYCERIN (NITROSTAT) 0.4 MG SL tablet Place 0.4 mg under the tongue every 5 (five) minutes as needed.        . traMADol (ULTRAM) 50 MG tablet       . DISCONTD: bethanechol (URECHOLINE) 10 MG tablet 1-2 tabs po qid prn urinary retention  40 tablet  0  . DISCONTD: pravastatin (PRAVACHOL) 20 MG tablet Take 1 tablet  (20 mg total) by mouth every evening.  90 tablet  11   No current facility-administered medications on file prior to visit.    BP 90/60  Temp 97.7 F (36.5 C) (Oral)  Wt 122 lb (55.339 kg)     Wt Readings from Last 3 Encounters:  10/03/11 122 lb (55.339 kg)  09/28/11 121 lb (54.885 kg)  09/17/11 123 lb (55.792 kg)    Review of Systems  Constitutional: Positive for appetite change, fatigue and unexpected weight change.  HENT: Negative for hearing loss, congestion, sore throat, rhinorrhea, dental problem, sinus pressure and tinnitus.   Eyes: Negative for pain, discharge and visual disturbance.  Respiratory: Negative for cough and shortness of breath.   Cardiovascular: Positive for chest pain. Negative for palpitations and leg swelling.  Gastrointestinal: Negative for nausea, vomiting, abdominal pain, diarrhea, constipation, blood in stool and abdominal distention.  Genitourinary: Negative for dysuria, urgency, frequency, hematuria, flank pain, vaginal bleeding, vaginal discharge, difficulty urinating, vaginal pain and pelvic pain.  Musculoskeletal: Negative for joint swelling, arthralgias and gait problem.  Skin: Negative for rash.  Neurological: Negative for dizziness, syncope, speech difficulty, weakness, numbness and headaches.  Hematological: Negative for adenopathy.  Psychiatric/Behavioral: Negative for behavioral problems, dysphoric mood and agitation. The patient is not nervous/anxious.        Objective:   Physical Exam  Constitutional: She is oriented to person, place, and time. She appears well-developed and well-nourished.  HENT:  Head: Normocephalic.  Right Ear: External ear normal.  Left Ear: External ear normal.  Mouth/Throat: Oropharynx is clear and moist.  Eyes: Conjunctivae and EOM are normal. Pupils are equal, round, and reactive to light.  Neck: Normal range of motion. Neck supple. No thyromegaly present.  Cardiovascular: Normal rate, regular rhythm,  normal heart sounds and intact distal pulses.   Pulmonary/Chest: Effort normal and breath sounds normal. She exhibits tenderness.  Abdominal: Soft. Bowel sounds are normal. She exhibits no mass. There is no tenderness.  Musculoskeletal: Normal range of motion.  Lymphadenopathy:    She has no cervical adenopathy.  Neurological: She is alert and oriented to person, place, and time.  Skin: Skin is warm and dry. No rash noted.  Psychiatric: She has a normal mood and affect. Her behavior is normal.          Assessment & Plan:   Fatigue. We'll check a sedimentation rate and TSH. A laboratory study has been checked recently and unremarkable. We'll followup in one month Abnormal abdominal CT/MRI. We'll consider followup in  one year as recommended per radiology guidelines Hypertension. Presently low normal off medication. We'll continue to hold Coreg  Recheck 1 month Liberal calorie intake encouraged;

## 2011-10-14 ENCOUNTER — Emergency Department (HOSPITAL_COMMUNITY): Payer: Medicare Other

## 2011-10-14 ENCOUNTER — Inpatient Hospital Stay (HOSPITAL_COMMUNITY): Payer: Medicare Other

## 2011-10-14 ENCOUNTER — Observation Stay (HOSPITAL_COMMUNITY)
Admission: EM | Admit: 2011-10-14 | Discharge: 2011-10-15 | Disposition: A | Discharge status: Home / Self Care | Payer: Medicare Other | Attending: Internal Medicine | Admitting: Internal Medicine

## 2011-10-14 ENCOUNTER — Encounter (HOSPITAL_COMMUNITY): Payer: Self-pay | Admitting: General Practice

## 2011-10-14 DIAGNOSIS — N390 Urinary tract infection, site not specified: Secondary | ICD-10-CM | POA: Diagnosis present

## 2011-10-14 DIAGNOSIS — R001 Bradycardia, unspecified: Secondary | ICD-10-CM

## 2011-10-14 DIAGNOSIS — D510 Vitamin B12 deficiency anemia due to intrinsic factor deficiency: Secondary | ICD-10-CM | POA: Insufficient documentation

## 2011-10-14 DIAGNOSIS — I2510 Atherosclerotic heart disease of native coronary artery without angina pectoris: Secondary | ICD-10-CM | POA: Insufficient documentation

## 2011-10-14 DIAGNOSIS — E785 Hyperlipidemia, unspecified: Secondary | ICD-10-CM | POA: Insufficient documentation

## 2011-10-14 DIAGNOSIS — I498 Other specified cardiac arrhythmias: Secondary | ICD-10-CM

## 2011-10-14 DIAGNOSIS — I5181 Takotsubo syndrome: Secondary | ICD-10-CM | POA: Diagnosis present

## 2011-10-14 DIAGNOSIS — R42 Dizziness and giddiness: Principal | ICD-10-CM

## 2011-10-14 DIAGNOSIS — I10 Essential (primary) hypertension: Secondary | ICD-10-CM | POA: Insufficient documentation

## 2011-10-14 DIAGNOSIS — E0590 Thyrotoxicosis, unspecified without thyrotoxic crisis or storm: Secondary | ICD-10-CM

## 2011-10-14 DIAGNOSIS — E213 Hyperparathyroidism, unspecified: Secondary | ICD-10-CM | POA: Insufficient documentation

## 2011-10-14 HISTORY — DX: Personal history of other diseases of the digestive system: Z87.19

## 2011-10-14 HISTORY — DX: Family history of other specified conditions: Z84.89

## 2011-10-14 LAB — CBC
MCHC: 33.1 g/dL (ref 30.0–36.0)
RDW: 14.1 % (ref 11.5–15.5)

## 2011-10-14 LAB — URINALYSIS, MICROSCOPIC ONLY
Hgb urine dipstick: NEGATIVE
Nitrite: NEGATIVE
Specific Gravity, Urine: 1.014 (ref 1.005–1.030)
Urobilinogen, UA: 0.2 mg/dL (ref 0.0–1.0)
pH: 6.5 (ref 5.0–8.0)

## 2011-10-14 LAB — COMPREHENSIVE METABOLIC PANEL
AST: 16 U/L (ref 0–37)
Albumin: 3.8 g/dL (ref 3.5–5.2)
BUN: 24 mg/dL — ABNORMAL HIGH (ref 6–23)
Creatinine, Ser: 0.87 mg/dL (ref 0.50–1.10)
Potassium: 4.2 mEq/L (ref 3.5–5.1)
Total Protein: 6.3 g/dL (ref 6.0–8.3)

## 2011-10-14 LAB — TSH: TSH: 0.052 u[IU]/mL — ABNORMAL LOW (ref 0.350–4.500)

## 2011-10-14 LAB — SEDIMENTATION RATE: Sed Rate: 5 mm/hr (ref 0–22)

## 2011-10-14 LAB — POCT I-STAT, CHEM 8
BUN: 24 mg/dL — ABNORMAL HIGH (ref 6–23)
Calcium, Ion: 1.2 mmol/L (ref 1.13–1.30)
Chloride: 105 mEq/L (ref 96–112)
Creatinine, Ser: 0.9 mg/dL (ref 0.50–1.10)
Potassium: 3.8 mEq/L (ref 3.5–5.1)
Sodium: 140 mEq/L (ref 135–145)
TCO2: 23 mmol/L (ref 0–100)

## 2011-10-14 LAB — POCT I-STAT TROPONIN I: Troponin i, poc: 0.01 ng/mL (ref 0.00–0.08)

## 2011-10-14 MED ORDER — ACETAMINOPHEN 325 MG PO TABS: 650.0000 mg | ORAL_TABLET | Freq: Four times a day (QID) | ORAL | Status: DC | PRN

## 2011-10-14 MED ORDER — SIMVASTATIN 10 MG PO TABS
10.0000 mg | ORAL_TABLET | Freq: Every day | ORAL | Status: DC
  Administered 2011-10-14: 10 mg via ORAL
  Filled 2011-10-14 (×2): qty 1

## 2011-10-14 MED ORDER — SODIUM CHLORIDE 0.9 % IJ SOLN
3.0000 mL | Freq: Two times a day (BID) | INTRAMUSCULAR | Status: DC
  Administered 2011-10-14: 3 mL via INTRAVENOUS

## 2011-10-14 MED ORDER — ONDANSETRON HCL 4 MG/2ML IJ SOLN: 4.0000 mg | Freq: Four times a day (QID) | INTRAMUSCULAR | Status: DC | PRN

## 2011-10-14 MED ORDER — SODIUM CHLORIDE 0.9 % IV SOLN
INTRAVENOUS | Status: DC
  Administered 2011-10-14 (×2): via INTRAVENOUS

## 2011-10-14 MED ORDER — CLOPIDOGREL BISULFATE 75 MG PO TABS
75.0000 mg | ORAL_TABLET | Freq: Every day | ORAL | Status: DC
  Administered 2011-10-14 – 2011-10-15 (×2): 75 mg via ORAL
  Filled 2011-10-14 (×2): qty 1

## 2011-10-14 MED ORDER — NITROFURANTOIN MONOHYD MACRO 100 MG PO CAPS
100.0000 mg | ORAL_CAPSULE | Freq: Two times a day (BID) | ORAL | Status: DC
  Administered 2011-10-14 – 2011-10-15 (×2): 100 mg via ORAL
  Filled 2011-10-14 (×3): qty 1

## 2011-10-14 MED ORDER — ACETAMINOPHEN 650 MG RE SUPP: 650.0000 mg | Freq: Four times a day (QID) | RECTAL | Status: DC | PRN

## 2011-10-14 MED ORDER — MECLIZINE HCL 25 MG PO TABS
50.0000 mg | ORAL_TABLET | Freq: Three times a day (TID) | ORAL | Status: DC | PRN
  Filled 2011-10-14: qty 2

## 2011-10-14 MED ORDER — MECLIZINE HCL 25 MG PO TABS
25.0000 mg | ORAL_TABLET | Freq: Once | ORAL | Status: AC
  Administered 2011-10-14: 25 mg via ORAL
  Filled 2011-10-14: qty 1

## 2011-10-14 MED ORDER — ONDANSETRON HCL 4 MG PO TABS: 4.0000 mg | ORAL_TABLET | Freq: Four times a day (QID) | ORAL | Status: DC | PRN

## 2011-10-14 MED ORDER — LORAZEPAM 1 MG PO TABS
0.5000 mg | ORAL_TABLET | Freq: Once | ORAL | Status: AC
  Administered 2011-10-14: 0.5 mg via ORAL
  Filled 2011-10-14: qty 1

## 2011-10-14 NOTE — Consult Note (Signed)
[facility] CONSULT NOTE     Reason for Consult: dizziness    HPI:    [name] is an 78 y.o. female who recently was seen in [facility] hospital and diagnosed with UTI.  Currently on Macrobid for her UTI and has not finished the medication.  Patient woke up this AM and noted she felt extremely weak throughout.  She tried to get up and noted she was "woozy" headed.  She was unable to support her weight and slid backward.  She denies any vertigo, diplopia, blurred vision, localizing or lateralizing weakness or numbness. At present time she is in the hospital bed.  It takes a lot for her to just prop herself up in bed. Her ocular orbits are sunken in and her voice is weak.  She states that the sensation she gets when she sits up is similar to that of standing up too fast, lightheadedness. She denies vertigo.   Past Medical History  Diagnosis Date  . ANEMIA, PERNICIOUS 09/10/2006  . CORONARY ARTERY DISEASE 11/07/2008  . CVA WITH LEFT HEMIPARESIS 07/22/2007    May, 2009, neurology decided aspirin and Plavix at that time.  . DERMATITIS 03/02/2007  . HYPERTENSION 07/15/2006  . MENINGIOMA 08/27/2006    Occipital meningioma, surgery, [facility], 2008 /  MRI, [place], June, 2011, no change in lesion, history believe the patient had a gamma knife treatment of a right occipital atypical meningioma  . OSTEOARTHRITIS 07/15/2006  . PULMONARY NODULE 01/14/2007    [place], December, 2011  . Takotsubo syndrome 04/22/2008    MI, May, 2008, question takotsubo event  . THYROID NODULE 01/14/2007  . IBS (irritable bowel syndrome)   . DJD (degenerative joint disease)   . Ejection fraction     EF 60%, echo, July, 2010, (  EF improved after tach a suitable event 2008)  . Subarachnoid hemorrhage following injury 2007    Subarachnoid hemorrhage secondary to a fall December, 2007  . Bradycardia     August, 2013  . Stroke     Past Surgical History  Procedure Date  . Cholecystectomy     . Abdominal hysterectomy   . Hip surgery   . Brain tumor excision     Meningioma; treated at [facility] in 2008  . Shoulder surgery   . Joint replacement     rt THR    Family History  Problem Relation Age of Onset  . Heart attack Brother 29    Social History:  reports that she has never smoked. She has never used smokeless tobacco. She reports that she does not drink alcohol or use illicit drugs.  Allergies  Allergen Reactions  . Atorvastatin Other (See Comments)    Raises liver enzymes  . Phenytoin Other (See Comments)    Blood clots  . Promethazine Hcl Other (See Comments)    Severe sedation  . Sulfacetamide Sodium     REACTION: sulfa  . Sulfamethoxazole     REACTION: unspecified    Medications:    Current Facility-Administered Medications  Medication Dose Route Frequency Provider Last Rate Last Dose  . 0.9 %  sodium chloride infusion   Intravenous Continuous Sunnie Nielsen, MD 125 mL/hr at 10/14/11 [phone]    . LORazepam (ATIVAN) tablet 0.5 mg  0.5 mg Oral Once Sunnie Nielsen, MD   0.5 mg at 10/14/11 0619  . meclizine (ANTIVERT) tablet 25 mg  25  mg Oral Once Sunnie Nielsen, MD   25 mg at 10/14/11 6440   Current Outpatient Prescriptions  Medication Sig Dispense Refill  . bethanechol (URECHOLINE) 10 MG tablet 1-2 tabs po qid prn urinary retention  40 tablet  0  . carvedilol (COREG) 3.125 MG tablet Take 3.125 mg by mouth 2 (two) times daily with a meal.      . carvedilol (COREG) 6.25 MG tablet TAKE ONE-HALF (1/2) TABLET TWICE A DAY  90 tablet  2  . clopidogrel (PLAVIX) 75 MG tablet Take 1 tablet (75 mg total) by mouth daily.  90 tablet  6  . conjugated estrogens (PREMARIN) vaginal cream Place 1 g vaginally daily as needed.       . cyanocobalamin (COBAL-1000) 1000 MCG/ML injection Inject 1,000 mcg into the muscle every 30 (thirty) days.        Marland Kitchen L-Methylfolate-Algae-B12-B6 (METANX) 3-90.314-2-35 MG CAPS Take 1 capsule by mouth daily.       Marland Kitchen lidocaine (LIDODERM) 5 % Place 1 patch  onto the skin every 12 (twelve) hours. 12 hours on and 12 hours off.      . nitrofurantoin, macrocrystal-monohydrate, (MACROBID) 100 MG capsule Take 100 mg by mouth 2 (two) times daily.      . nitroGLYCERIN (NITROSTAT) 0.4 MG SL tablet Place 0.4 mg under the tongue every 5 (five) minutes as needed.        . pravastatin (PRAVACHOL) 20 MG tablet Take 20 mg by mouth every evening.      . traMADol (ULTRAM) 50 MG tablet       . triamcinolone ointment (KENALOG) 0.1 % Apply 1 application topically daily as needed. Dry skin  30 g  2     Review of Systems - General ROS: negative for - chills, fatigue, fever or hot flashes Hematological and Lymphatic ROS: negative for - bruising, fatigue, jaundice or pallor Endocrine ROS: negative for - hair pattern changes, hot flashes, mood swings or skin changes Respiratory ROS: negative for - cough, hemoptysis, orthopnea or wheezing Cardiovascular ROS: negative for - dyspnea on exertion, orthopnea, palpitations or shortness of breath Gastrointestinal ROS: negative for - abdominal pain, appetite loss, blood in stools, diarrhea or hematemesis Musculoskeletal ROS: negative for - joint pain, joint stiffness, joint swelling or muscle pain Neurological ROS: positive for - dizziness and weakness Dermatological ROS: negative for dry skin, pruritus and rash   Blood pressure 158/62, pulse 64, temperature 97.4 F (36.3 C), temperature source Oral, resp. rate 18, SpO2 100.00%.   Neurologic Examination:   Mental Status: Alert, oriented X 3.  Speech hypophonic, fluent without evidence of aphasia. Able to follow 3 step commands without difficulty.  Cranial Nerves: II-Visual fields grossly intact. III/IV/VI-Extraocular movements intact.  Pupils reactive bilaterally -right pupil irregular secondary to surgery. Ptosis not present. No nystagmus noted other than end gaze. V/VII-Smile symmetric VIII-grossly intact IX/X-normal gag XI-bilateral shoulder shrug XII-midline  tongue extension Motor: 4/5 bilaterally with normal tone and bulk. Right hip flexion is weaker than left due to previous right THA. No drift noted Sensory: Pinprick and light touch intact throughout, bilaterally Deep Tendon Reflexes:  2+ throughout.       Plantars:      Right:  downgoing     Left:  Downgoing Cerebellar: Normal finger-to-nose,  normal heel-to-shin test (limited due to strength).   Gait: unable due to weakness   Lab Results  Component Value Date/Time   CHOL 142 03/06/2011  2:20 AM    Results for orders placed during the  hospital encounter of 10/14/11 (from the past 48 hour(s))  CBC     Status: Abnormal   Collection Time   10/14/11  6:13 AM      Component Value Range Comment   WBC 8.7  4.0 - 10.5 K/uL    RBC 3.96  3.87 - 5.11 MIL/uL    Hemoglobin 11.7 (*) 12.0 - 15.0 g/dL    HCT 16.1 (*) 09.6 - 46.0 %    MCV 89.4  78.0 - 100.0 fL    MCH 29.5  26.0 - 34.0 pg    MCHC 33.1  30.0 - 36.0 g/dL    RDW 04.5  40.9 - 81.1 %    Platelets 198  150 - 400 K/uL   POCT I-STAT TROPONIN I     Status: Normal   Collection Time   10/14/11  6:19 AM      Component Value Range Comment   Troponin i, poc 0.01  0.00 - 0.08 ng/mL    Comment 3            POCT I-STAT, CHEM 8     Status: Abnormal   Collection Time   10/14/11  6:20 AM      Component Value Range Comment   Sodium 140  135 - 145 mEq/L    Potassium 3.8  3.5 - 5.1 mEq/L    Chloride 105  96 - 112 mEq/L    BUN 24 (*) 6 - 23 mg/dL    Creatinine, Ser 9.14  0.50 - 1.10 mg/dL    Glucose, Bld 782 (*) 70 - 99 mg/dL    Calcium, Ion 9.56  2.13 - 1.30 mmol/L    TCO2 23  0 - 100 mmol/L    Hemoglobin 11.9 (*) 12.0 - 15.0 g/dL    HCT 08.6 (*) 57.8 - 46.0 %     Ct Head Wo Contrast  10/14/2011  *RADIOLOGY REPORT*  Clinical Data: Dizziness.  Left-sided axillary pain.  Diaphoresis. Nausea and vomiting.  Previous stroke affecting the left side.  CT HEAD WITHOUT CONTRAST  Technique:  Contiguous axial images were obtained from the base of the skull  through the vertex without contrast.  Comparison: 09/15/2010  Findings: Diffuse cerebral atrophy.  Postoperative changes in the right posterior occipital region with craniotomy and underlying encephalomalacia.  There is stable since previous study.  Low attenuation change in the deep white matter bilaterally suggesting small vessel ischemia.  No mass effect or midline shift.  No abnormal extra-axial fluid collections.  Gray-white matter junctions are distinct.  Basal cisterns are not effaced.  No evidence of acute intracranial hemorrhage.  Vascular calcifications.  No depressed skull fractures.  Visualized portions of the paranasal sinuses and mastoid air cells are not opacified.  IMPRESSION: No acute intracranial abnormalities.  Diffuse atrophy and small vessel ischemic changes.  Postoperative changes and encephalomalacia in the right occipital region.  No change since prior study.   Original Report Authenticated By: Marlon Pel, M.D.    Dg Chest Portable 1 View  10/14/2011  *RADIOLOGY REPORT*  Clinical Data: Dizziness.  Shortness of breath.  Weakness.  PORTABLE CHEST - 1 VIEW  Comparison: 03/05/2011  Findings: Shallow inspiration.  Cardiac enlargement with normal pulmonary vascularity.  Tortuous and calcified aorta.  Prominent right hilar shadow could represent vascular shadow or mediastinal mass/lymphadenopathy.  CT recommended for further evaluation. Emphysematous changes in the lungs.  Interstitial fibrosis.  No blunting of costophrenic angles.  No pneumothorax.  Postoperative changes in the right shoulder.  IMPRESSION: Nonspecific right hilar shadow.  CT recommended for further evaluation to exclude vascular process versus mass.  Emphysema and fibrosis in the lungs.   Original Report Authenticated By: Marlon Pel, M.D.      Assessment/Plan:   78 YO female who woke up this AM noting to be "dizzy" and "woozy" when she went to get out of bed.  She was unable to ambulate due to generalized  weakness and slid tot he floor. Per recent out patient office visit 10/03/2011 she "recently has had worsening of fatigue poor appetite and some associated documented weight loss".   Exam shows Generalized weakness without localizing or lateralizing neurological deficits.   No nystagmus on exam except for endgaze which can be normal.  MRI brain is negative for acute stroke.  At this time differential includes underlying generalized weakness secondary to FTT/ malnutrition/untreated UTI.    Recommend:  1) UA, TSH, SED rate 2) Continue Plavix once passes stroke swallow screen.  3) PT/OT   Will discuss with Dr. Amada Jupiter.  Felicie Morn PA-C [facility] [phone]  10/14/2011, 8:39 AM     I have seen and evaluated the patient. I have reviewed the above note and made appropriate changes. I suspect asthenia/lightheadedness due to general medical condition(FTT).  Ritta Slot, MD [facility] [phone]

## 2011-10-14 NOTE — Progress Notes (Signed)
Patient ID: [name], female   DOB: 1922/11/12, 78 y.o.   MRN: [id number]  Rec'd report from Dr. Dierdre Highman in ED. Pt is 78 y/o female who is usually independent with ADL's. She awoke with c/o dizziness. She has been treated with antivert and fluids in ED and has has not responded. Neurology consulted and they recommend an MRI which has not yet been done. This patient has not been evaluated by me and this is just a report of my conversation wit the ED Physician for disposition purposes. Eddith Mentor A.

## 2011-10-14 NOTE — ED Notes (Signed)
Patient report called to Marchelle Folks, RN on 3000.  Patient in MRI at this time and will transported to 3W12 when exam competed.  Receiving RN aware and MRI aware of same.

## 2011-10-14 NOTE — ED Notes (Signed)
Patient's family directed to 3W12 at this time.

## 2011-10-14 NOTE — ED Notes (Signed)
Attempt to ambulate patient at this time with 2 person assist.  Patient remaining dizzy and unable to remain standing.  Patient placed back in bed and MD notified of results.

## 2011-10-14 NOTE — ED Notes (Signed)
Pt arrived via [facility] C/o Dizziness, and left sided axillary pain under her armpit with diaphoresis. Denies fall or vertigo, N/V. Hx: of stroke, and MI. Stroke effected Lt side. 20 Rt AC

## 2011-10-14 NOTE — ED Provider Notes (Signed)
History     CSN: [id number]  Arrival date & time 10/14/11  4098   First MD Initiated Contact with Patient 10/14/11 [phone]      Chief Complaint  Patient presents with  . Dizziness    (Consider location/radiation/quality/duration/timing/severity/associated sxs/prior treatment) HPI HX per PT and family. Woke up around 0430 to use the bathroom and became severely dizzy with head spinning, unable to walk. No CP or SOB, no unilateral weakness or numbness. H/o previous stroke but no h/o similar symptoms. No known aggrevating or alleviating factors.  Past Medical History  Diagnosis Date  . ANEMIA, PERNICIOUS 09/10/2006  . CORONARY ARTERY DISEASE 11/07/2008  . CVA WITH LEFT HEMIPARESIS 07/22/2007    May, 2009, neurology decided aspirin and Plavix at that time.  . DERMATITIS 03/02/2007  . HYPERTENSION 07/15/2006  . MENINGIOMA 08/27/2006    Occipital meningioma, surgery, [facility], 2008 /  MRI, [place], June, 2011, no change in lesion, history believe the patient had a gamma knife treatment of a right occipital atypical meningioma  . OSTEOARTHRITIS 07/15/2006  . PULMONARY NODULE 01/14/2007    [place], December, 2011  . Takotsubo syndrome 04/22/2008    MI, May, 2008, question takotsubo event  . THYROID NODULE 01/14/2007  . IBS (irritable bowel syndrome)   . DJD (degenerative joint disease)   . Ejection fraction     EF 60%, echo, July, 2010, (  EF improved after tach a suitable event 2008)  . Subarachnoid hemorrhage following injury 2007    Subarachnoid hemorrhage secondary to a fall December, 2007  . Bradycardia     August, 2013  . Stroke     Past Surgical History  Procedure Date  . Cholecystectomy   . Abdominal hysterectomy   . Hip surgery   . Brain tumor excision     Meningioma; treated at [facility] in 2008  . Shoulder surgery   . Joint replacement     rt THR    Family History  Problem Relation Age of Onset  . Heart attack Brother 18    History  Substance Use Topics  . Smoking  status: Never Smoker   . Smokeless tobacco: Never Used  . Alcohol Use: No    OB History    Grav Para Term Preterm Abortions TAB SAB Ect Mult Living                  Review of Systems  Constitutional: Negative for fever and chills.  HENT: Negative for neck pain and neck stiffness.   Eyes: Negative for pain.  Respiratory: Negative for shortness of breath.   Gastrointestinal: Negative for abdominal pain.  Genitourinary: Negative for dysuria.  Musculoskeletal: Negative for back pain.  Skin: Negative for rash.  Neurological: Positive for dizziness. Negative for seizures and headaches.  All other systems reviewed and are negative.    Allergies  Atorvastatin; Phenytoin; Promethazine hcl; Sulfacetamide sodium; and Sulfamethoxazole  Home Medications   Current Outpatient Rx  Name Route Sig Dispense Refill  . BETHANECHOL CHLORIDE 10 MG PO TABS  1-2 tabs po qid prn urinary retention 40 tablet 0  . CARVEDILOL 3.125 MG PO TABS Oral Take 3.125 mg by mouth 2 (two) times daily with a meal.    . CARVEDILOL 6.25 MG PO TABS  TAKE ONE-HALF (1/2) TABLET TWICE A DAY 90 tablet 2  . CLOPIDOGREL BISULFATE 75 MG PO TABS Oral Take 1 tablet (75 mg total) by mouth daily. 90 tablet 6  . ESTROGENS, CONJUGATED 0.625 MG/GM VA CREA Vaginal Place  1 g vaginally daily as needed.     . CYANOCOBALAMIN 1000 MCG/ML IJ SOLN Intramuscular Inject 1,000 mcg into the muscle every 30 (thirty) days.      Marland Kitchen METANX 3-90.314-2-35 MG PO CAPS Oral Take 1 capsule by mouth daily.     Marland Kitchen LIDOCAINE 5 % EX PTCH Transdermal Place 1 patch onto the skin every 12 (twelve) hours. 12 hours on and 12 hours off.    Marland Kitchen NITROFURANTOIN MONOHYD MACRO 100 MG PO CAPS Oral Take 100 mg by mouth 2 (two) times daily.    Marland Kitchen NITROGLYCERIN 0.4 MG SL SUBL Sublingual Place 0.4 mg under the tongue every 5 (five) minutes as needed.      Marland Kitchen PRAVASTATIN SODIUM 20 MG PO TABS Oral Take 20 mg by mouth every evening.    Marland Kitchen TRAMADOL HCL 50 MG PO TABS      .  TRIAMCINOLONE ACETONIDE 0.1 % EX OINT Topical Apply 1 application topically daily as needed. Dry skin 30 g 2    BP 125/64  Pulse 62  Temp 97.5 F (36.4 C) (Oral)  Resp 20  SpO2 93%  Physical Exam  Constitutional: She is oriented to person, place, and time. She appears well-developed and well-nourished.  HENT:  Head: Normocephalic and atraumatic.  Eyes: Conjunctivae and EOM are normal. Pupils are equal, round, and reactive to light.  Neck: Trachea normal. Neck supple. No thyromegaly present.  Cardiovascular: Normal rate, regular rhythm, S1 normal, S2 normal and normal pulses.     No systolic murmur is present   No diastolic murmur is present  Pulses:      Radial pulses are 2+ on the right side, and 2+ on the left side.  Pulmonary/Chest: Effort normal and breath sounds normal. She has no wheezes. She has no rhonchi. She has no rales. She exhibits no tenderness.  Abdominal: Soft. Normal appearance and bowel sounds are normal. There is no tenderness. There is no CVA tenderness and negative Murphy's sign.  Musculoskeletal:       BLE:s Calves nontender, no cords or erythema, negative Homans sign  Neurological: She is alert and oriented to person, place, and time. She has normal strength. No cranial nerve deficit or sensory deficit. GCS eye subscore is 4. GCS verbal subscore is 5. GCS motor subscore is 6.       Lateral nystagmus. unable to ambulate   Skin: Skin is warm and dry. No rash noted. She is not diaphoretic.  Psychiatric: Her speech is normal.    ED Course  Procedures (including critical care time)  Results for orders placed during the hospital encounter of 10/14/11  CBC      Component Value Range   WBC 8.7  4.0 - 10.5 K/uL   RBC 3.96  3.87 - 5.11 MIL/uL   Hemoglobin 11.7 (*) 12.0 - 15.0 g/dL   HCT 16.1 (*) 09.6 - 04.5 %   MCV 89.4  78.0 - 100.0 fL   MCH 29.5  26.0 - 34.0 pg   MCHC 33.1  30.0 - 36.0 g/dL   RDW 40.9  81.1 - 91.4 %   Platelets 198  150 - 400 K/uL  POCT  I-STAT, CHEM 8      Component Value Range   Sodium 140  135 - 145 mEq/L   Potassium 3.8  3.5 - 5.1 mEq/L   Chloride 105  96 - 112 mEq/L   BUN 24 (*) 6 - 23 mg/dL   Creatinine, Ser 7.82  0.50 - 1.10 mg/dL  Glucose, Bld 112 (*) 70 - 99 mg/dL   Calcium, Ion 8.11  9.14 - 1.30 mmol/L   TCO2 23  0 - 100 mmol/L   Hemoglobin 11.9 (*) 12.0 - 15.0 g/dL   HCT 78.2 (*) 95.6 - 21.3 %  POCT I-STAT TROPONIN I      Component Value Range   Troponin i, poc 0.01  0.00 - 0.08 ng/mL   Comment 3            Ct Head Wo Contrast  10/14/2011  *RADIOLOGY REPORT*  Clinical Data: Dizziness.  Left-sided axillary pain.  Diaphoresis. Nausea and vomiting.  Previous stroke affecting the left side.  CT HEAD WITHOUT CONTRAST  Technique:  Contiguous axial images were obtained from the base of the skull through the vertex without contrast.  Comparison: 09/15/2010  Findings: Diffuse cerebral atrophy.  Postoperative changes in the right posterior occipital region with craniotomy and underlying encephalomalacia.  There is stable since previous study.  Low attenuation change in the deep white matter bilaterally suggesting small vessel ischemia.  No mass effect or midline shift.  No abnormal extra-axial fluid collections.  Gray-white matter junctions are distinct.  Basal cisterns are not effaced.  No evidence of acute intracranial hemorrhage.  Vascular calcifications.  No depressed skull fractures.  Visualized portions of the paranasal sinuses and mastoid air cells are not opacified.  IMPRESSION: No acute intracranial abnormalities.  Diffuse atrophy and small vessel ischemic changes.  Postoperative changes and encephalomalacia in the right occipital region.  No change since prior study.   Original Report Authenticated By: Marlon Pel, M.D.    Mr Abdomen W Wo Contrast  09/24/2011  *RADIOLOGY REPORT*  Clinical Data: 78 year old with pancreatic ductal dilatation on CT. History of cholecystectomy.  MRI ABDOMEN WITH AND WITHOUT  CONTRAST  Technique:  Multiplanar multisequence MR imaging of the abdomen was performed both before and after administration of intravenous contrast.  Contrast: 11mL MULTIHANCE GADOBENATE DIMEGLUMINE 529 MG/ML IV SOLN  Comparison: Abdominal CT 09/13/2011.  MRCP 07/22/2006.  Findings: There is stable mild biliary dilatation status post cholecystectomy.  There is no evidence of choledocholithiasis.  A tiny cyst in the central right hepatic lobe is unchanged.  There are no enhancing or otherwise suspicious liver lesions.  As demonstrated on recent CT, there is irregular cyst formation throughout the pancreatic tail which is likely associated with the pancreatic duct which appears mildly dilated.  These findings are new compared with the 2008 MRCP.  Current examination does not include dedicated MRCP images.  However, postcontrast images demonstrate no solid enhancement within or surrounding the pancreas.  There is no surrounding adenopathy, inflammatory change or fluid collection.  The spleen, adrenal glands and kidneys demonstrate no suspicious findings.  T12 hemangioma and periarticular enhancement associated with the facet joints near the thoracolumbar junction are noted.  IMPRESSION:  1.  New mild pancreatic ductal dilatation within the pancreatic tail with possible adjacent small cystic lesions.  No abnormal enhancement, surrounding inflammatory change or adenopathy identified.  Findings are favored to reflect the sequela of pancreatitis, although could reflect small side branch intraductal papillary mucinous tumors. No aggressive features are identified. 2.  Imaging follow-up in 1 year (preferably with MRI) recommended to assess stability.  This recommendation follows ACR consensus guidelines:  Managing Incidental Findings on Abdominal CT:  White Paper of the ACR Incidental Findings Committee.  J Am Coll Radiol 2010;7:754-773 3.  Stable mild biliary dilatation status post cholecystectomy.  No acute abdominal  findings identified.  Original Report Authenticated By: Gerrianne Scale, M.D.    Dg Chest Portable 1 View  10/14/2011  *RADIOLOGY REPORT*  Clinical Data: Dizziness.  Shortness of breath.  Weakness.  PORTABLE CHEST - 1 VIEW  Comparison: 03/05/2011  Findings: Shallow inspiration.  Cardiac enlargement with normal pulmonary vascularity.  Tortuous and calcified aorta.  Prominent right hilar shadow could represent vascular shadow or mediastinal mass/lymphadenopathy.  CT recommended for further evaluation. Emphysematous changes in the lungs.  Interstitial fibrosis.  No blunting of costophrenic angles.  No pneumothorax.  Postoperative changes in the right shoulder.  IMPRESSION: Nonspecific right hilar shadow.  CT recommended for further evaluation to exclude vascular process versus mass.  Emphysema and fibrosis in the lungs.   Original Report Authenticated By: Marlon Pel, M.D.      Date: 10/14/2011  Rate: 57  Rhythm: sinus bradycardia  QRS Axis: normal  Intervals: PR prolonged  ST/T Wave abnormalities: nonspecific ST changes  Conduction Disutrbances:first-degree A-V block   Narrative Interpretation:   Old EKG Reviewed: none available  IVFs, antivert, ativan - no change in symptoms. Still unable to walk  7:26 AM PT remians symptomatic unable to walk, d/w NEU Dr Amada Jupiter - he will evaluate PT bedside. recs MR brain now.   7:54 AM d/w Dr Jerolyn Center plan admit tele, MRI pending  MDM   Vertigo h/o CVA  NEU eval and MED admit.         Sunnie Nielsen, MD 10/14/11 [phone]

## 2011-10-14 NOTE — H&P (Signed)
[facility] History and Physical  [name] HQI:[id number] DOB: 02-Mar-1922 DOA: 10/14/2011  Referring physician: Emergency room PCP: Rogelia Boga, MD   Chief Complaint: Dizziness  HPI: [name] is a 78 y.o. female  With past oral history of CVA, hypertension who presented to the emergency room with complaints of sudden onset severe dizziness with head spinning unable to walk. Patient also felt very lightheaded. She came in the emergency room to be evaluated. CT scan of head was unremarkable and she was admitted for possibility of CVA. Patient reports of generalized weakness but no signs of any focal one-sided weakness or headache or vision changes or trouble swallowing, speaking or acute confusion. Patient has had previous CVA without have left her with some mild left-sided weakness and nothing like this. Patient was directly admitted to the floor and she was given fluids and some Antivert which has not relieved her symptoms.  Review of Systems: I saw patient on the floor, she was doing okay. She denies any headache or vision changes. She does complain of dizziness and room spinning sensations when she tries to sit up. She denies any trouble swallowing, no chest pain or palpitations no shortness of breath no wheezing no coughing no abdominal pain no hematuria or dysuria no constipation or diarrhea. No focal extremity numbness weakness or pain. Only complains of pain on the below the left axilla lateral to the left breast when she turns in a certain direction. The area is quite tender and is been going on for some time. It almost represents like shingles but without any rash or lesion which the patient has had shingles before.  Past Medical History  Diagnosis Date  . ANEMIA, PERNICIOUS 09/10/2006  . CORONARY ARTERY DISEASE 11/07/2008  . CVA WITH LEFT HEMIPARESIS 07/22/2007    May, 2009, neurology decided aspirin and Plavix at that time.  . DERMATITIS 03/02/2007  .  HYPERTENSION 07/15/2006  . MENINGIOMA 08/27/2006    Occipital meningioma, surgery, [facility], 2008 /  MRI, [place], June, 2011, no change in lesion, history believe the patient had a gamma knife treatment of a right occipital atypical meningioma  . OSTEOARTHRITIS 07/15/2006  . PULMONARY NODULE 01/14/2007    [place], December, 2011  . Takotsubo syndrome 04/22/2008    MI, May, 2008, question takotsubo event  . THYROID NODULE 01/14/2007  . IBS (irritable bowel syndrome)   . DJD (degenerative joint disease)   . Ejection fraction     EF 60%, echo, July, 2010, (  EF improved after tach a suitable event 2008)  . Subarachnoid hemorrhage following injury 2007    Subarachnoid hemorrhage secondary to a fall December, 2007  . Bradycardia     August, 2013  . Stroke   . Family history of anesthesia complication     daughter has a hard time waking up  . Myocardial infarction   . Shortness of breath   . H/O hiatal hernia   . UTI (lower urinary tract infection) 10/14/2011   Past Surgical History  Procedure Date  . Cholecystectomy   . Abdominal hysterectomy   . Hip surgery   . Brain tumor excision     Meningioma; treated at [facility] in 2008  . Shoulder surgery   . Joint replacement     rt THR   Social History:  reports that she has never smoked. She has never used smokeless tobacco. She reports that she does not drink alcohol or use illicit drugs. Patient lives at home with family. She is normally able  to participate in most activities of daily living  Allergies  Allergen Reactions  . Atorvastatin Other (See Comments)    Raises liver enzymes  . Phenytoin Other (See Comments)    Blood clots  . Promethazine Hcl Other (See Comments)    Severe sedation  . Sulfacetamide Sodium     REACTION: sulfa  . Sulfamethoxazole     REACTION: unspecified    Family History  Problem Relation Age of Onset  . Heart attack Brother 71    Prior to Admission medications   Medication Sig Start Date End Date  Taking? Authorizing Provider  bethanechol (URECHOLINE) 10 MG tablet Take 10-20 mg by mouth 4 (four) times daily as needed. For urinary retention.   Yes Historical Provider, MD  carvedilol (COREG) 3.125 MG tablet Take 3.125 mg by mouth 2 (two) times daily as needed. If blood pressure increases.   Yes Historical Provider, MD  clopidogrel (PLAVIX) 75 MG tablet Take 75 mg by mouth daily.   Yes Historical Provider, MD  conjugated estrogens (PREMARIN) vaginal cream Place 1 g vaginally daily as needed. For hot flashes.   Yes Historical Provider, MD  cyanocobalamin (,VITAMIN B-12,) 1000 MCG/ML injection Inject 1,000 mcg into the muscle every 30 (thirty) days.   Yes Historical Provider, MD  lidocaine (LIDODERM) 5 % Place 1 patch onto the skin daily as needed. Remove & Discard patch within 12 hours or as directed by MD. For pain.   Yes Historical Provider, MD  nitrofurantoin, macrocrystal-monohydrate, (MACROBID) 100 MG capsule Take 100 mg by mouth 2 (two) times daily.   Yes Historical Provider, MD  nitroGLYCERIN (NITROSTAT) 0.4 MG SL tablet Place 0.4 mg under the tongue every 5 (five) minutes as needed. For chest pain.   Yes Historical Provider, MD  pravastatin (PRAVACHOL) 20 MG tablet Take 20 mg by mouth every evening. 09/25/10  Yes Gordy Savers, MD  traMADol (ULTRAM) 50 MG tablet Take 50 mg by mouth every 6 (six) hours as needed. For pain. 09/13/11  Yes Historical Provider, MD  triamcinolone ointment (KENALOG) 0.1 % Apply 1 application topically 2 (two) times daily as needed. For dry skin.   Yes Historical Provider, MD   Physical Exam: Filed Vitals:   10/14/11 0615 10/14/11 0814 10/14/11 1020 10/14/11 1414  BP: 139/63 158/62 158/79 107/61  Pulse: 59 64 63 58  Temp:  97.4 F (36.3 C) 98 F (36.7 C) 98.2 F (36.8 C)  TempSrc:  Oral Oral Oral  Resp: 14 18 16 18   Height:   5\' 5"  (1.651 m)   SpO2: 98% 100% 100% 97%     General:  Alert and oriented x3, fatigue, looks about stated age, mild  distress secondary to dizziness  Eyes: Extraocular muscles are intact, sclera nonicteric  ENT: Normocephalic, atraumatic, mucous membranes are dry  Neck: Supple, patient has some very mild appreciable thyromegaly diffuse  Cardiovascular: Sinus rhythm with borderline bradycardia  Respiratory: Clear to auscultation bilaterally  Abdomen: Soft, nontender, nondistended, hypoactive bowel sounds  Skin: No obvious skin tears or lesions although the skin is very fragile  Musculoskeletal: No clubbing or cyanosis, trace pitting edema  Psychiatric: Appropriate no signs of psychoses  Neurologic: No focal deficits  Labs on Admission:  Basic Metabolic Panel:  Lab 10/14/11 3474 10/14/11 0620  NA 140 140  K 4.2 3.8  CL 103 105  CO2 29 --  GLUCOSE 109* 112*  BUN 24* 24*  CREATININE 0.87 0.90  CALCIUM 9.5 --  MG -- --  PHOS -- --  Liver Function Tests:  Lab 10/14/11 0842  AST 16  ALT 10  ALKPHOS 77  BILITOT 0.3  PROT 6.3  ALBUMIN 3.8   CBC:  Lab 10/14/11 0620 10/14/11 0613  WBC -- 8.7  NEUTROABS -- --  HGB 11.9* 11.7*  HCT 35.0* 35.4*  MCV -- 89.4  PLT -- 198    Radiological Exams on Admission: Ct Head Wo Contrast  10/14/2011  *RADIOLOGY REPORT*  Clinical Data: Dizziness.  Left-sided axillary pain.  Diaphoresis. Nausea and vomiting.  Previous stroke affecting the left side.  CT HEAD WITHOUT CONTRAST  Technique:  Contiguous axial images were obtained from the base of the skull through the vertex without contrast.  Comparison: 09/15/2010  Findings: Diffuse cerebral atrophy.  Postoperative changes in the right posterior occipital region with craniotomy and underlying encephalomalacia.  There is stable since previous study.  Low attenuation change in the deep white matter bilaterally suggesting small vessel ischemia.  No mass effect or midline shift.  No abnormal extra-axial fluid collections.  Gray-white matter junctions are distinct.  Basal cisterns are not effaced.  No  evidence of acute intracranial hemorrhage.  Vascular calcifications.  No depressed skull fractures.  Visualized portions of the paranasal sinuses and mastoid air cells are not opacified.  IMPRESSION: No acute intracranial abnormalities.  Diffuse atrophy and small vessel ischemic changes.  Postoperative changes and encephalomalacia in the right occipital region.  No change since prior study.   Original Report Authenticated By: Marlon Pel, M.D.    Mr Brain Wo Contrast  10/14/2011  *RADIOLOGY REPORT*  Clinical Data: Dizziness.  History meningioma resection.  MRI HEAD WITHOUT CONTRAST  Technique:  Multiplanar, multiecho pulse sequences of the brain and surrounding structures were obtained according to standard protocol without intravenous contrast.  Comparison: 10/14/2011 CT.  Multiple prior MR exams most recent MR September 15, 2010.  Findings: No acute infarct.  Remote posterior right frontal lobe infarct.  Prominent small vessel disease type changes.  Prior right occipital craniotomy for resection of meningioma as per history provided with surrounding encephalomalacia/gliosis similar to the prior examination of.  No obvious recurrence detected on this unenhanced exam.  Global atrophy without hydrocephalus.  Ectatic patent major intracranial vascular structures.  Minimal to mild paranasal sinus mucosal thickening.  Transverse ligament hypertrophy.  IMPRESSION: No acute infarct.  Please see above.   Original Report Authenticated By: Fuller Canada, M.D.    Dg Chest Portable 1 View  10/14/2011  *RADIOLOGY REPORT*  Clinical Data: Dizziness.  Shortness of breath.  Weakness.  PORTABLE CHEST - 1 VIEW  Comparison: 03/05/2011  Findings: Shallow inspiration.  Cardiac enlargement with normal pulmonary vascularity.  Tortuous and calcified aorta.  Prominent right hilar shadow could represent vascular shadow or mediastinal mass/lymphadenopathy.  CT recommended for further evaluation. Emphysematous changes in the lungs.   Interstitial fibrosis.  No blunting of costophrenic angles.  No pneumothorax.  Postoperative changes in the right shoulder.  IMPRESSION: Nonspecific right hilar shadow.  CT recommended for further evaluation to exclude vascular process versus mass.  Emphysema and fibrosis in the lungs.   Original Report Authenticated By: Marlon Pel, M.D.     EKG: Independently reviewed. First degree AV block  Assessment/Plan Principal Problem:  *Vertigo: No signs of CVA in by MRI. She with Antivert. Gently hydrate. Thyroid or bradycardia may play a role in this. Active Problems:  ANEMIA, PERNICIOUS: Follow lab work in the morning.  HYPERTENSION: Stable.  CORONARY ARTERY DISEASE: Thursday stable.  Hyperlipidemia  Takotsubo syndrome  Hyperthyroidism: Patient's TSH was moderately suppressed last week and then repeat today is quite suppressed. Concern about possibility of a thyroid nodule which is in her past medical history of a final radiologic study to confirm this is causing hyperthyroidism. Have checked a stat free T3 and T4 which may be contributing to some of her symptoms.  Lightheadedness: Mild dehydration. Continue gentle IV fluids.  UTI (lower urinary tract infection): Urinalysis urine or impressive the patient has been on Macrodantin which will continue. Bradycardia: mild elevation appears to be symptomatically Ms. She was on Coreg and was told to stop this for a while and only recently restarted this. We'll hold it for now and continue to monitor her telemetry.   Code St atus: Pending Family Communication: Discussed plan with son and daughter-in-law Disposition Plan: Possibly home tomorrow  Time spent: 45 minutes  Hollice Espy [facility] Pager [phone]  If 7PM-7AM, please contact night-coverage [email or web address] Password [facility] 10/14/2011, 6:19 PM

## 2011-10-15 ENCOUNTER — Telehealth: Payer: Self-pay | Admitting: Internal Medicine

## 2011-10-15 LAB — BASIC METABOLIC PANEL
CO2: 24 mEq/L (ref 19–32)
Calcium: 9.1 mg/dL (ref 8.4–10.5)
Creatinine, Ser: 0.83 mg/dL (ref 0.50–1.10)
GFR calc Af Amer: 71 mL/min — ABNORMAL LOW (ref >90)

## 2011-10-15 LAB — T3, FREE: T3, Free: 3.8 pg/mL (ref 2.3–4.2)

## 2011-10-15 MED ORDER — MECLIZINE HCL 50 MG PO TABS: 50.0000 mg | ORAL_TABLET | Freq: Three times a day (TID) | ORAL | Status: AC | PRN

## 2011-10-15 NOTE — Telephone Encounter (Signed)
Pt was hospitalized yesterday and was discharged today. Pt has hyperthyroidism and needs a referral to see Dr Romero Belling at [facility] for a tues, wed or thurs.

## 2011-10-15 NOTE — Progress Notes (Signed)
Utilization review complete 

## 2011-10-15 NOTE — Discharge Summary (Signed)
Physician Discharge Summary  [name] WUJ:[id number] DOB: 1922-10-05 DOA: 10/14/2011  PCP: Rogelia Boga, MD  Admit date: 10/14/2011 Discharge date: 10/15/2011  Recommendations for Outpatient Follow-up:  1. Home health PT 2. Patient will follow up of [facility] for her hyperthyroidism  Discharge Diagnoses:  Principal Problem:  *Vertigo Active Problems:  ANEMIA, PERNICIOUS  HYPERTENSION  CORONARY ARTERY DISEASE  Hyperlipidemia  Takotsubo syndrome  Hyperthyroidism  Lightheadedness  UTI (lower urinary tract infection)   Discharge Condition: Improved, being discharged home  Diet recommendation: Heart healthy  Filed Weights   10/14/11 2100  Weight: 59.512 kg (131 lb 3.2 oz)    History of present illness:  78 y.o. female  With past medical history of CVA, hypertension who presented to the emergency room with complaints of sudden onset severe dizziness with head spinning unable to walk. Patient also felt very lightheaded. She came in the emergency room to be evaluated. CT scan of head was unremarkable and she was admitted for possibility of CVA. Patient reports of generalized weakness but no signs of any focal one-sided weakness or headache or vision changes or trouble swallowing, speaking or acute confusion. Patient has had previous CVA without have left her with some mild left-sided weakness and nothing like this. Patient was directly admitted to the floor and she was given fluids and some Antivert which has not relieved her symptoms.  Hospital Course:  Principal Problem:  *Vertigo: No signs of CVA in by MRI. She was given Antivert and gently hydrated. Symptoms were resolved by the following day. She was able to ambulate with the assistance  Active Problems:  ANEMIA, PERNICIOUS: Hemoglobin stayed stable at 11.9  HYPERTENSION: Stable.   CORONARY ARTERY DISEASE: Remained stable.   Hyperlipidemia : Stable during this hospitalization  Hyperthyroidism:  Patient's TSH was moderately suppressed last week and then repeat on admission was quite suppressed. Concern about possibility of a thyroid nodule which is in her past medical history of a final radiologic study to confirm this is causing hyperthyroidism. A free T4 level came back increased slightly at 1.84. I suspect that some of the patient's symptoms may be from some hyperthyroidism. Given the fact that without any medicine she is mildly bradycardic, is probably patient's best interest to followup with endocrinology to see if thyroid intervention is necessitated. She's been given a referral to [facility] .  Lightheadedness: Mild dehydration. Resolved with IV fluids   UTI (lower urinary tract infection): Urinalysis was not impressive, however the patient has been on Macrodantin which we continued. To discharge home finishing the course of her antibiotics   Bradycardia: mild elevation appears to be mildly symptomatic. She was at one time on Coreg which had been stopped and then recently restarted. Who recommended discontinuing at this time altogether   Procedures:  MRI noting no signs of acute infarct, incidental meningioma  CT scan of the head that he no signs of acute infarct  Consultations:  None  Discharge Exam: Filed Vitals:   10/14/11 1020 10/14/11 1414 10/14/11 2100 10/15/11 0500  BP: 158/79 107/61 111/53 143/56  Pulse: 63 58 78 64  Temp: 98 F (36.7 C) 98.2 F (36.8 C) 98.2 F (36.8 C) 97.6 F (36.4 C)  TempSrc: Oral Oral Oral Oral  Resp: 16 18 18 18   Height: 5\' 5"  (1.651 m)  5\' 5"  (1.651 m)   Weight:   59.512 kg (131 lb 3.2 oz)   SpO2: 100% 97% 98% 96%    General: Alert and oriented  x3, no acute distress, fatigued, looks about stated age HEENT: Normocephalic, atraumatic, mucous membranes are slightly dry Cardiovascular: Regular rate and rhythm, S1-S2 Respiratory: Clear to auscultation bilaterally Abdomen: Soft, nontender, positive bowel  sounds Extremities: No clubbing cyanosis or edema  Discharge Instructions  Discharge Orders    Future Appointments: Provider: Department: Dept Phone: Center:   11/05/2011 11:30 AM Gordy Savers, MD [facility] [phone] [facility]   12/19/2011 10:00 AM Gordy Savers, MD [facility] [phone] [facility]     Future Orders Please Complete By Expires   Diet - low sodium heart healthy      Increase activity slowly      Walk with assistance        Medication List  As of 10/15/2011 10:49 PM   STOP taking these medications         carvedilol 3.125 MG tablet         TAKE these medications         bethanechol 10 MG tablet   Commonly known as: URECHOLINE   Take 10-20 mg by mouth 4 (four) times daily as needed. For urinary retention.      clopidogrel 75 MG tablet   Commonly known as: PLAVIX   Take 75 mg by mouth daily.      conjugated estrogens vaginal cream   Commonly known as: PREMARIN   Place 1 g vaginally daily as needed. For hot flashes.      cyanocobalamin 1000 MCG/ML injection   Commonly known as: (VITAMIN B-12)   Inject 1,000 mcg into the muscle every 30 (thirty) days.      lidocaine 5 %   Commonly known as: LIDODERM   Place 1 patch onto the skin daily as needed. Remove & Discard patch within 12 hours or as directed by MD. For pain.      meclizine 50 MG tablet   Commonly known as: ANTIVERT   Take 1 tablet (50 mg total) by mouth 3 (three) times daily as needed for dizziness or nausea.      nitrofurantoin (macrocrystal-monohydrate) 100 MG capsule   Commonly known as: MACROBID   Take 100 mg by mouth 2 (two) times daily.      nitroGLYCERIN 0.4 MG SL tablet   Commonly known as: NITROSTAT   Place 0.4 mg under the tongue every 5 (five) minutes as needed. For chest pain.      pravastatin 20 MG tablet   Commonly known as: PRAVACHOL   Take 20 mg by mouth every evening.      traMADol 50 MG tablet   Commonly known as: ULTRAM   Take 50 mg by  mouth every 6 (six) hours as needed. For pain.      triamcinolone ointment 0.1 %   Commonly known as: KENALOG   Apply 1 application topically 2 (two) times daily as needed. For dry skin.           Follow-up Information    Follow up with Rogelia Boga, MD in 2 weeks.   Contact information:   [address] [place] [address] [phone]       Follow up with Romero Belling, MD. Schedule an appointment as soon as possible for a visit in 1 month. (Thyroid Specialist)    Contact information:   [address] [facility] [address] [place] [address] [phone]           The results of significant diagnostics from this hospitalization (including imaging, microbiology, ancillary and laboratory) are listed  below for reference.    Significant Diagnostic Studies: Ct Head Wo Contrast  10/14/2011   IMPRESSION: No acute intracranial abnormalities.  Diffuse atrophy and small vessel ischemic changes.  Postoperative changes and encephalomalacia in the right occipital region.  No change since prior study.   Original Report Authenticated By: Marlon Pel, M.D.    Mr Brain Wo Contrast  10/14/2011  .  IMPRESSION: No acute infarct.  Please see above.   Original Report Authenticated By: Fuller Canada, M.D.     Dg Chest Portable 1 View  10/14/2011   IMPRESSION: Nonspecific right hilar shadow.  CT recommended for further evaluation to exclude vascular process versus mass.  Emphysema and fibrosis in the lungs.   Original Report Authenticated By: Marlon Pel, M.D.       Labs: Basic Metabolic Panel:  Lab 10/15/11 1610 10/14/11 0842 10/14/11 0620  NA 141 140 140  K 3.9 4.2 3.8  CL 107 103 105  CO2 24 29 --  GLUCOSE 98 109* 112*  BUN 23 24* 24*  CREATININE 0.83 0.87 0.90  CALCIUM 9.1 9.5 --  MG -- -- --  PHOS -- -- --   Liver Function Tests:  Lab 10/14/11 0842  AST 16  ALT 10  ALKPHOS 77  BILITOT 0.3  PROT 6.3  ALBUMIN 3.8    CBC:  Lab 10/14/11 0620 10/14/11 0613  WBC -- 8.7  NEUTROABS -- --  HGB 11.9* 11.7*  HCT 35.0* 35.4*  MCV -- 89.4  PLT -- 198    Time coordinating discharge: 30 minutes  Signed:  Hollice Espy  [facility] 10/15/2011, 10:49 PM

## 2011-10-15 NOTE — Telephone Encounter (Signed)
Pls advise.  

## 2011-10-16 ENCOUNTER — Other Ambulatory Visit: Payer: Self-pay | Admitting: Internal Medicine

## 2011-10-16 ENCOUNTER — Telehealth: Payer: Self-pay | Admitting: Internal Medicine

## 2011-10-16 DIAGNOSIS — E0590 Thyrotoxicosis, unspecified without thyrotoxic crisis or storm: Secondary | ICD-10-CM

## 2011-10-16 NOTE — Care Management Note (Signed)
    Page 1 of 1   10/16/2011     10:28:43 AM   CARE MANAGEMENT NOTE 10/16/2011  Patient:  [facility]   Account Number:  [id number]  Date Initiated:  10/16/2011  Documentation initiated by:  GRAVES-BIGELOW,Crockett Rallo  Subjective/Objective Assessment:   Pt admitted with vertigo. Pt lives with family and has support of daughter. Pt d/c yesterday will set up [facility] services.     Action/Plan:   CM made referral with pt. SOC to begin within 24-48 hours of d/c.   Anticipated DC Date:  10/15/2011   Anticipated DC Plan:  HOME W HOME HEALTH SERVICES      DC Planning Services  CM consult      [facility] Choice  HOME HEALTH   Choice offered to / List presented to:  C-1 Patient        HH arranged  HH-2 PT      [facility] agency  [facility]   Status of service:  Completed, signed off Medicare Important Message given?   (If response is "NO", the following Medicare IM given date fields will be blank) Date Medicare IM given:   Date Additional Medicare IM given:    Discharge Disposition:  HOME W HOME HEALTH SERVICES  Per UR Regulation:  Reviewed for med. necessity/level of care/duration of stay  If discussed at Long Length of Stay Meetings, dates discussed:    Comments:

## 2011-10-16 NOTE — Telephone Encounter (Signed)
-   ok to

## 2011-10-16 NOTE — Telephone Encounter (Signed)
Referral ordered

## 2011-10-16 NOTE — Telephone Encounter (Signed)
Referral done

## 2011-10-16 NOTE — Telephone Encounter (Signed)
Pt called requesting a referral to see Dr. Everardo All ASAP. Requesting appt Tuesday, Wednesday or Thursday

## 2011-10-18 ENCOUNTER — Other Ambulatory Visit: Payer: Self-pay | Admitting: Internal Medicine

## 2011-10-18 MED ORDER — TRAMADOL HCL 50 MG PO TABS: 50.0000 mg | ORAL_TABLET | Freq: Four times a day (QID) | ORAL | Status: DC | PRN

## 2011-10-18 NOTE — Telephone Encounter (Signed)
done

## 2011-10-18 NOTE — Telephone Encounter (Signed)
Please advise ok to rx

## 2011-10-18 NOTE — Telephone Encounter (Signed)
ok 

## 2011-10-18 NOTE — Telephone Encounter (Signed)
Pt needs new rx tramadol 50 mg one tablet every 6 hrs as needed for pain.  call into [facility]. This rx was originally prescribed by PA at [facility].

## 2011-10-22 ENCOUNTER — Ambulatory Visit (INDEPENDENT_AMBULATORY_CARE_PROVIDER_SITE_OTHER): Payer: Medicare Other | Admitting: Endocrinology

## 2011-10-22 ENCOUNTER — Encounter: Payer: Self-pay | Admitting: Endocrinology

## 2011-10-22 VITALS — BP 130/78 | HR 73 | Temp 97.8°F | Ht 65.0 in | Wt 123.0 lb

## 2011-10-22 DIAGNOSIS — E0590 Thyrotoxicosis, unspecified without thyrotoxic crisis or storm: Secondary | ICD-10-CM

## 2011-10-22 MED ORDER — METHIMAZOLE 10 MG PO TABS: 10.0000 mg | ORAL_TABLET | Freq: Two times a day (BID) | ORAL | Status: DC

## 2011-10-22 NOTE — Progress Notes (Signed)
Subjective:    Patient ID: [name], female    DOB: 08-26-1922, 78 y.o.   MRN: [id number]  HPI Pt was recently in the hospital for a few week of vertigenous-quality dizziness, in the context of standing upright, but no assoc LOC.  She was noted to have suppressed TSH.   Past Medical History  Diagnosis Date  . ANEMIA, PERNICIOUS 09/10/2006  . CORONARY ARTERY DISEASE 11/07/2008  . CVA WITH LEFT HEMIPARESIS 07/22/2007    May, 2009, neurology decided aspirin and Plavix at that time.  . DERMATITIS 03/02/2007  . HYPERTENSION 07/15/2006  . MENINGIOMA 08/27/2006    Occipital meningioma, surgery, [facility], 2008 /  MRI, [place], June, 2011, no change in lesion, history believe the patient had a gamma knife treatment of a right occipital atypical meningioma  . OSTEOARTHRITIS 07/15/2006  . PULMONARY NODULE 01/14/2007    [place], December, 2011  . Takotsubo syndrome 04/22/2008    MI, May, 2008, question takotsubo event  . THYROID NODULE 01/14/2007  . IBS (irritable bowel syndrome)   . DJD (degenerative joint disease)   . Ejection fraction     EF 60%, echo, July, 2010, (  EF improved after tach a suitable event 2008)  . Subarachnoid hemorrhage following injury 2007    Subarachnoid hemorrhage secondary to a fall December, 2007  . Bradycardia     August, 2013  . Stroke   . Family history of anesthesia complication     daughter has a hard time waking up  . Myocardial infarction   . Shortness of breath   . H/O hiatal hernia   . UTI (lower urinary tract infection) 10/14/2011    Past Surgical History  Procedure Date  . Cholecystectomy   . Abdominal hysterectomy   . Hip surgery   . Brain tumor excision     Meningioma; treated at [facility] in 2008  . Shoulder surgery   . Joint replacement     rt THR    History   Social History  . Marital Status: Widowed    Spouse Name: N/A    Number of Children: 4  . Years of Education: N/A   Occupational History  .     Social History Main Topics  .  Smoking status: Never Smoker   . Smokeless tobacco: Never Used  . Alcohol Use: No  . Drug Use: No  . Sexually Active: No   Other Topics Concern  . Not on file   Social History Narrative   Widowed. Lives in [place], [place] with daughter.     Current Outpatient Prescriptions on File Prior to Visit  Medication Sig Dispense Refill  . bethanechol (URECHOLINE) 10 MG tablet Take 10-20 mg by mouth 4 (four) times daily as needed. For urinary retention.      . clopidogrel (PLAVIX) 75 MG tablet Take 75 mg by mouth daily.      Marland Kitchen conjugated estrogens (PREMARIN) vaginal cream Place 1 g vaginally daily as needed. For hot flashes.      . cyanocobalamin (,VITAMIN B-12,) 1000 MCG/ML injection Inject 1,000 mcg into the muscle every 30 (thirty) days.      Marland Kitchen lidocaine (LIDODERM) 5 % Place 1 patch onto the skin daily as needed. Remove & Discard patch within 12 hours or as directed by MD. For pain.      . meclizine (ANTIVERT) 50 MG tablet Take 1 tablet (50 mg total) by mouth 3 (three) times daily as needed for dizziness or nausea.  30 tablet  0  .  nitrofurantoin, macrocrystal-monohydrate, (MACROBID) 100 MG capsule Take 100 mg by mouth 2 (two) times daily.      . nitroGLYCERIN (NITROSTAT) 0.4 MG SL tablet Place 0.4 mg under the tongue every 5 (five) minutes as needed. For chest pain.      . pravastatin (PRAVACHOL) 20 MG tablet Take 20 mg by mouth every evening.      . traMADol (ULTRAM) 50 MG tablet Take 1 tablet (50 mg total) by mouth every 6 (six) hours as needed. For pain.  60 tablet  1  . triamcinolone ointment (KENALOG) 0.1 % Apply 1 application topically 2 (two) times daily as needed. For dry skin.        Allergies  Allergen Reactions  . Atorvastatin Other (See Comments)    Raises liver enzymes  . Phenytoin Other (See Comments)    Blood clots  . Promethazine Hcl Other (See Comments)    Severe sedation  . Sulfacetamide Sodium     REACTION: sulfa  . Sulfamethoxazole     REACTION: unspecified     Family History  Problem Relation Age of Onset  . Heart attack Brother 49  dtr had a large (benign) goiter  BP 130/78  Pulse 73  Temp 97.8 F (36.6 C) (Oral)  Ht 5\' 5"  (1.651 m)  Wt 123 lb (55.792 kg)  BMI 20.47 kg/m2  SpO2 96%  Review of Systems denies headache, hoarseness, double vision, palpitations, diarrhea, polyuria, myalgias, numbness, anxiety, menopausal sxs, and rhinorrhea.  She has lost weight.  She has doe, easy bruising, tremor, and excessive diaphoresis.      Objective:   Physical Exam VS: see vs page GEN: no distress HEAD: head: no deformity eyes: no periorbital swelling, no proptosis external nose and ears are normal mouth: no lesion seen NECK: ? Of a 2 cm left thyroid nodule CHEST WALL: no deformity LUNGS:  Clear to auscultation CV: reg rate and rhythm, no murmur ABD: abdomen is soft, nontender.  no hepatosplenomegaly.  not distended.  no hernia.   MUSCULOSKELETAL: muscle bulk and strength are grossly normal.  no obvious joint swelling.  gait is slow but steady EXTEMITIES: no deformity, except for oa changes. no edema.   PULSES: dorsalis pedis intact bilat.  no carotid bruit NEURO:  cn 2-12 grossly intact.   readily moves all 4's.  sensation is intact to touch on all 4's.  No tremor SKIN:  Normal texture and temperature.  No rash or suspicious lesion is visible.   NODES:  None palpable at the neck PSYCH: alert, oriented x3.  Does not appear anxious nor depressed.  Lab Results  Component Value Date   TSH 0.052* 10/14/2011      Assessment & Plan:  Nodular goiter, which is usually hereditary Hyperthyroidism, du eot the nodules Dizziness, ? If related to the hyperthyroidism abd pain.  Because of recent CT with contrast, she can't take i-131 now

## 2011-10-22 NOTE — Patient Instructions (Signed)
i have sent a prescription to your pharmacy, for a pill to slow-down the thyroid. if ever you have fever while taking methimazole, stop it and call us, because of the risk of a rare side-effect Please come back for a follow-up appointment in 3 weeks. We can revisit the radioactive iodine pill in the future, if you wish.  Or, we can continue the methimazole indefinitely.

## 2011-10-28 ENCOUNTER — Telehealth: Payer: Self-pay | Admitting: Internal Medicine

## 2011-10-28 NOTE — Telephone Encounter (Signed)
Caller: Sherrye Payor; Patient Name: [name]; PCP: Romero Belling (Adults only); Best Callback Phone Number: [phone]; Reason for call: Seems to have gotten more weak since beginning thyroid medication. Reports chest pain on Saturday 10/26/11 after taking Tramadol. Denies chest pain today. Was seen in office on 10/22/11, doctor aware of weakness. Reports shortness of breath when walking and severe weakness and fatigue. Reports she is getting more weak instead of stronger like the doctor reported she would. Emergent symptom of "New or worsening numbness, weakness or tingling of the hand/fingers aggravated by repetitive use and relieved by shaking hand and not previously evaluated or not improving after 7 days of treatment" positive per Weakness or Paralysis guideline. Disposition: See provider within 72 hours. Appointment scheduled with Dr. Everardo All 10/29/11 at 11:00am. Care advice and call back parameters given per guideline. Caller verbalized understanding.

## 2011-10-29 ENCOUNTER — Ambulatory Visit (INDEPENDENT_AMBULATORY_CARE_PROVIDER_SITE_OTHER): Payer: Medicare Other | Admitting: Endocrinology

## 2011-10-29 ENCOUNTER — Encounter: Payer: Self-pay | Admitting: Endocrinology

## 2011-10-29 ENCOUNTER — Other Ambulatory Visit (INDEPENDENT_AMBULATORY_CARE_PROVIDER_SITE_OTHER): Payer: Medicare Other

## 2011-10-29 VITALS — BP 126/78 | HR 78 | Temp 96.7°F | Wt 120.0 lb

## 2011-10-29 DIAGNOSIS — E0590 Thyrotoxicosis, unspecified without thyrotoxic crisis or storm: Secondary | ICD-10-CM

## 2011-10-29 DIAGNOSIS — Z79899 Other long term (current) drug therapy: Secondary | ICD-10-CM | POA: Insufficient documentation

## 2011-10-29 DIAGNOSIS — R079 Chest pain, unspecified: Secondary | ICD-10-CM

## 2011-10-29 LAB — CBC WITH DIFFERENTIAL/PLATELET
Basophils Absolute: 0 10*3/uL (ref 0.0–0.1)
Eosinophils Absolute: 0.1 10*3/uL (ref 0.0–0.7)
Hemoglobin: 12.7 g/dL (ref 12.0–15.0)
Lymphocytes Relative: 15.8 % (ref 12.0–46.0)
MCHC: 32.2 g/dL (ref 30.0–36.0)
MCV: 91.8 fl (ref 78.0–100.0)
Monocytes Absolute: 0.9 10*3/uL (ref 0.1–1.0)
Neutro Abs: 5.5 10*3/uL (ref 1.4–7.7)
RDW: 15 % — ABNORMAL HIGH (ref 11.5–14.6)

## 2011-10-29 NOTE — Patient Instructions (Addendum)
Please continue the same methimazole blood tests are being requested for you today.  You will receive a letter with results. Please see dr Amador Cunas next week as scheduled.   If you have the chest-pain again, go to the hospital.

## 2011-10-29 NOTE — Progress Notes (Signed)
Subjective:    Patient ID: [name], female    DOB: 1922-11-20, 78 y.o.   MRN: [id number]  HPI Pt was started on tapazole last week, for hyperthyroidism.  4 days ago, she had an episode of chest pain, not in exertional context.  sxs resolved after she took sl-ntg.  She has associated generalized weakness.   Past Medical History  Diagnosis Date  . ANEMIA, PERNICIOUS 09/10/2006  . CORONARY ARTERY DISEASE 11/07/2008  . CVA WITH LEFT HEMIPARESIS 07/22/2007    May, 2009, neurology decided aspirin and Plavix at that time.  . DERMATITIS 03/02/2007  . HYPERTENSION 07/15/2006  . MENINGIOMA 08/27/2006    Occipital meningioma, surgery, [facility], 2008 /  MRI, [place], June, 2011, no change in lesion, history believe the patient had a gamma knife treatment of a right occipital atypical meningioma  . OSTEOARTHRITIS 07/15/2006  . PULMONARY NODULE 01/14/2007    [place], December, 2011  . Takotsubo syndrome 04/22/2008    MI, May, 2008, question takotsubo event  . THYROID NODULE 01/14/2007  . IBS (irritable bowel syndrome)   . DJD (degenerative joint disease)   . Ejection fraction     EF 60%, echo, July, 2010, (  EF improved after tach a suitable event 2008)  . Subarachnoid hemorrhage following injury 2007    Subarachnoid hemorrhage secondary to a fall December, 2007  . Bradycardia     August, 2013  . Stroke   . Family history of anesthesia complication     daughter has a hard time waking up  . Myocardial infarction   . Shortness of breath   . H/O hiatal hernia   . UTI (lower urinary tract infection) 10/14/2011    Past Surgical History  Procedure Date  . Cholecystectomy   . Abdominal hysterectomy   . Hip surgery   . Brain tumor excision     Meningioma; treated at [facility] in 2008  . Shoulder surgery   . Joint replacement     rt THR    History   Social History  . Marital Status: Widowed    Spouse Name: N/A    Number of Children: 4  . Years of Education: N/A   Occupational History  .      Social History Main Topics  . Smoking status: Never Smoker   . Smokeless tobacco: Never Used  . Alcohol Use: No  . Drug Use: No  . Sexually Active: No   Other Topics Concern  . Not on file   Social History Narrative   Widowed. Lives in [place], [place] with daughter.     Current Outpatient Prescriptions on File Prior to Visit  Medication Sig Dispense Refill  . bethanechol (URECHOLINE) 10 MG tablet Take 10-20 mg by mouth 4 (four) times daily as needed. For urinary retention.      . clopidogrel (PLAVIX) 75 MG tablet Take 75 mg by mouth daily.      Marland Kitchen conjugated estrogens (PREMARIN) vaginal cream Place 1 g vaginally daily as needed. For hot flashes.      . cyanocobalamin (,VITAMIN B-12,) 1000 MCG/ML injection Inject 1,000 mcg into the muscle every 30 (thirty) days.      Marland Kitchen lidocaine (LIDODERM) 5 % Place 1 patch onto the skin daily as needed. Remove & Discard patch within 12 hours or as directed by MD. For pain.      . methimazole (TAPAZOLE) 10 MG tablet Take 1 tablet (10 mg total) by mouth 2 (two) times daily.  60 tablet  1  .  nitrofurantoin, macrocrystal-monohydrate, (MACROBID) 100 MG capsule Take 100 mg by mouth 2 (two) times daily.      . nitroGLYCERIN (NITROSTAT) 0.4 MG SL tablet Place 0.4 mg under the tongue every 5 (five) minutes as needed. For chest pain.      . pravastatin (PRAVACHOL) 20 MG tablet Take 20 mg by mouth every evening.      . traMADol (ULTRAM) 50 MG tablet Take 1 tablet (50 mg total) by mouth every 6 (six) hours as needed. For pain.  60 tablet  1  . triamcinolone ointment (KENALOG) 0.1 % Apply 1 application topically 2 (two) times daily as needed. For dry skin.        Allergies  Allergen Reactions  . Atorvastatin Other (See Comments)    Raises liver enzymes  . Phenytoin Other (See Comments)    Blood clots  . Promethazine Hcl Other (See Comments)    Severe sedation  . Sulfacetamide Sodium     REACTION: sulfa  . Sulfamethoxazole     REACTION: unspecified     Family History  Problem Relation Age of Onset  . Heart attack Brother 84    BP 126/78  Pulse 78  Temp 96.7 F (35.9 C) (Oral)  Wt 120 lb (54.432 kg)  SpO2 98%  Review of Systems Denies fever and n/v    Objective:   Physical Exam Vital signs: see vs page Gen: elderly, frail, no distress LUNGS:  Clear to auscultation HEART:  Regular rate and rhythm without murmurs noted. Normal S1,S2.    Lab Results  Component Value Date   WBC 7.8 10/29/2011   HGB 12.7 10/29/2011   HCT 39.5 10/29/2011   MCV 91.8 10/29/2011   PLT 249.0 10/29/2011   Lab Results  Component Value Date   TSH 0.00 Repeated and verified X2.* 10/29/2011   i reviewed electrocardiogram (no change)    Assessment & Plan:  Hyperthyroidism, unchanged Generalized weakness, uncertain etiology, new Episode of chest pain, uncertain etiology

## 2011-10-30 LAB — T4, FREE: Free T4: 1.74 ng/dL — ABNORMAL HIGH (ref 0.60–1.60)

## 2011-10-31 DIAGNOSIS — M6281 Muscle weakness (generalized): Secondary | ICD-10-CM

## 2011-10-31 DIAGNOSIS — R42 Dizziness and giddiness: Secondary | ICD-10-CM

## 2011-10-31 DIAGNOSIS — IMO0001 Reserved for inherently not codable concepts without codable children: Secondary | ICD-10-CM

## 2011-10-31 DIAGNOSIS — D510 Vitamin B12 deficiency anemia due to intrinsic factor deficiency: Secondary | ICD-10-CM

## 2011-11-05 ENCOUNTER — Ambulatory Visit (INDEPENDENT_AMBULATORY_CARE_PROVIDER_SITE_OTHER): Payer: Medicare Other | Admitting: Internal Medicine

## 2011-11-05 ENCOUNTER — Encounter: Payer: Self-pay | Admitting: Internal Medicine

## 2011-11-05 VITALS — BP 110/80 | Temp 97.5°F | Wt 121.0 lb

## 2011-11-05 DIAGNOSIS — E041 Nontoxic single thyroid nodule: Secondary | ICD-10-CM

## 2011-11-05 DIAGNOSIS — I2510 Atherosclerotic heart disease of native coronary artery without angina pectoris: Secondary | ICD-10-CM

## 2011-11-05 DIAGNOSIS — D510 Vitamin B12 deficiency anemia due to intrinsic factor deficiency: Secondary | ICD-10-CM

## 2011-11-05 DIAGNOSIS — I10 Essential (primary) hypertension: Secondary | ICD-10-CM

## 2011-11-05 DIAGNOSIS — E0590 Thyrotoxicosis, unspecified without thyrotoxic crisis or storm: Secondary | ICD-10-CM

## 2011-11-05 MED ORDER — CYANOCOBALAMIN 1000 MCG/ML IJ SOLN
1000.0000 ug | Freq: Once | INTRAMUSCULAR | Status: AC
  Administered 2011-11-05: 1000 ug via INTRAMUSCULAR

## 2011-11-05 NOTE — Patient Instructions (Signed)
Limit your sodium (Salt) intake    It is important that you exercise regularly, at least 20 minutes 3 to 4 times per week.  If you develop chest pain or shortness of breath seek  medical attention.  Follow with Dr. Everardo All  Return in 4 months for follow-up

## 2011-11-05 NOTE — Progress Notes (Signed)
Subjective:    Patient ID: [name], female    DOB: 1922/10/11, 78 y.o.   MRN: [id number]  HPI  78 year old patient who is seen today in followup. She was seen here one month ago complaining of weakness and weight loss. She subsequently presented to the hospital and noted to have a markedly depressed TSH. She has now been followed by endocrinology for hyperthyroidism and is on Tapazole 10 mg once daily.  She has persistent weakness poor appetite but hopefully her weight has stabilized  Wt Readings from Last 3 Encounters:  11/05/11 121 lb (54.885 kg)  10/29/11 120 lb (54.432 kg)  10/22/11 123 lb (55.792 kg)   Past Medical History  Diagnosis Date  . ANEMIA, PERNICIOUS 09/10/2006  . CORONARY ARTERY DISEASE 11/07/2008  . CVA WITH LEFT HEMIPARESIS 07/22/2007    May, 2009, neurology decided aspirin and Plavix at that time.  . DERMATITIS 03/02/2007  . HYPERTENSION 07/15/2006  . MENINGIOMA 08/27/2006    Occipital meningioma, surgery, [facility], 2008 /  MRI, [place], June, 2011, no change in lesion, history believe the patient had a gamma knife treatment of a right occipital atypical meningioma  . OSTEOARTHRITIS 07/15/2006  . PULMONARY NODULE 01/14/2007    [place], December, 2011  . Takotsubo syndrome 04/22/2008    MI, May, 2008, question takotsubo event  . THYROID NODULE 01/14/2007  . IBS (irritable bowel syndrome)   . DJD (degenerative joint disease)   . Ejection fraction     EF 60%, echo, July, 2010, (  EF improved after tach a suitable event 2008)  . Subarachnoid hemorrhage following injury 2007    Subarachnoid hemorrhage secondary to a fall December, 2007  . Bradycardia     August, 2013  . Stroke   . Family history of anesthesia complication     daughter has a hard time waking up  . Myocardial infarction   . Shortness of breath   . H/O hiatal hernia   . UTI (lower urinary tract infection) 10/14/2011    History   Social History  . Marital Status: Widowed    Spouse Name: N/A   Number of Children: 4  . Years of Education: N/A   Occupational History  .     Social History Main Topics  . Smoking status: Never Smoker   . Smokeless tobacco: Never Used  . Alcohol Use: No  . Drug Use: No  . Sexually Active: No   Other Topics Concern  . Not on file   Social History Narrative   Widowed. Lives in [place], [place] with daughter.     Past Surgical History  Procedure Date  . Cholecystectomy   . Abdominal hysterectomy   . Hip surgery   . Brain tumor excision     Meningioma; treated at [facility] in 2008  . Shoulder surgery   . Joint replacement     rt THR    Family History  Problem Relation Age of Onset  . Heart attack Brother 84    Allergies  Allergen Reactions  . Atorvastatin Other (See Comments)    Raises liver enzymes  . Phenytoin Other (See Comments)    Blood clots  . Promethazine Hcl Other (See Comments)    Severe sedation  . Sulfacetamide Sodium     REACTION: sulfa  . Sulfamethoxazole     REACTION: unspecified    Current Outpatient Prescriptions on File Prior to Visit  Medication Sig Dispense Refill  . bethanechol (URECHOLINE) 10 MG tablet Take 10-20 mg by mouth 4 (  four) times daily as needed. For urinary retention.      . clopidogrel (PLAVIX) 75 MG tablet Take 75 mg by mouth daily.      Marland Kitchen conjugated estrogens (PREMARIN) vaginal cream Place 1 g vaginally daily as needed. For hot flashes.      . cyanocobalamin (,VITAMIN B-12,) 1000 MCG/ML injection Inject 1,000 mcg into the muscle every 30 (thirty) days.      Marland Kitchen lidocaine (LIDODERM) 5 % Place 1 patch onto the skin daily as needed. Remove & Discard patch within 12 hours or as directed by MD. For pain.      . methimazole (TAPAZOLE) 10 MG tablet Take 1 tablet (10 mg total) by mouth 2 (two) times daily.  60 tablet  1  . nitrofurantoin, macrocrystal-monohydrate, (MACROBID) 100 MG capsule Take 100 mg by mouth 2 (two) times daily.      . nitroGLYCERIN (NITROSTAT) 0.4 MG SL tablet Place 0.4 mg under  the tongue every 5 (five) minutes as needed. For chest pain.      . pravastatin (PRAVACHOL) 20 MG tablet Take 20 mg by mouth every evening.      . traMADol (ULTRAM) 50 MG tablet Take 1 tablet (50 mg total) by mouth every 6 (six) hours as needed. For pain.  60 tablet  1  . triamcinolone ointment (KENALOG) 0.1 % Apply 1 application topically 2 (two) times daily as needed. For dry skin.       No current facility-administered medications on file prior to visit.    BP 110/80  Temp 97.5 F (36.4 C) (Oral)  Wt 121 lb (54.885 kg)    Review of Systems  Constitutional: Positive for fatigue.  HENT: Negative for hearing loss, congestion, sore throat, rhinorrhea, dental problem, sinus pressure and tinnitus.   Eyes: Negative for pain, discharge and visual disturbance.  Respiratory: Negative for cough and shortness of breath.   Cardiovascular: Negative for chest pain, palpitations and leg swelling.  Gastrointestinal: Negative for nausea, vomiting, abdominal pain, diarrhea, constipation, blood in stool and abdominal distention.  Genitourinary: Negative for dysuria, urgency, frequency, hematuria, flank pain, vaginal bleeding, vaginal discharge, difficulty urinating, vaginal pain and pelvic pain.  Musculoskeletal: Negative for joint swelling, arthralgias and gait problem.  Skin: Negative for rash.  Neurological: Positive for tremors and weakness. Negative for dizziness, syncope, speech difficulty, numbness and headaches.  Hematological: Negative for adenopathy.  Psychiatric/Behavioral: Negative for behavioral problems, dysphoric mood and agitation. The patient is not nervous/anxious.        Objective:   Physical Exam  Constitutional: She appears well-developed and well-nourished. No distress.       Blood pressure remains low normal without tachycardia Weight 121  Neurological:        tremor of the outstretched hands as well as hyperreflexia          Assessment & Plan:   Hyperthyroidism.  Continue endocrine followup and antithyroid drug therapy Coronary artery disease stable Hypertension blood pressure remains low normal pulse also remains normal; we'll continue to hold Coreg

## 2011-11-12 ENCOUNTER — Encounter: Payer: Self-pay | Admitting: Endocrinology

## 2011-11-12 ENCOUNTER — Ambulatory Visit (INDEPENDENT_AMBULATORY_CARE_PROVIDER_SITE_OTHER): Payer: Medicare Other | Admitting: Endocrinology

## 2011-11-12 VITALS — BP 124/80 | Temp 97.5°F | Wt 122.0 lb

## 2011-11-12 DIAGNOSIS — E0590 Thyrotoxicosis, unspecified without thyrotoxic crisis or storm: Secondary | ICD-10-CM

## 2011-11-12 NOTE — Patient Instructions (Addendum)
blood tests are being requested for you today.  You will be contacted with results. Please come back for a follow-up appointment for 1 month. if ever you have fever while taking methimazole, stop it and call us, because of the risk of a rare side-effect

## 2011-11-12 NOTE — Progress Notes (Signed)
Subjective:    Patient ID: [name], female    DOB: Oct 24, 1922, 78 y.o.   MRN: [id number]  HPI Pt was noted in the hospital in sept, 2013, to have hyperthyroidism.  She was started on tapazole.  She can tolerate only 10 mg of tapazole per day, due to GI sxs.  Since on the tapazole, she feels no different.  Her main symptom is generalized weakness. Past Medical History  Diagnosis Date  . ANEMIA, PERNICIOUS 09/10/2006  . CORONARY ARTERY DISEASE 11/07/2008  . CVA WITH LEFT HEMIPARESIS 07/22/2007    May, 2009, neurology decided aspirin and Plavix at that time.  . DERMATITIS 03/02/2007  . HYPERTENSION 07/15/2006  . MENINGIOMA 08/27/2006    Occipital meningioma, surgery, [facility], 2008 /  MRI, [place], June, 2011, no change in lesion, history believe the patient had a gamma knife treatment of a right occipital atypical meningioma  . OSTEOARTHRITIS 07/15/2006  . PULMONARY NODULE 01/14/2007    [place], December, 2011  . Takotsubo syndrome 04/22/2008    MI, May, 2008, question takotsubo event  . THYROID NODULE 01/14/2007  . IBS (irritable bowel syndrome)   . DJD (degenerative joint disease)   . Ejection fraction     EF 60%, echo, July, 2010, (  EF improved after tach a suitable event 2008)  . Subarachnoid hemorrhage following injury 2007    Subarachnoid hemorrhage secondary to a fall December, 2007  . Bradycardia     August, 2013  . Stroke   . Family history of anesthesia complication     daughter has a hard time waking up  . Myocardial infarction   . Shortness of breath   . H/O hiatal hernia   . UTI (lower urinary tract infection) 10/14/2011    Past Surgical History  Procedure Date  . Cholecystectomy   . Abdominal hysterectomy   . Hip surgery   . Brain tumor excision     Meningioma; treated at [facility] in 2008  . Shoulder surgery   . Joint replacement     rt THR    History   Social History  . Marital Status: Widowed    Spouse Name: N/A    Number of Children: 4  . Years of  Education: N/A   Occupational History  .     Social History Main Topics  . Smoking status: Never Smoker   . Smokeless tobacco: Never Used  . Alcohol Use: No  . Drug Use: No  . Sexually Active: No   Other Topics Concern  . Not on file   Social History Narrative   Widowed. Lives in [place], [place] with daughter.     Current Outpatient Prescriptions on File Prior to Visit  Medication Sig Dispense Refill  . bethanechol (URECHOLINE) 10 MG tablet Take 10-20 mg by mouth 4 (four) times daily as needed. For urinary retention.      . clopidogrel (PLAVIX) 75 MG tablet Take 75 mg by mouth daily.      Marland Kitchen conjugated estrogens (PREMARIN) vaginal cream Place 1 g vaginally daily as needed. For hot flashes.      . cyanocobalamin (,VITAMIN B-12,) 1000 MCG/ML injection Inject 1,000 mcg into the muscle every 30 (thirty) days.      Marland Kitchen lidocaine (LIDODERM) 5 % Place 1 patch onto the skin daily as needed. Remove & Discard patch within 12 hours or as directed by MD. For pain.      . methimazole (TAPAZOLE) 10 MG tablet Take 1 tablet (10 mg total) by mouth  2 (two) times daily.  60 tablet  1  . nitrofurantoin, macrocrystal-monohydrate, (MACROBID) 100 MG capsule Take 100 mg by mouth 2 (two) times daily.      . nitroGLYCERIN (NITROSTAT) 0.4 MG SL tablet Place 0.4 mg under the tongue every 5 (five) minutes as needed. For chest pain.      . pravastatin (PRAVACHOL) 20 MG tablet Take 20 mg by mouth every evening.      . traMADol (ULTRAM) 50 MG tablet Take 1 tablet (50 mg total) by mouth every 6 (six) hours as needed. For pain.  60 tablet  1  . triamcinolone ointment (KENALOG) 0.1 % Apply 1 application topically 2 (two) times daily as needed. For dry skin.        Allergies  Allergen Reactions  . Atorvastatin Other (See Comments)    Raises liver enzymes  . Phenytoin Other (See Comments)    Blood clots  . Promethazine Hcl Other (See Comments)    Severe sedation  . Sulfacetamide Sodium     REACTION: sulfa  .  Sulfamethoxazole     REACTION: unspecified    Family History  Problem Relation Age of Onset  . Heart attack Brother 84    BP 124/80  Temp 97.5 F (36.4 C) (Oral)  Wt 122 lb (55.339 kg)   Review of Systems Denies fever.      Objective:   Physical Exam Vital signs: see vs page Gen: elderly, frail, no distress Neck; ? Of left thyroid nodule   Lab Results  Component Value Date   TSH 0.023* 11/12/2011      Assessment & Plan:  Hyperthyroidism, therapy is limited by drug intolerance.  needs increased rx

## 2011-11-16 ENCOUNTER — Emergency Department (HOSPITAL_COMMUNITY): Payer: Medicare Other

## 2011-11-16 ENCOUNTER — Observation Stay (HOSPITAL_COMMUNITY)
Admission: EM | Admit: 2011-11-16 | Discharge: 2011-11-19 | Disposition: A | Discharge status: Home / Self Care | Payer: Medicare Other | Attending: Internal Medicine | Admitting: Internal Medicine

## 2011-11-16 DIAGNOSIS — I5181 Takotsubo syndrome: Principal | ICD-10-CM | POA: Insufficient documentation

## 2011-11-16 DIAGNOSIS — K589 Irritable bowel syndrome without diarrhea: Secondary | ICD-10-CM | POA: Insufficient documentation

## 2011-11-16 DIAGNOSIS — R0989 Other specified symptoms and signs involving the circulatory and respiratory systems: Secondary | ICD-10-CM | POA: Insufficient documentation

## 2011-11-16 DIAGNOSIS — Z7902 Long term (current) use of antithrombotics/antiplatelets: Secondary | ICD-10-CM | POA: Insufficient documentation

## 2011-11-16 DIAGNOSIS — R0600 Dyspnea, unspecified: Secondary | ICD-10-CM

## 2011-11-16 DIAGNOSIS — R0789 Other chest pain: Secondary | ICD-10-CM

## 2011-11-16 DIAGNOSIS — I252 Old myocardial infarction: Secondary | ICD-10-CM | POA: Insufficient documentation

## 2011-11-16 DIAGNOSIS — M1990 Unspecified osteoarthritis, unspecified site: Secondary | ICD-10-CM | POA: Insufficient documentation

## 2011-11-16 DIAGNOSIS — Z96649 Presence of unspecified artificial hip joint: Secondary | ICD-10-CM | POA: Insufficient documentation

## 2011-11-16 DIAGNOSIS — I2510 Atherosclerotic heart disease of native coronary artery without angina pectoris: Secondary | ICD-10-CM | POA: Insufficient documentation

## 2011-11-16 DIAGNOSIS — K219 Gastro-esophageal reflux disease without esophagitis: Secondary | ICD-10-CM | POA: Diagnosis present

## 2011-11-16 DIAGNOSIS — R42 Dizziness and giddiness: Secondary | ICD-10-CM | POA: Diagnosis present

## 2011-11-16 DIAGNOSIS — R0609 Other forms of dyspnea: Secondary | ICD-10-CM | POA: Insufficient documentation

## 2011-11-16 DIAGNOSIS — D510 Vitamin B12 deficiency anemia due to intrinsic factor deficiency: Secondary | ICD-10-CM | POA: Insufficient documentation

## 2011-11-16 DIAGNOSIS — I69959 Hemiplegia and hemiparesis following unspecified cerebrovascular disease affecting unspecified side: Secondary | ICD-10-CM | POA: Insufficient documentation

## 2011-11-16 DIAGNOSIS — I10 Essential (primary) hypertension: Secondary | ICD-10-CM | POA: Insufficient documentation

## 2011-11-16 DIAGNOSIS — K449 Diaphragmatic hernia without obstruction or gangrene: Secondary | ICD-10-CM | POA: Insufficient documentation

## 2011-11-16 DIAGNOSIS — R002 Palpitations: Secondary | ICD-10-CM | POA: Insufficient documentation

## 2011-11-16 DIAGNOSIS — E0590 Thyrotoxicosis, unspecified without thyrotoxic crisis or storm: Secondary | ICD-10-CM

## 2011-11-16 DIAGNOSIS — R079 Chest pain, unspecified: Secondary | ICD-10-CM | POA: Insufficient documentation

## 2011-11-16 DIAGNOSIS — R1310 Dysphagia, unspecified: Secondary | ICD-10-CM | POA: Diagnosis present

## 2011-11-16 NOTE — ED Notes (Signed)
Pt having palpitations on and off for last month. Mild SOB with palpitations. Denies CP but does have mild L shoulder pain.

## 2011-11-16 NOTE — ED Notes (Signed)
Old and New EKG given to Dr Aubery Lapping.

## 2011-11-17 ENCOUNTER — Emergency Department (HOSPITAL_COMMUNITY): Payer: Medicare Other

## 2011-11-17 ENCOUNTER — Encounter (HOSPITAL_COMMUNITY): Payer: Self-pay | Admitting: Internal Medicine

## 2011-11-17 DIAGNOSIS — R002 Palpitations: Secondary | ICD-10-CM

## 2011-11-17 DIAGNOSIS — R42 Dizziness and giddiness: Secondary | ICD-10-CM

## 2011-11-17 DIAGNOSIS — R079 Chest pain, unspecified: Secondary | ICD-10-CM

## 2011-11-17 DIAGNOSIS — R0989 Other specified symptoms and signs involving the circulatory and respiratory systems: Secondary | ICD-10-CM

## 2011-11-17 DIAGNOSIS — R0600 Dyspnea, unspecified: Secondary | ICD-10-CM | POA: Insufficient documentation

## 2011-11-17 DIAGNOSIS — R0609 Other forms of dyspnea: Secondary | ICD-10-CM

## 2011-11-17 LAB — CBC
HCT: 33.8 % — ABNORMAL LOW (ref 36.0–46.0)
Hemoglobin: 11.3 g/dL — ABNORMAL LOW (ref 12.0–15.0)
MCH: 29.2 pg (ref 26.0–34.0)
MCH: 30.2 pg (ref 26.0–34.0)
MCHC: 33.1 g/dL (ref 30.0–36.0)
MCHC: 33.9 g/dL (ref 30.0–36.0)
MCV: 88 fL (ref 78.0–100.0)
Platelets: 159 10*3/uL (ref 150–400)
Platelets: 162 10*3/uL (ref 150–400)
RDW: 13.9 % (ref 11.5–15.5)
RDW: 14 % (ref 11.5–15.5)
WBC: 5.8 10*3/uL (ref 4.0–10.5)

## 2011-11-17 LAB — COMPREHENSIVE METABOLIC PANEL
ALT: 12 U/L (ref 0–35)
AST: 19 U/L (ref 0–37)
AST: 17 U/L (ref 0–37)
Albumin: 3.5 g/dL (ref 3.5–5.2)
Albumin: 3.5 g/dL (ref 3.5–5.2)
Alkaline Phosphatase: 89 U/L (ref 39–117)
BUN: 17 mg/dL (ref 6–23)
Calcium: 9.4 mg/dL (ref 8.4–10.5)
Chloride: 97 mEq/L (ref 96–112)
Chloride: 102 mEq/L (ref 96–112)
Creatinine, Ser: 0.9 mg/dL (ref 0.50–1.10)
Potassium: 4 mEq/L (ref 3.5–5.1)
Sodium: 137 mEq/L (ref 135–145)
Total Bilirubin: 0.2 mg/dL — ABNORMAL LOW (ref 0.3–1.2)
Total Bilirubin: 0.3 mg/dL (ref 0.3–1.2)
Total Protein: 6.2 g/dL (ref 6.0–8.3)

## 2011-11-17 LAB — POCT I-STAT TROPONIN I: Troponin i, poc: 0.01 ng/mL (ref 0.00–0.08)

## 2011-11-17 LAB — PRO B NATRIURETIC PEPTIDE: Pro B Natriuretic peptide (BNP): 545.1 pg/mL — ABNORMAL HIGH (ref 0–450)

## 2011-11-17 LAB — POCT I-STAT, CHEM 8
Calcium, Ion: 1.19 mmol/L (ref 1.13–1.30)
Chloride: 101 mEq/L (ref 96–112)
HCT: 34 % — ABNORMAL LOW (ref 36.0–46.0)
Potassium: 4.3 mEq/L (ref 3.5–5.1)

## 2011-11-17 LAB — TSH: TSH: 0.026 u[IU]/mL — ABNORMAL LOW (ref 0.350–4.500)

## 2011-11-17 LAB — D-DIMER, QUANTITATIVE: D-Dimer, Quant: 1.96 ug/mL-FEU — ABNORMAL HIGH (ref 0.00–0.48)

## 2011-11-17 MED ORDER — ENOXAPARIN SODIUM 30 MG/0.3ML ~~LOC~~ SOLN
30.0000 mg | SUBCUTANEOUS | Status: DC
  Administered 2011-11-17 – 2011-11-19 (×3): 30 mg via SUBCUTANEOUS
  Filled 2011-11-17 (×3): qty 0.3

## 2011-11-17 MED ORDER — METHIMAZOLE 10 MG PO TABS
10.0000 mg | ORAL_TABLET | Freq: Two times a day (BID) | ORAL | Status: DC
  Administered 2011-11-17 – 2011-11-19 (×5): 10 mg via ORAL
  Filled 2011-11-17 (×6): qty 1

## 2011-11-17 MED ORDER — HYDROCODONE-ACETAMINOPHEN 5-325 MG PO TABS: 1.0000 | ORAL_TABLET | ORAL | Status: DC | PRN

## 2011-11-17 MED ORDER — PRAVASTATIN SODIUM 20 MG PO TABS
20.0000 mg | ORAL_TABLET | Freq: Every day | ORAL | Status: DC
  Administered 2011-11-17 – 2011-11-18 (×2): 20 mg via ORAL
  Filled 2011-11-17 (×3): qty 1

## 2011-11-17 MED ORDER — ONDANSETRON HCL 4 MG PO TABS: 4.0000 mg | ORAL_TABLET | Freq: Four times a day (QID) | ORAL | Status: DC | PRN

## 2011-11-17 MED ORDER — SODIUM CHLORIDE 0.9 % IV SOLN
INTRAVENOUS | Status: AC
  Administered 2011-11-17: 07:00:00 via INTRAVENOUS

## 2011-11-17 MED ORDER — ALBUTEROL SULFATE (5 MG/ML) 0.5% IN NEBU: 2.5000 mg | INHALATION_SOLUTION | RESPIRATORY_TRACT | Status: DC | PRN

## 2011-11-17 MED ORDER — DOCUSATE SODIUM 100 MG PO CAPS
100.0000 mg | ORAL_CAPSULE | Freq: Two times a day (BID) | ORAL | Status: DC
  Administered 2011-11-17 – 2011-11-18 (×3): 100 mg via ORAL
  Filled 2011-11-17 (×6): qty 1

## 2011-11-17 MED ORDER — SODIUM CHLORIDE 0.9 % IJ SOLN
3.0000 mL | Freq: Two times a day (BID) | INTRAMUSCULAR | Status: DC
  Administered 2011-11-17 – 2011-11-19 (×4): 3 mL via INTRAVENOUS

## 2011-11-17 MED ORDER — GUAIFENESIN-DM 100-10 MG/5ML PO SYRP: 5.0000 mL | ORAL_SOLUTION | ORAL | Status: DC | PRN

## 2011-11-17 MED ORDER — ONDANSETRON HCL 4 MG/2ML IJ SOLN: 4.0000 mg | Freq: Four times a day (QID) | INTRAMUSCULAR | Status: DC | PRN

## 2011-11-17 MED ORDER — CLOPIDOGREL BISULFATE 75 MG PO TABS
75.0000 mg | ORAL_TABLET | Freq: Every day | ORAL | Status: DC
Start: 2011-11-17 — End: 2011-11-19
  Administered 2011-11-17 – 2011-11-19 (×3): 75 mg via ORAL
  Filled 2011-11-17 (×4): qty 1

## 2011-11-17 MED ORDER — IOHEXOL 350 MG/ML SOLN
100.0000 mL | Freq: Once | INTRAVENOUS | Status: AC | PRN
  Administered 2011-11-17: 100 mL via INTRAVENOUS

## 2011-11-17 MED ORDER — BETHANECHOL CHLORIDE 10 MG PO TABS: 10.0000 mg | ORAL_TABLET | Freq: Four times a day (QID) | ORAL | Status: DC | PRN

## 2011-11-17 MED ORDER — ACETAMINOPHEN 325 MG PO TABS
650.0000 mg | ORAL_TABLET | Freq: Four times a day (QID) | ORAL | Status: DC | PRN
  Administered 2011-11-17 – 2011-11-18 (×2): 650 mg via ORAL
  Filled 2011-11-17 (×2): qty 2

## 2011-11-17 MED ORDER — ACETAMINOPHEN 650 MG RE SUPP: 650.0000 mg | Freq: Four times a day (QID) | RECTAL | Status: DC | PRN

## 2011-11-17 MED ORDER — TRAMADOL HCL 50 MG PO TABS
50.0000 mg | ORAL_TABLET | Freq: Four times a day (QID) | ORAL | Status: DC | PRN
  Filled 2011-11-17: qty 1

## 2011-11-17 MED ORDER — ALUM & MAG HYDROXIDE-SIMETH 200-200-20 MG/5ML PO SUSP: 30.0000 mL | Freq: Four times a day (QID) | ORAL | Status: DC | PRN

## 2011-11-17 NOTE — Progress Notes (Signed)
  Echocardiogram 2D Echocardiogram has been performed.  Conn Trombetta 11/17/2011, 11:42 AM 

## 2011-11-17 NOTE — Progress Notes (Signed)
Patient seen and examined. Admission H&P from this morning reviewed. Labs stable except for low TSH normal free T4 level. Patient recently followed with her endocrinologist as outpatient about 5 days back. Adjustment of thyroid medication as per primary. Recent denies any chest pain at this time and is stable on telemetry. Serial cardiac enzymes are negative as well. Patient noted to be mildly orthostatic this morning. Continue with IV fluids -Reviewing previous notes she was recently seen by Dr. Myrtis Ser from about cardiology and given her previous cardiac workup it appears that her chest pain is musculoskeletal in nature. -Patient informs of feeling weak and family at bedside informs that she has poor appetite. -Ordered for a nutrition consult. - 2D. echo has been done. Pending results.

## 2011-11-17 NOTE — H&P (Signed)
PCP:   Rogelia Boga, MD    Chief Complaint:  Chest pain and palpitation  HPI: [name] is a 78 y.o. female   has a past medical history of ANEMIA, PERNICIOUS (09/10/2006); CORONARY ARTERY DISEASE (11/07/2008); CVA WITH LEFT HEMIPARESIS (07/22/2007); DERMATITIS (03/02/2007); HYPERTENSION (07/15/2006); MENINGIOMA (08/27/2006); OSTEOARTHRITIS (07/15/2006); PULMONARY NODULE (01/14/2007); Takotsubo syndrome (04/22/2008); THYROID NODULE (01/14/2007); IBS (irritable bowel syndrome); DJD (degenerative joint disease); Ejection fraction; Subarachnoid hemorrhage following injury (2007); Bradycardia; Stroke; Family history of anesthesia complication; Myocardial infarction; Shortness of breath; H/O hiatal hernia; and UTI (lower urinary tract infection) (10/14/2011).   Presented with  At 4 pm she was not feeling well feeling dizzy and heard to breath. She laid down on the couch and felt like she was gong to pass out. She had shortness of breath. CTA of the chest was negative for PE. She is normaly not on oxygen at home but was noted to be hypoxic. She have had hyperthyroidism recently and have been only taking half of her medication because of trouble tolerating it. She had some nausea but not vomiting and some diarrhea today.  She denies chest pain but endorses chest discomfort and palpitations. Patient also reports that for few months she have had pain in Left axilla and left breast tissue that felt like "hot Needle"  Review of Systems:   Pertinent positives include: fatigue, nausea, dizziness, palpitations.  Constitutional:  No weight loss, night sweats, Fevers, chills, weight loss  HEENT:  No headaches, Difficulty swallowing,Tooth/dental problems,Sore throat,  No sneezing, itching, ear ache, nasal congestion, post nasal drip,  Cardio-vascular:  No chest pain, Orthopnea, PND, anasarca, no Bilateral lower extremity swelling  GI:  No heartburn, indigestion, abdominal pain, vomiting, diarrhea,  change in bowel habits, loss of appetite, melena, blood in stool, hematemesis Resp:  no shortness of breath at rest. No dyspnea on exertion, No excess mucus, no productive cough, No non-productive cough, No coughing up of blood.No change in color of mucus.No wheezing. Skin:  no rash or lesions. No jaundice GU:  no dysuria, change in color of urine, no urgency or frequency. No straining to urinate.  No flank pain.  Musculoskeletal:  No joint pain or no joint swelling. No decreased range of motion. No back pain.  Psych:  No change in mood or affect. No depression or anxiety. No memory loss.  Neuro: no localizing neurological complaints, no tingling, no weakness, no double vision, no gait abnormality, no slurred speech, no confusion  Otherwise ROS are negative except for above, 10 systems were reviewed  Past Medical History: Past Medical History  Diagnosis Date  . ANEMIA, PERNICIOUS 09/10/2006  . CORONARY ARTERY DISEASE 11/07/2008  . CVA WITH LEFT HEMIPARESIS 07/22/2007    May, 2009, neurology decided aspirin and Plavix at that time.  . DERMATITIS 03/02/2007  . HYPERTENSION 07/15/2006  . MENINGIOMA 08/27/2006    Occipital meningioma, surgery, [facility], 2008 /  MRI, [place], June, 2011, no change in lesion, history believe the patient had a gamma knife treatment of a right occipital atypical meningioma  . OSTEOARTHRITIS 07/15/2006  . PULMONARY NODULE 01/14/2007    [facility], December, 2011  . Takotsubo syndrome 04/22/2008    MI, May, 2008, question takotsubo event  . THYROID NODULE 01/14/2007  . IBS (irritable bowel syndrome)   . DJD (degenerative joint disease)   . Ejection fraction     EF 60%, echo, July, 2010, (  EF improved after tach a suitable event 2008)  . Subarachnoid hemorrhage following injury 2007  Subarachnoid hemorrhage secondary to a fall December, 2007  . Bradycardia     August, 2013  . Stroke   . Family history of anesthesia complication     daughter has a hard time  waking up  . Myocardial infarction   . Shortness of breath   . H/O hiatal hernia   . UTI (lower urinary tract infection) 10/14/2011   Past Surgical History  Procedure Date  . Cholecystectomy   . Abdominal hysterectomy   . Hip surgery   . Brain tumor excision     Meningioma; [facility] at [facility] in 2008  . Shoulder surgery   . Joint replacement     rt THR     Medications: Prior to Admission medications   Medication Sig Start Date End Date Taking? Authorizing Provider  bethanechol (URECHOLINE) 10 MG tablet Take 10-20 mg by mouth 4 (four) times daily as needed. For urinary retention.   Yes Historical Provider, MD  clopidogrel (PLAVIX) 75 MG tablet Take 75 mg by mouth daily.   Yes Historical Provider, MD  conjugated estrogens (PREMARIN) vaginal cream Place 1 g vaginally daily as needed. For hot flashes.   Yes Historical Provider, MD  cyanocobalamin (,VITAMIN B-12,) 1000 MCG/ML injection Inject 1,000 mcg into the muscle every 30 (thirty) days.   Yes Historical Provider, MD  methimazole (TAPAZOLE) 10 MG tablet Take 1 tablet (10 mg total) by mouth 2 (two) times daily. 10/22/11  Yes Romero Belling, MD  nitroGLYCERIN (NITROSTAT) 0.4 MG SL tablet Place 0.4 mg under the tongue every 5 (five) minutes as needed. For chest pain.   Yes Historical Provider, MD  pravastatin (PRAVACHOL) 20 MG tablet Take 20 mg by mouth every evening. 09/25/10  Yes Gordy Savers, MD  traMADol (ULTRAM) 50 MG tablet Take 1 tablet (50 mg total) by mouth every 6 (six) hours as needed. For pain. 10/18/11  Yes Gordy Savers, MD  triamcinolone ointment (KENALOG) 0.1 % Apply 1 application topically 2 (two) times daily as needed. For dry skin.   Yes Historical Provider, MD    Allergies:   Allergies  Allergen Reactions  . Atorvastatin Other (See Comments)    Raises liver enzymes  . Phenytoin Other (See Comments)    Blood clots  . Promethazine Hcl Other (See Comments)    Severe sedation  . Sulfacetamide Sodium      REACTION: sulfa  . Sulfamethoxazole     REACTION: unspecified    Social History:  Ambulatory  independently  Lives at  Home with family   reports that she has never smoked. She has never used smokeless tobacco. She reports that she does not drink alcohol or use illicit drugs.   Family History: family history includes Heart attack (age of onset:84) in her brother.    Physical Exam: Patient Vitals for the past 24 hrs:  BP Temp Temp src Pulse Resp SpO2  11/17/11 0108 134/67 mmHg - - - 23  100 %  11/16/11 2334 126/58 mmHg 98.3 F (36.8 C) Oral 65  20  98 %    1. General:  in No Acute distress 2. Psychological: Alert and Oriented 3. Head/ENT:     Dry Mucous Membranes                          Head Non traumatic, neck supple  Normal   Dentition 4. SKIN:  decreased Skin turgor,  Skin clean Dry and intact no rash Breast exam left breast significant to mild tender nodularity at 3 o'clock.  Right breast in unremarkable.  5. Heart: Regular rate and rhythm no Murmur, Rub or gallop 6. Lungs: Clear to auscultation bilaterally, no wheezes or crackles   7. Abdomen: Soft, non-tender, Non distended 8. Lower extremities: no clubbing, cyanosis, or edema 9. Neurologically Grossly intact, moving all 4 extremities equally 10. MSK: Normal range of motion  body mass index is unknown because there is no height or weight on file.   Labs on Admission:   [facility] 11/17/11 0136  NA 132*  K 4.2  CL 97  CO2 26  GLUCOSE 104*  BUN 17  CREATININE 0.90  CALCIUM 9.4  MG --  PHOS --    Basename 11/17/11 0136  AST 19  ALT 13  ALKPHOS 92  BILITOT 0.2*  PROT 6.4  ALBUMIN 3.5   No results found for this basename: LIPASE:2,AMYLASE:2 in the last 72 hours  Basename 11/16/11 2336  WBC 7.8  NEUTROABS --  HGB 11.3*  HCT 33.3*  MCV 89.0  PLT 162   No results found for this basename: CKTOTAL:3,CKMB:3,CKMBINDEX:3,TROPONINI:3 in the last 72 hours No results found for  this basename: TSH,T4TOTAL,FREET3,T3FREE,THYROIDAB in the last 72 hours No results found for this basename: VITAMINB12:2,FOLATE:2,FERRITIN:2,TIBC:2,IRON:2,RETICCTPCT:2 in the last 72 hours Lab Results  Component Value Date   HGBA1C 5.9* 03/05/2011    The CrCl is unknown because both a height and weight (above a minimum accepted value) are required for this calculation. ABG    Component Value Date/Time   TCO2 23 10/14/2011 0620     Lab Results  Component Value Date   DDIMER 1.96* 11/17/2011     Other results:  I have pearsonaly reviewed this: ECG REPORT  Rate: 66  Rhythm: NSR ST&T Change: no ischemic changes    Cultures:    Component Value Date/Time   SDES URINE, RANDOM 09/15/2010 1831   SPECREQUEST IMMUNE:NORM UT SYMPT:NEG 09/15/2010 1831   CULT MULTIPLE ORGANISMS PRESENT, NONE PREDOMINANT 09/15/2010 1831   REPTSTATUS 09/17/2010 FINAL 09/15/2010 1831       Radiological Exams on Admission: Ct Angio Chest Pe W/cm &/or Wo Cm  11/17/2011  *RADIOLOGY REPORT*  Clinical Data: Shortness of breath, palpitations  CT ANGIOGRAPHY CHEST  Technique:  Multidetector CT imaging of the chest using the standard protocol during bolus administration of intravenous contrast. Multiplanar reconstructed images including MIPs were obtained and reviewed to evaluate the vascular anatomy.  Contrast: OMNIPAQUE IOHEXOL 350 MG/ML SOLN  Comparison: Chest radiograph dated 11/16/2011.  CT chest dated 08/15/2010.  Findings: No evidence of pulmonary embolism.  Stable bilobed patchy/nodular opacity in the right lung apex (series 5/image 22).  Additional stable ground-glass opacity in the posterior right upper lobe (series 5/image 29).  While this appearance is similar to prior studies, low grade adenocarcinoma remains possible.  Stable 4 mm nodule in the left upper lobe (series 5/image 37) and 2 mm nodule in the lingula (series 5/image 55), unchanged from 2011, benign.  Mild dependent atelectasis / subpleural  reticulation at the lung bases.  Biapical pleural parenchymal scarring.  Visualized thyroid is mildly enlarged/heterogeneous.  The heart is normal in size.  No pericardial effusion.  Coronary atherosclerosis.  Atherosclerotic calcifications of the aortic arch.  No suspicious mediastinal, hilar, or axillary lymphadenopathy.  Visualized upper abdomen is unremarkable.  Degenerative changes of the visualized thoracolumbar spine. Vertebral hemangiomas  at T6 and T12.  IMPRESSION: No evidence of pulmonary embolism.  Stable patchy/nodular and ground-glass opacities in the right upper lobe, as described above.  While grossly unchanged, low grade adenocarcinoma remains possible.   Original Report Authenticated By: Charline Bills, M.D.    Dg Chest Portable 1 View  11/16/2011  *RADIOLOGY REPORT*  Clinical Data: Shortness of breath, palpitations  PORTABLE CHEST - 1 VIEW  Comparison: 10/14/2011  Findings: Hyperinflation.  No focal consolidation. No pleural effusion or pneumothorax.  Mild cardiomegaly.  Aortic tortuosity.  IMPRESSION: No evidence of acute cardiopulmonary disease.   Original Report Authenticated By: Charline Bills, M.D.     Chart has been reviewed  Assessment/Plan  78 yo F with history of hyperthyroidism being under treated here with dyspnea and palpitations.    Present on Admission:  .Chest pain - very atypical she actually means breast pain and this should be further investigated as an outpatient with diagnostic mammogram. .Dyspnea - could be anginal equivalent, will watch on telemetry and cycle cardiac enzymes.  .CORONARY ARTERY DISEASE - will cycle CE, continue home medications .Takotsubo syndrome - check TSH and Free t4 level.  Palpitations - watch on telemetry, will check free t4 level  Prophylaxis:  Lovenox, Protonix  CODE STATUS: DNR/DNI  Other plan as per orders.  I have spent a total of 55 min on this admission  Reagann Dolce 11/17/2011, 3:52 AM

## 2011-11-17 NOTE — ED Provider Notes (Signed)
History     CSN: [id number]  Arrival date & time 11/16/11  2323   First MD Initiated Contact with Patient 11/16/11 2328      Chief Complaint  Patient presents with  . Palpitations  . Shortness of Breath    (Consider location/radiation/quality/duration/timing/severity/associated sxs/prior treatment) HPI Hx per PT. Has h/o CAD and remote DVT, tonight with SOB, worse with exertion,took NTG without relief, lives with daughter who is bedside. Also some L shoulder and chest pain. No trauma, no rash, no N/V. Pain and SOB MOD in severity. Symptoms still present at rest.  Past Medical History  Diagnosis Date  . ANEMIA, PERNICIOUS 09/10/2006  . CORONARY ARTERY DISEASE 11/07/2008  . CVA WITH LEFT HEMIPARESIS 07/22/2007    May, 2009, neurology decided aspirin and Plavix at that time.  . DERMATITIS 03/02/2007  . HYPERTENSION 07/15/2006  . MENINGIOMA 08/27/2006    Occipital meningioma, surgery, [facility], 2008 /  MRI, [place], June, 2011, no change in lesion, history believe the patient had a gamma knife treatment of a right occipital atypical meningioma  . OSTEOARTHRITIS 07/15/2006  . PULMONARY NODULE 01/14/2007    [facility], December, 2011  . Takotsubo syndrome 04/22/2008    MI, May, 2008, question takotsubo event  . THYROID NODULE 01/14/2007  . IBS (irritable bowel syndrome)   . DJD (degenerative joint disease)   . Ejection fraction     EF 60%, echo, July, 2010, (  EF improved after tach a suitable event 2008)  . Subarachnoid hemorrhage following injury 2007    Subarachnoid hemorrhage secondary to a fall December, 2007  . Bradycardia     August, 2013  . Stroke   . Family history of anesthesia complication     daughter has a hard time waking up  . Myocardial infarction   . Shortness of breath   . H/O hiatal hernia   . UTI (lower urinary tract infection) 10/14/2011    Past Surgical History  Procedure Date  . Cholecystectomy   . Abdominal hysterectomy   . Hip surgery   . Brain tumor  excision     Meningioma; treated at [facility] in 2008  . Shoulder surgery   . Joint replacement     rt THR    Family History  Problem Relation Age of Onset  . Heart attack Brother 40    History  Substance Use Topics  . Smoking status: Never Smoker   . Smokeless tobacco: Never Used  . Alcohol Use: No    OB History    Grav Para Term Preterm Abortions TAB SAB Ect Mult Living                  Review of Systems  Constitutional: Negative for fever and chills.  HENT: Negative for neck pain and neck stiffness.   Eyes: Negative for pain.  Respiratory: Positive for shortness of breath. Negative for cough.   Cardiovascular: Positive for chest pain. Negative for palpitations.  Gastrointestinal: Negative for abdominal pain.  Genitourinary: Negative for dysuria.  Musculoskeletal: Negative for back pain.  Skin: Negative for rash.  Neurological: Negative for headaches.  All other systems reviewed and are negative.    Allergies  Atorvastatin; Phenytoin; Promethazine hcl; Sulfacetamide sodium; and Sulfamethoxazole  Home Medications   Current Outpatient Rx  Name Route Sig Dispense Refill  . BETHANECHOL CHLORIDE 10 MG PO TABS Oral Take 10-20 mg by mouth 4 (four) times daily as needed. For urinary retention.    . CLOPIDOGREL BISULFATE 75 MG PO TABS  Oral Take 75 mg by mouth daily.    Marland Kitchen ESTROGENS, CONJUGATED 0.625 MG/GM VA CREA Vaginal Place 1 g vaginally daily as needed. For hot flashes.    . CYANOCOBALAMIN 1000 MCG/ML IJ SOLN Intramuscular Inject 1,000 mcg into the muscle every 30 (thirty) days.    Marland Kitchen METHIMAZOLE 10 MG PO TABS Oral Take 1 tablet (10 mg total) by mouth 2 (two) times daily. 60 tablet 1  . NITROGLYCERIN 0.4 MG SL SUBL Sublingual Place 0.4 mg under the tongue every 5 (five) minutes as needed. For chest pain.    Marland Kitchen PRAVASTATIN SODIUM 20 MG PO TABS Oral Take 20 mg by mouth every evening.    Marland Kitchen TRAMADOL HCL 50 MG PO TABS Oral Take 1 tablet (50 mg total) by mouth every 6 (six)  hours as needed. For pain. 60 tablet 1  . TRIAMCINOLONE ACETONIDE 0.1 % EX OINT Topical Apply 1 application topically 2 (two) times daily as needed. For dry skin.      BP 126/58  Pulse 65  Temp 98.3 F (36.8 C) (Oral)  Resp 20  SpO2 98%  Physical Exam  Constitutional: She is oriented to person, place, and time. She appears well-developed and well-nourished.  HENT:  Head: Normocephalic and atraumatic.  Eyes: Conjunctivae normal and EOM are normal. Pupils are equal, round, and reactive to light.  Neck: Trachea normal. Neck supple. No thyromegaly present.  Cardiovascular: Normal rate, regular rhythm, S1 normal, S2 normal and normal pulses.     No systolic murmur is present   No diastolic murmur is present  Pulses:      Radial pulses are 2+ on the right side, and 2+ on the left side.  Pulmonary/Chest: Effort normal and breath sounds normal. She has no wheezes. She has no rhonchi. She has no rales. She exhibits no tenderness.  Abdominal: Soft. Normal appearance and bowel sounds are normal. There is no tenderness. There is no CVA tenderness and negative Murphy's sign.  Musculoskeletal:       BLE:s Calves nontender, no cords or erythema, negative Homans sign  Neurological: She is alert and oriented to person, place, and time. She has normal strength. No cranial nerve deficit or sensory deficit. GCS eye subscore is 4. GCS verbal subscore is 5. GCS motor subscore is 6.  Skin: Skin is warm and dry. No rash noted. She is not diaphoretic.  Psychiatric: Her speech is normal.       Cooperative and appropriate    ED Course  Procedures (including critical care time)   Results for orders placed during the hospital encounter of 11/16/11  CBC      Component Value Range   WBC 7.8  4.0 - 10.5 K/uL   RBC 3.74 (*) 3.87 - 5.11 MIL/uL   Hemoglobin 11.3 (*) 12.0 - 15.0 g/dL   HCT 14.7 (*) 82.9 - 56.2 %   MCV 89.0  78.0 - 100.0 fL   MCH 30.2  26.0 - 34.0 pg   MCHC 33.9  30.0 - 36.0 g/dL   RDW  13.0  86.5 - 78.4 %   Platelets 162  150 - 400 K/uL  D-DIMER, QUANTITATIVE      Component Value Range   D-Dimer, Quant 1.96 (*) 0.00 - 0.48 ug/mL-FEU  PRO B NATRIURETIC PEPTIDE      Component Value Range   Pro B Natriuretic peptide (BNP) 545.1 (*) 0 - 450 pg/mL   Dg Chest Portable 1 View  11/16/2011  *RADIOLOGY REPORT*  Clinical Data: Shortness  of breath, palpitations  PORTABLE CHEST - 1 VIEW  Comparison: 10/14/2011  Findings: Hyperinflation.  No focal consolidation. No pleural effusion or pneumothorax.  Mild cardiomegaly.  Aortic tortuosity.  IMPRESSION: No evidence of acute cardiopulmonary disease.   Original Report Authenticated By: Charline Bills, M.D.      Date: 11/17/2011  Rate: 66  Rhythm: normal sinus rhythm  QRS Axis: normal  Intervals: normal  ST/T Wave abnormalities: nonspecific ST changes  Conduction Disutrbances:none  Narrative Interpretation:   Old EKG Reviewed: unchanged  Troponin 0.01, crt 1.0 and CHEM 8 o/w WNL.   remians dyspneic despite oxygen, given cardiac history, MED consult. Ambulates with symptoms but no hypoxia.   3:40 AM d/w Dr Kara Pacer - will eval in the ED for admit MDM   Elderly female with Cp and SOB and h/o CAD. ECG. Labs, imaging,. Admit.         Sunnie Nielsen, MD 11/18/11 [phone]

## 2011-11-17 NOTE — ED Notes (Signed)
Ambulated 100 feet on RA SPO2 99-100% tolerated well but did complain of palpitations and was noted to be mildly SOB after ambulation.

## 2011-11-18 DIAGNOSIS — E0590 Thyrotoxicosis, unspecified without thyrotoxic crisis or storm: Secondary | ICD-10-CM

## 2011-11-18 DIAGNOSIS — R1310 Dysphagia, unspecified: Secondary | ICD-10-CM | POA: Diagnosis present

## 2011-11-18 LAB — MAGNESIUM: Magnesium: 1.8 mg/dL (ref 1.5–2.5)

## 2011-11-18 LAB — PHOSPHORUS: Phosphorus: 3.4 mg/dL (ref 2.3–4.6)

## 2011-11-18 MED ORDER — ENSURE COMPLETE PO LIQD
237.0000 mL | Freq: Two times a day (BID) | ORAL | Status: DC
  Administered 2011-11-18 – 2011-11-19 (×2): 237 mL via ORAL

## 2011-11-18 NOTE — Progress Notes (Signed)
[facility] PROGRESS NOTE  [name] WUJ:[id number] DOB: 12/29/1922 DOA: 11/16/2011 PCP: Rogelia Boga, MD  Assessment/Plan: Chest pain  musculoskeletal in nature. Serial CE and EKG unremarkable Labs stable except for low TSH normal free T4 level. Patient recently followed with her endocrinologist as outpatient about 5 days back. Adjustment of thyroid medication as per primary. denies any chest pain at this time and is stable on telemetry. Serial cardiac enzymes are negative as well. Patient noted to be mildly orthostatic this morning. Continue with IV fluids  -Reviewing previous notes she was recently seen by Dr. Myrtis Ser from about cardiology and given her previous cardiac workup it appears that her chest pain is musculoskeletal in nature.  -Patient informs of feeling weak and family at bedside informs that she has poor appetite.  -Ordered for a nutrition consult.  - 2D. echo has been done and essentially normal   Weakness with poor po intake informs feeling full on minimal po intake. No N/V  Seen by swallow eval recommends small regular meals and GI eval. Will d/w GI in am . Possibly can get outpt follow up  Code Status:  DNR Family Communication: none at bedside Disposition Plan: home    Consultants:  none  Procedures:  none  Antibiotics:  none  HPI/Subjective: Feels tired and still has poor appetite. BP stable.   Objective: Filed Vitals:   11/18/11 0201 11/18/11 0459 11/18/11 1024 11/18/11 1500  BP: 106/60 117/52 112/50 117/48  Pulse: 63 64 61 64  Temp: 97.9 F (36.6 C) 98.1 F (36.7 C) 98.2 F (36.8 C) 97.9 F (36.6 C)  TempSrc: Oral Oral  Oral  Resp: 18 18  18   Height:      Weight:  54.159 kg (119 lb 6.4 oz)    SpO2: 96% 97%  98%    Intake/Output Summary (Last 24 hours) at 11/18/11 1834 Last data filed at 11/18/11 0842  Gross per 24 hour  Intake    360 ml  Output    825 ml  Net   -465 ml   Filed Weights   11/17/11 0639 11/18/11  0459  Weight: 52.844 kg (116 lb 8 oz) 54.159 kg (119 lb 6.4 oz)    Exam:   General:  elderly female in NAD, appears tired  Heent: No pallor, moist mucosa  Cardiovascular: N s1&s2, No murmurs  Respiratory: clear b/l, no added sounds  Abdomen: soft, NT, N,D BS+  Ext: warm, no edema  'CNS: AAOX3  Data Reviewed: Basic Metabolic Panel:  Lab 11/18/11 9562 11/17/11 0740 11/17/11 0136 11/16/11 2353  NA -- 137 132* 135  K -- 4.0 4.2 4.3  CL -- 102 97 101  CO2 -- 26 26 --  GLUCOSE -- 113* 104* 109*  BUN -- 14 17 17   CREATININE -- 0.84 0.90 1.00  CALCIUM -- 9.4 9.4 --  MG 1.8 -- -- --  PHOS 3.4 -- -- --   Liver Function Tests:  Lab 11/17/11 0740 11/17/11 0136  AST 17 19  ALT 12 13  ALKPHOS 89 92  BILITOT 0.3 0.2*  PROT 6.2 6.4  ALBUMIN 3.5 3.5   No results found for this basename: LIPASE:5,AMYLASE:5 in the last 168 hours No results found for this basename: AMMONIA:5 in the last 168 hours CBC:  Lab 11/17/11 0740 11/16/11 2353 11/16/11 2336  WBC 5.8 -- 7.8  NEUTROABS -- -- --  HGB 11.2* 11.6* 11.3*  HCT 33.8* 34.0* 33.3*  MCV 88.0 -- 89.0  PLT 159 -- 162  Cardiac Enzymes:  Lab 11/17/11 1834 11/17/11 1129 11/17/11 0740  CKTOTAL -- -- --  CKMB -- -- --  CKMBINDEX -- -- --  TROPONINI <0.30 <0.30 <0.30   BNP (last 3 results)  Basename 11/17/11 0009  PROBNP 545.1*   CBG: No results found for this basename: GLUCAP:5 in the last 168 hours  No results found for this or any previous visit (from the past 240 hour(s)).   Studies: Ct Angio Chest Pe W/cm &/or Wo Cm  11/17/2011  *RADIOLOGY REPORT*  Clinical Data: Shortness of breath, palpitations  CT ANGIOGRAPHY CHEST  Technique:  Multidetector CT imaging of the chest using the standard protocol during bolus administration of intravenous contrast. Multiplanar reconstructed images including MIPs were obtained and reviewed to evaluate the vascular anatomy.  Contrast: OMNIPAQUE IOHEXOL 350 MG/ML SOLN   Comparison: Chest radiograph dated 11/16/2011.  CT chest dated 08/15/2010.  Findings: No evidence of pulmonary embolism.  Stable bilobed patchy/nodular opacity in the right lung apex (series 5/image 22).  Additional stable ground-glass opacity in the posterior right upper lobe (series 5/image 29).  While this appearance is similar to prior studies, low grade adenocarcinoma remains possible.  Stable 4 mm nodule in the left upper lobe (series 5/image 37) and 2 mm nodule in the lingula (series 5/image 55), unchanged from 2011, benign.  Mild dependent atelectasis / subpleural reticulation at the lung bases.  Biapical pleural parenchymal scarring.  Visualized thyroid is mildly enlarged/heterogeneous.  The heart is normal in size.  No pericardial effusion.  Coronary atherosclerosis.  Atherosclerotic calcifications of the aortic arch.  No suspicious mediastinal, hilar, or axillary lymphadenopathy.  Visualized upper abdomen is unremarkable.  Degenerative changes of the visualized thoracolumbar spine. Vertebral hemangiomas at T6 and T12.  IMPRESSION: No evidence of pulmonary embolism.  Stable patchy/nodular and ground-glass opacities in the right upper lobe, as described above.  While grossly unchanged, low grade adenocarcinoma remains possible.   Original Report Authenticated By: Charline Bills, M.D.    Dg Chest Portable 1 View  11/16/2011  *RADIOLOGY REPORT*  Clinical Data: Shortness of breath, palpitations  PORTABLE CHEST - 1 VIEW  Comparison: 10/14/2011  Findings: Hyperinflation.  No focal consolidation. No pleural effusion or pneumothorax.  Mild cardiomegaly.  Aortic tortuosity.  IMPRESSION: No evidence of acute cardiopulmonary disease.   Original Report Authenticated By: Charline Bills, M.D.     Scheduled Meds:   . clopidogrel  75 mg Oral Q breakfast  . docusate sodium  100 mg Oral BID  . enoxaparin (LOVENOX) injection  30 mg Subcutaneous Q24H  . feeding supplement  237 mL Oral BID BM  . methimazole   10 mg Oral BID  . pravastatin  20 mg Oral q1800  . sodium chloride  3 mL Intravenous Q12H   Continuous Infusions:       Time spent: 25 minutes    [name]  [facility] Pager [phone]. If 8PM-8AM, please contact night-coverage at [email or web address], password [facility] 11/18/2011, 6:34 PM  LOS: 2 days

## 2011-11-18 NOTE — Progress Notes (Signed)
Spoke with daughter, daughter, who is POA, explained her concern in regards to the patient not being able to absorb food properly in the stomach and also explained that patient can eat its just that she has some nausea at times and it like the food stops right at the stomach. Notified and explained to attending MD of daughter's concern and MD stated that he will put in an order for swallowing consult and advised nurse to call to see if a NBS could be done either today or tomorrow. Will continue to monitor patient to end of shift.

## 2011-11-18 NOTE — Evaluation (Signed)
Clinical/Bedside Swallow Evaluation Patient Details  Name: [name] MRN: [id number] Date of Birth: 1922/10/19  Today's Date: 11/18/2011 Time: 3086-5784 SLP Time Calculation (min): 15 min  Past Medical History:  Past Medical History  Diagnosis Date  . ANEMIA, PERNICIOUS 09/10/2006  . CORONARY ARTERY DISEASE 11/07/2008  . CVA WITH LEFT HEMIPARESIS 07/22/2007    May, 2009, neurology decided aspirin and Plavix at that time.  . DERMATITIS 03/02/2007  . HYPERTENSION 07/15/2006  . MENINGIOMA 08/27/2006    Occipital meningioma, surgery, [facility], 2008 /  MRI, [place], June, 2011, no change in lesion, history believe the patient had a gamma knife treatment of a right occipital atypical meningioma  . OSTEOARTHRITIS 07/15/2006  . PULMONARY NODULE 01/14/2007    [place], December, 2011  . Takotsubo syndrome 04/22/2008    MI, May, 2008, question takotsubo event  . THYROID NODULE 01/14/2007  . IBS (irritable bowel syndrome)   . DJD (degenerative joint disease)   . Ejection fraction     EF 60%, echo, July, 2010, (  EF improved after tach a suitable event 2008)  . Subarachnoid hemorrhage following injury 2007    Subarachnoid hemorrhage secondary to a fall December, 2007  . Bradycardia     August, 2013  . Stroke   . Family history of anesthesia complication     daughter has a hard time waking up  . Myocardial infarction   . Shortness of breath   . H/O hiatal hernia   . UTI (lower urinary tract infection) 10/14/2011   Past Surgical History:  Past Surgical History  Procedure Date  . Cholecystectomy   . Abdominal hysterectomy   . Hip surgery   . Brain tumor excision     Meningioma; treated at [facility] in 2008  . Shoulder surgery   . Joint replacement     rt THR   HPI:  Patient presented to ED with report of not feeling well, feeling dizzy and heard to breath. She had shortness of breath. CTA of the chest was negative for PE. She is normaly not on oxygen at home but was noted to be hypoxic.  She have had hyperthyroidism recently and have been only taking half of her medication because of trouble tolerating it. She had some nausea but not vomiting and some diarrhea today.     Assessment / Plan / Recommendation Clinical Impression  Patient presents with suspected esophageal issues which appear to impact p.o. intake.  Bedside swallow evaluation was [facility] with adequate air way protections; however, characterized by multiple swallows, report of burning sensation, globus sensation and ability to only consume small portions.  It is recommended that patient continue with current diet and no skilled SLP follow up is warranted at this time.  MD please consider a GI consult.       Aspiration Risk  Mild    Diet Recommendation Regular;Thin liquid   Liquid Administration via: Cup;Straw Medication Administration: Other (Comment) (consider trilas with puree as needed) Supervision: Patient able to self feed Compensations: Slow rate;Follow solids with liquid Postural Changes and/or Swallow Maneuvers: Out of bed for meals;Seated upright 90 degrees;Upright 30-60 min after meal    Other  Recommendations Recommended Consults: Consider esophageal assessment;Consider GI evaluation Oral Care Recommendations: Oral care BID   Follow Up Recommendations  None       Pertinent Vitals/Pain none     Swallow Study Prior Functional Status   Regular and thin per patient report    General Date of Onset: 11/17/11 HPI: Patient presented to  ED with report of not feeling well, feeling dizzy and heard to breath. She had shortness of breath. CTA of the chest was negative for PE. She is normaly not on oxygen at home but was noted to be hypoxic. She have had hyperthyroidism recently and have been only taking half of her medication because of trouble tolerating it. She had some nausea but not vomiting and some diarrhea today.   Type of Study: Bedside swallow evaluation Previous Swallow Assessment: none per chart  review Diet Prior to this Study: Regular;Thin liquids Temperature Spikes Noted: No Respiratory Status: Room air History of Recent Intubation: No Behavior/Cognition: Alert;Cooperative;Pleasant mood Oral Cavity - Dentition: Adequate natural dentition Self-Feeding Abilities: Able to feed self;Needs set up Patient Positioning: Upright in bed Baseline Vocal Quality: Clear Volitional Cough: Strong Volitional Swallow: Able to elicit    Oral/Motor/Sensory Function Overall Oral Motor/Sensory Function: Appears within functional limits for tasks assessed   Ice Chips Ice chips: Not tested   Thin Liquid Thin Liquid: Within functional limits Presentation: Cup Pharyngeal  Phase Impairments: Multiple swallows Other Comments: patient reports it takes 2-3 swallows to get water down    Nectar Thick Nectar Thick Liquid: Not tested   Honey Thick Honey Thick Liquid: Not tested   Puree Puree: Not tested   Solid    Solid: Within functional limits Presentation: Self Fed Other Comments: Patient reports with small amounts she is ok; She can feel when she gets full and knows when to stop.      Fae Pippin, M.A., CCC-SLP [phone]  [name] 11/18/2011,4:51 PM

## 2011-11-18 NOTE — Progress Notes (Signed)
Per attending MD, Notified Radioloygy/Swallowing therapist to put patient on the board to have a MBS either today or tomorrow. Was told that it would not be today but will put patient on the list first thing on the list for tomorrow.  Notified daughter who is POA as well for because of this will not be going home today. Daughter understood and was very Adult nurse. Will continue to monitor patient to end of shift.

## 2011-11-18 NOTE — Progress Notes (Signed)
Utilization Review Completed.  

## 2011-11-18 NOTE — Progress Notes (Signed)
INITIAL ADULT NUTRITION ASSESSMENT Date: 11/18/2011   Time: 2:31 PM  Reason for Assessment: MD Consult regarding poor PO intake  INTERVENTION:  Ensure Complete PO BID to maximize oral intake, each supplement provides 350 kcal and 13 grams of protein.  Recommend appetite stimulant to help improve appetite, can take up to two weeks to see results.  DOCUMENTATION CODES Per approved criteria  -Not Applicable   ASSESSMENT: Female 78 y.o.  Dx: Chest pain  Hx:  Past Medical History  Diagnosis Date  . ANEMIA, PERNICIOUS 09/10/2006  . CORONARY ARTERY DISEASE 11/07/2008  . CVA WITH LEFT HEMIPARESIS 07/22/2007    May, 2009, neurology decided aspirin and Plavix at that time.  . DERMATITIS 03/02/2007  . HYPERTENSION 07/15/2006  . MENINGIOMA 08/27/2006    Occipital meningioma, surgery, [facility], 2008 /  MRI, [place], June, 2011, no change in lesion, history believe the patient had a gamma knife treatment of a right occipital atypical meningioma  . OSTEOARTHRITIS 07/15/2006  . PULMONARY NODULE 01/14/2007    [place], December, 2011  . Takotsubo syndrome 04/22/2008    MI, May, 2008, question takotsubo event  . THYROID NODULE 01/14/2007  . IBS (irritable bowel syndrome)   . DJD (degenerative joint disease)   . Ejection fraction     EF 60%, echo, July, 2010, (  EF improved after tach a suitable event 2008)  . Subarachnoid hemorrhage following injury 2007    Subarachnoid hemorrhage secondary to a fall December, 2007  . Bradycardia     August, 2013  . Stroke   . Family history of anesthesia complication     daughter has a hard time waking up  . Myocardial infarction   . Shortness of breath   . H/O hiatal hernia   . UTI (lower urinary tract infection) 10/14/2011    Past Surgical History  Procedure Date  . Cholecystectomy   . Abdominal hysterectomy   . Hip surgery   . Brain tumor excision     Meningioma; treated at [facility] in 2008  . Shoulder surgery   . Joint replacement     rt THR     Related Meds:  Scheduled Meds:   . clopidogrel  75 mg Oral Q breakfast  . docusate sodium  100 mg Oral BID  . enoxaparin (LOVENOX) injection  30 mg Subcutaneous Q24H  . methimazole  10 mg Oral BID  . pravastatin  20 mg Oral q1800  . sodium chloride  3 mL Intravenous Q12H   Continuous Infusions:   . sodium chloride 50 mL/hr at 11/17/11 0645   PRN Meds:.acetaminophen, acetaminophen, albuterol, alum & mag hydroxide-simeth, bethanechol, guaiFENesin-dextromethorphan, HYDROcodone-acetaminophen, ondansetron (ZOFRAN) IV, ondansetron, traMADol   Ht: 5\' 5"  (165.1 cm)  Wt: 119 lb 6.4 oz (54.159 kg)  Ideal Wt: 56.8 kg % Ideal Wt: 95%  Wt Readings from Last 10 Encounters:  11/18/11 119 lb 6.4 oz (54.159 kg)  11/12/11 122 lb (55.339 kg)  11/05/11 121 lb (54.885 kg)  10/29/11 120 lb (54.432 kg)  10/22/11 123 lb (55.792 kg)  10/14/11 131 lb 3.2 oz (59.512 kg)  10/03/11 122 lb (55.339 kg)  09/28/11 121 lb (54.885 kg)  09/17/11 123 lb (55.792 kg)  09/12/11 126 lb 12.8 oz (57.516 kg)   Usual Wt: 123-125 lb per patient % Usual Wt: 96%  Body mass index is 19.87 kg/(m^2).  Food/Nutrition Related Hx: Poor PO intake; no appetite  Labs:  CMP     Component Value Date/Time   NA 137 11/17/2011 0740  K 4.0 11/17/2011 0740   CL 102 11/17/2011 0740   CO2 26 11/17/2011 0740   GLUCOSE 113* 11/17/2011 0740   BUN 14 11/17/2011 0740   CREATININE 0.84 11/17/2011 0740   CALCIUM 9.4 11/17/2011 0740   PROT 6.2 11/17/2011 0740   ALBUMIN 3.5 11/17/2011 0740   AST 17 11/17/2011 0740   ALT 12 11/17/2011 0740   ALKPHOS 89 11/17/2011 0740   BILITOT 0.3 11/17/2011 0740   GFRNONAA 61* 11/17/2011 0740   GFRAA 70* 11/17/2011 0740     Intake/Output Summary (Last 24 hours) at 11/18/11 1436 Last data filed at 11/18/11 1610  Gross per 24 hour  Intake    960 ml  Output    825 ml  Net    135 ml    Diet Order: Heart Healthy   IVF:    sodium chloride Last Rate: 50 mL/hr at 11/17/11 0645     Estimated Nutritional Needs:   Kcal: 1350-1550  Protein: 70-80 gm Fluid: 1.4-1.6 liters  Patient reports "absolutely no appetite", "I have to force myself to eat."  4% weight loss over the past month.  Patient drinks Ensure and Boost at home.  Patient also complains of her food getting stuck after she swallows.  Per RN, daughter is worried that patient is not absorbing the food she eats if it is getting stuck.  Patient reports good intake of fluids at home.   Per RN, plans for swallow evaluation and MBS today.    NUTRITION DIAGNOSIS: -Inadequate oral intake (NI-2.1).  Status: Ongoing  RELATED TO: poor appetite  AS EVIDENCED BY: 4% weight loss in the past month  MONITORING/EVALUATION(Goals): Goal:  Intake to meet >90% of estimated nutrition needs. Monitor:  PO intake, labs, weight trend.  EDUCATION NEEDS: -Education needs addressed, discussed ways to increase calorie intake.  Joaquin Courts, RD, LDN, CNSC Pager# [phone] After Hours Pager# [phone]  11/18/2011, 2:31 PM

## 2011-11-19 ENCOUNTER — Observation Stay (HOSPITAL_COMMUNITY): Payer: Medicare Other

## 2011-11-19 DIAGNOSIS — R0789 Other chest pain: Secondary | ICD-10-CM

## 2011-11-19 DIAGNOSIS — K219 Gastro-esophageal reflux disease without esophagitis: Secondary | ICD-10-CM | POA: Diagnosis present

## 2011-11-19 MED ORDER — PANTOPRAZOLE SODIUM 40 MG PO TBEC: 40.0000 mg | DELAYED_RELEASE_TABLET | Freq: Every day | ORAL | Status: DC

## 2011-11-19 MED ORDER — PANTOPRAZOLE SODIUM 40 MG PO TBEC
40.0000 mg | DELAYED_RELEASE_TABLET | Freq: Every day | ORAL | Status: DC
  Administered 2011-11-19: 40 mg via ORAL
  Filled 2011-11-19: qty 1

## 2011-11-19 NOTE — Discharge Summary (Addendum)
Physician Discharge Summary  [name] ZOX:[id number] DOB: 10-27-1922 DOA: 11/16/2011  PCP: Rogelia Boga, MD  Admit date: 11/16/2011 Discharge date: 11/19/2011  Recommendations for Outpatient Follow-up:  Home with PCP follow up in 1 week  Discharge Diagnoses:  Principal Problem:  *GERD (gastroesophageal reflux disease) Active Problems:  CORONARY ARTERY DISEASE  Lightheadedness  Dysphagia  Chest pain, atypical hyperthyroidism   Discharge Condition: fair  Diet recommendation: low sodium  Filed Weights   11/17/11 0639 11/18/11 0459 11/19/11 0300  Weight: 52.844 kg (116 lb 8 oz) 54.159 kg (119 lb 6.4 oz) 56.1 kg (123 lb 10.9 oz)    History of present illness:  For details please review admission H&P but in brief,   Hospital Course:  Chest pain  musculoskeletal in nature. Serial CE and EKG unremarkable  Labs stable except for low TSH normal free T4 level. Patient recently followed with her endocrinologist as outpatient about 5 days back. Adjustment of thyroid medication as per primary. denies any chest pain at this time and is stable on telemetry. Serial cardiac enzymes are negative as well. Patient noted to be mildly orthostatic and given IV fluids and now resolved.  -Reviewing previous notes she was recently seen by Dr. Myrtis Ser from about cardiology and given her previous cardiac workup it appears that her chest pain is musculoskeletal in nature.  -Patient informs of feeling weak and family at bedside informs that she has poor appetite.  - 2D. echo has been done and essentially normal  - a stable patchy nodular ground glass opacity noted over rt upper lobe of lung on CT angio and can be followed up as outpt.  Weakness with poor po intake   Seen by swallow eval recommends small regular meals and GI eval. patient c/o dysphagia and a barium swallow was done showing acid reflux which is likely cause of her dysphagia and poor appetite . i will prescribe a course of  protonix and will follow up with her PCP as outpt.  Code Status: DNR  Family Communication: son at bedisde Disposition Plan: home    Procedures:  Barium swallow   2d echo   CT angio chest   Consultations:  none  Discharge Exam: Filed Vitals:   11/18/11 2200 11/19/11 0300 11/19/11 0434 11/19/11 1423  BP: 119/58  123/56 112/70  Pulse: 75  72 65  Temp: 99.4 F (37.4 C)  98.2 F (36.8 C) 97.7 F (36.5 C)  TempSrc: Oral  Oral Oral  Resp: 20  19 20   Height:      Weight:  56.1 kg (123 lb 10.9 oz)    SpO2: 98%  95% 97%      Discharge Instructions  Discharge Orders    Future Appointments: Provider: Department: Dept Phone: Center:   12/11/2011 9:00 AM Romero Belling, MD [facility] [phone] [facility]   12/19/2011 10:00 AM Gordy Savers, MD [facility] [phone] [facility]   03/17/2012 10:00 AM Gordy Savers, MD [facility] [phone] [facility]       Medication List     As of 11/19/2011  4:21 PM    TAKE these medications         bethanechol 10 MG tablet   Commonly known as: URECHOLINE   Take 10-20 mg by mouth 4 (four) times daily as needed. For urinary retention.      clopidogrel 75 MG tablet   Commonly known as: PLAVIX   Take 75 mg by mouth daily.      conjugated estrogens vaginal cream   Commonly known  as: PREMARIN   Place 1 g vaginally daily as needed. For hot flashes.      cyanocobalamin 1000 MCG/ML injection   Commonly known as: (VITAMIN B-12)   Inject 1,000 mcg into the muscle every 30 (thirty) days.      methimazole 10 MG tablet   Commonly known as: TAPAZOLE   Take 1 tablet (10 mg total) by mouth 2 (two) times daily.      nitroGLYCERIN 0.4 MG SL tablet   Commonly known as: NITROSTAT   Place 0.4 mg under the tongue every 5 (five) minutes as needed. For chest pain.      pantoprazole 40 MG tablet   Commonly known as: PROTONIX   Take 1 tablet (40 mg total) by mouth daily.      pravastatin 20 MG tablet   Commonly  known as: PRAVACHOL   Take 20 mg by mouth every evening.      traMADol 50 MG tablet   Commonly known as: ULTRAM   Take 1 tablet (50 mg total) by mouth every 6 (six) hours as needed. For pain.      triamcinolone ointment 0.1 %   Commonly known as: KENALOG   Apply 1 application topically 2 (two) times daily as needed. For dry skin.           Follow-up Information    Follow up with Rogelia Boga, MD. In 1 week.   Contact information:   [address] [place]ena Flake [place] [address] [phone]           The results of significant diagnostics from this hospitalization (including imaging, microbiology, ancillary and laboratory) are listed below for reference.    Significant Diagnostic Studies: Ct Angio Chest Pe W/cm &/or Wo Cm  11/17/2011  *RADIOLOGY REPORT*  Clinical Data: Shortness of breath, palpitations  CT ANGIOGRAPHY CHEST  Technique:  Multidetector CT imaging of the chest using the standard protocol during bolus administration of intravenous contrast. Multiplanar reconstructed images including MIPs were obtained and reviewed to evaluate the vascular anatomy.  Contrast: OMNIPAQUE IOHEXOL 350 MG/ML SOLN  Comparison: Chest radiograph dated 11/16/2011.  CT chest dated 08/15/2010.  Findings: No evidence of pulmonary embolism.  Stable bilobed patchy/nodular opacity in the right lung apex (series 5/image 22).  Additional stable ground-glass opacity in the posterior right upper lobe (series 5/image 29).  While this appearance is similar to prior studies, low grade adenocarcinoma remains possible.  Stable 4 mm nodule in the left upper lobe (series 5/image 37) and 2 mm nodule in the lingula (series 5/image 55), unchanged from 2011, benign.  Mild dependent atelectasis / subpleural reticulation at the lung bases.  Biapical pleural parenchymal scarring.  Visualized thyroid is mildly enlarged/heterogeneous.  The heart is normal in size.  No pericardial effusion.  Coronary  atherosclerosis.  Atherosclerotic calcifications of the aortic arch.  No suspicious mediastinal, hilar, or axillary lymphadenopathy.  Visualized upper abdomen is unremarkable.  Degenerative changes of the visualized thoracolumbar spine. Vertebral hemangiomas at T6 and T12.  IMPRESSION: No evidence of pulmonary embolism.  Stable patchy/nodular and ground-glass opacities in the right upper lobe, as described above.  While grossly unchanged, low grade adenocarcinoma remains possible.   Original Report Authenticated By: Charline Bills, M.D.    Dg Esophagus  11/19/2011  *RADIOLOGY REPORT*  Clinical Data: Dysphagia.  ESOPHOGRAM / BARIUM SWALLOW / BARIUM TABLET STUDY  Technique:  Combined double contrast and single contrast examination performed using effervescent crystals, thick barium liquid, and thin barium liquid.  The patient  was observed with fluoroscopy swallowing a 13mm barium sulphate tablet.  Fluoroscopy time:  1.48 minutes.  Comparison:  CT chest 11/17/2011.  Findings:  There is a poor primary peristaltic wave with multiple tertiary contractions consistent with presbyesophagus.  Slight extrinsic mass effect on the esophagus in the midportion relates to ectasia of the thoracic aorta.  There is no mucosal lesion.  No esophagitis is present.  There is no gastroesophageal stricture.  There is prompt passage of the barium tablet in the stomach.  There is no hiatal hernia.  Intermittent fluoroscopic examination with the patient supine demonstrated numerous episodes of gastroesophageal reflux, some to the level of the carina.  No aspiration was observed.  IMPRESSION: No evidence for esophagitis, hiatal hernia, or stricture.  Presbyesophagus with poor primary peristaltic wave.  Multiple episodes of gastroesophageal reflux without aspiration.   Original Report Authenticated By: Elsie Stain, M.D.    Dg Chest Portable 1 View  11/16/2011  *RADIOLOGY REPORT*  Clinical Data: Shortness of breath, palpitations   PORTABLE CHEST - 1 VIEW  Comparison: 10/14/2011  Findings: Hyperinflation.  No focal consolidation. No pleural effusion or pneumothorax.  Mild cardiomegaly.  Aortic tortuosity.  IMPRESSION: No evidence of acute cardiopulmonary disease.   Original Report Authenticated By: Charline Bills, M.D.     Microbiology: No results found for this or any previous visit (from the past 240 hour(s)).   Labs: Basic Metabolic Panel:  Lab 11/18/11 1610 11/17/11 0740 11/17/11 0136 11/16/11 2353  NA -- 137 132* 135  K -- 4.0 4.2 4.3  CL -- 102 97 101  CO2 -- 26 26 --  GLUCOSE -- 113* 104* 109*  BUN -- 14 17 17   CREATININE -- 0.84 0.90 1.00  CALCIUM -- 9.4 9.4 --  MG 1.8 -- -- --  PHOS 3.4 -- -- --   Liver Function Tests:  Lab 11/17/11 0740 11/17/11 0136  AST 17 19  ALT 12 13  ALKPHOS 89 92  BILITOT 0.3 0.2*  PROT 6.2 6.4  ALBUMIN 3.5 3.5   No results found for this basename: LIPASE:5,AMYLASE:5 in the last 168 hours No results found for this basename: AMMONIA:5 in the last 168 hours CBC:  Lab 11/17/11 0740 11/16/11 2353 11/16/11 2336  WBC 5.8 -- 7.8  NEUTROABS -- -- --  HGB 11.2* 11.6* 11.3*  HCT 33.8* 34.0* 33.3*  MCV 88.0 -- 89.0  PLT 159 -- 162   Cardiac Enzymes:  Lab 11/17/11 1834 11/17/11 1129 11/17/11 0740  CKTOTAL -- -- --  CKMB -- -- --  CKMBINDEX -- -- --  TROPONINI <0.30 <0.30 <0.30   BNP: BNP (last 3 results)  Basename 11/17/11 0009  PROBNP 545.1*   CBG: No results found for this basename: GLUCAP:5 in the last 168 hours  Time coordinating discharge: 25  minutes  Signed:  Eddie North  [facility] 11/19/2011, 4:21 PM

## 2011-11-19 NOTE — Progress Notes (Signed)
Late Entry-SLP (bedside swallow) evaluation addendum   12/14/2011 1650  SLP G-Codes **NOT FOR INPATIENT CLASS**  Functional Assessment Tool Used skilled clinical observation  Functional Limitations Swallowing  Swallow Current Status (W1191) CI  Swallow Goal Status (Y7829) CI  Swallow Discharge Status (F6213) CI  SLP Evaluations  $ SLP Speech Visit 1 Procedure  SLP Evaluations  $BSS Swallow 1 Procedure

## 2011-11-19 NOTE — Progress Notes (Signed)
[facility]  Patient Status: Active (receiving services up to time of hospitalization)  [facility] is providing the following services: PT  If patient discharges after hours, please call [phone].   Lanae Crumbly 11/19/2011, 9:24 AM

## 2011-11-22 ENCOUNTER — Ambulatory Visit (INDEPENDENT_AMBULATORY_CARE_PROVIDER_SITE_OTHER): Payer: Medicare Other | Admitting: Internal Medicine

## 2011-11-22 ENCOUNTER — Encounter: Payer: Self-pay | Admitting: Internal Medicine

## 2011-11-22 VITALS — BP 98/64 | HR 72 | Temp 98.0°F | Wt 121.0 lb

## 2011-11-22 DIAGNOSIS — D510 Vitamin B12 deficiency anemia due to intrinsic factor deficiency: Secondary | ICD-10-CM

## 2011-11-22 DIAGNOSIS — K219 Gastro-esophageal reflux disease without esophagitis: Secondary | ICD-10-CM

## 2011-11-22 DIAGNOSIS — E041 Nontoxic single thyroid nodule: Secondary | ICD-10-CM

## 2011-11-22 DIAGNOSIS — I10 Essential (primary) hypertension: Secondary | ICD-10-CM

## 2011-11-22 DIAGNOSIS — I2510 Atherosclerotic heart disease of native coronary artery without angina pectoris: Secondary | ICD-10-CM

## 2011-11-22 DIAGNOSIS — E0590 Thyrotoxicosis, unspecified without thyrotoxic crisis or storm: Secondary | ICD-10-CM

## 2011-11-22 NOTE — Progress Notes (Signed)
Subjective:    Patient ID: [name], female    DOB: 01-Jan-1923, 78 y.o.   MRN: [id number]  HPI  78 year old patient who is seen today post hospital discharge. Hospital records reviewed. She was admitted with chest pain that was thought to be GI in origin. She is doing fairly well today for chief complaint of weakness. She was discharged 3 days ago after a three-day hospital stay. She has been treated for hyperthyroidism. TSH is still suppressed but free T4 is now normal. She was discharged on Protonix. She is accompanied by her daughter who feels she is doing reasonably well  Past Medical History  Diagnosis Date  . ANEMIA, PERNICIOUS 09/10/2006  . CORONARY ARTERY DISEASE 11/07/2008  . CVA WITH LEFT HEMIPARESIS 07/22/2007    May, 2009, neurology decided aspirin and Plavix at that time.  . DERMATITIS 03/02/2007  . HYPERTENSION 07/15/2006  . MENINGIOMA 08/27/2006    Occipital meningioma, surgery, [facility], 2008 /  MRI, [facility], June, 2011, no change in lesion, history believe the patient had a gamma knife treatment of a right occipital atypical meningioma  . OSTEOARTHRITIS 07/15/2006  . PULMONARY NODULE 01/14/2007    [facility], December, 2011  . Takotsubo syndrome 04/22/2008    MI, May, 2008, question takotsubo event  . THYROID NODULE 01/14/2007  . IBS (irritable bowel syndrome)   . DJD (degenerative joint disease)   . Ejection fraction     EF 60%, echo, July, 2010, (  EF improved after tach a suitable event 2008)  . Subarachnoid hemorrhage following injury 2007    Subarachnoid hemorrhage secondary to a fall December, 2007  . Bradycardia     August, 2013  . Stroke   . Family history of anesthesia complication     daughter has a hard time waking up  . Myocardial infarction   . Shortness of breath   . H/O hiatal hernia   . UTI (lower urinary tract infection) 10/14/2011    History   Social History  . Marital Status: Widowed    Spouse Name: N/A    Number of Children: 4  . Years of  Education: N/A   Occupational History  .     Social History Main Topics  . Smoking status: Never Smoker   . Smokeless tobacco: Never Used  . Alcohol Use: No  . Drug Use: No  . Sexually Active: No   Other Topics Concern  . Not on file   Social History Narrative   Widowed. Lives in [place], [place] with daughter.     Past Surgical History  Procedure Date  . Cholecystectomy   . Abdominal hysterectomy   . Hip surgery   . Brain tumor excision     Meningioma; treated at [facility] in 2008  . Shoulder surgery   . Joint replacement     rt THR    Family History  Problem Relation Age of Onset  . Heart attack Brother 84    Allergies  Allergen Reactions  . Atorvastatin Other (See Comments)    Raises liver enzymes (takes pravastatin at home)  . Phenytoin Other (See Comments)    Blood clots  . Promethazine Hcl Other (See Comments)    Severe sedation  . Sulfacetamide Sodium     REACTION: sulfa  . Sulfamethoxazole     REACTION: unspecified    Current Outpatient Prescriptions on File Prior to Visit  Medication Sig Dispense Refill  . bethanechol (URECHOLINE) 10 MG tablet Take 10-20 mg by mouth 4 (four) times daily  as needed. For urinary retention.      . clopidogrel (PLAVIX) 75 MG tablet Take 75 mg by mouth daily.      Marland Kitchen conjugated estrogens (PREMARIN) vaginal cream Place 1 g vaginally daily as needed. For hot flashes.      . cyanocobalamin (,VITAMIN B-12,) 1000 MCG/ML injection Inject 1,000 mcg into the muscle every 30 (thirty) days.      . methimazole (TAPAZOLE) 10 MG tablet Take 1 tablet (10 mg total) by mouth 2 (two) times daily.  60 tablet  1  . nitroGLYCERIN (NITROSTAT) 0.4 MG SL tablet Place 0.4 mg under the tongue every 5 (five) minutes as needed. For chest pain.      . pantoprazole (PROTONIX) 40 MG tablet Take 1 tablet (40 mg total) by mouth daily.  30 tablet  0  . pravastatin (PRAVACHOL) 20 MG tablet Take 20 mg by mouth every evening.      . traMADol (ULTRAM) 50 MG  tablet Take 1 tablet (50 mg total) by mouth every 6 (six) hours as needed. For pain.  60 tablet  1  . triamcinolone ointment (KENALOG) 0.1 % Apply 1 application topically 2 (two) times daily as needed. For dry skin.        BP 98/64  Pulse 72  Temp 98 F (36.7 C) (Oral)  Wt 121 lb (54.885 kg)  SpO2 97%       Review of Systems  Constitutional: Positive for fatigue.  HENT: Negative for hearing loss, congestion, sore throat, rhinorrhea, dental problem, sinus pressure and tinnitus.   Eyes: Negative for pain, discharge and visual disturbance.  Respiratory: Negative for cough and shortness of breath.   Cardiovascular: Negative for chest pain, palpitations and leg swelling.  Gastrointestinal: Negative for nausea, vomiting, abdominal pain, diarrhea, constipation, blood in stool and abdominal distention.  Genitourinary: Negative for dysuria, urgency, frequency, hematuria, flank pain, vaginal bleeding, vaginal discharge, difficulty urinating, vaginal pain and pelvic pain.  Musculoskeletal: Negative for joint swelling, arthralgias and gait problem.  Skin: Positive for rash.  Neurological: Positive for weakness. Negative for dizziness, syncope, speech difficulty, numbness and headaches.  Hematological: Negative for adenopathy.  Psychiatric/Behavioral: Negative for behavioral problems, dysphoric mood and agitation. The patient is not nervous/anxious.        Objective:   Physical Exam  Constitutional: She is oriented to person, place, and time. She appears well-developed and well-nourished.  HENT:  Head: Normocephalic.  Right Ear: External ear normal.  Left Ear: External ear normal.  Mouth/Throat: Oropharynx is clear and moist.  Eyes: Conjunctivae normal and EOM are normal. Pupils are equal, round, and reactive to light.  Neck: Normal range of motion. Neck supple. No thyromegaly present.  Cardiovascular: Normal rate, regular rhythm, normal heart sounds and intact distal pulses.     Pulmonary/Chest: Effort normal and breath sounds normal.  Abdominal: Soft. Bowel sounds are normal. She exhibits no mass. There is no tenderness.  Musculoskeletal: Normal range of motion.  Lymphadenopathy:    She has no cervical adenopathy.  Neurological: She is alert and oriented to person, place, and time.  Skin: Skin is warm and dry. No rash noted.       Facial discoloration and a Malar  region  Psychiatric: She has a normal mood and affect. Her behavior is normal.          Assessment & Plan:   Hyperthyroidism GERD. We'll continue Protonix although with history of pernicious anemia unclear of benefit Hypertension. Blood pressure normal off medication CAD stable  Recheck 3 months Follow up in endocrinology next month

## 2011-11-22 NOTE — Patient Instructions (Signed)
Avoids foods high in acid such as tomatoes citrus juices, and spicy foods.  Avoid eating within two hours of lying down or before exercising.  Do not overheat.  Try smaller more frequent meals.  If symptoms persist, elevate the head of her bed 12 inches while sleeping.  Return in 3 months for follow-up    It is important that you exercise regularly, at least 20 minutes 3 to 4 times per week.  If you develop chest pain or shortness of breath seek  medical attention.

## 2011-11-26 LAB — HM MAMMOGRAPHY: HM Mammogram: NEGATIVE

## 2011-11-27 ENCOUNTER — Other Ambulatory Visit: Payer: Self-pay | Admitting: Internal Medicine

## 2011-11-29 ENCOUNTER — Telehealth: Payer: Self-pay | Admitting: Internal Medicine

## 2011-11-29 NOTE — Telephone Encounter (Signed)
Spoke with pt- discussed feeling weak - low grade temp - offered appt - declined. Instructed to drink fluids, tylenol q6hr If no better - informed of Saturday clinic

## 2011-11-29 NOTE — Telephone Encounter (Signed)
Caller: Rose/Child; Patient Name: [name]; PCP: Eleonore Chiquito [facility]); Best Callback Phone Number: [phone].  Call regarding Body aches, slight fever, headache, sore throat, onset 10-23.  Temp 99.4 O.   Daughter gave OTC night time flu med, improved sore throat and headache. All emergent symptoms ruled out per Flu-like symptoms protocol, see in 4 hrs due to temp elevation in an individual w/ weakened immune system/elderly person. Pt seen on 11-22-11.  Daughter wanting to know what she can give Pt, since she is on Plavix and Thyroid meds.  Pt uses [facility], [place], [phone].  PLEASE REVIEW W/ MD AND FOLLOW UP W/ DAUGHTER.

## 2011-12-05 ENCOUNTER — Encounter: Payer: Self-pay | Admitting: Internal Medicine

## 2011-12-05 ENCOUNTER — Ambulatory Visit (INDEPENDENT_AMBULATORY_CARE_PROVIDER_SITE_OTHER): Payer: Medicare Other | Admitting: Internal Medicine

## 2011-12-05 VITALS — BP 118/80 | Wt 120.0 lb

## 2011-12-05 DIAGNOSIS — I10 Essential (primary) hypertension: Secondary | ICD-10-CM

## 2011-12-05 DIAGNOSIS — D510 Vitamin B12 deficiency anemia due to intrinsic factor deficiency: Secondary | ICD-10-CM

## 2011-12-05 DIAGNOSIS — M1990 Unspecified osteoarthritis, unspecified site: Secondary | ICD-10-CM

## 2011-12-05 MED ORDER — TRAMADOL HCL 50 MG PO TABS
50.0000 mg | ORAL_TABLET | Freq: Four times a day (QID) | ORAL | Status: DC | PRN
Start: 2011-12-05 — End: 2012-03-17

## 2011-12-05 MED ORDER — CYANOCOBALAMIN 1000 MCG/ML IJ SOLN
1000.0000 ug | Freq: Once | INTRAMUSCULAR | Status: AC
  Administered 2011-12-05: 1000 ug via INTRAMUSCULAR

## 2011-12-05 NOTE — Progress Notes (Signed)
  Subjective:    Patient ID: [name], female    DOB: 1922-08-10, 78 y.o.   MRN: [id number]  HPI  78 year-old patient who has a history of osteoarthritis. She has treated hypertension B12 deficiency and presents today with a chief complaint of posterior neck pain. This has been present for several days she has had a febrile illness with mild sore throat and temperature elevation it has improved. He complains of fatigue. She has been treated  with anti-thyroid medications for hyperthyroidism.    Review of Systems  Constitutional: Positive for fatigue.  HENT: Negative for hearing loss, congestion, sore throat, rhinorrhea, dental problem, sinus pressure and tinnitus.   Eyes: Negative for pain, discharge and visual disturbance.  Respiratory: Negative for cough and shortness of breath.   Cardiovascular: Negative for chest pain, palpitations and leg swelling.  Gastrointestinal: Negative for nausea, vomiting, abdominal pain, diarrhea, constipation, blood in stool and abdominal distention.  Genitourinary: Negative for dysuria, urgency, frequency, hematuria, flank pain, vaginal bleeding, vaginal discharge, difficulty urinating, vaginal pain and pelvic pain.  Musculoskeletal: Negative for joint swelling, arthralgias (neck pain) and gait problem.  Skin: Negative for rash.  Neurological: Positive for weakness. Negative for dizziness, syncope, speech difficulty, numbness and headaches.  Hematological: Negative for adenopathy.  Psychiatric/Behavioral: Negative for behavioral problems, dysphoric mood and agitation. The patient is not nervous/anxious.        Objective:   Physical Exam  Constitutional: She is oriented to person, place, and time. She appears well-developed and well-nourished.  HENT:  Head: Normocephalic.  Right Ear: External ear normal.  Left Ear: External ear normal.  Mouth/Throat: Oropharynx is clear and moist.  Eyes: Conjunctivae normal and EOM are normal. Pupils are equal,  round, and reactive to light.  Neck: Normal range of motion. Neck supple. No thyromegaly present.  Cardiovascular: Normal rate, regular rhythm, normal heart sounds and intact distal pulses.   Pulmonary/Chest: Effort normal and breath sounds normal.  Abdominal: Soft. Bowel sounds are normal. She exhibits no mass. There is no tenderness.  Musculoskeletal: Normal range of motion.       Posterior neck musculature somewhat tight and tense  Lymphadenopathy:    She has no cervical adenopathy.  Neurological: She is alert and oriented to person, place, and time.  Skin: Skin is warm and dry. No rash noted.  Psychiatric: She has a normal mood and affect. Her behavior is normal.          Assessment & Plan:   Neck pain. Will treat with warm compresses and a soft cervical collar. We'll continue Tylenol and tramadol we'll try some gentle stretching and massage therapy Pernicious anemia. B12 shot injected  Recheck 3 months

## 2011-12-05 NOTE — Patient Instructions (Signed)
Soft cervical collar Continue Tylenol and tramadol for pain General stretching and massage therapy You  may move around, but avoid painful motions and activities.  Apply heat  to the sore area for 15 to 20 minutes 3 or 4 times daily for the next two to 3 days.

## 2011-12-06 ENCOUNTER — Ambulatory Visit: Payer: Medicare Other | Admitting: Internal Medicine

## 2011-12-11 ENCOUNTER — Ambulatory Visit (INDEPENDENT_AMBULATORY_CARE_PROVIDER_SITE_OTHER): Payer: Medicare Other | Admitting: Endocrinology

## 2011-12-11 ENCOUNTER — Encounter: Payer: Self-pay | Admitting: Endocrinology

## 2011-12-11 VITALS — BP 112/68 | HR 74 | Temp 97.4°F | Wt 118.0 lb

## 2011-12-11 DIAGNOSIS — E0590 Thyrotoxicosis, unspecified without thyrotoxic crisis or storm: Secondary | ICD-10-CM

## 2011-12-11 LAB — T4, FREE: Free T4: 1.59 ng/dL (ref 0.80–1.80)

## 2011-12-11 NOTE — Progress Notes (Signed)
Subjective:    Patient ID: [name], female    DOB: 02/03/1923, 78 y.o.   MRN: [id number]  HPI Pt was noted in the hospital in sept, 2013, to have hyperthyroidism.  She was started on tapazole.  She can tolerate only 10 mg of tapazole per day, due to GI sxs.  Since on the tapazole, she feels no different.  Her main symptom is generalized weakness.  Past Medical History  Diagnosis Date  . ANEMIA, PERNICIOUS 09/10/2006  . CORONARY ARTERY DISEASE 11/07/2008  . CVA WITH LEFT HEMIPARESIS 07/22/2007    May, 2009, neurology decided aspirin and Plavix at that time.  . DERMATITIS 03/02/2007  . HYPERTENSION 07/15/2006  . MENINGIOMA 08/27/2006    Occipital meningioma, surgery, [facility], 2008 /  MRI, [place], June, 2011, no change in lesion, history believe the patient had a gamma knife treatment of a right occipital atypical meningioma  . OSTEOARTHRITIS 07/15/2006  . PULMONARY NODULE 01/14/2007    [place], December, 2011  . Takotsubo syndrome 04/22/2008    MI, May, 2008, question takotsubo event  . THYROID NODULE 01/14/2007  . IBS (irritable bowel syndrome)   . DJD (degenerative joint disease)   . Ejection fraction     EF 60%, echo, July, 2010, (  EF improved after tach a suitable event 2008)  . Subarachnoid hemorrhage following injury 2007    Subarachnoid hemorrhage secondary to a fall December, 2007  . Bradycardia     August, 2013  . Stroke   . Family history of anesthesia complication     daughter has a hard time waking up  . Myocardial infarction   . Shortness of breath   . H/O hiatal hernia   . UTI (lower urinary tract infection) 10/14/2011    Past Surgical History  Procedure Date  . Cholecystectomy   . Abdominal hysterectomy   . Hip surgery   . Brain tumor excision     Meningioma; treated at [facility] in 2008  . Shoulder surgery   . Joint replacement     rt THR    History   Social History  . Marital Status: Widowed    Spouse Name: N/A    Number of Children: 4  . Years of  Education: N/A   Occupational History  .     Social History Main Topics  . Smoking status: Never Smoker   . Smokeless tobacco: Never Used  . Alcohol Use: No  . Drug Use: No  . Sexually Active: No   Other Topics Concern  . Not on file   Social History Narrative   Widowed. Lives in [place], [place] with daughter.     Current Outpatient Prescriptions on File Prior to Visit  Medication Sig Dispense Refill  . bethanechol (URECHOLINE) 10 MG tablet Take 10-20 mg by mouth 4 (four) times daily as needed. For urinary retention.      . clopidogrel (PLAVIX) 75 MG tablet TAKE 1 TABLET DAILY  90 tablet  3  . conjugated estrogens (PREMARIN) vaginal cream Place 1 g vaginally daily as needed. For hot flashes.      . cyanocobalamin (,VITAMIN B-12,) 1000 MCG/ML injection Inject 1,000 mcg into the muscle every 30 (thirty) days.      . methimazole (TAPAZOLE) 10 MG tablet Take 10 mg by mouth daily.      . nitroGLYCERIN (NITROSTAT) 0.4 MG SL tablet Place 0.4 mg under the tongue every 5 (five) minutes as needed. For chest pain.      . pantoprazole (PROTONIX)  40 MG tablet Take 1 tablet (40 mg total) by mouth daily.  30 tablet  0  . pravastatin (PRAVACHOL) 20 MG tablet TAKE 1 TABLET EVERY EVENING  90 tablet  3  . traMADol (ULTRAM) 50 MG tablet Take 1 tablet (50 mg total) by mouth every 6 (six) hours as needed. For pain.  60 tablet  1  . triamcinolone ointment (KENALOG) 0.1 % Apply 1 application topically 2 (two) times daily as needed. For dry skin.        Allergies  Allergen Reactions  . Atorvastatin Other (See Comments)    Raises liver enzymes (takes pravastatin at home)  . Phenytoin Other (See Comments)    Blood clots  . Promethazine Hcl Other (See Comments)    Severe sedation  . Sulfacetamide Sodium     REACTION: sulfa  . Sulfamethoxazole     REACTION: unspecified    Family History  Problem Relation Age of Onset  . Heart attack Brother 84    BP 112/68  Pulse 74  Temp 97.4 F (36.3 C)  (Oral)  Wt 118 lb (53.524 kg)  SpO2 97%  Review of Systems Denies fever    Objective:   Physical Exam VITAL SIGNS:  See vs page GENERAL: no distress Skin: not diaphoretic Neuro: moderate tremor of the hands  Lab Results  Component Value Date   TSH 0.272* 12/11/2011      Assessment & Plan:  Hyperthyroidism, improved

## 2011-12-11 NOTE — Patient Instructions (Addendum)
blood tests are being requested for you today.  We'll contact you with results. Please come back for a follow-up appointment for 1 month.   if ever you have fever while taking methimazole, stop it and call us, because of the risk of a rare side-effect

## 2011-12-19 ENCOUNTER — Ambulatory Visit: Payer: Medicare Other | Admitting: Internal Medicine

## 2012-01-10 ENCOUNTER — Ambulatory Visit (INDEPENDENT_AMBULATORY_CARE_PROVIDER_SITE_OTHER): Payer: Medicare Other | Admitting: Endocrinology

## 2012-01-10 ENCOUNTER — Encounter: Payer: Self-pay | Admitting: Endocrinology

## 2012-01-10 VITALS — BP 122/72 | HR 64 | Temp 97.8°F | Resp 16 | Wt 117.0 lb

## 2012-01-10 DIAGNOSIS — E0590 Thyrotoxicosis, unspecified without thyrotoxic crisis or storm: Secondary | ICD-10-CM

## 2012-01-10 LAB — T4, FREE: Free T4: 1.27 ng/dL (ref 0.80–1.80)

## 2012-01-10 NOTE — Progress Notes (Signed)
Subjective:    Patient ID: [name], female    DOB: 27-Sep-1922, 78 y.o.   MRN: [id number]  HPI Pt was noted in the hospital in sept, 2013, to have hyperthyroidism.  She was started on tapazole.  She can tolerate only 10 mg of tapazole per day, due to GI sxs.  Since on the tapazole, she feels no different.  Her main symptom is generalized weakness.   Past Medical History  Diagnosis Date  . ANEMIA, PERNICIOUS 09/10/2006  . CORONARY ARTERY DISEASE 11/07/2008  . CVA WITH LEFT HEMIPARESIS 07/22/2007    May, 2009, neurology decided aspirin and Plavix at that time.  . DERMATITIS 03/02/2007  . HYPERTENSION 07/15/2006  . MENINGIOMA 08/27/2006    Occipital meningioma, surgery, [facility], 2008 /  MRI, [place], June, 2011, no change in lesion, history believe the patient had a gamma knife treatment of a right occipital atypical meningioma  . OSTEOARTHRITIS 07/15/2006  . PULMONARY NODULE 01/14/2007    [place], December, 2011  . Takotsubo syndrome 04/22/2008    MI, May, 2008, question takotsubo event  . THYROID NODULE 01/14/2007  . IBS (irritable bowel syndrome)   . DJD (degenerative joint disease)   . Ejection fraction     EF 60%, echo, July, 2010, (  EF improved after tach a suitable event 2008)  . Subarachnoid hemorrhage following injury 2007    Subarachnoid hemorrhage secondary to a fall December, 2007  . Bradycardia     August, 2013  . Stroke   . Family history of anesthesia complication     daughter has a hard time waking up  . Myocardial infarction   . Shortness of breath   . H/O hiatal hernia   . UTI (lower urinary tract infection) 10/14/2011    Past Surgical History  Procedure Date  . Cholecystectomy   . Abdominal hysterectomy   . Hip surgery   . Brain tumor excision     Meningioma; treated at [facility] in 2008  . Shoulder surgery   . Joint replacement     rt THR    History   Social History  . Marital Status: Widowed    Spouse Name: N/A    Number of Children: 4  . Years of  Education: N/A   Occupational History  .     Social History Main Topics  . Smoking status: Never Smoker   . Smokeless tobacco: Never Used  . Alcohol Use: No  . Drug Use: No  . Sexually Active: No   Other Topics Concern  . Not on file   Social History Narrative   Widowed. Lives in [place], [place] with daughter.     Current Outpatient Prescriptions on File Prior to Visit  Medication Sig Dispense Refill  . clopidogrel (PLAVIX) 75 MG tablet TAKE 1 TABLET DAILY  90 tablet  3  . conjugated estrogens (PREMARIN) vaginal cream Place 1 g vaginally daily as needed. For hot flashes.      . cyanocobalamin (,VITAMIN B-12,) 1000 MCG/ML injection Inject 1,000 mcg into the muscle every 30 (thirty) days.      . methimazole (TAPAZOLE) 10 MG tablet Take 10 mg by mouth daily.      . nitroGLYCERIN (NITROSTAT) 0.4 MG SL tablet Place 0.4 mg under the tongue every 5 (five) minutes as needed. For chest pain.      . pantoprazole (PROTONIX) 40 MG tablet Take 1 tablet (40 mg total) by mouth daily.  30 tablet  0  . pravastatin (PRAVACHOL) 20 MG tablet  TAKE 1 TABLET EVERY EVENING  90 tablet  3  . traMADol (ULTRAM) 50 MG tablet Take 1 tablet (50 mg total) by mouth every 6 (six) hours as needed. For pain.  60 tablet  1  . triamcinolone ointment (KENALOG) 0.1 % Apply 1 application topically 2 (two) times daily as needed. For dry skin.      Marland Kitchen bethanechol (URECHOLINE) 10 MG tablet Take 10-20 mg by mouth 4 (four) times daily as needed. For urinary retention.        Allergies  Allergen Reactions  . Atorvastatin Other (See Comments)    Raises liver enzymes (takes pravastatin at home)  . Phenytoin Other (See Comments)    Blood clots  . Promethazine Hcl Other (See Comments)    Severe sedation  . Sulfacetamide Sodium     REACTION: sulfa  . Sulfamethoxazole     REACTION: unspecified    Family History  Problem Relation Age of Onset  . Heart attack Brother 84    BP 122/72  Pulse 64  Temp 97.8 F (36.6 C)   Resp 16  Wt 117 lb (53.071 kg)  Review of Systems Denies fever.    Objective:   Physical Exam VITAL SIGNS:  See vs page.   GENERAL: no distress. Gait: slow but steady with a cane.        Assessment & Plan:  Hyperthyroidism, on tapazole.

## 2012-01-10 NOTE — Patient Instructions (Addendum)
blood tests are being requested for you today.  We'll contact you with results. Please come back for a follow-up appointment in 1 month.   if ever you have fever while taking methimazole, stop it and call us, because of the risk of a rare side-effect. i'll refill the methimazole to [facility] when the results are available.

## 2012-01-11 ENCOUNTER — Other Ambulatory Visit: Payer: Self-pay | Admitting: Endocrinology

## 2012-01-11 MED ORDER — METHIMAZOLE 10 MG PO TABS: 10.0000 mg | ORAL_TABLET | Freq: Every day | ORAL | Status: DC

## 2012-01-15 ENCOUNTER — Ambulatory Visit (INDEPENDENT_AMBULATORY_CARE_PROVIDER_SITE_OTHER): Payer: Medicare Other | Admitting: Internal Medicine

## 2012-01-15 DIAGNOSIS — E569 Vitamin deficiency, unspecified: Secondary | ICD-10-CM

## 2012-01-15 MED ORDER — CYANOCOBALAMIN 1000 MCG/ML IJ SOLN
1000.0000 ug | Freq: Once | INTRAMUSCULAR | Status: AC
  Administered 2012-01-15: 1000 ug via INTRAMUSCULAR

## 2012-02-07 ENCOUNTER — Encounter: Payer: Self-pay | Admitting: Internal Medicine

## 2012-02-07 ENCOUNTER — Ambulatory Visit (INDEPENDENT_AMBULATORY_CARE_PROVIDER_SITE_OTHER): Payer: Medicare Other | Admitting: Internal Medicine

## 2012-02-07 VITALS — BP 130/70 | HR 88 | Temp 97.4°F | Resp 20 | Wt 116.0 lb

## 2012-02-07 DIAGNOSIS — E785 Hyperlipidemia, unspecified: Secondary | ICD-10-CM

## 2012-02-07 DIAGNOSIS — R0600 Dyspnea, unspecified: Secondary | ICD-10-CM

## 2012-02-07 DIAGNOSIS — R0789 Other chest pain: Secondary | ICD-10-CM

## 2012-02-07 DIAGNOSIS — E538 Deficiency of other specified B group vitamins: Secondary | ICD-10-CM

## 2012-02-07 DIAGNOSIS — I10 Essential (primary) hypertension: Secondary | ICD-10-CM

## 2012-02-07 DIAGNOSIS — R0989 Other specified symptoms and signs involving the circulatory and respiratory systems: Secondary | ICD-10-CM

## 2012-02-07 DIAGNOSIS — E0590 Thyrotoxicosis, unspecified without thyrotoxic crisis or storm: Secondary | ICD-10-CM

## 2012-02-07 DIAGNOSIS — I2510 Atherosclerotic heart disease of native coronary artery without angina pectoris: Secondary | ICD-10-CM

## 2012-02-07 DIAGNOSIS — R079 Chest pain, unspecified: Secondary | ICD-10-CM

## 2012-02-07 MED ORDER — CYANOCOBALAMIN 1000 MCG/ML IJ SOLN
1000.0000 ug | Freq: Once | INTRAMUSCULAR | Status: AC
  Administered 2012-02-07: 1000 ug via INTRAMUSCULAR

## 2012-02-07 MED ORDER — PANTOPRAZOLE SODIUM 40 MG PO TBEC: 40.0000 mg | DELAYED_RELEASE_TABLET | Freq: Every day | ORAL | Status: DC

## 2012-02-07 NOTE — Patient Instructions (Signed)
Limit your sodium (Salt) intake  Avoids foods high in acid such as tomatoes citrus juices, and spicy foods.  Avoid eating within two hours of lying down or before exercising.  Do not overheat.  Try smaller more frequent meals.  If symptoms persist, elevate the head of her bed 12 inches while sleeping.  Report to the hospital immediately if you develop any worsening symptoms

## 2012-02-07 NOTE — Progress Notes (Signed)
Subjective:    Patient ID: [name], female    DOB: 10-26-1922, 79 y.o.   MRN: [id number]  HPI  79 year old patient who has a history of coronary artery disease. She was hospitalized in October of last year due to  atypical chest pain;  it was thought to be musculoskeletal. She was evaluated by cardiology at that time. She has a history of GERD and also a history of pernicious anemia. Due to the likelihood of atrophic gastritis protonix  therapy was discontinued about one week ago. At approximately 2 PM today the patient noted the onset of substernal chest pain. She has been intermittently short of breath she stated initially that the chest pain lasted about 10 minutes but she also describes some occasional heaviness throughout the afternoon. At the present time her only complaint is shortness of breath. She states she had minimal improvement with nitroglycerin at home earlier. EKG was performed today that revealed no acute changes. O2 saturation 96%  Past Medical History  Diagnosis Date  . ANEMIA, PERNICIOUS 09/10/2006  . CORONARY ARTERY DISEASE 11/07/2008  . CVA WITH LEFT HEMIPARESIS 07/22/2007    May, 2009, neurology decided aspirin and Plavix at that time.  . DERMATITIS 03/02/2007  . HYPERTENSION 07/15/2006  . MENINGIOMA 08/27/2006    Occipital meningioma, surgery, [facility], 2008 /  MRI, [place], June, 2011, no change in lesion, history believe the patient had a gamma knife treatment of a right occipital atypical meningioma  . OSTEOARTHRITIS 07/15/2006  . PULMONARY NODULE 01/14/2007    [place], December, 2011  . Takotsubo syndrome 04/22/2008    MI, May, 2008, question takotsubo event  . THYROID NODULE 01/14/2007  . IBS (irritable bowel syndrome)   . DJD (degenerative joint disease)   . Ejection fraction     EF 60%, echo, July, 2010, (  EF improved after tach a suitable event 2008)  . Subarachnoid hemorrhage following injury 2007    Subarachnoid hemorrhage secondary to a fall December, 2007   . Bradycardia     August, 2013  . Stroke   . Family history of anesthesia complication     daughter has a hard time waking up  . Myocardial infarction   . Shortness of breath   . H/O hiatal hernia   . UTI (lower urinary tract infection) 10/14/2011    History   Social History  . Marital Status: Widowed    Spouse Name: N/A    Number of Children: 4  . Years of Education: N/A   Occupational History  .     Social History Main Topics  . Smoking status: Never Smoker   . Smokeless tobacco: Never Used  . Alcohol Use: No  . Drug Use: No  . Sexually Active: No   Other Topics Concern  . Not on file   Social History Narrative   Widowed. Lives in [place], [place] with daughter.     Past Surgical History  Procedure Date  . Cholecystectomy   . Abdominal hysterectomy   . Hip surgery   . Brain tumor excision     Meningioma; [facility] at [facility] in 2008  . Shoulder surgery   . Joint replacement     rt THR    Family History  Problem Relation Age of Onset  . Heart attack Brother 84    Allergies  Allergen Reactions  . Atorvastatin Other (See Comments)    Raises liver enzymes (takes pravastatin at home)  . Phenytoin Other (See Comments)    Blood clots  .  Promethazine Hcl Other (See Comments)    Severe sedation  . Sulfacetamide Sodium     REACTION: sulfa  . Sulfamethoxazole     REACTION: unspecified    Current Outpatient Prescriptions on File Prior to Visit  Medication Sig Dispense Refill  . bethanechol (URECHOLINE) 10 MG tablet Take 10-20 mg by mouth 4 (four) times daily as needed. For urinary retention.      . clopidogrel (PLAVIX) 75 MG tablet TAKE 1 TABLET DAILY  90 tablet  3  . conjugated estrogens (PREMARIN) vaginal cream Place 1 g vaginally daily as needed. For hot flashes.      . cyanocobalamin (,VITAMIN B-12,) 1000 MCG/ML injection Inject 1,000 mcg into the muscle every 30 (thirty) days.      . methimazole (TAPAZOLE) 10 MG tablet Take 1 tablet (10 mg total) by  mouth daily.  30 tablet  2  . nitroGLYCERIN (NITROSTAT) 0.4 MG SL tablet Place 0.4 mg under the tongue every 5 (five) minutes as needed. For chest pain.      . pravastatin (PRAVACHOL) 20 MG tablet TAKE 1 TABLET EVERY EVENING  90 tablet  3  . traMADol (ULTRAM) 50 MG tablet Take 1 tablet (50 mg total) by mouth every 6 (six) hours as needed. For pain.  60 tablet  1  . triamcinolone ointment (KENALOG) 0.1 % Apply 1 application topically 2 (two) times daily as needed. For dry skin.      . pantoprazole (PROTONIX) 40 MG tablet Take 1 tablet (40 mg total) by mouth daily.  30 tablet  0    BP 130/70  Pulse 88  Temp 97.4 F (36.3 C) (Oral)  Resp 20  Wt 116 lb (52.617 kg)  SpO2 96%        Review of Systems  Constitutional: Negative.   HENT: Negative for hearing loss, congestion, sore throat, rhinorrhea, dental problem, sinus pressure and tinnitus.   Eyes: Negative for pain, discharge and visual disturbance.  Respiratory: Positive for shortness of breath. Negative for cough.   Cardiovascular: Positive for chest pain. Negative for palpitations and leg swelling.  Gastrointestinal: Negative for nausea, vomiting, abdominal pain, diarrhea, constipation, blood in stool and abdominal distention.  Genitourinary: Negative for dysuria, urgency, frequency, hematuria, flank pain, vaginal bleeding, vaginal discharge, difficulty urinating, vaginal pain and pelvic pain.  Musculoskeletal: Negative for joint swelling, arthralgias and gait problem.  Skin: Negative for rash.  Neurological: Negative for dizziness, syncope, speech difficulty, weakness, numbness and headaches.  Hematological: Negative for adenopathy.  Psychiatric/Behavioral: Negative for behavioral problems, dysphoric mood and agitation. The patient is not nervous/anxious.        Objective:   Physical Exam  Constitutional: She is oriented to person, place, and time. She appears well-developed and well-nourished.  HENT:  Head: Normocephalic.    Right Ear: External ear normal.  Left Ear: External ear normal.  Mouth/Throat: Oropharynx is clear and moist.  Eyes: Conjunctivae normal and EOM are normal. Pupils are equal, round, and reactive to light.  Neck: Normal range of motion. Neck supple. No thyromegaly present.  Cardiovascular: Normal rate, regular rhythm, normal heart sounds and intact distal pulses.   Pulmonary/Chest: Effort normal and breath sounds normal.  Abdominal: Soft. Bowel sounds are normal. She exhibits no mass. There is no tenderness.  Musculoskeletal: Normal range of motion.  Lymphadenopathy:    She has no cervical adenopathy.  Neurological: She is alert and oriented to person, place, and time.  Skin: Skin is warm and dry. No rash noted.  Psychiatric: She  has a normal mood and affect. Her behavior is normal.          Assessment & Plan:  Chest pain and shortness breath and a little lady with a history of coronary artery disease. Chest pain earlier relieved by nitroglycerin. Presently complains of shortness of breath only. O2 saturation is normal. Her clinical exam and EKG is normal. Hospital admission to rule out ACS strongly encouraged. The patient declines. She is agreeable to report to the hospital she develops any worsening symptoms. It was recommended she resume Protonix.  Coronary artery disease History of GERD. PPI therapy resumed

## 2012-02-12 ENCOUNTER — Ambulatory Visit (INDEPENDENT_AMBULATORY_CARE_PROVIDER_SITE_OTHER): Payer: Medicare Other | Admitting: Endocrinology

## 2012-02-12 ENCOUNTER — Encounter: Payer: Self-pay | Admitting: Endocrinology

## 2012-02-12 ENCOUNTER — Ambulatory Visit: Payer: Medicare Other | Admitting: Internal Medicine

## 2012-02-12 VITALS — BP 128/74 | HR 59 | Temp 98.8°F | Wt 117.0 lb

## 2012-02-12 DIAGNOSIS — E0590 Thyrotoxicosis, unspecified without thyrotoxic crisis or storm: Secondary | ICD-10-CM

## 2012-02-12 LAB — T4, FREE: Free T4: 1.38 ng/dL (ref 0.80–1.80)

## 2012-02-12 NOTE — Patient Instructions (Addendum)
blood tests are being requested for you today.  We'll contact you with results. Please come back for a follow-up appointment in 2 months.  if ever you have fever while taking methimazole, stop it and call us, because of the risk of a rare side-effect  

## 2012-02-12 NOTE — Progress Notes (Signed)
Subjective:    Patient ID: [name], female    DOB: Apr 19, 1922, 79 y.o.   MRN: [id number]  HPI Pt was noted in the hospital in sept, 2013, to have hyperthyroidism.  She was started on tapazole.  She could tolerate only 10 mg of tapazole per day, due to GI sxs.  Since on the tapazole, she feels no different.  Her main symptom continues to be generalized weakness.  She has lost a few lbs.   Past Medical History  Diagnosis Date  . ANEMIA, PERNICIOUS 09/10/2006  . CORONARY ARTERY DISEASE 11/07/2008  . CVA WITH LEFT HEMIPARESIS 07/22/2007    May, 2009, neurology decided aspirin and Plavix at that time.  . DERMATITIS 03/02/2007  . HYPERTENSION 07/15/2006  . MENINGIOMA 08/27/2006    Occipital meningioma, surgery, [facility], 2008 /  MRI, [place], June, 2011, no change in lesion, history believe the patient had a gamma knife treatment of a right occipital atypical meningioma  . OSTEOARTHRITIS 07/15/2006  . PULMONARY NODULE 01/14/2007    [place], December, 2011  . Takotsubo syndrome 04/22/2008    MI, May, 2008, question takotsubo event  . THYROID NODULE 01/14/2007  . IBS (irritable bowel syndrome)   . DJD (degenerative joint disease)   . Ejection fraction     EF 60%, echo, July, 2010, (  EF improved after tach a suitable event 2008)  . Subarachnoid hemorrhage following injury 2007    Subarachnoid hemorrhage secondary to a fall December, 2007  . Bradycardia     August, 2013  . Stroke   . Family history of anesthesia complication     daughter has a hard time waking up  . Myocardial infarction   . Shortness of breath   . H/O hiatal hernia   . UTI (lower urinary tract infection) 10/14/2011    Past Surgical History  Procedure Date  . Cholecystectomy   . Abdominal hysterectomy   . Hip surgery   . Brain tumor excision     Meningioma; treated at [facility] in 2008  . Shoulder surgery   . Joint replacement     rt THR    History   Social History  . Marital Status: Widowed    Spouse Name: N/A     Number of Children: 4  . Years of Education: N/A   Occupational History  .     Social History Main Topics  . Smoking status: Never Smoker   . Smokeless tobacco: Never Used  . Alcohol Use: No  . Drug Use: No  . Sexually Active: No   Other Topics Concern  . Not on file   Social History Narrative   Widowed. Lives in [place], [place] with daughter.     Current Outpatient Prescriptions on File Prior to Visit  Medication Sig Dispense Refill  . bethanechol (URECHOLINE) 10 MG tablet Take 10-20 mg by mouth 4 (four) times daily as needed. For urinary retention.      . clopidogrel (PLAVIX) 75 MG tablet TAKE 1 TABLET DAILY  90 tablet  3  . conjugated estrogens (PREMARIN) vaginal cream Place 1 g vaginally daily as needed. For hot flashes.      . cyanocobalamin (,VITAMIN B-12,) 1000 MCG/ML injection Inject 1,000 mcg into the muscle every 30 (thirty) days.      . methimazole (TAPAZOLE) 10 MG tablet Take 1 tablet (10 mg total) by mouth daily.  30 tablet  2  . nitroGLYCERIN (NITROSTAT) 0.4 MG SL tablet Place 0.4 mg under the tongue every 5 (five)  minutes as needed. For chest pain.      . pantoprazole (PROTONIX) 40 MG tablet Take 1 tablet (40 mg total) by mouth daily.  30 tablet  0  . pravastatin (PRAVACHOL) 20 MG tablet TAKE 1 TABLET EVERY EVENING  90 tablet  3  . traMADol (ULTRAM) 50 MG tablet Take 1 tablet (50 mg total) by mouth every 6 (six) hours as needed. For pain.  60 tablet  1  . triamcinolone ointment (KENALOG) 0.1 % Apply 1 application topically 2 (two) times daily as needed. For dry skin.        Allergies  Allergen Reactions  . Atorvastatin Other (See Comments)    Raises liver enzymes (takes pravastatin at home)  . Phenytoin Other (See Comments)    Blood clots  . Promethazine Hcl Other (See Comments)    Severe sedation  . Sulfacetamide Sodium     REACTION: sulfa  . Sulfamethoxazole     REACTION: unspecified    Family History  Problem Relation Age of Onset  . Heart attack  Brother 84    BP 128/74  Pulse 59  Temp 98.8 F (37.1 C) (Oral)  Wt 117 lb (53.071 kg)  SpO2 98%    Review of Systems Denies fever    Objective:   Physical Exam VITAL SIGNS:  See vs page. GENERAL: no distress. NECK: There is no palpable thyroid enlargement, but there may be a left lobe nodule. No palpable lymphadenopathy at the anterior neck.     Lab Results  Component Value Date   TSH 1.168 02/12/2012      Assessment & Plan:  Hyperthyroidism, well-controlled

## 2012-02-21 ENCOUNTER — Ambulatory Visit: Payer: Medicare Other | Admitting: Internal Medicine

## 2012-03-10 ENCOUNTER — Ambulatory Visit: Payer: Medicare Other | Admitting: Internal Medicine

## 2012-03-17 ENCOUNTER — Telehealth: Payer: Self-pay | Admitting: Internal Medicine

## 2012-03-17 ENCOUNTER — Encounter: Payer: Self-pay | Admitting: Internal Medicine

## 2012-03-17 ENCOUNTER — Ambulatory Visit (INDEPENDENT_AMBULATORY_CARE_PROVIDER_SITE_OTHER): Payer: Medicare Other | Admitting: Internal Medicine

## 2012-03-17 ENCOUNTER — Other Ambulatory Visit: Payer: Self-pay | Admitting: Internal Medicine

## 2012-03-17 VITALS — BP 140/74 | HR 72 | Temp 97.6°F | Resp 20 | Wt 116.0 lb

## 2012-03-17 DIAGNOSIS — D510 Vitamin B12 deficiency anemia due to intrinsic factor deficiency: Secondary | ICD-10-CM

## 2012-03-17 DIAGNOSIS — M1990 Unspecified osteoarthritis, unspecified site: Secondary | ICD-10-CM

## 2012-03-17 DIAGNOSIS — I10 Essential (primary) hypertension: Secondary | ICD-10-CM

## 2012-03-17 DIAGNOSIS — E041 Nontoxic single thyroid nodule: Secondary | ICD-10-CM

## 2012-03-17 DIAGNOSIS — E538 Deficiency of other specified B group vitamins: Secondary | ICD-10-CM

## 2012-03-17 DIAGNOSIS — E785 Hyperlipidemia, unspecified: Secondary | ICD-10-CM

## 2012-03-17 MED ORDER — CYANOCOBALAMIN 1000 MCG/ML IJ SOLN
1000.0000 ug | Freq: Once | INTRAMUSCULAR | Status: AC
  Administered 2012-03-17: 1000 ug via INTRAMUSCULAR

## 2012-03-17 MED ORDER — TRAMADOL HCL 50 MG PO TABS: 50.0000 mg | ORAL_TABLET | Freq: Four times a day (QID) | ORAL | Status: DC | PRN

## 2012-03-17 NOTE — Telephone Encounter (Signed)
Pt would like to know if it is ok to come in on March 12 at 10am instead of 11:45 for an injection?

## 2012-03-17 NOTE — Progress Notes (Signed)
Subjective:    Patient ID: [name], female    DOB: October 18, 1922, 79 y.o.   MRN: [id number]  HPI  79 year old patient who is seen today for followup. She has hypertension coronary artery disease and osteoarthritis. She is doing reasonably well but does have a number of somatic complaints including weakness and left upper quadrant abdominal pain. She's been followed by endocrinology for a hyperfunctioning thyroid nodule. Medical regimen includes Tapazole. Thyroid studies were normal last month. Her weight has been stable.  Past Medical History  Diagnosis Date  . ANEMIA, PERNICIOUS 09/10/2006  . CORONARY ARTERY DISEASE 11/07/2008  . CVA WITH LEFT HEMIPARESIS 07/22/2007    May, 2009, neurology decided aspirin and Plavix at that time.  . DERMATITIS 03/02/2007  . HYPERTENSION 07/15/2006  . MENINGIOMA 08/27/2006    Occipital meningioma, surgery, [facility], 2008 /  MRI, [place], June, 2011, no change in lesion, history believe the patient had a gamma knife treatment of a right occipital atypical meningioma  . OSTEOARTHRITIS 07/15/2006  . PULMONARY NODULE 01/14/2007    [facility], December, 2011  . Takotsubo syndrome 04/22/2008    MI, May, 2008, question takotsubo event  . THYROID NODULE 01/14/2007  . IBS (irritable bowel syndrome)   . DJD (degenerative joint disease)   . Ejection fraction     EF 60%, echo, July, 2010, (  EF improved after tach a suitable event 2008)  . Subarachnoid hemorrhage following injury 2007    Subarachnoid hemorrhage secondary to a fall December, 2007  . Bradycardia     August, 2013  . Stroke   . Family history of anesthesia complication     daughter has a hard time waking up  . Myocardial infarction   . Shortness of breath   . H/O hiatal hernia   . UTI (lower urinary tract infection) 10/14/2011    History   Social History  . Marital Status: Widowed    Spouse Name: N/A    Number of Children: 4  . Years of Education: N/A   Occupational History  .     Social  History Main Topics  . Smoking status: Never Smoker   . Smokeless tobacco: Never Used  . Alcohol Use: No  . Drug Use: No  . Sexually Active: No   Other Topics Concern  . Not on file   Social History Narrative   Widowed. Lives in [place], [place] with daughter.     Past Surgical History  Procedure Laterality Date  . Cholecystectomy    . Abdominal hysterectomy    . Hip surgery    . Brain tumor excision      Meningioma; treated at [facility] in 2008  . Shoulder surgery    . Joint replacement      rt THR    Family History  Problem Relation Age of Onset  . Heart attack Brother 84    Allergies  Allergen Reactions  . Atorvastatin Other (See Comments)    Raises liver enzymes (takes pravastatin at home)  . Phenytoin Other (See Comments)    Blood clots  . Promethazine Hcl Other (See Comments)    Severe sedation  . Sulfacetamide Sodium     REACTION: sulfa  . Sulfamethoxazole     REACTION: unspecified    Current Outpatient Prescriptions on File Prior to Visit  Medication Sig Dispense Refill  . bethanechol (URECHOLINE) 10 MG tablet Take 10-20 mg by mouth 4 (four) times daily as needed. For urinary retention.      . clopidogrel (PLAVIX)  75 MG tablet TAKE 1 TABLET DAILY  90 tablet  3  . conjugated estrogens (PREMARIN) vaginal cream Place 1 g vaginally daily as needed. For hot flashes.      . cyanocobalamin (,VITAMIN B-12,) 1000 MCG/ML injection Inject 1,000 mcg into the muscle every 30 (thirty) days.      . methimazole (TAPAZOLE) 10 MG tablet Take 1 tablet (10 mg total) by mouth daily.  30 tablet  2  . nitroGLYCERIN (NITROSTAT) 0.4 MG SL tablet Place 0.4 mg under the tongue every 5 (five) minutes as needed. For chest pain.      . pantoprazole (PROTONIX) 40 MG tablet Take 1 tablet (40 mg total) by mouth daily.  30 tablet  0  . pravastatin (PRAVACHOL) 20 MG tablet TAKE 1 TABLET EVERY EVENING  90 tablet  3  . triamcinolone ointment (KENALOG) 0.1 % Apply 1 application topically 2 (two)  times daily as needed. For dry skin.      Marland Kitchen traMADol (ULTRAM) 50 MG tablet Take 1 tablet (50 mg total) by mouth every 6 (six) hours as needed. For pain.  60 tablet  1   No current facility-administered medications on file prior to visit.    BP 140/74  Pulse 72  Temp(Src) 97.6 F (36.4 C) (Oral)  Resp 20  Wt 116 lb (52.617 kg)  BMI 19.3 kg/m2  SpO2 97%     Wt Readings from Last 3 Encounters:  03/17/12 116 lb (52.617 kg)  02/12/12 117 lb (53.071 kg)  02/07/12 116 lb (52.617 kg)    Review of Systems  HENT: Negative for hearing loss, congestion, sore throat, rhinorrhea, dental problem, sinus pressure and tinnitus.   Eyes: Negative for pain, discharge and visual disturbance.  Respiratory: Negative for cough and shortness of breath.   Cardiovascular: Negative for chest pain, palpitations and leg swelling.  Gastrointestinal: Positive for abdominal pain. Negative for nausea, vomiting, diarrhea, constipation, blood in stool and abdominal distention.  Genitourinary: Negative for dysuria, urgency, frequency, hematuria, flank pain, vaginal bleeding, vaginal discharge, difficulty urinating, vaginal pain and pelvic pain.  Musculoskeletal: Negative for joint swelling, arthralgias and gait problem.  Skin: Negative for rash.  Neurological: Positive for weakness. Negative for dizziness, syncope, speech difficulty, numbness and headaches.  Hematological: Negative for adenopathy.  Psychiatric/Behavioral: Positive for dysphoric mood. Negative for behavioral problems and agitation. The patient is nervous/anxious.        Objective:   Physical Exam  Constitutional: She is oriented to person, place, and time. She appears well-developed and well-nourished.  HENT:  Head: Normocephalic.  Right Ear: External ear normal.  Left Ear: External ear normal.  Mouth/Throat: Oropharynx is clear and moist.  Eyes: Conjunctivae and EOM are normal. Pupils are equal, round, and reactive to light.  Neck: Normal  range of motion. Neck supple. No thyromegaly present.  Cardiovascular: Normal rate, regular rhythm, normal heart sounds and intact distal pulses.   Pulmonary/Chest: Effort normal and breath sounds normal.  Abdominal: Soft. Bowel sounds are normal. She exhibits no mass. There is no tenderness.  Musculoskeletal: Normal range of motion.  Lymphadenopathy:    She has no cervical adenopathy.  Neurological: She is alert and oriented to person, place, and time.  Skin: Skin is warm and dry. No rash noted.  Psychiatric: She has a normal mood and affect. Her behavior is normal.          Assessment & Plan:   Hypertension well controlled Pernicious anemia Coronary artery disease stable Hyperfunctioning thyroid nodule. Stable. Followup endocrinology  Recheck here 6 months

## 2012-03-17 NOTE — Patient Instructions (Addendum)
It is important that you exercise regularly, at least 20 minutes 3 to 4 times per week.  If you develop chest pain or shortness of breath seek  medical attention.  Return in 6 months for follow-up  Endocrinology followup

## 2012-03-21 ENCOUNTER — Other Ambulatory Visit: Payer: Self-pay

## 2012-03-26 NOTE — Telephone Encounter (Signed)
Left message w/ sister to call before coming to ensure we have b12 and to please keep the 11:45 appt time.

## 2012-04-14 ENCOUNTER — Ambulatory Visit: Payer: Medicare Other | Admitting: Endocrinology

## 2012-04-15 ENCOUNTER — Ambulatory Visit (INDEPENDENT_AMBULATORY_CARE_PROVIDER_SITE_OTHER): Payer: Medicare Other | Admitting: Internal Medicine

## 2012-04-15 DIAGNOSIS — E538 Deficiency of other specified B group vitamins: Secondary | ICD-10-CM

## 2012-04-15 MED ORDER — CYANOCOBALAMIN 1000 MCG/ML IJ SOLN
1000.0000 ug | Freq: Once | INTRAMUSCULAR | Status: AC
  Administered 2012-04-15: 1000 ug via INTRAMUSCULAR

## 2012-04-21 ENCOUNTER — Ambulatory Visit (INDEPENDENT_AMBULATORY_CARE_PROVIDER_SITE_OTHER): Payer: Medicare Other | Admitting: Endocrinology

## 2012-04-21 ENCOUNTER — Encounter: Payer: Self-pay | Admitting: Endocrinology

## 2012-04-21 VITALS — BP 126/70 | HR 69 | Wt 119.0 lb

## 2012-04-21 DIAGNOSIS — E0590 Thyrotoxicosis, unspecified without thyrotoxic crisis or storm: Secondary | ICD-10-CM

## 2012-04-21 LAB — T4, FREE: Free T4: 0.78 ng/dL (ref 0.60–1.60)

## 2012-04-21 NOTE — Progress Notes (Signed)
Subjective:    Patient ID: [name], female    DOB: [birth year], 79 y.o.   MRN: [id number]  HPI Pt was noted in the hospital in sept, 2013, to have hyperthyroidism.  She was started on tapazole.  She could tolerate only 10 mg of tapazole per day, due to GI sxs.  Since on the tapazole, she feels no different.  Her main symptom continues to be generalized weakness.  Past Medical History  Diagnosis Date  . ANEMIA, PERNICIOUS 09/10/2006  . CORONARY ARTERY DISEASE 11/07/2008  . CVA WITH LEFT HEMIPARESIS 07/22/2007    May, 2009, neurology decided aspirin and Plavix at that time.  . DERMATITIS 03/02/2007  . HYPERTENSION 07/15/2006  . MENINGIOMA 08/27/2006    Occipital meningioma, surgery, [facility], 2008 /  MRI, [place], June, 2011, no change in lesion, history believe the patient had a gamma knife treatment of a right occipital atypical meningioma  . OSTEOARTHRITIS 07/15/2006  . PULMONARY NODULE 01/14/2007    [facility], December, 2011  . Takotsubo syndrome 04/22/2008    MI, May, 2008, question takotsubo event  . THYROID NODULE 01/14/2007  . IBS (irritable bowel syndrome)   . DJD (degenerative joint disease)   . Ejection fraction     EF 60%, echo, July, 2010, (  EF improved after tach a suitable event 2008)  . Subarachnoid hemorrhage following injury 2007    Subarachnoid hemorrhage secondary to a fall December, 2007  . Bradycardia     August, 2013  . Stroke   . Family history of anesthesia complication     daughter has a hard time waking up  . Myocardial infarction   . Shortness of breath   . H/O hiatal hernia   . UTI (lower urinary tract infection) 10/14/2011    Past Surgical History  Procedure Laterality Date  . Cholecystectomy    . Abdominal hysterectomy    . Hip surgery    . Brain tumor excision      Meningioma; treated at [facility] in 2008  . Shoulder surgery    . Joint replacement      rt THR    History   Social History  . Marital Status: Widowed    Spouse Name: N/A     Number of Children: 4  . Years of Education: N/A   Occupational History  .     Social History Main Topics  . Smoking status: Never Smoker   . Smokeless tobacco: Never Used  . Alcohol Use: No  . Drug Use: No  . Sexually Active: No   Other Topics Concern  . Not on file   Social History Narrative   Widowed. Lives in [place], [place] with daughter.     Current Outpatient Prescriptions on File Prior to Visit  Medication Sig Dispense Refill  . bethanechol (URECHOLINE) 10 MG tablet Take 10-20 mg by mouth 4 (four) times daily as needed. For urinary retention.      . clopidogrel (PLAVIX) 75 MG tablet TAKE 1 TABLET DAILY  90 tablet  3  . conjugated estrogens (PREMARIN) vaginal cream Place 1 g vaginally daily as needed. For hot flashes.      . cyanocobalamin (,VITAMIN B-12,) 1000 MCG/ML injection Inject 1,000 mcg into the muscle every 30 (thirty) days.      . methimazole (TAPAZOLE) 10 MG tablet Take 1 tablet (10 mg total) by mouth daily.  30 tablet  2  . nitroGLYCERIN (NITROSTAT) 0.4 MG SL tablet Place 0.4 mg under the tongue every 5 (five) minutes  as needed. For chest pain.      . pantoprazole (PROTONIX) 40 MG tablet TAKE ONE TABLET BY MOUTH EVERY DAY  30 tablet  11  . pravastatin (PRAVACHOL) 20 MG tablet TAKE 1 TABLET EVERY EVENING  90 tablet  3  . traMADol (ULTRAM) 50 MG tablet Take 1 tablet (50 mg total) by mouth every 6 (six) hours as needed. For pain.  90 tablet  6  . triamcinolone ointment (KENALOG) 0.1 % Apply 1 application topically 2 (two) times daily as needed. For dry skin.       No current facility-administered medications on file prior to visit.    Allergies  Allergen Reactions  . Atorvastatin Other (See Comments)    Raises liver enzymes (takes pravastatin at home)  . Phenytoin Other (See Comments)    Blood clots  . Promethazine Hcl Other (See Comments)    Severe sedation  . Sulfacetamide Sodium     REACTION: sulfa  . Sulfamethoxazole     REACTION: unspecified     Family History  Problem Relation Age of Onset  . Heart attack Brother 84    BP 126/70  Pulse 69  Wt 119 lb (53.978 kg)  BMI 19.8 kg/m2  SpO2 98%  Review of Systems Denies fever    Objective:   Physical Exam VITAL SIGNS:  See vs page GENERAL: no distress NECK: There is no palpable thyroid enlargement.  No thyroid nodule is palpable.  No palpable lymphadenopathy at the anterior neck.      Assessment & Plan:  Hyperthyroidism, on rx

## 2012-04-21 NOTE — Patient Instructions (Addendum)
blood tests are being requested for you today.  We'll contact you with results.   Please come back for a follow-up appointment in 3 months. if ever you have fever while taking methimazole, stop it and call us, because of the risk of a rare side-effect.   

## 2012-04-29 ENCOUNTER — Encounter (HOSPITAL_COMMUNITY): Payer: Self-pay | Admitting: Emergency Medicine

## 2012-04-29 ENCOUNTER — Observation Stay (HOSPITAL_COMMUNITY): Payer: Medicare Other

## 2012-04-29 ENCOUNTER — Other Ambulatory Visit: Payer: Self-pay | Admitting: Family

## 2012-04-29 ENCOUNTER — Observation Stay (HOSPITAL_COMMUNITY)
Admission: EM | Admit: 2012-04-29 | Discharge: 2012-04-30 | Disposition: A | Discharge status: Home / Self Care | Payer: Medicare Other | Attending: Internal Medicine | Admitting: Internal Medicine

## 2012-04-29 ENCOUNTER — Emergency Department (HOSPITAL_COMMUNITY): Payer: Medicare Other

## 2012-04-29 DIAGNOSIS — D510 Vitamin B12 deficiency anemia due to intrinsic factor deficiency: Secondary | ICD-10-CM

## 2012-04-29 DIAGNOSIS — J984 Other disorders of lung: Secondary | ICD-10-CM

## 2012-04-29 DIAGNOSIS — R209 Unspecified disturbances of skin sensation: Secondary | ICD-10-CM | POA: Insufficient documentation

## 2012-04-29 DIAGNOSIS — M1990 Unspecified osteoarthritis, unspecified site: Secondary | ICD-10-CM

## 2012-04-29 DIAGNOSIS — D320 Benign neoplasm of cerebral meninges: Secondary | ICD-10-CM

## 2012-04-29 DIAGNOSIS — I10 Essential (primary) hypertension: Secondary | ICD-10-CM

## 2012-04-29 DIAGNOSIS — L259 Unspecified contact dermatitis, unspecified cause: Secondary | ICD-10-CM

## 2012-04-29 DIAGNOSIS — IMO0002 Reserved for concepts with insufficient information to code with codable children: Secondary | ICD-10-CM

## 2012-04-29 DIAGNOSIS — R42 Dizziness and giddiness: Secondary | ICD-10-CM

## 2012-04-29 DIAGNOSIS — R079 Chest pain, unspecified: Principal | ICD-10-CM

## 2012-04-29 DIAGNOSIS — I480 Paroxysmal atrial fibrillation: Secondary | ICD-10-CM

## 2012-04-29 DIAGNOSIS — E785 Hyperlipidemia, unspecified: Secondary | ICD-10-CM

## 2012-04-29 DIAGNOSIS — I69959 Hemiplegia and hemiparesis following unspecified cerebrovascular disease affecting unspecified side: Secondary | ICD-10-CM

## 2012-04-29 DIAGNOSIS — R0789 Other chest pain: Secondary | ICD-10-CM

## 2012-04-29 DIAGNOSIS — I5032 Chronic diastolic (congestive) heart failure: Secondary | ICD-10-CM | POA: Insufficient documentation

## 2012-04-29 DIAGNOSIS — R001 Bradycardia, unspecified: Secondary | ICD-10-CM

## 2012-04-29 DIAGNOSIS — R9430 Abnormal result of cardiovascular function study, unspecified: Secondary | ICD-10-CM

## 2012-04-29 DIAGNOSIS — E0590 Thyrotoxicosis, unspecified without thyrotoxic crisis or storm: Secondary | ICD-10-CM

## 2012-04-29 DIAGNOSIS — N390 Urinary tract infection, site not specified: Secondary | ICD-10-CM

## 2012-04-29 DIAGNOSIS — R0600 Dyspnea, unspecified: Secondary | ICD-10-CM

## 2012-04-29 DIAGNOSIS — I5181 Takotsubo syndrome: Secondary | ICD-10-CM

## 2012-04-29 DIAGNOSIS — R531 Weakness: Secondary | ICD-10-CM

## 2012-04-29 DIAGNOSIS — E041 Nontoxic single thyroid nodule: Secondary | ICD-10-CM

## 2012-04-29 DIAGNOSIS — R1310 Dysphagia, unspecified: Secondary | ICD-10-CM

## 2012-04-29 DIAGNOSIS — Z79899 Other long term (current) drug therapy: Secondary | ICD-10-CM

## 2012-04-29 DIAGNOSIS — R0602 Shortness of breath: Secondary | ICD-10-CM | POA: Insufficient documentation

## 2012-04-29 DIAGNOSIS — R002 Palpitations: Secondary | ICD-10-CM

## 2012-04-29 DIAGNOSIS — I2510 Atherosclerotic heart disease of native coronary artery without angina pectoris: Secondary | ICD-10-CM

## 2012-04-29 DIAGNOSIS — K219 Gastro-esophageal reflux disease without esophagitis: Secondary | ICD-10-CM

## 2012-04-29 HISTORY — DX: Abnormal findings on diagnostic imaging of other specified body structures: R93.89

## 2012-04-29 LAB — CBC
HCT: 31.1 % — ABNORMAL LOW (ref 36.0–46.0)
MCH: 30 pg (ref 26.0–34.0)
MCHC: 33.4 g/dL (ref 30.0–36.0)
MCV: 89.6 fL (ref 78.0–100.0)
RDW: 14 % (ref 11.5–15.5)

## 2012-04-29 LAB — CBC WITH DIFFERENTIAL/PLATELET
Eosinophils Absolute: 0.2 10*3/uL (ref 0.0–0.7)
Eosinophils Relative: 3 % (ref 0–5)
HCT: 33.8 % — ABNORMAL LOW (ref 36.0–46.0)
Lymphs Abs: 1.1 10*3/uL (ref 0.7–4.0)
MCH: 29.7 pg (ref 26.0–34.0)
MCV: 89.7 fL (ref 78.0–100.0)
Monocytes Absolute: 0.7 10*3/uL (ref 0.1–1.0)
Monocytes Relative: 12 % (ref 3–12)
Platelets: 244 10*3/uL (ref 150–400)
RBC: 3.77 MIL/uL — ABNORMAL LOW (ref 3.87–5.11)

## 2012-04-29 LAB — BASIC METABOLIC PANEL
CO2: 29 mEq/L (ref 19–32)
Chloride: 101 mEq/L (ref 96–112)
Creatinine, Ser: 0.88 mg/dL (ref 0.50–1.10)
Glucose, Bld: 120 mg/dL — ABNORMAL HIGH (ref 70–99)

## 2012-04-29 LAB — POCT I-STAT TROPONIN I: Troponin i, poc: 0 ng/mL (ref 0.00–0.08)

## 2012-04-29 MED ORDER — SODIUM CHLORIDE 0.9 % IJ SOLN
3.0000 mL | Freq: Two times a day (BID) | INTRAMUSCULAR | Status: DC
  Administered 2012-04-29 – 2012-04-30 (×2): 3 mL via INTRAVENOUS

## 2012-04-29 MED ORDER — SODIUM CHLORIDE 0.9 % IV SOLN: 250.0000 mL | INTRAVENOUS | Status: DC | PRN

## 2012-04-29 MED ORDER — METHIMAZOLE 10 MG PO TABS
10.0000 mg | ORAL_TABLET | Freq: Every day | ORAL | Status: DC
Start: 2012-04-30 — End: 2012-04-30
  Administered 2012-04-30: 10 mg via ORAL
  Filled 2012-04-29: qty 1

## 2012-04-29 MED ORDER — ACETAMINOPHEN 325 MG PO TABS: 650.0000 mg | ORAL_TABLET | Freq: Four times a day (QID) | ORAL | Status: DC | PRN

## 2012-04-29 MED ORDER — CLOPIDOGREL BISULFATE 75 MG PO TABS
75.0000 mg | ORAL_TABLET | Freq: Every day | ORAL | Status: DC
  Administered 2012-04-30: 75 mg via ORAL
  Filled 2012-04-29: qty 1

## 2012-04-29 MED ORDER — ONDANSETRON HCL 4 MG PO TABS: 4.0000 mg | ORAL_TABLET | Freq: Four times a day (QID) | ORAL | Status: DC | PRN

## 2012-04-29 MED ORDER — SODIUM CHLORIDE 0.9 % IJ SOLN: 3.0000 mL | INTRAMUSCULAR | Status: DC | PRN

## 2012-04-29 MED ORDER — HYDROCODONE-ACETAMINOPHEN 5-325 MG PO TABS: 1.0000 | ORAL_TABLET | ORAL | Status: DC | PRN

## 2012-04-29 MED ORDER — TRAMADOL HCL 50 MG PO TABS: 50.0000 mg | ORAL_TABLET | Freq: Four times a day (QID) | ORAL | Status: DC | PRN

## 2012-04-29 MED ORDER — MORPHINE SULFATE 2 MG/ML IJ SOLN
2.0000 mg | Freq: Once | INTRAMUSCULAR | Status: DC
  Filled 2012-04-29: qty 1

## 2012-04-29 MED ORDER — ACETAMINOPHEN 650 MG RE SUPP: 650.0000 mg | Freq: Four times a day (QID) | RECTAL | Status: DC | PRN

## 2012-04-29 MED ORDER — MORPHINE SULFATE 2 MG/ML IJ SOLN: 1.0000 mg | INTRAMUSCULAR | Status: DC | PRN

## 2012-04-29 MED ORDER — PANTOPRAZOLE SODIUM 40 MG PO TBEC
40.0000 mg | DELAYED_RELEASE_TABLET | Freq: Every day | ORAL | Status: DC
  Administered 2012-04-30: 40 mg via ORAL
  Filled 2012-04-29: qty 1

## 2012-04-29 MED ORDER — POLYETHYLENE GLYCOL 3350 17 G PO PACK
17.0000 g | PACK | Freq: Every day | ORAL | Status: DC | PRN
  Filled 2012-04-29: qty 1

## 2012-04-29 MED ORDER — NITROGLYCERIN 2 % TD OINT
0.5000 [in_us] | TOPICAL_OINTMENT | Freq: Four times a day (QID) | TRANSDERMAL | Status: DC
  Administered 2012-04-29 – 2012-04-30 (×2): 0.5 [in_us] via TOPICAL
  Filled 2012-04-29: qty 30

## 2012-04-29 MED ORDER — ALUM & MAG HYDROXIDE-SIMETH 200-200-20 MG/5ML PO SUSP
30.0000 mL | Freq: Four times a day (QID) | ORAL | Status: DC | PRN
  Filled 2012-04-29: qty 30

## 2012-04-29 MED ORDER — ONDANSETRON HCL 4 MG/2ML IJ SOLN
4.0000 mg | Freq: Once | INTRAMUSCULAR | Status: DC
  Filled 2012-04-29: qty 2

## 2012-04-29 MED ORDER — ONDANSETRON HCL 4 MG/2ML IJ SOLN: 4.0000 mg | Freq: Four times a day (QID) | INTRAMUSCULAR | Status: DC | PRN

## 2012-04-29 MED ORDER — SODIUM CHLORIDE 0.9 % IJ SOLN: 3.0000 mL | Freq: Two times a day (BID) | INTRAMUSCULAR | Status: DC

## 2012-04-29 MED ORDER — ENOXAPARIN SODIUM 30 MG/0.3ML ~~LOC~~ SOLN
30.0000 mg | SUBCUTANEOUS | Status: DC
  Filled 2012-04-29 (×2): qty 0.3

## 2012-04-29 NOTE — ED Provider Notes (Signed)
History     CSN: [id number]  Arrival date & time 04/29/12  1742   First MD Initiated Contact with Patient 04/29/12 1754      Chief Complaint  Patient presents with  . Chest Pain     Patient is a 79 y.o. female presenting with chest pain. The history is provided by the patient.  Chest Pain Pain location:  Substernal area Pain quality: pressure and stabbing   Pain radiates to:  Does not radiate Pain radiates to the back: no   Pain severity:  Moderate Onset quality:  Gradual Duration:  2 hours Timing:  Intermittent Progression:  Improving Chronicity:  Recurrent Relieved by:  Nitroglycerin Worsened by:  Nothing tried Associated symptoms: shortness of breath   Associated symptoms: no abdominal pain, no cough, no fever, no syncope and no weakness     Past Medical History  Diagnosis Date  . ANEMIA, PERNICIOUS 09/10/2006  . CORONARY ARTERY DISEASE 11/07/2008  . CVA WITH LEFT HEMIPARESIS 07/22/2007    May, 2009, neurology decided aspirin and Plavix at that time.  . DERMATITIS 03/02/2007  . HYPERTENSION 07/15/2006  . MENINGIOMA 08/27/2006    Occipital meningioma, surgery, [facility], 2008 /  MRI, [place], June, 2011, no change in lesion, history believe the patient had a gamma knife treatment of a right occipital atypical meningioma  . OSTEOARTHRITIS 07/15/2006  . PULMONARY NODULE 01/14/2007    [facility], December, 2011  . Takotsubo syndrome 04/22/2008    MI, May, 2008, question takotsubo event  . THYROID NODULE 01/14/2007  . IBS (irritable bowel syndrome)   . DJD (degenerative joint disease)   . Ejection fraction     EF 60%, echo, July, 2010, (  EF improved after tach a suitable event 2008)  . Subarachnoid hemorrhage following injury 2007    Subarachnoid hemorrhage secondary to a fall December, 2007  . Bradycardia     August, 2013  . Stroke   . Family history of anesthesia complication     daughter has a hard time waking up  . Myocardial infarction   . Shortness of breath   . H/O  hiatal hernia   . UTI (lower urinary tract infection) 10/14/2011    Past Surgical History  Procedure Laterality Date  . Cholecystectomy    . Abdominal hysterectomy    . Hip surgery    . Brain tumor excision      Meningioma; treated at [facility] in 2008  . Shoulder surgery    . Joint replacement      rt THR    Family History  Problem Relation Age of Onset  . Heart attack Brother 29    History  Substance Use Topics  . Smoking status: Never Smoker   . Smokeless tobacco: Never Used  . Alcohol Use: No    OB History   Grav Para Term Preterm Abortions TAB SAB Ect Mult Living                  Review of Systems  Constitutional: Negative for fever.  Respiratory: Positive for shortness of breath. Negative for cough.   Cardiovascular: Positive for chest pain. Negative for syncope.  Gastrointestinal: Negative for abdominal pain.  Neurological: Negative for weakness.  All other systems reviewed and are negative.    Allergies  Atorvastatin; Phenytoin; Promethazine hcl; Sulfacetamide sodium; and Sulfamethoxazole  Home Medications   Current Outpatient Rx  Name  Route  Sig  Dispense  Refill  . clopidogrel (PLAVIX) 75 MG tablet   Oral  Take 75 mg by mouth daily.         . cyanocobalamin (,VITAMIN B-12,) 1000 MCG/ML injection   Intramuscular   Inject 1,000 mcg into the muscle every 30 (thirty) days.         . methimazole (TAPAZOLE) 10 MG tablet   Oral   Take 10 mg by mouth daily.         . nitroGLYCERIN (NITROSTAT) 0.4 MG SL tablet   Sublingual   Place 0.4 mg under the tongue every 5 (five) minutes as needed. For chest pain.         . pravastatin (PRAVACHOL) 20 MG tablet   Oral   Take 20 mg by mouth daily.         . traMADol (ULTRAM) 50 MG tablet   Oral   Take 50 mg by mouth every 6 (six) hours as needed for pain. For pain.         Marland Kitchen triamcinolone ointment (KENALOG) 0.1 %   Topical   Apply 1 application topically 2 (two) times daily as needed. For  dry skin.           BP 122/63  Pulse 67  Temp(Src) 98.4 F (36.9 C) (Oral)  Resp 26  SpO2 98%  Physical Exam CONSTITUTIONAL: Frail, elderly HEAD: Normocephalic/atraumatic EYES: EOMI/PERRL ENMT: Mucous membranes moist NECK: supple no meningeal signs SPINE:entire spine nontender CV: S1/S2 noted, no murmurs/rubs/gallops noted Chest - central chest is not tender to palpation LUNGS: Lungs are clear to auscultation bilaterally, no apparent distress ABDOMEN: soft, nontender, no rebound or guarding GU:no cva tenderness NEURO: Pt is awake/alert, moves all extremitiesx4, no arm or leg drift is noted EXTREMITIES: pulses normal, full ROM SKIN: warm, color normal PSYCH: no abnormalities of mood noted  ED Course  Procedures (including critical care time)  Labs Reviewed  BASIC METABOLIC PANEL - Abnormal; Notable for the following:    Glucose, Bld 120 (*)    GFR calc non Af Amer 57 (*)    GFR calc Af Amer 66 (*)    All other components within normal limits  CBC WITH DIFFERENTIAL - Abnormal; Notable for the following:    RBC 3.77 (*)    Hemoglobin 11.2 (*)    HCT 33.8 (*)    All other components within normal limits  POCT I-STAT TROPONIN I   6:50 PM Pt reports she can not take ASA, she is on plavix 8:06 PM Pt now CP free She reports two episodes of CP today that were >1 hr She report some improvement earlier from NTG She is clinically stable.  Denies pleuritic type pain, doubt PE or dissection  d/w hospitalist, will admit  MDM  Nursing notes including past medical history and social history reviewed and considered in documentation Labs/vital reviewed and considered xrays reviewed and considered        Date: 04/29/2012  Rate: 61  Rhythm: normal sinus rhythm  QRS Axis: normal  Intervals: normal  ST/T Wave abnormalities: nonspecific ST changes  Conduction Disutrbances:none  Narrative Interpretation:   Old EKG Reviewed: unchanged    Joya Gaskins,  MD 04/29/12 2007

## 2012-04-29 NOTE — ED Notes (Addendum)
Pt c/o central cp, states "feels like someone is stabbing me"  Pt denies n/v, but had sob. Pt rates pain 9/10. Pt taking short shallow breaths.

## 2012-04-29 NOTE — H&P (Addendum)
PCP:   Sarah Boga, MD  Cardiology: [place]  Chief Complaint:  Chest pain  HPI: This is a 79 year old female who at 10 AM this morning developed chest pain and felt presyncopal. Chest pain was centrally located without radiation. She states she went on to develop bilateral arm numbness worse on the left, she states she felt as though ants were crawling over her arms. She states some of the numbness remains in her left arm on the fourth and fifth digits. This morning she had no shortness of breath, no diaphoresis, no palpitations. She described the pain as sharp at its worst 9/10. She is unclear the duration, however, it eventually self resolved. This evening at approximately 5 PM she again developed left-sided chest pains, this time associated with diaphoresis and shortness of breath. She states she was very shaky. She denies any palpitation, wheeze, cough, lower extremity edema or GERD. 911 was called, enroute she received 3 nitroglycerin sublingually which she states may have helped. Her chest pain did resolve while in the ER space but started to recur during my interview. The patient states she's had chest pains 3 episodes in the past year [she was admitted], I'm remain unclear the workup, there is no recent stress test in Epic. She does not report a history of atrial fibrillation, EKG done in the ER shows a fib. She does have a history of Takasubo syndrome but no actual coronary artery disease. History provided by the patient as well as her daughter who is at the bedside.  Review of Systems:  The patient denies anorexia, fever, weight loss,, vision loss, decreased hearing, hoarseness, syncope, dyspnea on exertion, peripheral edema, balance deficits, hemoptysis, abdominal pain, melena, hematochezia, severe indigestion/heartburn, hematuria, incontinence, genital sores, muscle weakness, suspicious skin lesions, transient blindness, difficulty walking, depression, unusual weight change,  abnormal bleeding, enlarged lymph nodes, angioedema, and breast masses.  Past Medical History: Past Medical History  Diagnosis Date  . ANEMIA, PERNICIOUS 09/10/2006  . CORONARY ARTERY DISEASE 11/07/2008  . CVA WITH LEFT HEMIPARESIS 07/22/2007    May, 2009, neurology decided aspirin and Plavix at that time.  . DERMATITIS 03/02/2007  . HYPERTENSION 07/15/2006  . MENINGIOMA 08/27/2006    Occipital meningioma, surgery, [facility], 2008 /  MRI, [facility], June, 2011, no change in lesion, history believe the patient had a gamma knife treatment of a right occipital atypical meningioma  . OSTEOARTHRITIS 07/15/2006  . PULMONARY NODULE 01/14/2007    [facility], December, 2011  . Takotsubo syndrome 04/22/2008    MI, May, 2008, question takotsubo event  . THYROID NODULE 01/14/2007  . IBS (irritable bowel syndrome)   . DJD (degenerative joint disease)   . Ejection fraction     EF 60%, echo, July, 2010, (  EF improved after tach a suitable event 2008)  . Subarachnoid hemorrhage following injury 2007    Subarachnoid hemorrhage secondary to a fall December, 2007  . Bradycardia     August, 2013  . Stroke   . Family history of anesthesia complication     daughter has a hard time waking up  . Myocardial infarction   . Shortness of breath   . H/O hiatal hernia   . UTI (lower urinary tract infection) 10/14/2011   Past Surgical History  Procedure Laterality Date  . Cholecystectomy    . Abdominal hysterectomy    . Hip surgery    . Brain tumor excision      Meningioma; treated at [facility] in 2008  . Shoulder surgery    .  Joint replacement      rt THR    Medications: Prior to Admission medications   Medication Sig Start Date End Date Taking? Authorizing Provider  clopidogrel (PLAVIX) 75 MG tablet Take 75 mg by mouth daily.   Yes Historical Provider, MD  cyanocobalamin (,VITAMIN B-12,) 1000 MCG/ML injection Inject 1,000 mcg into the muscle every 30 (thirty) days.   Yes Historical Provider, MD  methimazole  (TAPAZOLE) 10 MG tablet Take 10 mg by mouth daily. 01/11/12  Yes Romero Belling, MD  nitroGLYCERIN (NITROSTAT) 0.4 MG SL tablet Place 0.4 mg under the tongue every 5 (five) minutes as needed. For chest pain.   Yes Historical Provider, MD  pravastatin (PRAVACHOL) 20 MG tablet Take 20 mg by mouth daily.   Yes Historical Provider, MD  traMADol (ULTRAM) 50 MG tablet Take 50 mg by mouth every 6 (six) hours as needed for pain. For pain. 03/17/12  Yes Gordy Savers, MD  triamcinolone ointment (KENALOG) 0.1 % Apply 1 application topically 2 (two) times daily as needed. For dry skin.   Yes Historical Provider, MD    Allergies:   Allergies  Allergen Reactions  . Atorvastatin Other (See Comments)    Raises liver enzymes (takes pravastatin at home)  . Phenytoin Other (See Comments)    Blood clots  . Promethazine Hcl Other (See Comments)    Severe sedation  . Sulfacetamide Sodium     REACTION: sulfa  . Sulfamethoxazole     REACTION: unspecified    Social History:  reports that she has never smoked. She has never used smokeless tobacco. She reports that she does not drink alcohol or use illicit drugs.uses a cane  Family History: Family History  Problem Relation Age of Onset  . Heart attack Brother 84    Physical Exam: Filed Vitals:   04/29/12 1759 04/29/12 1942  BP: 122/63 135/51  Pulse: 67 57  Temp: 98.4 F (36.9 C)   TempSrc: Oral   Resp: 26 21  SpO2: 98% 100%    General:  Alert and oriented times three, well developed and nourished, no acute distress Eyes: PERRLA, pink conjunctiva, no scleral icterus ENT: Moist oral mucosa, neck supple, no thyromegaly Lungs: clear to ascultation, no wheeze, no crackles, no use of accessory muscles Cardiovascular: regular rate and rhythm, no regurgitation, no gallops, no murmurs. No carotid bruits, no JVD Abdomen: soft, positive BS, non-tender, non-distended, no organomegaly, not an acute abdomen GU: not examined Neuro: CN II - XII grossly  intact, sensation intact Musculoskeletal: strength 5/5 all extremities, no clubbing, cyanosis or edema, some reproducible left chest wall pain [patient states different pain] Skin: no rash, no subcutaneous crepitation, no decubitus Psych: appropriate patient   Labs on Admission:   Recent Labs  04/29/12 1759  NA 138  K 4.3  CL 101  CO2 29  GLUCOSE 120*  BUN 14  CREATININE 0.88  CALCIUM 9.5   Results for AMARRI, MICHAELSON (MRN [id number]) as of 04/29/2012 20:42  Ref. Range 04/29/2012 18:28  Troponin i, poc Latest Range: 0.00-0.08 ng/mL 0.00    Recent Labs  04/29/12 1759  WBC 6.0  NEUTROABS 4.0  HGB 11.2*  HCT 33.8*  MCV 89.7  PLT 244     Micro Results: No results found for this or any previous visit (from the past 240 hour(s)).   Radiological Exams on Admission: Dg Chest Portable 1 View  04/29/2012  *RADIOLOGY REPORT*  Clinical Data: Chest pain.  PORTABLE CHEST - 1 VIEW  Comparison: 11/16/2011.  Findings: The cardiac silhouette, mediastinal and hilar contours are stable.  There is tortuosity and calcification of the thoracic aorta.  The lungs are clear.  No pleural effusion.  The bony thorax is intact.  IMPRESSION: No acute cardiopulmonary findings.   Original Report Authenticated By: Rudie Meyer, M.D.    A fib rate 61  Assessment/Plan Present on Admission:  . atypical Chest pain  . history ofTakotsubo syndrome [2008] Bringing 23 hour observation Cycle cardiac enzymes, lipid panel in the a.m., nitroglycerin paste. Defer to a.m. Team stress test Continue Plavix Protonix added for possible GERD as patient's discomfort is now epigastric in location Numbness left arm/Formication Unclear etiology, will get a CT of the head especially as patient still reports some numbness in the left fingers  Hyperthyroidism  Continue methimazole, check TSH  Paroxysmal atrial fibrillation Noted on the EKG done in ED, resolved by the time of my examination. Patient will be monitored in  telemetry . HYPERTENSION . Hyperlipidemia CVA History of meningioma Stable home medications resumed  Full code DVT prophylaxis  Time in 7 p.m. Time out 7 35 PM  Clement Deneault 04/29/2012, 8:41 PM

## 2012-04-29 NOTE — ED Notes (Signed)
Per EMS pt c/o central CP with no radiation that started this morning around 10am, described as a pressure. Pt took one nitro and had no relief. Pt called EMS around 5pm for sob and cp she took 1 nitro prior to EMS arrival and was given 3 more rated pain 7/10 had some relief with nitro not much. Pt not given asa because pt is on plavix. 20g LH. Pt has hx of stroke, and MI.

## 2012-04-30 ENCOUNTER — Encounter (HOSPITAL_COMMUNITY): Payer: Self-pay | Admitting: Physician Assistant

## 2012-04-30 DIAGNOSIS — E0590 Thyrotoxicosis, unspecified without thyrotoxic crisis or storm: Secondary | ICD-10-CM

## 2012-04-30 DIAGNOSIS — D320 Benign neoplasm of cerebral meninges: Secondary | ICD-10-CM

## 2012-04-30 DIAGNOSIS — R079 Chest pain, unspecified: Principal | ICD-10-CM

## 2012-04-30 DIAGNOSIS — D510 Vitamin B12 deficiency anemia due to intrinsic factor deficiency: Secondary | ICD-10-CM

## 2012-04-30 DIAGNOSIS — E785 Hyperlipidemia, unspecified: Secondary | ICD-10-CM

## 2012-04-30 DIAGNOSIS — I10 Essential (primary) hypertension: Secondary | ICD-10-CM

## 2012-04-30 DIAGNOSIS — I4891 Unspecified atrial fibrillation: Secondary | ICD-10-CM

## 2012-04-30 DIAGNOSIS — I498 Other specified cardiac arrhythmias: Secondary | ICD-10-CM

## 2012-04-30 DIAGNOSIS — K219 Gastro-esophageal reflux disease without esophagitis: Secondary | ICD-10-CM

## 2012-04-30 LAB — BASIC METABOLIC PANEL
BUN: 12 mg/dL (ref 6–23)
CO2: 28 mEq/L (ref 19–32)
Chloride: 107 mEq/L (ref 96–112)
Creatinine, Ser: 0.86 mg/dL (ref 0.50–1.10)
Glucose, Bld: 94 mg/dL (ref 70–99)

## 2012-04-30 LAB — TROPONIN I: Troponin I: <0.3 ng/mL (ref <0.30)

## 2012-04-30 LAB — CBC
HCT: 31.2 % — ABNORMAL LOW (ref 36.0–46.0)
MCH: 29.7 pg (ref 26.0–34.0)
MCV: 87.4 fL (ref 78.0–100.0)
RDW: 14.1 % (ref 11.5–15.5)
WBC: 5.9 10*3/uL (ref 4.0–10.5)

## 2012-04-30 LAB — CREATININE, SERUM: GFR calc Af Amer: 72 mL/min — ABNORMAL LOW (ref >90)

## 2012-04-30 MED ORDER — ISOSORBIDE MONONITRATE ER 30 MG PO TB24
30.0000 mg | ORAL_TABLET | Freq: Every day | ORAL | Status: DC
  Filled 2012-04-30: qty 1

## 2012-04-30 MED ORDER — SIMVASTATIN 5 MG PO TABS
5.0000 mg | ORAL_TABLET | Freq: Every day | ORAL | Status: DC
  Filled 2012-04-30: qty 1

## 2012-04-30 MED ORDER — PANTOPRAZOLE SODIUM 40 MG PO TBEC: 40.0000 mg | DELAYED_RELEASE_TABLET | Freq: Two times a day (BID) | ORAL | Status: DC

## 2012-04-30 MED ORDER — NITROGLYCERIN 0.4 MG SL SUBL: 0.4000 mg | SUBLINGUAL_TABLET | SUBLINGUAL | Status: DC | PRN

## 2012-04-30 MED ORDER — ISOSORBIDE MONONITRATE ER 30 MG PO TB24: 15.0000 mg | ORAL_TABLET | Freq: Every day | ORAL | Status: DC

## 2012-04-30 MED ORDER — SUCRALFATE 1 GM/10ML PO SUSP: 1.0000 g | Freq: Three times a day (TID) | ORAL | Status: DC

## 2012-04-30 MED ORDER — ENOXAPARIN SODIUM 40 MG/0.4ML ~~LOC~~ SOLN
40.0000 mg | Freq: Every day | SUBCUTANEOUS | Status: DC
  Filled 2012-04-30: qty 0.4

## 2012-04-30 MED ORDER — ALUM & MAG HYDROXIDE-SIMETH 200-200-20 MG/5ML PO SUSP
30.0000 mL | Freq: Once | ORAL | Status: AC
  Administered 2012-04-30: 30 mL via ORAL

## 2012-04-30 NOTE — Progress Notes (Signed)
PT Cancellation Note  Patient Details Name: [name] MRN: [id number] DOB: 01-01-1923   Cancelled Treatment:    Reason Eval/Treat Not Completed: Other (comment); patient in process of being discharged.   Miasha Emmons,CYNDI 04/30/2012, 4:19 PM

## 2012-04-30 NOTE — Discharge Summary (Signed)
Physician Discharge Summary  [name] AOZ:[id number] DOB: 01-Mar-1922 DOA: 04/29/2012  PCP: Rogelia Boga, MD  Admit date: 04/29/2012 Discharge date: 04/30/2012  Time spent: >30  minutes  Recommendations for Outpatient Follow-up:  1. BMET to follow electrolytes and renal function 2. Reassess BP and adjust medications as needed   Discharge Diagnoses:  Active Problems:   HYPERTENSION   Hyperlipidemia   CVA WITH LEFT HEMIPARESIS   Takotsubo syndrome   Chest pain at rest   PAF (paroxysmal atrial fibrillation) hyperthyroidism   Discharge Condition: stable and improved. Patient w/o SOB, chest discomfort, HA, lightheadedness or any other complaints. Will go home with [facility] services and family care (her daughter).  Diet recommendation: Heart healthy diet.  Filed Weights   04/29/12 2155  Weight: 53.025 kg (116 lb 14.4 oz)    History of present illness:  79 year old female with a history of CAD (s/p NSTEMI and STEMI in 2001 and 2008 respectively, medically managed, most recent cath in 2009 for chest pain revealed nonobstructive CAD with recommended medical management), Takotsubo syndrome (on 06/2006 cardiac cath with most recent EF 65-70% in 11/2011), CVA with left hemiparesis, pulmonary nodule, left shoulder orthopedic problems (left cervical facet and impingement syndromes), hyperthyroidism, HTN, and HLD who came into the hospital yesterday 04/29/12 with chest pain and SOB. The patient reports that her chest pain began yesterday morning while she was at rest, sitting and drinking a cup of tea. The pain was 9/10 substernal and sharp, without radiation but with associated bilateral upper extremity numbness. The pain lasted for about an hour and improved but did not completely go away. Later that evening around 5pm while at rest, the pain increased in severity to 10/10 with associated SOB and diaphoresis. She felt clammy and her hands were shaking. The pain was similar but less severe  than pain she previously had several years ago with MI. She took one SL nitro without relief of the pain, but when she checked the bottle it was expired in 2012. She called EMS and was given 3 nitro en route with mild pain relief. Given morphine and nitro paste in the ED. The nitro paste was removed 2 hours ago for hypotension around noon. During our interview, she developed 5/10 recurrent substernal chest pain without radiation, sharp in nature without associated EKG changes. She denies SOB, palpitations, numbness, presyncope, syncope, or other complaints.   Hospital Course:  1-Chest pain: relieved by NTG; cardiac enzymes negative. Had hx of CAD. Last 2-D echo in October 2013, EF 65% and grade 1 diastolic dysfunction. -no abnormalities on EKG -will change PPI BID  -continue plavix and statins; no B-blocker due to bradycardia -per cardiology recommendations will start imdur daily and continue PRN NTG. -no further inpatient/invasive work up for chest pain at this moment.   2-hyperthyroidism: continue tapazole  -recent TSH and free T4 WNL   3-GERD: continue PPI; dose changed to BID for better protection.   4-B12/pernicious anemia: continue monthly b12 injection.   5-HLD: continue statins   6-Diastolic heart failure: chronic and compensated. Last 2-D echo on October 2013 with preserved EF. Advised to follow a low sodium/heart healthy diet.  7-physical deconditioning and weakness: HH services will be arranged at discharge.  8-HTN: well controlled. Continue current regimen. Now that imdur has been added will require close follow up to prevent orthostasis   9-Hx of CVA: no new deficit. Continue plavix.   Procedures:  See below for x-ray reports.  Consultations:  cardiology  Discharge Exam: Filed  Vitals:   04/30/12 0448 04/30/12 0604 04/30/12 1135 04/30/12 1348  BP: 93/41 111/50 101/49 123/54  Pulse: 57   64  Temp: 97.9 F (36.6 C)   97.9 F (36.6 C)  TempSrc: Oral   Oral   Resp: 16     Height:      Weight:      SpO2: 97%   98%   General: NAD, afebrile  Cardiovascular: S1 and S2, sinus on telemetry; no rubs or gallops, positive SEM  Respiratory: no wheezing, no crackles and no rhonchi  Abdomen: soft, NT, ND, positive BS  Musculoskeletal: no joint swelling or erythema   Discharge Instructions  Discharge Orders   Future Appointments Provider Department Dept Phone   05/19/2012 11:45 AM Gordy Savers, MD [facility] [phone]   07/23/2012 10:45 AM Romero Belling, MD [facility] [phone]   09/08/2012 10:00 AM Gordy Savers, MD [facility] [place] [phone]   Future Orders Complete By Expires     Discharge instructions  As directed     Comments:      Follow a heart healthy diet Take medications as prescribed Arrange follow up with pcp in 2 weeks        Medication List    TAKE these medications       clopidogrel 75 MG tablet  Commonly known as:  PLAVIX  Take 75 mg by mouth daily.     cyanocobalamin 1000 MCG/ML injection  Commonly known as:  (VITAMIN B-12)  Inject 1,000 mcg into the muscle every 30 (thirty) days.     isosorbide mononitrate 30 MG 24 hr tablet  Commonly known as:  IMDUR  Take 0.5 tablets (15 mg total) by mouth daily.     methimazole 10 MG tablet  Commonly known as:  TAPAZOLE  Take 10 mg by mouth daily.     nitroGLYCERIN 0.4 MG SL tablet  Commonly known as:  NITROSTAT  Place 1 tablet (0.4 mg total) under the tongue every 5 (five) minutes as needed. For chest pain.     pantoprazole 40 MG tablet  Commonly known as:  PROTONIX  Take 1 tablet (40 mg total) by mouth 2 (two) times daily.     pravastatin 20 MG tablet  Commonly known as:  PRAVACHOL  Take 20 mg by mouth daily.     sucralfate 1 GM/10ML suspension  Commonly known as:  CARAFATE  Take 10 mLs (1 g total) by mouth 3 (three) times daily.     traMADol 50 MG tablet  Commonly known as:  ULTRAM   Take 50 mg by mouth every 6 (six) hours as needed for pain. For pain.     triamcinolone ointment 0.1 %  Commonly known as:  KENALOG  Apply 1 application topically 2 (two) times daily as needed. For dry skin.           Follow-up Information   Follow up with Rogelia Boga, MD. Schedule an appointment as soon as possible for a visit in 2 weeks.   Contact information:   [address] [place]ena Flake [place] [address] [phone]        The results of significant diagnostics from this hospitalization (including imaging, microbiology, ancillary and laboratory) are listed below for reference.    Significant Diagnostic Studies: Ct Head Wo Contrast  04/29/2012  *RADIOLOGY REPORT*  Clinical Data: Left arm numbness.  CT HEAD WITHOUT CONTRAST  Technique:  Contiguous axial images were obtained from the base of the skull  through the vertex without contrast.  Comparison: Head CT 10/14/2011.  Findings: Stable age related cerebral atrophy, ventriculomegaly and periventricular white matter disease.  Stable postoperative changes in the right occipital region with encephalomalacia.  No extra- axial fluid collections are identified.  No CT findings for hemispheric infarction or intracranial hemorrhage.  The brainstem and cerebellum are grossly normal and stable.  Vascular calcifications are noted.  The bony structures are intact.  Remote surgical changes noted on the right. The paranasal sinuses and mastoid air cells are clear. Globes are intact.  IMPRESSION:  1.  Stable postoperative changes in the right occipital area with encephalomalacia. 2.  Stable age related atrophy and periventricular white matter disease. 3.  No acute intracranial findings or mass lesions.   Original Report Authenticated By: Rudie Meyer, M.D.    Dg Chest Portable 1 View  04/29/2012  *RADIOLOGY REPORT*  Clinical Data: Chest pain.  PORTABLE CHEST - 1 VIEW  Comparison: 11/16/2011.  Findings: The cardiac silhouette, mediastinal  and hilar contours are stable.  There is tortuosity and calcification of the thoracic aorta.  The lungs are clear.  No pleural effusion.  The bony thorax is intact.  IMPRESSION: No acute cardiopulmonary findings.   Original Report Authenticated By: Rudie Meyer, M.D.    Labs: Basic Metabolic Panel:  Recent Labs Lab 04/29/12 1759 04/29/12 2302 04/30/12 0439  NA 138  --  141  K 4.3  --  4.2  CL 101  --  107  CO2 29  --  28  GLUCOSE 120*  --  94  BUN 14  --  12  CREATININE 0.88 0.82 0.86  CALCIUM 9.5  --  9.2   CBC:  Recent Labs Lab 04/29/12 1759 04/29/12 2302 04/30/12 0439  WBC 6.0 5.9 5.9  NEUTROABS 4.0  --   --   HGB 11.2* 10.4* 10.6*  HCT 33.8* 31.1* 31.2*  MCV 89.7 89.6 87.4  PLT 244 213 214   Cardiac Enzymes:  Recent Labs Lab 04/29/12 2302 04/30/12 0439 04/30/12 0912  TROPONINI <0.30 <0.30 <0.30   BNP: BNP (last 3 results)  Recent Labs  11/17/11 0009  PROBNP 545.1*    Signed:  Javaya [place]  [facility] 04/30/2012, 3:49 PM

## 2012-04-30 NOTE — Progress Notes (Signed)
DC orders received.  Patient stable with no S/S of distress.  Medication and discharge information reviewed with patient and patient's daughter.  Patient DC home. [place], Mitzi Hansen

## 2012-04-30 NOTE — Progress Notes (Addendum)
Patient C/O 5/10 CP and diaphoresis.  VS & EKG obtained.  Ronie Spies, PA notified.  Will continue to monitor. Freya Zobrist, Mitzi Hansen  Patient states her pain is now gone. [place], Mitzi Hansen

## 2012-04-30 NOTE — Consult Note (Signed)
CARDIOLOGY CONSULT NOTE  Patient ID: [name], MRN: [id number], DOB/AGE: 09-18-1922 79 y.o. Admit date: 04/29/2012   Date of Consult: 04/30/2012 Primary Physician: Rogelia Boga, MD Primary Cardiologist: Luis Abed, MD  Chief Complaint: chest pain, dyspnea Reason for Consult: chest pain, dyspnea  HPI: [name] is an 79 year old female with a history of CAD (s/p NSTEMI and STEMI in 2001 and 2008 respectively, medically managed, most recent cath in 2009 for chest pain revealed nonobstructive CAD with recommended medical management), Takotsubo syndrome (on 06/2006 cardiac cath with most recent EF 65-70% in 11/2011), CVA with left hemiparesis, pulmonary nodule, left shoulder orthopedic problems (left cervical facet and impingement syndromes), hyperthyroidism, HTN, and HLD who came into the hospital yesterday 04/29/12 with chest pain and SOB. Cardiology was asked to consult.   The patient reports that her chest pain began yesterday morning while she was at rest, sitting and drinking a cup of tea. The pain was 9/10 substernal and sharp, without radiation but with associated bilateral upper extremity numbness. The pain lasted for about an hour and improved but did not completely go away. Later that evening around 5pm while at rest, the pain increased in severity to 10/10 with associated SOB and diaphoresis. She felt clammy and her hands were shaking. The pain was similar but less severe than pain she previously had several years ago with MI. She took one SL nitro without relief of the pain, but when she checked the bottle it was expired in 2012. She called EMS and was given 3 nitro en route with mild pain relief. Given morphine and nitro paste in the ED. The nitro paste was removed 2 hours ago for hypotension around noon. During our interview, she developed 5/10 recurrent substernal chest pain without radiation, sharp in nature without associated EKG changes. She denies SOB, palpitations,  numbness, presyncope, syncope, or other complaints.   Her daily activity level includes household chores such as vacuuming, and she denies chest pain during those exertional activities. She has had weight loss with recent gain, which the patient attributes to recent inconsistent TSH levels??due to hx of hyperthyroidism. TSH is somewhat elevated here.  Past Medical History  Diagnosis Date  . ANEMIA, PERNICIOUS 09/10/2006  . CORONARY ARTERY DISEASE 11/07/2008    a. NSTEMI 2001 & STEMI 2008 managed medically. b. Takotsubo 06/2006. c. Cath 2009 -> med rx.  . CVA WITH LEFT HEMIPARESIS 07/22/2007    May, 2009, neurology decided aspirin and Plavix at that time.  . DERMATITIS 03/02/2007  . HYPERTENSION 07/15/2006  . MENINGIOMA 08/27/2006    Occipital meningioma, surgery, [facility], 2008 /  MRI, [place], June, 2011, no change in lesion, history believe the patient had a gamma knife treatment of a right occipital atypical meningioma  . OSTEOARTHRITIS 07/15/2006  . PULMONARY NODULE 01/14/2007    [place], December, 2011  . Takotsubo syndrome 04/22/2008    MI, May, 2008, question takotsubo event  . THYROID NODULE 01/14/2007  . IBS (irritable bowel syndrome)   . DJD (degenerative joint disease)     L shoulder impingement  . Ejection fraction     EF 60%, echo, July, 2010, (  EF improved after tach a suitable event 2008)  . Subarachnoid hemorrhage following injury 2007    Subarachnoid hemorrhage secondary to a fall December, 2007  . Bradycardia     August, 2013  . Family history of anesthesia complication     daughter has a hard time waking up  . H/O hiatal hernia   .  Abnormal CT of the chest     Stable patchy/nodular and ground-glass opacities in the right upper lobe, While grossly unchanged, low grade adenocarcinoma remains possible.      Most Recent Cardiac Studies: Cath 2009  PROCEDURE PERFORMED:  1. Left heart catheterization.  2. Selective coronary angiography.  3. Left ventricular angiogram.  4.  Placement of an Angio-Seal femoral artery closure device.  OPERATOR: Verne Carrow, MD  INDICATIONS: This is an 79 year old [place] female with a history of  nonobstructive coronary artery disease, as well as hypertension and  prior brain tumor removal, as well as intracranial hemorrhage who  presented to the hospital with complaints of chest pain over the last 4  days. The patient ruled out for a myocardial infarction, however, was  felt to be high risk for obstructive coronary artery disease.  DETAILS OF PROCEDURE: The patient was brought to the Catheterization  Laboratory after signing informed consent for the procedure. The right  groin was prepped and draped in a sterile fashion. Lidocaine 1% was  used for local anesthesia in the right groin. A 6-French sheath was  inserted into the right femoral artery. Standard 6-French diagnostic  catheters were used to selectively engage the left coronary system and  the right coronary artery. Following selective coronary angiography, a  6-French pigtail catheter was used to cross the aortic valve into the  left ventricle. Following the performance of the left ventricular  angiogram, the pigtail catheter was pulled back across the aortic valve  with no significant pressure gradient measured. Following the case, an  Angio-Seal femoral artery closure device was placed in the right femoral  artery. The patient tolerated the procedure well and was taken to the  recovery area in stable condition.  FINDINGS:  1. The left main coronary artery is a large vessel that bifurcates  into the circumflex, the LAD and a moderate size ramus branch.  There was no evidence of disease in the left main coronary artery.  2. The left anterior descending coronary artery is a large vessel that  courses to the apex. It gives off a large diagonal branch that is  bifurcating and has a 30% ostial lesion. There is 20-30% plaque  noted in the ostium on the LAD as well  as a 30% plaque noted in the  mid LAD. There is no obstructive disease noted in the LAD.  3. There is a moderate size short ramus intermedius branch that has an  ostial 40-50% stenosis.  4. Circumflex is a moderate-to-large size vessel that gives off three  large obtuse marginal branches. There is a 30% stenosis noted in  the proximal portion of the circumflex. The obtuse marginal  branches are free of disease.  5. The right coronary artery is a dominant vessel that has plaque  disease noted in the proximal portion and a 30% stenosis noted in  the midportion. There are luminal irregularities noted in the  distal vessel. This vessel does bifurcate into the PDA and a  posterolateral branch.  6. Left ventricular angiogram demonstrated low normal systolic  function with an ejection fraction estimated at 50%. There were no  focal wall motion abnormalities noted.  HEMODYNAMIC FINDINGS: Left ventricular pressure 158/1, end-diastolic  pressure 9, central aortic pressure 163/73.  IMPRESSION:  1. Nonobstructive coronary artery disease.  2. Low normal left ventricular systolic dysfunction.  3. Noncardiac etiology of chest pain.  RECOMMENDATIONS: I did not recommend any further cardiac workup due to  this patient's chest pain at  the current time. Further workup will be  performed by her primary cardiologist.  2D Echo 11/2011 Study Conclusions  - Left ventricle: The cavity size was normal. Systolic function was vigorous. The estimated ejection fraction was in the range of 65% to 70%. Although no diagnostic regional wall motion abnormality was identified, this possibility cannot be completely excluded on the basis of this study. Doppler parameters are consistent with abnormal left ventricular relaxation (grade 1 diastolic dysfunction). - Pulmonary arteries: Systolic pressure was mildly increased. PA peak pressure: 32mm Hg (S). Impressions:  - No cardiac source of emboli was  indentified.      Surgical History:  Past Surgical History  Procedure Laterality Date  . Cholecystectomy    . Abdominal hysterectomy    . Hip surgery    . Brain tumor excision      Meningioma; [facility] in 2008  . Shoulder surgery    . Joint replacement      rt THR     Home Meds: Prior to Admission medications   Medication Sig Start Date End Date Taking? Authorizing Provider  clopidogrel (PLAVIX) 75 MG tablet Take 75 mg by mouth daily.   Yes Historical Provider, MD  cyanocobalamin (,VITAMIN B-12,) 1000 MCG/ML injection Inject 1,000 mcg into the muscle every 30 (thirty) days.   Yes Historical Provider, MD  methimazole (TAPAZOLE) 10 MG tablet Take 10 mg by mouth daily. 01/11/12  Yes Romero Belling, MD  nitroGLYCERIN (NITROSTAT) 0.4 MG SL tablet Place 0.4 mg under the tongue every 5 (five) minutes as needed. For chest pain.   Yes Historical Provider, MD  pravastatin (PRAVACHOL) 20 MG tablet Take 20 mg by mouth daily.   Yes Historical Provider, MD  traMADol (ULTRAM) 50 MG tablet Take 50 mg by mouth every 6 (six) hours as needed for pain. For pain. 03/17/12  Yes Gordy Savers, MD  triamcinolone ointment (KENALOG) 0.1 % Apply 1 application topically 2 (two) times daily as needed. For dry skin.   Yes Historical Provider, MD    Inpatient Medications:  . clopidogrel  75 mg Oral Daily  . enoxaparin (LOVENOX) injection  40 mg Subcutaneous Daily  . methimazole  10 mg Oral Daily  .  morphine injection  2 mg Intravenous Once  . nitroGLYCERIN  0.5 inch Topical Q6H  . ondansetron (ZOFRAN) IV  4 mg Intravenous Once  . pantoprazole  40 mg Oral BID  . simvastatin  5 mg Oral q1800  . sodium chloride  3 mL Intravenous Q12H  . sodium chloride  3 mL Intravenous Q12H    Allergies:  Allergies  Allergen Reactions  . Atorvastatin Other (See Comments)    Raises liver enzymes (takes pravastatin at home)  . Influenza Vaccines   . Phenytoin Other (See Comments)    Blood clots  .  Promethazine Hcl Other (See Comments)    Severe sedation  . Sulfacetamide Sodium     REACTION: sulfa  . Sulfamethoxazole     REACTION: unspecified    History   Social History  . Marital Status: Widowed    Spouse Name: N/A    Number of Children: 4  . Years of Education: N/A   Occupational History  .     Social History Main Topics  . Smoking status: Never Smoker   . Smokeless tobacco: Never Used  . Alcohol Use: No  . Drug Use: No  . Sexually Active: No   Other Topics Concern  . Not on file   Social  History Narrative   Widowed. Lives in [place], [place] with daughter.      Family History  Problem Relation Age of Onset  . Heart attack Brother 83     Review of Systems: Patient reports generalized weakness, unchanged from baseline.  General: negative for chills, fever, night sweats  Cardiovascular: negative for chest pain, edema, orthopnea, palpitations, paroxysmal nocturnal dyspnea, shortness of breath or dyspnea on exertion. Dermatological: negative for rash Respiratory: negative for cough or wheezing Urologic: negative for hematuria Abdominal: negative for nausea, vomiting, diarrhea, bright red blood per rectum, melena, or hematemesis Neurologic: negative for visual changes, syncope, or dizziness. All other systems reviewed and are otherwise negative except as noted above.  Labs:  Recent Labs  04/29/12 2302 04/30/12 0439 04/30/12 0912  TROPONINI <0.30 <0.30 <0.30   Lab Results  Component Value Date   WBC 5.9 04/30/2012   HGB 10.6* 04/30/2012   HCT 31.2* 04/30/2012   MCV 87.4 04/30/2012   PLT 214 04/30/2012     Recent Labs Lab 04/30/12 0439  NA 141  K 4.2  CL 107  CO2 28  BUN 12  CREATININE 0.86  CALCIUM 9.2  GLUCOSE 94   Lab Results  Component Value Date   CHOL 142 03/06/2011   HDL 52 03/06/2011   LDLCALC 68 03/06/2011   TRIG 112 03/06/2011     Radiology/Studies:  Ct Head Wo Contrast 04/29/2012  *RADIOLOGY REPORT*  Clinical Data: Left arm  numbness.  CT HEAD WITHOUT CONTRAST  Technique:  Contiguous axial images were obtained from the base of the skull through the vertex without contrast.  Comparison: Head CT 10/14/2011.  Findings: Stable age related cerebral atrophy, ventriculomegaly and periventricular white matter disease.  Stable postoperative changes in the right occipital region with encephalomalacia.  No extra- axial fluid collections are identified.  No CT findings for hemispheric infarction or intracranial hemorrhage.  The brainstem and cerebellum are grossly normal and stable.  Vascular calcifications are noted.  The bony structures are intact.  Remote surgical changes noted on the right. The paranasal sinuses and mastoid air cells are clear. Globes are intact.  IMPRESSION:  1.  Stable postoperative changes in the right occipital area with encephalomalacia. 2.  Stable age related atrophy and periventricular white matter disease. 3.  No acute intracranial findings or mass lesions.   Original Report Authenticated By: Rudie Meyer, M.D.    Dg Chest Portable 1 View 04/29/2012  *RADIOLOGY REPORT*  Clinical Data: Chest pain.  PORTABLE CHEST - 1 VIEW  Comparison: 11/16/2011.  Findings: The cardiac silhouette, mediastinal and hilar contours are stable.  There is tortuosity and calcification of the thoracic aorta.  The lungs are clear.  No pleural effusion.  The bony thorax is intact.  IMPRESSION: No acute cardiopulmonary findings.   Original Report Authenticated By: Rudie Meyer, M.D.     EKG: NSR without acute changes (previously called Afib upon admission, but upon further review the patient was in NSR with artifact)  Physical Exam: Blood pressure 101/49, pulse 57, temperature 97.9 F (36.6 C), temperature source Oral, resp. rate 16, height 5\' 5"  (1.651 m), weight 116 lb 14.4 oz (53.025 kg), SpO2 97.00%. General: Well developed, well nourished, pale thin elderly female in no acute distress. Head: Normocephalic, atraumatic, sclera  non-icteric, no xanthomas, nares are without discharge.  Neck: Negative for carotid bruits. JVD not elevated. Lungs: Clear bilaterally to auscultation without wheezes, rales, or rhonchi. Breathing is unlabored. Heart: RRR with S1 S2. No murmurs, rubs, or gallops appreciated.  Abdomen: Soft, non-tender, non-distended with normoactive bowel sounds. No hepatomegaly. No rebound/guarding. No obvious abdominal masses. Msk:  Strength and tone appear normal for age. Extremities: No clubbing or cyanosis. No edema. Distal pedal pulses are 2+ and equal bilaterally. Neuro: Alert and oriented X 3. No facial asymmetry. No focal deficit. Moves all extremities spontaneously. Psych:  Responds to questions appropriately with a normal affect.   Assessment and Plan:  79 year old female with a history of CAD (s/p NSTEMI and STEMI in 2001 and 2008 respectively, medically managed, most recent cath in 2009 for chest pain revealed nonobstructive CAD with recommended medical management), Takotsubo syndrome (on 06/2006 cardiac cath; apical ballooning, EF 45%, MR), CVA with left hemiparesis, pulmonary nodule, left shoulder orthopedic problems (left cervical facet and impingement syndromes), hyperthyroidism, HTN, and HLD who came into the hospital 04/29/12 with chest pain and SOB.   1. Chest pain somewhat atypical with history of nonobstructive CAD by most recent cath 2009 - atypical chest pain that was relieved with nitro but troponins negative & ECG without acute changes or ischemic abnormalities even during pain. Continue to hold BB due to bradycardia. Continue Plavix. Continue statin. Will discuss with MD. See below for additional thoughts.   2. HTN - normotensive on admission, has been hypotensive today on nitro paste. Nitro paste d/c'd and will continue to monitor BP.   3. HLD - Continue statin.  4. Hyperthyroidism - TSH slightly elevated (recently followed as outpatient with weight loss), on methimazole. Defer to  IM.  5. Pernicious anemia - continue monthly B12 injection, defer to IM.   Signed, Briant Sites PA-Student 04/30/2012, 1:38 PM  Attending Note:   The patient was seen and examined.  Agree with assessment and plan as noted above.  Changes made to the above note as needed.  Ms. Barbe is an 53 ( WWII  [place] survivor).  She has had fairly constant pain since yesterday .  Despite this, her cardiac enzymes are negative and ECG shows no changes.  I would favor a medical approach and not an invasive approach at this point.    Cont. Imdur.  She should take NTG as needed.    It should be ok for her to go home if she is able to ambulate safely today.   Vesta Mixer, Montez Hageman., MD, [facility] 04/30/2012, 2:13 PM

## 2012-04-30 NOTE — Progress Notes (Signed)
Patient ambulated in hallway around the unit without CP/dizziness/SOB.  Tolerated ambulation well.  Will continue to monitor. [place], Mitzi Hansen

## 2012-04-30 NOTE — Progress Notes (Signed)
Utilization review completed.  

## 2012-04-30 NOTE — Progress Notes (Signed)
[facility] PROGRESS NOTE  [name] ZOX:[id number] DOB: 1922/04/08 DOA: 04/29/2012 PCP: Rogelia Boga, MD  Assessment/Plan: 1. Chest pain: relieved by NTG; cardiac enzymes negative. Had hx of CAD. Last 2-D echo in October 2013, EF 65% and grade 1 diastolic dysfunction. -will ask cardiology to consult on her and help determine further treatment. -will change PPI BID -continue plavix and statins; no B-blocker due to bradycardia  2-hyperthyroidism: continue tapazole -recent TSH and free T4 WNL  3-GERD: continue PPI; dose changed to BID  4-B12/pernicious anemia: continue monthly b12 injection.  5-HLD: continue statins  6-Diastolic heart failure: chronic and compensated. Last 2-D echo on October 2013. Close I's and O's and heart healthy diet  7-physical deconditioning and weakness: will ask PT/OT to evaluate  8-HTN: well controlled. Continue current regimen.  9-Hx of CVA: no new deficit. Continue plavix.   Code Status: Full Family Communication: No family at bedside Disposition Plan: home when medically stable  Consultants:  Cardiology Corinda Gubler)  Procedures:  See below for x-ray reports  Antibiotics:  none  HPI/Subjective: No CP or SOB at this moment. Patient NTG paste in place.  Objective: Filed Vitals:   04/29/12 2045 04/29/12 2155 04/30/12 0448 04/30/12 0604  BP: 120/58 146/62 93/41 111/50  Pulse: 57 57 57   Temp:  98 F (36.7 C) 97.9 F (36.6 C)   TempSrc:  Oral Oral   Resp: 20 16 16    Height:  5\' 5"  (1.651 m)    Weight:  53.025 kg (116 lb 14.4 oz)    SpO2: 99% 99% 97%    No intake or output data in the 24 hours ending 04/30/12 1113 Filed Weights   04/29/12 2155  Weight: 53.025 kg (116 lb 14.4 oz)    Exam:   General:  NAD, afebrile  Cardiovascular: S1 and S2, sinus on telemetry; no rubs or gallops, positive SEM  Respiratory: no wheezing, no crackles and no rhonchi  Abdomen: soft, NT, ND, positive BS  Musculoskeletal:  no joint swelling or erythema  Data Reviewed: Basic Metabolic Panel:  Recent Labs Lab 04/29/12 1759 04/29/12 2302 04/30/12 0439  NA 138  --  141  K 4.3  --  4.2  CL 101  --  107  CO2 29  --  28  GLUCOSE 120*  --  94  BUN 14  --  12  CREATININE 0.88 0.82 0.86  CALCIUM 9.5  --  9.2   CBC:  Recent Labs Lab 04/29/12 1759 04/29/12 2302 04/30/12 0439  WBC 6.0 5.9 5.9  NEUTROABS 4.0  --   --   HGB 11.2* 10.4* 10.6*  HCT 33.8* 31.1* 31.2*  MCV 89.7 89.6 87.4  PLT 244 213 214   Cardiac Enzymes:  Recent Labs Lab 04/29/12 2302 04/30/12 0439 04/30/12 0912  TROPONINI <0.30 <0.30 <0.30   BNP (last 3 results)  Recent Labs  11/17/11 0009  PROBNP 545.1*   Studies: Ct Head Wo Contrast  04/29/2012  *RADIOLOGY REPORT*  Clinical Data: Left arm numbness.  CT HEAD WITHOUT CONTRAST  Technique:  Contiguous axial images were obtained from the base of the skull through the vertex without contrast.  Comparison: Head CT 10/14/2011.  Findings: Stable age related cerebral atrophy, ventriculomegaly and periventricular white matter disease.  Stable postoperative changes in the right occipital region with encephalomalacia.  No extra- axial fluid collections are identified.  No CT findings for hemispheric infarction or intracranial hemorrhage.  The brainstem and cerebellum are grossly normal and stable.  Vascular calcifications are noted.  The bony structures are intact.  Remote surgical changes noted on the right. The paranasal sinuses and mastoid air cells are clear. Globes are intact.  IMPRESSION:  1.  Stable postoperative changes in the right occipital area with encephalomalacia. 2.  Stable age related atrophy and periventricular white matter disease. 3.  No acute intracranial findings or mass lesions.   Original Report Authenticated By: Rudie Meyer, M.D.    Dg Chest Portable 1 View  04/29/2012  *RADIOLOGY REPORT*  Clinical Data: Chest pain.  PORTABLE CHEST - 1 VIEW  Comparison: 11/16/2011.   Findings: The cardiac silhouette, mediastinal and hilar contours are stable.  There is tortuosity and calcification of the thoracic aorta.  The lungs are clear.  No pleural effusion.  The bony thorax is intact.  IMPRESSION: No acute cardiopulmonary findings.   Original Report Authenticated By: Rudie Meyer, M.D.     Scheduled Meds: . clopidogrel  75 mg Oral Daily  . enoxaparin (LOVENOX) injection  40 mg Subcutaneous Daily  . methimazole  10 mg Oral Daily  .  morphine injection  2 mg Intravenous Once  . nitroGLYCERIN  0.5 inch Topical Q6H  . ondansetron (ZOFRAN) IV  4 mg Intravenous Once  . pantoprazole  40 mg Oral BID  . sodium chloride  3 mL Intravenous Q12H  . sodium chloride  3 mL Intravenous Q12H   Continuous Infusions:   Active Problems:   HYPERTENSION   Hyperlipidemia   CVA WITH LEFT HEMIPARESIS   Takotsubo syndrome   Chest pain at rest   PAF (paroxysmal atrial fibrillation)  Time spent: >30 minutes  Aivan Fillingim  [facility] Pager [phone]. If 7PM-7AM, please contact night-coverage at [email or web address], password [facility] 04/30/2012, 11:13 AM  LOS: 1 day

## 2012-05-04 ENCOUNTER — Telehealth: Payer: Self-pay | Admitting: *Deleted

## 2012-05-05 ENCOUNTER — Telehealth: Payer: Self-pay | Admitting: *Deleted

## 2012-05-05 NOTE — Telephone Encounter (Signed)
Opened in error

## 2012-05-05 NOTE — Telephone Encounter (Signed)
Transitional care management'   Admit date :04/09/2012 Discharge date: 04/30/2012  Discharge diagnoses: Active problems:  HTN  Hyperlipidemia  cva with left hemiparesis  takotsubo syndrome  chest pain at rest  PAF (parosysmal atrial fibrillation)  hyperthyroid  Talked with patients daughter who is her care giver.  She states mom has ' ups and downs" but still has chest pain,but not as frequently.,and is relieved with NTG. Daughter states she is ambulating around the house and patient is compliant with her medication.  Patient is knowledgeable of condition and treatment.    Her post hospital visit with dr Amador Cunas is scheduled for April 5 at 10am. Patient and daughter are aware

## 2012-05-08 ENCOUNTER — Encounter: Payer: Self-pay | Admitting: Internal Medicine

## 2012-05-08 ENCOUNTER — Ambulatory Visit (INDEPENDENT_AMBULATORY_CARE_PROVIDER_SITE_OTHER): Payer: Medicare Other | Admitting: Internal Medicine

## 2012-05-08 VITALS — BP 120/60 | HR 65 | Temp 97.6°F | Resp 20 | Wt 120.0 lb

## 2012-05-08 DIAGNOSIS — D519 Vitamin B12 deficiency anemia, unspecified: Secondary | ICD-10-CM

## 2012-05-08 DIAGNOSIS — R079 Chest pain, unspecified: Secondary | ICD-10-CM

## 2012-05-08 DIAGNOSIS — D510 Vitamin B12 deficiency anemia due to intrinsic factor deficiency: Secondary | ICD-10-CM

## 2012-05-08 DIAGNOSIS — I69959 Hemiplegia and hemiparesis following unspecified cerebrovascular disease affecting unspecified side: Secondary | ICD-10-CM

## 2012-05-08 DIAGNOSIS — I2510 Atherosclerotic heart disease of native coronary artery without angina pectoris: Secondary | ICD-10-CM

## 2012-05-08 DIAGNOSIS — D518 Other vitamin B12 deficiency anemias: Secondary | ICD-10-CM

## 2012-05-08 DIAGNOSIS — I10 Essential (primary) hypertension: Secondary | ICD-10-CM

## 2012-05-08 MED ORDER — CYANOCOBALAMIN 1000 MCG/ML IJ SOLN
1000.0000 ug | Freq: Once | INTRAMUSCULAR | Status: AC
  Administered 2012-05-08: 1000 ug via INTRAMUSCULAR

## 2012-05-08 MED ORDER — ISOSORBIDE MONONITRATE ER 30 MG PO TB24: 15.0000 mg | ORAL_TABLET | Freq: Every day | ORAL | Status: DC

## 2012-05-08 NOTE — Progress Notes (Signed)
Subjective:    Patient ID: [name], female    DOB: 1922-05-19, 79 y.o.   MRN: [id number]  HPI  79 year old patient who is seen today following a hospital discharge. She was admitted for evaluation of chest pain. She does have a history of coronary artery disease. Impossible. She was seen by cardiology. Since her discharge she has had no recurrent chest pain. Medical problems include hypertension and a history of pernicious anemia. She has osteoarthritis and cerebrovascular disease. She has mild hyperthyroidism that is being treated medically. Denies any palpitations.  Hospital records reviewed  Past Medical History  Diagnosis Date  . ANEMIA, PERNICIOUS 09/10/2006  . CORONARY ARTERY DISEASE 11/07/2008    a. NSTEMI 2001 & STEMI 2008 managed medically. b. Takotsubo 06/2006. c. Cath 2009 -> med rx.  . CVA WITH LEFT HEMIPARESIS 07/22/2007    May, 2009, neurology decided aspirin and Plavix at that time.  . DERMATITIS 03/02/2007  . HYPERTENSION 07/15/2006  . MENINGIOMA 08/27/2006    Occipital meningioma, surgery, [facility], 2008 /  MRI, [place], June, 2011, no change in lesion, history believe the patient had a gamma knife treatment of a right occipital atypical meningioma  . OSTEOARTHRITIS 07/15/2006  . PULMONARY NODULE 01/14/2007    [facility], December, 2011  . Takotsubo syndrome 04/22/2008    MI, May, 2008, question takotsubo event  . THYROID NODULE 01/14/2007  . IBS (irritable bowel syndrome)   . DJD (degenerative joint disease)     L shoulder impingement  . Ejection fraction     EF 60%, echo, July, 2010, (  EF improved after tach a suitable event 2008)  . Subarachnoid hemorrhage following injury 2007    Subarachnoid hemorrhage secondary to a fall December, 2007  . Bradycardia     August, 2013  . Family history of anesthesia complication     daughter has a hard time waking up  . H/O hiatal hernia   . Abnormal CT of the chest     Stable patchy/nodular and ground-glass opacities in the  right upper lobe, While grossly unchanged, low grade adenocarcinoma remains possible.    History   Social History  . Marital Status: Widowed    Spouse Name: N/A    Number of Children: 4  . Years of Education: N/A   Occupational History  .     Social History Main Topics  . Smoking status: Never Smoker   . Smokeless tobacco: Never Used  . Alcohol Use: No  . Drug Use: No  . Sexually Active: No   Other Topics Concern  . Not on file   Social History Narrative   Widowed. Lives in [place], [place] with daughter.     Past Surgical History  Procedure Laterality Date  . Cholecystectomy    . Abdominal hysterectomy    . Hip surgery    . Brain tumor excision      Meningioma; treated at [facility] in 2008  . Shoulder surgery    . Joint replacement      rt THR    Family History  Problem Relation Age of Onset  . Heart attack Brother 84    Allergies  Allergen Reactions  . Atorvastatin Other (See Comments)    Raises liver enzymes (takes pravastatin at home)  . Influenza Vaccines   . Phenytoin Other (See Comments)    Blood clots  . Promethazine Hcl Other (See Comments)    Severe sedation  . Sulfacetamide Sodium     REACTION: sulfa  . Sulfamethoxazole  REACTION: unspecified    Current Outpatient Prescriptions on File Prior to Visit  Medication Sig Dispense Refill  . clopidogrel (PLAVIX) 75 MG tablet Take 75 mg by mouth daily.      . cyanocobalamin (,VITAMIN B-12,) 1000 MCG/ML injection Inject 1,000 mcg into the muscle every 30 (thirty) days.      . isosorbide mononitrate (IMDUR) 30 MG 24 hr tablet Take 0.5 tablets (15 mg total) by mouth daily.  30 tablet  1  . methimazole (TAPAZOLE) 10 MG tablet Take 10 mg by mouth daily.      . nitroGLYCERIN (NITROSTAT) 0.4 MG SL tablet Place 1 tablet (0.4 mg total) under the tongue every 5 (five) minutes as needed. For chest pain.  15 tablet  0  . pantoprazole (PROTONIX) 40 MG tablet Take 1 tablet (40 mg total) by mouth 2 (two) times  daily.  60 tablet  1  . pravastatin (PRAVACHOL) 20 MG tablet Take 20 mg by mouth daily.      . traMADol (ULTRAM) 50 MG tablet Take 50 mg by mouth every 6 (six) hours as needed for pain. For pain.      Marland Kitchen triamcinolone ointment (KENALOG) 0.1 % Apply 1 application topically 2 (two) times daily as needed. For dry skin.       No current facility-administered medications on file prior to visit.    BP 120/60  Pulse 65  Temp(Src) 97.6 F (36.4 C) (Oral)  Resp 20  Wt 120 lb (54.432 kg)  BMI 19.97 kg/m2  SpO2 97%       Review of Systems  Constitutional: Negative.   HENT: Negative for hearing loss, congestion, sore throat, rhinorrhea, dental problem, sinus pressure and tinnitus.   Eyes: Negative for pain, discharge and visual disturbance.  Respiratory: Negative for cough and shortness of breath.   Cardiovascular: Positive for chest pain. Negative for palpitations and leg swelling.  Gastrointestinal: Negative for nausea, vomiting, abdominal pain, diarrhea, constipation, blood in stool and abdominal distention.  Genitourinary: Negative for dysuria, urgency, frequency, hematuria, flank pain, vaginal bleeding, vaginal discharge, difficulty urinating, vaginal pain and pelvic pain.  Musculoskeletal: Negative for joint swelling, arthralgias and gait problem.  Skin: Negative for rash.  Neurological: Negative for dizziness, syncope, speech difficulty, weakness, numbness and headaches.  Hematological: Negative for adenopathy.  Psychiatric/Behavioral: Negative for behavioral problems, dysphoric mood and agitation. The patient is not nervous/anxious.        Objective:   Physical Exam  Constitutional: She is oriented to person, place, and time. She appears well-developed and well-nourished.  HENT:  Head: Normocephalic.  Right Ear: External ear normal.  Left Ear: External ear normal.  Mouth/Throat: Oropharynx is clear and moist.  Eyes: Conjunctivae and EOM are normal. Pupils are equal, round,  and reactive to light.  Neck: Normal range of motion. Neck supple. No thyromegaly present.  Cardiovascular: Normal rate, regular rhythm, normal heart sounds and intact distal pulses.   Pulmonary/Chest: Effort normal and breath sounds normal. No respiratory distress. She has no wheezes. She has no rales.  Abdominal: Soft. Bowel sounds are normal. She exhibits no mass. There is no tenderness.  Musculoskeletal: Normal range of motion.  Lymphadenopathy:    She has no cervical adenopathy.  Neurological: She is alert and oriented to person, place, and time.  Skin: Skin is warm and dry. No rash noted.  Psychiatric: She has a normal mood and affect. Her behavior is normal.          Assessment & Plan:   Atypical  chest pain. Clinically stable Hypertension well controlled Coronary artery disease B12 deficiency History of cerebrovascular disease Hyperthyroidism. Continue Tapazole  Recheck 3 months or as needed

## 2012-05-08 NOTE — Patient Instructions (Signed)
Limit your sodium (Salt) intake  Avoids foods high in acid such as tomatoes citrus juices, and spicy foods.  Avoid eating within two hours of lying down or before exercising.  Do not overheat.  Try smaller more frequent meals.  If symptoms persist, elevate the head of her bed 12 inches while sleeping.  Return in 3 months for follow-up

## 2012-05-19 ENCOUNTER — Ambulatory Visit: Payer: Medicare Other | Admitting: Internal Medicine

## 2012-05-29 DIAGNOSIS — R079 Chest pain, unspecified: Secondary | ICD-10-CM

## 2012-05-29 DIAGNOSIS — I10 Essential (primary) hypertension: Secondary | ICD-10-CM

## 2012-05-29 DIAGNOSIS — I2510 Atherosclerotic heart disease of native coronary artery without angina pectoris: Secondary | ICD-10-CM

## 2012-05-29 DIAGNOSIS — I69959 Hemiplegia and hemiparesis following unspecified cerebrovascular disease affecting unspecified side: Secondary | ICD-10-CM

## 2012-06-01 ENCOUNTER — Ambulatory Visit (INDEPENDENT_AMBULATORY_CARE_PROVIDER_SITE_OTHER): Payer: Medicare Other | Admitting: Internal Medicine

## 2012-06-01 DIAGNOSIS — D518 Other vitamin B12 deficiency anemias: Secondary | ICD-10-CM

## 2012-06-01 MED ORDER — CYANOCOBALAMIN 1000 MCG/ML IJ SOLN
1000.0000 ug | Freq: Once | INTRAMUSCULAR | Status: AC
  Administered 2012-06-01: 1000 ug via INTRAMUSCULAR

## 2012-06-01 NOTE — Progress Notes (Signed)
  Subjective:    Patient ID: [name], female    DOB: 1922/06/13, 79 y.o.   MRN: [id number]  HPI    Review of Systems     Objective:   Physical Exam        Assessment & Plan:

## 2012-06-22 ENCOUNTER — Encounter: Payer: Self-pay | Admitting: Internal Medicine

## 2012-06-22 ENCOUNTER — Ambulatory Visit (INDEPENDENT_AMBULATORY_CARE_PROVIDER_SITE_OTHER): Payer: Medicare Other | Admitting: Internal Medicine

## 2012-06-22 VITALS — BP 130/60 | HR 72 | Temp 97.4°F | Resp 20 | Wt 120.0 lb

## 2012-06-22 DIAGNOSIS — I10 Essential (primary) hypertension: Secondary | ICD-10-CM

## 2012-06-22 DIAGNOSIS — R0789 Other chest pain: Secondary | ICD-10-CM

## 2012-06-22 DIAGNOSIS — E0590 Thyrotoxicosis, unspecified without thyrotoxic crisis or storm: Secondary | ICD-10-CM

## 2012-06-22 DIAGNOSIS — I2510 Atherosclerotic heart disease of native coronary artery without angina pectoris: Secondary | ICD-10-CM

## 2012-06-22 DIAGNOSIS — D510 Vitamin B12 deficiency anemia due to intrinsic factor deficiency: Secondary | ICD-10-CM

## 2012-06-22 NOTE — Progress Notes (Signed)
Subjective:    Patient ID: [name], female    DOB: 06-13-1922, 79 y.o.   MRN: [id number]  HPI  79 year old patient who is seen today for an acute visit due 2 blood pressure concerns. She does monitor home blood pressure readings and last night blood pressure systolic was elevated to 171. She has a history of coronary artery disease treated hypertension and hyperthyroidism. She complains of some left lateral chest wall pain.  Past Medical History  Diagnosis Date  . ANEMIA, PERNICIOUS 09/10/2006  . CORONARY ARTERY DISEASE 11/07/2008    a. NSTEMI 2001 & STEMI 2008 managed medically. b. Takotsubo 06/2006. c. Cath 2009 -> med rx.  . CVA WITH LEFT HEMIPARESIS 07/22/2007    May, 2009, neurology decided aspirin and Plavix at that time.  . DERMATITIS 03/02/2007  . HYPERTENSION 07/15/2006  . MENINGIOMA 08/27/2006    Occipital meningioma, surgery, [facility], 2008 /  MRI, [place], June, 2011, no change in lesion, history believe the patient had a gamma knife treatment of a right occipital atypical meningioma  . OSTEOARTHRITIS 07/15/2006  . PULMONARY NODULE 01/14/2007    [facility], December, 2011  . Takotsubo syndrome 04/22/2008    MI, May, 2008, question takotsubo event  . THYROID NODULE 01/14/2007  . IBS (irritable bowel syndrome)   . DJD (degenerative joint disease)     L shoulder impingement  . Ejection fraction     EF 60%, echo, July, 2010, (  EF improved after tach a suitable event 2008)  . Subarachnoid hemorrhage following injury 2007    Subarachnoid hemorrhage secondary to a fall December, 2007  . Bradycardia     August, 2013  . Family history of anesthesia complication     daughter has a hard time waking up  . H/O hiatal hernia   . Abnormal CT of the chest     Stable patchy/nodular and ground-glass opacities in the right upper lobe, While grossly unchanged, low grade adenocarcinoma remains possible.    History   Social History  . Marital Status: Widowed    Spouse Name: N/A    Number  of Children: 4  . Years of Education: N/A   Occupational History  .     Social History Main Topics  . Smoking status: Never Smoker   . Smokeless tobacco: Never Used  . Alcohol Use: No  . Drug Use: No  . Sexually Active: No   Other Topics Concern  . Not on file   Social History Narrative   Widowed. Lives in [place], [place] with daughter.     Past Surgical History  Procedure Laterality Date  . Cholecystectomy    . Abdominal hysterectomy    . Hip surgery    . Brain tumor excision      Meningioma; treated at [facility] in 2008  . Shoulder surgery    . Joint replacement      rt THR    Family History  Problem Relation Age of Onset  . Heart attack Brother 84    Allergies  Allergen Reactions  . Atorvastatin Other (See Comments)    Raises liver enzymes (takes pravastatin at home)  . Influenza Vaccines   . Phenytoin Other (See Comments)    Blood clots  . Promethazine Hcl Other (See Comments)    Severe sedation  . Sulfacetamide Sodium     REACTION: sulfa  . Sulfamethoxazole     REACTION: unspecified    Current Outpatient Prescriptions on File Prior to Visit  Medication Sig Dispense Refill  .  clopidogrel (PLAVIX) 75 MG tablet Take 75 mg by mouth daily.      . cyanocobalamin (,VITAMIN B-12,) 1000 MCG/ML injection Inject 1,000 mcg into the muscle every 30 (thirty) days.      . isosorbide mononitrate (IMDUR) 30 MG 24 hr tablet Take 0.5 tablets (15 mg total) by mouth daily.  90 tablet  5  . methimazole (TAPAZOLE) 10 MG tablet Take 10 mg by mouth daily.      . nitroGLYCERIN (NITROSTAT) 0.4 MG SL tablet Place 1 tablet (0.4 mg total) under the tongue every 5 (five) minutes as needed. For chest pain.  15 tablet  0  . pravastatin (PRAVACHOL) 20 MG tablet Take 20 mg by mouth daily.      . traMADol (ULTRAM) 50 MG tablet Take 50 mg by mouth every 6 (six) hours as needed for pain. For pain.      Marland Kitchen triamcinolone ointment (KENALOG) 0.1 % Apply 1 application topically 2 (two) times daily  as needed. For dry skin.       No current facility-administered medications on file prior to visit.    BP 130/60  Pulse 72  Temp(Src) 97.4 F (36.3 C) (Oral)  Resp 20  Wt 120 lb (54.432 kg)  BMI 19.97 kg/m2  SpO2 97%       Review of Systems  Constitutional: Negative.   HENT: Negative for hearing loss, congestion, sore throat, rhinorrhea, dental problem, sinus pressure and tinnitus.   Eyes: Negative for pain, discharge and visual disturbance.  Respiratory: Negative for cough and shortness of breath.   Cardiovascular: Positive for chest pain. Negative for palpitations and leg swelling.  Gastrointestinal: Negative for nausea, vomiting, abdominal pain, diarrhea, constipation, blood in stool and abdominal distention.  Genitourinary: Negative for dysuria, urgency, frequency, hematuria, flank pain, vaginal bleeding, vaginal discharge, difficulty urinating, vaginal pain and pelvic pain.  Musculoskeletal: Negative for joint swelling, arthralgias and gait problem.  Skin: Negative for rash.  Neurological: Negative for dizziness, syncope, speech difficulty, weakness, numbness and headaches.  Hematological: Negative for adenopathy.  Psychiatric/Behavioral: Negative for behavioral problems, dysphoric mood and agitation. The patient is not nervous/anxious.        Objective:   Physical Exam  Constitutional: She is oriented to person, place, and time. She appears well-developed and well-nourished.  Repeat blood pressure 130/60 unchanged  HENT:  Head: Normocephalic.  Right Ear: External ear normal.  Left Ear: External ear normal.  Mouth/Throat: Oropharynx is clear and moist.  Eyes: Conjunctivae and EOM are normal. Pupils are equal, round, and reactive to light.  Neck: Normal range of motion. Neck supple. No thyromegaly present.  Cardiovascular: Normal rate, regular rhythm, normal heart sounds and intact distal pulses.   Pulmonary/Chest: Effort normal and breath sounds normal.   Abdominal: Soft. Bowel sounds are normal. She exhibits no mass. There is no tenderness.  Musculoskeletal: Normal range of motion.  Lymphadenopathy:    She has no cervical adenopathy.  Neurological: She is alert and oriented to person, place, and time.  Skin: Skin is warm and dry. No rash noted.  Psychiatric: She has a normal mood and affect. Her behavior is normal.          Assessment & Plan:   Hypertension. Appears to be well controlled. Patient had only a single elevated blood pressure reading yesterday. She does monitor blood pressures carefully Hyperthyroidism Chest wall pain   Followup endocrinology  Recheck here in 5 months

## 2012-06-22 NOTE — Patient Instructions (Signed)
Please check your blood pressure on a regular basis.  If it is consistently greater than 150/90, please make an office appointment.  

## 2012-07-06 ENCOUNTER — Telehealth: Payer: Self-pay | Admitting: Internal Medicine

## 2012-07-06 NOTE — Telephone Encounter (Signed)
Pt daughter is requesting a vit b12 med call into [facility]. Pt daugh is aware MD out of office this week

## 2012-07-07 NOTE — Telephone Encounter (Signed)
Spoke to pt asked her what medication is she wanting? Pt stated she is wanting B12.Told pt she is due for an injection just needs to make an appt I have one shot left I will save it for her. Told her to make an appt this week for injection. Pt verbalized understanding and stated will have daughter call back to make appt. Told her okay.

## 2012-07-08 ENCOUNTER — Ambulatory Visit (INDEPENDENT_AMBULATORY_CARE_PROVIDER_SITE_OTHER): Payer: Medicare Other | Admitting: *Deleted

## 2012-07-08 DIAGNOSIS — D510 Vitamin B12 deficiency anemia due to intrinsic factor deficiency: Secondary | ICD-10-CM

## 2012-07-08 DIAGNOSIS — D519 Vitamin B12 deficiency anemia, unspecified: Secondary | ICD-10-CM

## 2012-07-08 DIAGNOSIS — D518 Other vitamin B12 deficiency anemias: Secondary | ICD-10-CM

## 2012-07-08 MED ORDER — "NEEDLE (DISP) 25G X 5/8"" MISC": 1.0000 | Status: DC

## 2012-07-08 MED ORDER — CYANOCOBALAMIN 1000 MCG/ML IJ SOLN: 1000.0000 ug | INTRAMUSCULAR | Status: DC

## 2012-07-08 MED ORDER — SYRINGE (DISPOSABLE) 3 ML MISC: 1.0000 | Status: DC

## 2012-07-08 MED ORDER — CYANOCOBALAMIN 1000 MCG/ML IJ SOLN
1000.0000 ug | Freq: Once | INTRAMUSCULAR | Status: AC
  Administered 2012-07-08: 1000 ug via INTRAMUSCULAR

## 2012-07-13 ENCOUNTER — Encounter: Payer: Self-pay | Admitting: Internal Medicine

## 2012-07-13 ENCOUNTER — Ambulatory Visit (INDEPENDENT_AMBULATORY_CARE_PROVIDER_SITE_OTHER): Payer: Medicare Other | Admitting: Internal Medicine

## 2012-07-13 VITALS — BP 120/70 | HR 78 | Temp 97.6°F | Resp 20 | Wt 119.0 lb

## 2012-07-13 DIAGNOSIS — I10 Essential (primary) hypertension: Secondary | ICD-10-CM

## 2012-07-13 DIAGNOSIS — D510 Vitamin B12 deficiency anemia due to intrinsic factor deficiency: Secondary | ICD-10-CM

## 2012-07-13 DIAGNOSIS — R112 Nausea with vomiting, unspecified: Secondary | ICD-10-CM

## 2012-07-13 LAB — CBC WITH DIFFERENTIAL/PLATELET
Basophils Relative: 0.7 % (ref 0.0–3.0)
Eosinophils Relative: 2 % (ref 0.0–5.0)
HCT: 34.9 % — ABNORMAL LOW (ref 36.0–46.0)
Hemoglobin: 11.6 g/dL — ABNORMAL LOW (ref 12.0–15.0)
Lymphs Abs: 1.4 10*3/uL (ref 0.7–4.0)
MCV: 91 fl (ref 78.0–100.0)
Monocytes Absolute: 0.8 10*3/uL (ref 0.1–1.0)
Neutro Abs: 4.3 10*3/uL (ref 1.4–7.7)
Platelets: 213 10*3/uL (ref 150.0–400.0)
WBC: 6.6 10*3/uL (ref 4.5–10.5)

## 2012-07-13 NOTE — Patient Instructions (Signed)
Drink as much fluid as you  can tolerate over the next few days and slowly advance   your  diet

## 2012-07-13 NOTE — Progress Notes (Signed)
Subjective:    Patient ID: [name], female    DOB: 10/26/1922, 79 y.o.   MRN: [id number]  HPI  79 year old patient who presents today with a chief complaint of nausea and vomiting over the weekend this occurred 2 nights ago but over the past 24 hours she has improved. The present time she complains of poor appetite and weakness. No diarrhea. Her bowel habits have been fairly normal. She lives with her daughter has had some mild diarrhea. No fever.  Past Medical History  Diagnosis Date  . ANEMIA, PERNICIOUS 09/10/2006  . CORONARY ARTERY DISEASE 11/07/2008    a. NSTEMI 2001 & STEMI 2008 managed medically. b. Takotsubo 06/2006. c. Cath 2009 -> med rx.  . CVA WITH LEFT HEMIPARESIS 07/22/2007    May, 2009, neurology decided aspirin and Plavix at that time.  . DERMATITIS 03/02/2007  . HYPERTENSION 07/15/2006  . MENINGIOMA 08/27/2006    Occipital meningioma, surgery, [facility], 2008 /  MRI, [place], June, 2011, no change in lesion, history believe the patient had a gamma knife treatment of a right occipital atypical meningioma  . OSTEOARTHRITIS 07/15/2006  . PULMONARY NODULE 01/14/2007    [facility], December, 2011  . Takotsubo syndrome 04/22/2008    MI, May, 2008, question takotsubo event  . THYROID NODULE 01/14/2007  . IBS (irritable bowel syndrome)   . DJD (degenerative joint disease)     L shoulder impingement  . Ejection fraction     EF 60%, echo, July, 2010, (  EF improved after tach a suitable event 2008)  . Subarachnoid hemorrhage following injury 2007    Subarachnoid hemorrhage secondary to a fall December, 2007  . Bradycardia     August, 2013  . Family history of anesthesia complication     daughter has a hard time waking up  . H/O hiatal hernia   . Abnormal CT of the chest     Stable patchy/nodular and ground-glass opacities in the right upper lobe, While grossly unchanged, low grade adenocarcinoma remains possible.    History   Social History  . Marital Status: Widowed   Spouse Name: N/A    Number of Children: 4  . Years of Education: N/A   Occupational History  .     Social History Main Topics  . Smoking status: Never Smoker   . Smokeless tobacco: Never Used  . Alcohol Use: No  . Drug Use: No  . Sexually Active: No   Other Topics Concern  . Not on file   Social History Narrative   Widowed. Lives in [place], [place] with daughter.     Past Surgical History  Procedure Laterality Date  . Cholecystectomy    . Abdominal hysterectomy    . Hip surgery    . Brain tumor excision      Meningioma; treated at [facility] in 2008  . Shoulder surgery    . Joint replacement      rt THR    Family History  Problem Relation Age of Onset  . Heart attack Brother 84    Allergies  Allergen Reactions  . Atorvastatin Other (See Comments)    Raises liver enzymes (takes pravastatin at home)  . Influenza Vaccines   . Phenytoin Other (See Comments)    Blood clots  . Promethazine Hcl Other (See Comments)    Severe sedation  . Sulfacetamide Sodium     REACTION: sulfa  . Sulfamethoxazole     REACTION: unspecified    Current Outpatient Prescriptions on File Prior to Visit  Medication Sig Dispense Refill  . clopidogrel (PLAVIX) 75 MG tablet Take 75 mg by mouth daily.      . cyanocobalamin (,VITAMIN B-12,) 1000 MCG/ML injection Inject 1 mL (1,000 mcg total) into the muscle every 30 (thirty) days.  3 mL  1  . isosorbide mononitrate (IMDUR) 30 MG 24 hr tablet Take 0.5 tablets (15 mg total) by mouth daily.  90 tablet  5  . methimazole (TAPAZOLE) 10 MG tablet Take 10 mg by mouth daily.      Marland Kitchen NEEDLE, DISP, 25 G (BD DISP NEEDLES) 25G X 5/8" MISC 1 each by Does not apply route every 30 (thirty) days.  50 each  1  . nitroGLYCERIN (NITROSTAT) 0.4 MG SL tablet Place 1 tablet (0.4 mg total) under the tongue every 5 (five) minutes as needed. For chest pain.  15 tablet  0  . pravastatin (PRAVACHOL) 20 MG tablet Take 20 mg by mouth daily.      . Syringe, Disposable, 3 ML  MISC 1 each by Does not apply route every 30 (thirty) days.  25 each  1  . traMADol (ULTRAM) 50 MG tablet Take 50 mg by mouth every 6 (six) hours as needed for pain. For pain.      Marland Kitchen triamcinolone ointment (KENALOG) 0.1 % Apply 1 application topically 2 (two) times daily as needed. For dry skin.       No current facility-administered medications on file prior to visit.    BP 120/70  Pulse 78  Temp(Src) 97.6 F (36.4 C) (Oral)  Resp 20  Wt 119 lb (53.978 kg)  BMI 19.8 kg/m2  SpO2 97%       Review of Systems  Constitutional: Negative.   HENT: Negative for hearing loss, congestion, sore throat, rhinorrhea, dental problem, sinus pressure and tinnitus.   Eyes: Negative for pain, discharge and visual disturbance.  Respiratory: Negative for cough and shortness of breath.   Cardiovascular: Negative for chest pain, palpitations and leg swelling.  Gastrointestinal: Positive for nausea and vomiting. Negative for abdominal pain, diarrhea, constipation, blood in stool and abdominal distention.  Genitourinary: Negative for dysuria, urgency, frequency, hematuria, flank pain, vaginal bleeding, vaginal discharge, difficulty urinating, vaginal pain and pelvic pain.  Musculoskeletal: Negative for joint swelling, arthralgias and gait problem.  Skin: Negative for rash.  Neurological: Negative for dizziness, syncope, speech difficulty, weakness, numbness and headaches.  Hematological: Negative for adenopathy.  Psychiatric/Behavioral: Negative for behavioral problems, dysphoric mood and agitation. The patient is not nervous/anxious.        Objective:   Physical Exam  Constitutional: She is oriented to person, place, and time. She appears well-developed and well-nourished.  HENT:  Head: Normocephalic.  Right Ear: External ear normal.  Left Ear: External ear normal.  Mouth/Throat: Oropharynx is clear and moist.  Eyes: Conjunctivae and EOM are normal. Pupils are equal, round, and reactive to  light.  Neck: Normal range of motion. Neck supple. No thyromegaly present.  Cardiovascular: Normal rate, regular rhythm, normal heart sounds and intact distal pulses.   Pulmonary/Chest: Effort normal and breath sounds normal.  Abdominal: Soft. Bowel sounds are normal. She exhibits no mass. There is tenderness.  Mild epigastric tenderness  Musculoskeletal: Normal range of motion.  Lymphadenopathy:    She has no cervical adenopathy.  Neurological: She is alert and oriented to person, place, and time.  Skin: Skin is warm and dry. No rash noted.  Psychiatric: She has a normal mood and affect. Her behavior is normal.  Assessment & Plan:   Nausea and vomiting. Largely resolved. Probably viral syndrome Hypertension stable History pernicious anemia. Patient has a history of anemia and has had a difficult time obtaining B12 supplements do to the availability issues. Will check a CBC

## 2012-07-23 ENCOUNTER — Ambulatory Visit (INDEPENDENT_AMBULATORY_CARE_PROVIDER_SITE_OTHER): Payer: Medicare Other | Admitting: Endocrinology

## 2012-07-23 ENCOUNTER — Encounter: Payer: Self-pay | Admitting: Endocrinology

## 2012-07-23 VITALS — BP 114/74 | HR 78 | Ht 63.0 in | Wt 117.0 lb

## 2012-07-23 DIAGNOSIS — E0590 Thyrotoxicosis, unspecified without thyrotoxic crisis or storm: Secondary | ICD-10-CM

## 2012-07-23 LAB — TSH: TSH: 2.64 u[IU]/mL (ref 0.35–5.50)

## 2012-07-23 NOTE — Patient Instructions (Addendum)
blood tests are being requested for you today.  We'll contact you with results.   Please come back for a follow-up appointment in 3 months. if ever you have fever while taking methimazole, stop it and call us, because of the risk of a rare side-effect.   

## 2012-07-23 NOTE — Progress Notes (Signed)
Subjective:    Patient ID: [name], female    DOB: [birth year], 79 y.o.   MRN: [id number]  HPI Pt was noted in the hospital in sept, 2013, to have hyperthyroidism.  She was started on tapazole.  Since on the tapazole, she feels no different.  Her main symptom continues to be generalized weakness.   Past Medical History  Diagnosis Date  . ANEMIA, PERNICIOUS 09/10/2006  . CORONARY ARTERY DISEASE 11/07/2008    a. NSTEMI 2001 & STEMI 2008 managed medically. b. Takotsubo 06/2006. c. Cath 2009 -> med rx.  . CVA WITH LEFT HEMIPARESIS 07/22/2007    May, 2009, neurology decided aspirin and Plavix at that time.  . DERMATITIS 03/02/2007  . HYPERTENSION 07/15/2006  . MENINGIOMA 08/27/2006    Occipital meningioma, surgery, [facility], 2008 /  MRI, [facility], June, 2011, no change in lesion, history believe the patient had a gamma knife treatment of a right occipital atypical meningioma  . OSTEOARTHRITIS 07/15/2006  . PULMONARY NODULE 01/14/2007    [facility], December, 2011  . Takotsubo syndrome 04/22/2008    MI, May, 2008, question takotsubo event  . THYROID NODULE 01/14/2007  . IBS (irritable bowel syndrome)   . DJD (degenerative joint disease)     L shoulder impingement  . Ejection fraction     EF 60%, echo, July, 2010, (  EF improved after tach a suitable event 2008)  . Subarachnoid hemorrhage following injury 2007    Subarachnoid hemorrhage secondary to a fall December, 2007  . Bradycardia     August, 2013  . Family history of anesthesia complication     daughter has a hard time waking up  . H/O hiatal hernia   . Abnormal CT of the chest     Stable patchy/nodular and ground-glass opacities in the right upper lobe, While grossly unchanged, low grade adenocarcinoma remains possible.    Past Surgical History  Procedure Laterality Date  . Cholecystectomy    . Abdominal hysterectomy    . Hip surgery    . Brain tumor excision      Meningioma; treated at [facility] in 2008  . Shoulder surgery    .  Joint replacement      rt THR    History   Social History  . Marital Status: Widowed    Spouse Name: N/A    Number of Children: 4  . Years of Education: N/A   Occupational History  .     Social History Main Topics  . Smoking status: Never Smoker   . Smokeless tobacco: Never Used  . Alcohol Use: No  . Drug Use: No  . Sexually Active: No   Other Topics Concern  . Not on file   Social History Narrative   Widowed. Lives in [place], [place] with daughter.     Current Outpatient Prescriptions on File Prior to Visit  Medication Sig Dispense Refill  . clopidogrel (PLAVIX) 75 MG tablet Take 75 mg by mouth daily.      . cyanocobalamin (,VITAMIN B-12,) 1000 MCG/ML injection Inject 1 mL (1,000 mcg total) into the muscle every 30 (thirty) days.  3 mL  1  . isosorbide mononitrate (IMDUR) 30 MG 24 hr tablet Take 0.5 tablets (15 mg total) by mouth daily.  90 tablet  5  . methimazole (TAPAZOLE) 10 MG tablet Take 10 mg by mouth daily.      Marland Kitchen NEEDLE, DISP, 25 G (BD DISP NEEDLES) 25G X 5/8" MISC 1 each by Does not apply route every  30 (thirty) days.  50 each  1  . nitroGLYCERIN (NITROSTAT) 0.4 MG SL tablet Place 1 tablet (0.4 mg total) under the tongue every 5 (five) minutes as needed. For chest pain.  15 tablet  0  . pravastatin (PRAVACHOL) 20 MG tablet Take 20 mg by mouth daily.      . Syringe, Disposable, 3 ML MISC 1 each by Does not apply route every 30 (thirty) days.  25 each  1  . traMADol (ULTRAM) 50 MG tablet Take 50 mg by mouth every 6 (six) hours as needed for pain. For pain.      Marland Kitchen triamcinolone ointment (KENALOG) 0.1 % Apply 1 application topically 2 (two) times daily as needed. For dry skin.       No current facility-administered medications on file prior to visit.    Allergies  Allergen Reactions  . Atorvastatin Other (See Comments)    Raises liver enzymes (takes pravastatin at home)  . Influenza Vaccines   . Phenytoin Other (See Comments)    Blood clots  . Promethazine  Hcl Other (See Comments)    Severe sedation  . Sulfacetamide Sodium     REACTION: sulfa  . Sulfamethoxazole     REACTION: unspecified    Family History  Problem Relation Age of Onset  . Heart attack Brother 84    BP 114/74  Pulse 78  Ht 5\' 3"  (1.6 m)  Wt 117 lb (53.071 kg)  BMI 20.73 kg/m2  SpO2 97%  Review of Systems Denies fever    Objective:   Physical Exam VITAL SIGNS:  See vs page GENERAL: no distress NECK: There is no palpable thyroid enlargement.  No thyroid nodule is palpable.  No palpable lymphadenopathy at the anterior neck.   Lab Results  Component Value Date   TSH 2.64 07/23/2012      Assessment & Plan:  Hyperthyroidism, well-controlled

## 2012-08-14 ENCOUNTER — Ambulatory Visit: Payer: Medicare Other | Admitting: Internal Medicine

## 2012-08-29 ENCOUNTER — Other Ambulatory Visit: Payer: Self-pay | Admitting: Endocrinology

## 2012-09-08 ENCOUNTER — Ambulatory Visit: Payer: Medicare Other | Admitting: Internal Medicine

## 2012-09-09 ENCOUNTER — Other Ambulatory Visit: Payer: Self-pay

## 2012-09-14 ENCOUNTER — Ambulatory Visit: Payer: Medicare Other | Admitting: Internal Medicine

## 2012-09-17 ENCOUNTER — Ambulatory Visit (INDEPENDENT_AMBULATORY_CARE_PROVIDER_SITE_OTHER): Payer: Medicare Other | Admitting: Internal Medicine

## 2012-09-17 ENCOUNTER — Encounter: Payer: Self-pay | Admitting: Internal Medicine

## 2012-09-17 VITALS — BP 110/64 | HR 64 | Temp 97.7°F | Wt 119.0 lb

## 2012-09-17 DIAGNOSIS — I2510 Atherosclerotic heart disease of native coronary artery without angina pectoris: Secondary | ICD-10-CM

## 2012-09-17 DIAGNOSIS — I69959 Hemiplegia and hemiparesis following unspecified cerebrovascular disease affecting unspecified side: Secondary | ICD-10-CM

## 2012-09-17 DIAGNOSIS — D510 Vitamin B12 deficiency anemia due to intrinsic factor deficiency: Secondary | ICD-10-CM

## 2012-09-17 DIAGNOSIS — I10 Essential (primary) hypertension: Secondary | ICD-10-CM

## 2012-09-17 DIAGNOSIS — M1990 Unspecified osteoarthritis, unspecified site: Secondary | ICD-10-CM

## 2012-09-17 DIAGNOSIS — N390 Urinary tract infection, site not specified: Secondary | ICD-10-CM

## 2012-09-17 DIAGNOSIS — E0590 Thyrotoxicosis, unspecified without thyrotoxic crisis or storm: Secondary | ICD-10-CM

## 2012-09-17 LAB — COMPREHENSIVE METABOLIC PANEL
ALT: 10 U/L (ref 0–35)
AST: 18 U/L (ref 0–37)
Albumin: 3.7 g/dL (ref 3.5–5.2)
Calcium: 9 mg/dL (ref 8.4–10.5)
Chloride: 101 mEq/L (ref 96–112)
Creatinine, Ser: 0.9 mg/dL (ref 0.4–1.2)
Potassium: 3.9 mEq/L (ref 3.5–5.1)
Sodium: 137 mEq/L (ref 135–145)

## 2012-09-17 LAB — CBC WITH DIFFERENTIAL/PLATELET
Basophils Absolute: 0.1 10*3/uL (ref 0.0–0.1)
Eosinophils Absolute: 0.2 10*3/uL (ref 0.0–0.7)
Hemoglobin: 11.8 g/dL — ABNORMAL LOW (ref 12.0–15.0)
Lymphocytes Relative: 12.9 % (ref 12.0–46.0)
Lymphs Abs: 0.9 10*3/uL (ref 0.7–4.0)
MCHC: 32.8 g/dL (ref 30.0–36.0)
Neutro Abs: 5 10*3/uL (ref 1.4–7.7)
RDW: 15.2 % — ABNORMAL HIGH (ref 11.5–14.6)

## 2012-09-17 LAB — POCT URINALYSIS DIPSTICK
Blood, UA: NEGATIVE
Protein, UA: NEGATIVE
Spec Grav, UA: 1.015
Urobilinogen, UA: 0.2

## 2012-09-17 LAB — SEDIMENTATION RATE: Sed Rate: 13 mm/hr (ref 0–22)

## 2012-09-17 LAB — TSH: TSH: 5.81 u[IU]/mL — ABNORMAL HIGH (ref 0.35–5.50)

## 2012-09-17 NOTE — Progress Notes (Signed)
Subjective:    Patient ID: [name], female    DOB: 1922/04/07, 79 y.o.   MRN: [id number]  HPI  79 year old patient who is seen today for followup. In the past week she has felt poorly with increasing fatigue. Complains of neck back and left shoulder pain. She also states that she has had a difficult time voiding. Denies any dysuria. She generally feels unwell. She does have a history of hyperthyroidism which has been controlled on Tapazole. She has a history of cardiac and cerebrovascular disease. Treated medical problems include hypertension osteoarthritis and also a history of pernicious anemia  Past Medical History  Diagnosis Date  . ANEMIA, PERNICIOUS 09/10/2006  . CORONARY ARTERY DISEASE 11/07/2008    a. NSTEMI 2001 & STEMI 2008 managed medically. b. Takotsubo 06/2006. c. Cath 2009 -> med rx.  . CVA WITH LEFT HEMIPARESIS 07/22/2007    May, 2009, neurology decided aspirin and Plavix at that time.  . DERMATITIS 03/02/2007  . HYPERTENSION 07/15/2006  . MENINGIOMA 08/27/2006    Occipital meningioma, surgery, [facility], 2008 /  MRI, [facility], June, 2011, no change in lesion, history believe the patient had a gamma knife treatment of a right occipital atypical meningioma  . OSTEOARTHRITIS 07/15/2006  . PULMONARY NODULE 01/14/2007    [facility], December, 2011  . Takotsubo syndrome 04/22/2008    MI, May, 2008, question takotsubo event  . THYROID NODULE 01/14/2007  . IBS (irritable bowel syndrome)   . DJD (degenerative joint disease)     L shoulder impingement  . Ejection fraction     EF 60%, echo, July, 2010, (  EF improved after tach a suitable event 2008)  . Subarachnoid hemorrhage following injury 2007    Subarachnoid hemorrhage secondary to a fall December, 2007  . Bradycardia     August, 2013  . Family history of anesthesia complication     daughter has a hard time waking up  . H/O hiatal hernia   . Abnormal CT of the chest     Stable patchy/nodular and ground-glass opacities in the  right upper lobe, While grossly unchanged, low grade adenocarcinoma remains possible.    History   Social History  . Marital Status: Widowed    Spouse Name: N/A    Number of Children: 4  . Years of Education: N/A   Occupational History  .     Social History Main Topics  . Smoking status: Never Smoker   . Smokeless tobacco: Never Used  . Alcohol Use: No  . Drug Use: No  . Sexual Activity: No   Other Topics Concern  . Not on file   Social History Narrative   Widowed. Lives in [place], [place] with daughter.     Past Surgical History  Procedure Laterality Date  . Cholecystectomy    . Abdominal hysterectomy    . Hip surgery    . Brain tumor excision      Meningioma; treated at [facility] in 2008  . Shoulder surgery    . Joint replacement      rt THR    Family History  Problem Relation Age of Onset  . Heart attack Brother 84    Allergies  Allergen Reactions  . Atorvastatin Other (See Comments)    Raises liver enzymes (takes pravastatin at home)  . Influenza Vaccines   . Phenytoin Other (See Comments)    Blood clots  . Promethazine Hcl Other (See Comments)    Severe sedation  . Sulfacetamide Sodium     REACTION:  sulfa  . Sulfamethoxazole     REACTION: unspecified    Current Outpatient Prescriptions on File Prior to Visit  Medication Sig Dispense Refill  . clopidogrel (PLAVIX) 75 MG tablet Take 75 mg by mouth daily.      . cyanocobalamin (,VITAMIN B-12,) 1000 MCG/ML injection Inject 1 mL (1,000 mcg total) into the muscle every 30 (thirty) days.  3 mL  1  . isosorbide mononitrate (IMDUR) 30 MG 24 hr tablet Take 0.5 tablets (15 mg total) by mouth daily.  90 tablet  5  . methimazole (TAPAZOLE) 10 MG tablet Take 10 mg by mouth daily.      Marland Kitchen NEEDLE, DISP, 25 G (BD DISP NEEDLES) 25G X 5/8" MISC 1 each by Does not apply route every 30 (thirty) days.  50 each  1  . nitroGLYCERIN (NITROSTAT) 0.4 MG SL tablet Place 1 tablet (0.4 mg total) under the tongue every 5 (five)  minutes as needed. For chest pain.  15 tablet  0  . pravastatin (PRAVACHOL) 20 MG tablet Take 20 mg by mouth daily.      . Syringe, Disposable, 3 ML MISC 1 each by Does not apply route every 30 (thirty) days.  25 each  1  . traMADol (ULTRAM) 50 MG tablet Take 50 mg by mouth every 6 (six) hours as needed for pain. For pain.      Marland Kitchen triamcinolone ointment (KENALOG) 0.1 % Apply 1 application topically 2 (two) times daily as needed. For dry skin.       No current facility-administered medications on file prior to visit.    BP 110/64  Pulse 64  Temp(Src) 97.7 F (36.5 C) (Oral)  Wt 119 lb (53.978 kg)  BMI 21.09 kg/m2  SpO2 97%       Review of Systems  Constitutional: Positive for fatigue.  HENT: Negative for hearing loss, congestion, sore throat, rhinorrhea, dental problem, sinus pressure and tinnitus.   Eyes: Negative for pain, discharge and visual disturbance.  Respiratory: Negative for cough and shortness of breath.   Cardiovascular: Negative for chest pain, palpitations and leg swelling.  Gastrointestinal: Negative for nausea, vomiting, abdominal pain, diarrhea, constipation, blood in stool and abdominal distention.  Genitourinary: Positive for decreased urine volume. Negative for dysuria, urgency, frequency, hematuria, flank pain, vaginal bleeding, vaginal discharge, difficulty urinating, vaginal pain and pelvic pain.  Musculoskeletal: Positive for back pain and arthralgias. Negative for joint swelling and gait problem.  Skin: Negative for rash.  Neurological: Negative for dizziness, syncope, speech difficulty, weakness, numbness and headaches.  Hematological: Negative for adenopathy.  Psychiatric/Behavioral: Negative for behavioral problems, dysphoric mood and agitation. The patient is not nervous/anxious.        Objective:   Physical Exam  Constitutional: She is oriented to person, place, and time. She appears well-developed and well-nourished.  Elderly frail in no acute  distress Blood pressure 110/64 Afebrile  HENT:  Head: Normocephalic.  Right Ear: External ear normal.  Left Ear: External ear normal.  Mouth/Throat: Oropharynx is clear and moist.  Eyes: Conjunctivae and EOM are normal. Pupils are equal, round, and reactive to light.  Neck: Normal range of motion. Neck supple. No thyromegaly present.  Cardiovascular: Normal rate, regular rhythm, normal heart sounds and intact distal pulses.   Pulmonary/Chest: Effort normal and breath sounds normal.  Abdominal: Soft. Bowel sounds are normal. She exhibits no mass. There is no tenderness.  Musculoskeletal: Normal range of motion.  Left upper back discomfort No signs of active arthritis  Lymphadenopathy:  She has no cervical adenopathy.  Neurological: She is alert and oriented to person, place, and time.  Skin: Skin is warm and dry. No rash noted.  Psychiatric: She has a normal mood and affect. Her behavior is normal.          Assessment & Plan:  Fatigue Rule out UTI Hypertension stable CAD stable Osteoarthritis. Continue symptomatic therapy Hyperthyroidism. Followup TSH

## 2012-09-17 NOTE — Patient Instructions (Signed)
Call or return to clinic prn if these symptoms worsen or fail to improve as anticipated.  Laboratory studies were obtained on your visit today and will be available in one or 2 business days.  IF there are any significant abnormalities, the office will call and discuss the results.  If there results are unremarkable, they will be available for your review after one week in MY CHART.

## 2012-09-20 ENCOUNTER — Emergency Department (HOSPITAL_COMMUNITY)
Admission: EM | Admit: 2012-09-20 | Discharge: 2012-09-21 | Disposition: A | Discharge status: Home / Self Care | Payer: Medicare Other | Attending: Emergency Medicine | Admitting: Emergency Medicine

## 2012-09-20 ENCOUNTER — Encounter (HOSPITAL_COMMUNITY): Payer: Self-pay | Admitting: Emergency Medicine

## 2012-09-20 ENCOUNTER — Emergency Department (HOSPITAL_COMMUNITY): Payer: Medicare Other

## 2012-09-20 DIAGNOSIS — Z872 Personal history of diseases of the skin and subcutaneous tissue: Secondary | ICD-10-CM | POA: Insufficient documentation

## 2012-09-20 DIAGNOSIS — R5383 Other fatigue: Secondary | ICD-10-CM | POA: Insufficient documentation

## 2012-09-20 DIAGNOSIS — Z7902 Long term (current) use of antithrombotics/antiplatelets: Secondary | ICD-10-CM | POA: Insufficient documentation

## 2012-09-20 DIAGNOSIS — I2510 Atherosclerotic heart disease of native coronary artery without angina pectoris: Secondary | ICD-10-CM | POA: Insufficient documentation

## 2012-09-20 DIAGNOSIS — I252 Old myocardial infarction: Secondary | ICD-10-CM | POA: Insufficient documentation

## 2012-09-20 DIAGNOSIS — Z79899 Other long term (current) drug therapy: Secondary | ICD-10-CM | POA: Insufficient documentation

## 2012-09-20 DIAGNOSIS — I10 Essential (primary) hypertension: Secondary | ICD-10-CM | POA: Insufficient documentation

## 2012-09-20 DIAGNOSIS — Z8719 Personal history of other diseases of the digestive system: Secondary | ICD-10-CM | POA: Insufficient documentation

## 2012-09-20 DIAGNOSIS — Z8709 Personal history of other diseases of the respiratory system: Secondary | ICD-10-CM | POA: Insufficient documentation

## 2012-09-20 DIAGNOSIS — Z9889 Other specified postprocedural states: Secondary | ICD-10-CM | POA: Insufficient documentation

## 2012-09-20 DIAGNOSIS — R251 Tremor, unspecified: Secondary | ICD-10-CM

## 2012-09-20 DIAGNOSIS — R5381 Other malaise: Secondary | ICD-10-CM | POA: Insufficient documentation

## 2012-09-20 DIAGNOSIS — Z8673 Personal history of transient ischemic attack (TIA), and cerebral infarction without residual deficits: Secondary | ICD-10-CM | POA: Insufficient documentation

## 2012-09-20 DIAGNOSIS — Z87828 Personal history of other (healed) physical injury and trauma: Secondary | ICD-10-CM | POA: Insufficient documentation

## 2012-09-20 DIAGNOSIS — R531 Weakness: Secondary | ICD-10-CM

## 2012-09-20 DIAGNOSIS — Z8669 Personal history of other diseases of the nervous system and sense organs: Secondary | ICD-10-CM | POA: Insufficient documentation

## 2012-09-20 DIAGNOSIS — Z8739 Personal history of other diseases of the musculoskeletal system and connective tissue: Secondary | ICD-10-CM | POA: Insufficient documentation

## 2012-09-20 DIAGNOSIS — Z8679 Personal history of other diseases of the circulatory system: Secondary | ICD-10-CM | POA: Insufficient documentation

## 2012-09-20 DIAGNOSIS — Z862 Personal history of diseases of the blood and blood-forming organs and certain disorders involving the immune mechanism: Secondary | ICD-10-CM | POA: Insufficient documentation

## 2012-09-20 DIAGNOSIS — Z8639 Personal history of other endocrine, nutritional and metabolic disease: Secondary | ICD-10-CM | POA: Insufficient documentation

## 2012-09-20 DIAGNOSIS — R259 Unspecified abnormal involuntary movements: Secondary | ICD-10-CM | POA: Insufficient documentation

## 2012-09-20 LAB — CBC
Hemoglobin: 11.3 g/dL — ABNORMAL LOW (ref 12.0–15.0)
MCH: 29.4 pg (ref 26.0–34.0)
Platelets: 199 10*3/uL (ref 150–400)
RBC: 3.84 MIL/uL — ABNORMAL LOW (ref 3.87–5.11)
WBC: 6.2 10*3/uL (ref 4.0–10.5)

## 2012-09-20 LAB — URINALYSIS, ROUTINE W REFLEX MICROSCOPIC
Bilirubin Urine: NEGATIVE
Hgb urine dipstick: NEGATIVE
Ketones, ur: NEGATIVE mg/dL
Nitrite: NEGATIVE
Protein, ur: NEGATIVE mg/dL
Urobilinogen, UA: 0.2 mg/dL (ref 0.0–1.0)

## 2012-09-20 LAB — COMPREHENSIVE METABOLIC PANEL
ALT: 12 U/L (ref 0–35)
AST: 20 U/L (ref 0–37)
Alkaline Phosphatase: 114 U/L (ref 39–117)
CO2: 24 mEq/L (ref 19–32)
Calcium: 9.2 mg/dL (ref 8.4–10.5)
Chloride: 103 mEq/L (ref 96–112)
GFR calc Af Amer: 67 mL/min — ABNORMAL LOW (ref >90)
GFR calc non Af Amer: 58 mL/min — ABNORMAL LOW (ref >90)
Glucose, Bld: 105 mg/dL — ABNORMAL HIGH (ref 70–99)
Potassium: 3.9 mEq/L (ref 3.5–5.1)
Sodium: 138 mEq/L (ref 135–145)
Total Bilirubin: 0.2 mg/dL — ABNORMAL LOW (ref 0.3–1.2)

## 2012-09-20 LAB — URINE MICROSCOPIC-ADD ON

## 2012-09-20 MED ORDER — SODIUM CHLORIDE 0.9 % IV SOLN
INTRAVENOUS | Status: DC
  Administered 2012-09-20: 22:00:00 via INTRAVENOUS

## 2012-09-20 NOTE — ED Notes (Addendum)
Per EMS: pt coming from home with c/o weakness for the past week. Pt seen at PCP, no signs of UTI, unknown if chest x-ray was done. Pt is usually up walking around on her own. No neuro or unilateral deficits. Pt reports full body general malaise. EKG showed afib. Pt is A&Ox4, respirations equal and unlabored, skin feels hot to the touch and dry

## 2012-09-20 NOTE — ED Notes (Signed)
Pt refusing in and out cath. Pt agreed to try to void in bedpan.

## 2012-09-20 NOTE — ED Notes (Signed)
Pt. returned from XR. 

## 2012-09-20 NOTE — ED Notes (Signed)
Pt transported to CT ?

## 2012-09-20 NOTE — ED Notes (Signed)
Patient transported to CT 

## 2012-09-20 NOTE — ED Notes (Signed)
Patient transported to X-ray 

## 2012-09-20 NOTE — ED Notes (Signed)
Pt returned from CT °

## 2012-09-20 NOTE — ED Provider Notes (Signed)
CSN: [id number]     Arrival date & time 09/20/12  2050 History     First MD Initiated Contact with Patient 09/20/12 2055     Chief complaint: feels weak, shaky   (Consider location/radiation/quality/duration/timing/severity/associated sxs/prior Treatment) The history is provided by the patient.  pt states feels generally weak/shaky in the past 1-2 weeks, worse today. States saw pcp for same a couple days ago and no specific dx made.  Pt indicates at home tonight, at rest, and felt shaky all over and generally weak. No focal numbness or weakness. No fever or chills. No trauma or fall. Denies headache or neck pain. No cp or sob. No abd pain or nvd. Normal appetite today. No dysuria or gu c/o x states feels is if has to urge to urinate now. No back or flank pain. Denies recent change in meds or new meds. Compliant w normal meds.     Past Medical History  Diagnosis Date  . ANEMIA, PERNICIOUS 09/10/2006  . CORONARY ARTERY DISEASE 11/07/2008    a. NSTEMI 2001 & STEMI 2008 managed medically. b. Takotsubo 06/2006. c. Cath 2009 -> med rx.  . CVA WITH LEFT HEMIPARESIS 07/22/2007    May, 2009, neurology decided aspirin and Plavix at that time.  . DERMATITIS 03/02/2007  . HYPERTENSION 07/15/2006  . MENINGIOMA 08/27/2006    Occipital meningioma, surgery, [facility], 2008 /  MRI, [place], June, 2011, no change in lesion, history believe the patient had a gamma knife treatment of a right occipital atypical meningioma  . OSTEOARTHRITIS 07/15/2006  . PULMONARY NODULE 01/14/2007    [facility], December, 2011  . Takotsubo syndrome 04/22/2008    MI, May, 2008, question takotsubo event  . THYROID NODULE 01/14/2007  . IBS (irritable bowel syndrome)   . DJD (degenerative joint disease)     L shoulder impingement  . Ejection fraction     EF 60%, echo, July, 2010, (  EF improved after tach a suitable event 2008)  . Subarachnoid hemorrhage following injury 2007    Subarachnoid hemorrhage secondary to a fall December,  2007  . Bradycardia     August, 2013  . Family history of anesthesia complication     daughter has a hard time waking up  . H/O hiatal hernia   . Abnormal CT of the chest     Stable patchy/nodular and ground-glass opacities in the right upper lobe, While grossly unchanged, low grade adenocarcinoma remains possible.   Past Surgical History  Procedure Laterality Date  . Cholecystectomy    . Abdominal hysterectomy    . Hip surgery    . Brain tumor excision      Meningioma; treated at [facility] in 2008  . Shoulder surgery    . Joint replacement      rt THR   Family History  Problem Relation Age of Onset  . Heart attack Brother 28   History  Substance Use Topics  . Smoking status: Never Smoker   . Smokeless tobacco: Never Used  . Alcohol Use: No   OB History   Grav Para Term Preterm Abortions TAB SAB Ect Mult Living                 Review of Systems  Constitutional: Negative for fever and chills.  HENT: Negative for sore throat, neck pain and neck stiffness.   Eyes: Negative for redness.  Respiratory: Negative for cough and shortness of breath.   Cardiovascular: Negative for chest pain.  Gastrointestinal: Negative for vomiting, abdominal pain,  diarrhea and blood in stool.  Genitourinary: Negative for dysuria and flank pain.  Musculoskeletal: Negative for back pain.  Skin: Negative for rash.  Neurological: Negative for numbness and headaches.  Hematological: Does not bruise/bleed easily.  Psychiatric/Behavioral: Negative for confusion.    Allergies  Atorvastatin; Influenza vaccines; Phenytoin; Promethazine hcl; Sulfacetamide sodium; and Sulfamethoxazole  Home Medications   Current Outpatient Rx  Name  Route  Sig  Dispense  Refill  . clopidogrel (PLAVIX) 75 MG tablet   Oral   Take 75 mg by mouth daily.         . cyanocobalamin (,VITAMIN B-12,) 1000 MCG/ML injection   Intramuscular   Inject 1 mL (1,000 mcg total) into the muscle every 30 (thirty) days.   3 mL    1   . isosorbide mononitrate (IMDUR) 30 MG 24 hr tablet   Oral   Take 0.5 tablets (15 mg total) by mouth daily.   90 tablet   5   . methimazole (TAPAZOLE) 10 MG tablet   Oral   Take 10 mg by mouth daily.         Marland Kitchen NEEDLE, DISP, 25 G (BD DISP NEEDLES) 25G X 5/8" MISC   Does not apply   1 each by Does not apply route every 30 (thirty) days.   50 each   1   . nitroGLYCERIN (NITROSTAT) 0.4 MG SL tablet   Sublingual   Place 1 tablet (0.4 mg total) under the tongue every 5 (five) minutes as needed. For chest pain.   15 tablet   0   . pravastatin (PRAVACHOL) 20 MG tablet   Oral   Take 20 mg by mouth daily.         . Syringe, Disposable, 3 ML MISC   Does not apply   1 each by Does not apply route every 30 (thirty) days.   25 each   1   . traMADol (ULTRAM) 50 MG tablet   Oral   Take 50 mg by mouth every 6 (six) hours as needed for pain. For pain.         Marland Kitchen triamcinolone ointment (KENALOG) 0.1 %   Topical   Apply 1 application topically 2 (two) times daily as needed. For dry skin.          SpO2 97% Physical Exam  Nursing note and vitals reviewed. Constitutional: She is oriented to person, place, and time. She appears well-developed and well-nourished. No distress.  HENT:  Mouth/Throat: Oropharynx is clear and moist.  No temporal tenderness.   Eyes: Conjunctivae are normal. Pupils are equal, round, and reactive to light. No scleral icterus.  Neck: Normal range of motion. Neck supple. No tracheal deviation present.  No stiffness or rigidity  Cardiovascular: Normal rate, regular rhythm, normal heart sounds and intact distal pulses.   No murmur heard. Pulmonary/Chest: Effort normal and breath sounds normal. No respiratory distress.  Abdominal: Soft. Normal appearance and bowel sounds are normal. She exhibits no distension and no mass. There is no tenderness. There is no rebound and no guarding.  Genitourinary:  No cva tenderness.   Musculoskeletal: She exhibits  no edema and no tenderness.  Neurological: She is alert and oriented to person, place, and time.  Motor intact bil. Intermittent gen shakiness. No seizure activity.   Skin: Skin is warm and dry. No rash noted. She is not diaphoretic.  Psychiatric: She has a normal mood and affect.    ED Course   Procedures (including critical care time)  Results for orders placed during the hospital encounter of 09/20/12  CBC      Result Value Range   WBC 6.2  4.0 - 10.5 K/uL   RBC 3.84 (*) 3.87 - 5.11 MIL/uL   Hemoglobin 11.3 (*) 12.0 - 15.0 g/dL   HCT 16.1 (*) 09.6 - 04.5 %   MCV 88.3  78.0 - 100.0 fL   MCH 29.4  26.0 - 34.0 pg   MCHC 33.3  30.0 - 36.0 g/dL   RDW 40.9  81.1 - 91.4 %   Platelets 199  150 - 400 K/uL  COMPREHENSIVE METABOLIC PANEL      Result Value Range   Sodium 138  135 - 145 mEq/L   Potassium 3.9  3.5 - 5.1 mEq/L   Chloride 103  96 - 112 mEq/L   CO2 24  19 - 32 mEq/L   Glucose, Bld 105 (*) 70 - 99 mg/dL   BUN 17  6 - 23 mg/dL   Creatinine, Ser 7.82  0.50 - 1.10 mg/dL   Calcium 9.2  8.4 - 95.6 mg/dL   Total Protein 6.4  6.0 - 8.3 g/dL   Albumin 3.9  3.5 - 5.2 g/dL   AST 20  0 - 37 U/L   ALT 12  0 - 35 U/L   Alkaline Phosphatase 114  39 - 117 U/L   Total Bilirubin 0.2 (*) 0.3 - 1.2 mg/dL   GFR calc non Af Amer 58 (*) >90 mL/min   GFR calc Af Amer 67 (*) >90 mL/min  URINALYSIS, ROUTINE W REFLEX MICROSCOPIC      Result Value Range   Color, Urine STRAW (*) YELLOW   APPearance CLEAR  CLEAR   Specific Gravity, Urine 1.007  1.005 - 1.030   pH 7.0  5.0 - 8.0   Glucose, UA NEGATIVE  NEGATIVE mg/dL   Hgb urine dipstick NEGATIVE  NEGATIVE   Bilirubin Urine NEGATIVE  NEGATIVE   Ketones, ur NEGATIVE  NEGATIVE mg/dL   Protein, ur NEGATIVE  NEGATIVE mg/dL   Urobilinogen, UA 0.2  0.0 - 1.0 mg/dL   Nitrite NEGATIVE  NEGATIVE   Leukocytes, UA TRACE (*) NEGATIVE  TROPONIN I      Result Value Range   Troponin I <0.30  <0.30 ng/mL  URINE MICROSCOPIC-ADD ON      Result Value  Range   Squamous Epithelial / LPF FEW (*) RARE   WBC, UA 0-2  <3 WBC/hpf   RBC / HPF 0-2  <3 RBC/hpf   Bacteria, UA RARE  RARE   Dg Chest 2 View  09/20/2012   *RADIOLOGY REPORT*  Clinical Data: Pain.  CHEST - 2 VIEW  Comparison: 04/29/2012  Findings: Persistent density in the right upper lobe, stable, likely scarring. There is hyperinflation of the lungs compatible with COPD.  Mild cardiomegaly.  No acute airspace opacities.  No effusions.  No acute bony abnormality.  IMPRESSION: COPD/chronic changes.  No acute findings.   Original Report Authenticated By: Charlett Nose, M.D.   Ct Head Wo Contrast  09/20/2012   *RADIOLOGY REPORT*  Clinical Data: Weakness and fall body of general malaise for a week.  CT HEAD WITHOUT CONTRAST  Technique:  Contiguous axial images were obtained from the base of the skull through the vertex without contrast.  Comparison: 04/29/2012  Findings: Postoperative changes in the right posterior occipital region with craniotomy and underlying encephalomalacia in the right occipital lobe.  These changes are stable since the previous study. There is mild diffuse  cerebral and cerebellar atrophy.  Mild ventricular dilatation consistent with central atrophy.  Patchy low attenuation changes in the deep white matter consistent with small vessel ischemia.  No mass effect or midline shift.  No abnormal extra-axial fluid collections.  The gray-white matter junctions are distinct.  The basal cisterns are not effaced.  No evidence of acute intracranial hemorrhage.  Tortuous vertebral basilar arteries.  Calcification in the internal carotid and vertebral basilar systems.  No depressed skull fractures.  Visualized portions of the paranasal sinuses and mastoid air cells are not opacified.  No significant change since previous study.  IMPRESSION: No acute intracranial abnormalities.  Stable postoperative changes in the right occipital region with underlying encephalomalacia. Chronic atrophy and small  vessel ischemic changes.   Original Report Authenticated By: Burman Nieves, M.D.      MDM  Iv ns. Labs. Cxr.    Date: 09/20/2012  Rate: 67  Rhythm: normal sinus rhythm  QRS Axis: normal  Intervals: normal  ST/T Wave abnormalities: normal  Conduction Disutrbances:none  Narrative Interpretation:   Old EKG Reviewed: unchanged  Recheck pt alert, content. No tremors. Pt denies pain.   Po fluids, meal.  Pt afeb.   Ct, xr, labs unremarkable for acute process.  Earlier tremor appeared voluntary, anxiety related.  Resolved currently.  After feeling gen weak x 1 week, ecg unchanged and trop neg.  Pt appears stable for d/c. rec close pcp follow up in next couple days.     Suzi Roots, MD 09/20/12 [phone]

## 2012-09-20 NOTE — ED Notes (Signed)
Pt transported to XR.  

## 2012-09-21 ENCOUNTER — Ambulatory Visit (INDEPENDENT_AMBULATORY_CARE_PROVIDER_SITE_OTHER): Payer: Medicare Other | Admitting: Endocrinology

## 2012-09-21 ENCOUNTER — Encounter: Payer: Self-pay | Admitting: Endocrinology

## 2012-09-21 VITALS — BP 122/70 | HR 75 | Ht 65.0 in | Wt 116.0 lb

## 2012-09-21 DIAGNOSIS — E0590 Thyrotoxicosis, unspecified without thyrotoxic crisis or storm: Secondary | ICD-10-CM

## 2012-09-21 MED ORDER — METHIMAZOLE 5 MG PO TABS: ORAL_TABLET | ORAL | Status: DC

## 2012-09-21 NOTE — Progress Notes (Signed)
Subjective:    Patient ID: [name], female    DOB: 10-Nov-1922, 79 y.o.   MRN: [id number]  HPI Pt was noted in the hospital in sept, 2013, to have hyperthyroidism.  She was started on tapazole.  Since on the tapazole, she feels no different.  Her main symptom continues to be severe tremor of the hands, and associated generalized weakness.  Pt's dtr says pt takes tapazole only 5 mg qd (1/2 of 10 mg).  She was seen in ER a few days ago. Past Medical History  Diagnosis Date  . ANEMIA, PERNICIOUS 09/10/2006  . CORONARY ARTERY DISEASE 11/07/2008    a. NSTEMI 2001 & STEMI 2008 managed medically. b. Takotsubo 06/2006. c. Cath 2009 -> med rx.  . CVA WITH LEFT HEMIPARESIS 07/22/2007    May, 2009, neurology decided aspirin and Plavix at that time.  . DERMATITIS 03/02/2007  . HYPERTENSION 07/15/2006  . MENINGIOMA 08/27/2006    Occipital meningioma, surgery, [facility], 2008 /  MRI, [place], June, 2011, no change in lesion, history believe the patient had a gamma knife treatment of a right occipital atypical meningioma  . OSTEOARTHRITIS 07/15/2006  . PULMONARY NODULE 01/14/2007    [facility], December, 2011  . Takotsubo syndrome 04/22/2008    MI, May, 2008, question takotsubo event  . THYROID NODULE 01/14/2007  . IBS (irritable bowel syndrome)   . DJD (degenerative joint disease)     L shoulder impingement  . Ejection fraction     EF 60%, echo, July, 2010, (  EF improved after tach a suitable event 2008)  . Subarachnoid hemorrhage following injury 2007    Subarachnoid hemorrhage secondary to a fall December, 2007  . Bradycardia     August, 2013  . Family history of anesthesia complication     daughter has a hard time waking up  . H/O hiatal hernia   . Abnormal CT of the chest     Stable patchy/nodular and ground-glass opacities in the right upper lobe, While grossly unchanged, low grade adenocarcinoma remains possible.    Past Surgical History  Procedure Laterality Date  . Cholecystectomy    .  Abdominal hysterectomy    . Hip surgery    . Brain tumor excision      Meningioma; treated at [facility] in 2008  . Shoulder surgery    . Joint replacement      rt THR    History   Social History  . Marital Status: Widowed    Spouse Name: N/A    Number of Children: 4  . Years of Education: N/A   Occupational History  .     Social History Main Topics  . Smoking status: Never Smoker   . Smokeless tobacco: Never Used  . Alcohol Use: No  . Drug Use: No  . Sexual Activity: No   Other Topics Concern  . Not on file   Social History Narrative   Widowed. Lives in [place], [place] with daughter.     Current Outpatient Prescriptions on File Prior to Visit  Medication Sig Dispense Refill  . clopidogrel (PLAVIX) 75 MG tablet Take 75 mg by mouth daily.      . cyanocobalamin (,VITAMIN B-12,) 1000 MCG/ML injection Inject 1 mL (1,000 mcg total) into the muscle every 30 (thirty) days.  3 mL  1  . isosorbide mononitrate (IMDUR) 30 MG 24 hr tablet Take 0.5 tablets (15 mg total) by mouth daily.  90 tablet  5  . NEEDLE, DISP, 25 G (BD DISP  NEEDLES) 25G X 5/8" MISC 1 each by Does not apply route every 30 (thirty) days.  50 each  1  . nitroGLYCERIN (NITROSTAT) 0.4 MG SL tablet Place 1 tablet (0.4 mg total) under the tongue every 5 (five) minutes as needed. For chest pain.  15 tablet  0  . Syringe, Disposable, 3 ML MISC 1 each by Does not apply route every 30 (thirty) days.  25 each  1  . traMADol (ULTRAM) 50 MG tablet Take 50 mg by mouth every 6 (six) hours as needed for pain. For pain.      Marland Kitchen triamcinolone ointment (KENALOG) 0.1 % Apply 1 application topically 2 (two) times daily as needed. For dry skin.       No current facility-administered medications on file prior to visit.    Allergies  Allergen Reactions  . Atorvastatin Other (See Comments)    Raises liver enzymes (takes pravastatin at home)  . Influenza Vaccines   . Phenytoin Other (See Comments)    Blood clots  . Promethazine Hcl  Other (See Comments)    Severe sedation  . Sulfacetamide Sodium     REACTION: sulfa  . Sulfamethoxazole     REACTION: unspecified    Family History  Problem Relation Age of Onset  . Heart attack Brother 84    BP 122/70  Pulse 75  Ht 5\' 5"  (1.651 m)  Wt 116 lb (52.617 kg)  BMI 19.3 kg/m2  SpO2 98%  Review of Systems Denies fever and weight change    Objective:   Physical Exam VITAL SIGNS:  See vs page GENERAL: no distress NECK: There is no palpable thyroid enlargement.  No thyroid nodule is palpable.  No palpable.   lymphadenopathy at the anterior neck. Neuro: no tremor.    Lab Results  Component Value Date   TSH 5.81* 09/17/2012      Assessment & Plan:  Hyperthyroidism, overcontrolled. Tremor.  Not thyroid-related.   Generalized weakness, not thyroid-related

## 2012-09-21 NOTE — Patient Instructions (Addendum)
Please stop the methimazole x 1 week, then resume at 5 mg 3x a week (mon, wed, and fri). if ever you have fever while taking methimazole, stop it and call us, because of the risk of a rare side-effect. Please come back for a follow-up appointment in 3 months.

## 2012-10-02 ENCOUNTER — Telehealth: Payer: Self-pay | Admitting: Endocrinology

## 2012-10-02 NOTE — Telephone Encounter (Signed)
Pt's dtr advised 

## 2012-10-02 NOTE — Telephone Encounter (Signed)
dtr states med is making her dizzy

## 2012-10-02 NOTE — Telephone Encounter (Signed)
Please see your pcp for dizziness--not due to the thyroid pill.

## 2012-10-02 NOTE — Telephone Encounter (Signed)
Daughter called, has concerns w/ patient taking Tapazole 50mg , may be causing side effects. Please [phone]

## 2012-10-02 NOTE — Telephone Encounter (Signed)
i have reviewed.  It is correct.  We reduced the medication, because thyroid was slightly low.

## 2012-10-06 ENCOUNTER — Ambulatory Visit (INDEPENDENT_AMBULATORY_CARE_PROVIDER_SITE_OTHER): Payer: Medicare Other | Admitting: Internal Medicine

## 2012-10-06 ENCOUNTER — Encounter: Payer: Self-pay | Admitting: Internal Medicine

## 2012-10-06 VITALS — BP 120/80 | HR 71 | Temp 98.1°F | Resp 20 | Wt 117.0 lb

## 2012-10-06 DIAGNOSIS — D510 Vitamin B12 deficiency anemia due to intrinsic factor deficiency: Secondary | ICD-10-CM

## 2012-10-06 DIAGNOSIS — E0590 Thyrotoxicosis, unspecified without thyrotoxic crisis or storm: Secondary | ICD-10-CM

## 2012-10-06 DIAGNOSIS — I10 Essential (primary) hypertension: Secondary | ICD-10-CM

## 2012-10-06 DIAGNOSIS — M1990 Unspecified osteoarthritis, unspecified site: Secondary | ICD-10-CM

## 2012-10-06 MED ORDER — SERTRALINE HCL 25 MG PO TABS: 25.0000 mg | ORAL_TABLET | Freq: Every day | ORAL | Status: DC

## 2012-10-06 NOTE — Progress Notes (Signed)
Subjective:    Patient ID: [name], female    DOB: 02/13/1922, 79 y.o.   MRN: [id number]  HPI  79 year old patient who is seen today in followup. She is followed by endocrinology for hyperthyroidism which has been treated medically. TSH was recently elevated and her medications down titrated. She has multiple somatic complaints including fatigue shaking paresthesias of the feet and abdominal discomfort. She also describes back and neck discomfort.  Wt Readings from Last 3 Encounters:  10/06/12 117 lb (53.071 kg)  09/21/12 116 lb (52.617 kg)  09/17/12 119 lb (53.978 kg)    Past Medical History  Diagnosis Date  . ANEMIA, PERNICIOUS 09/10/2006  . CORONARY ARTERY DISEASE 11/07/2008    a. NSTEMI 2001 & STEMI 2008 managed medically. b. Takotsubo 06/2006. c. Cath 2009 -> med rx.  . CVA WITH LEFT HEMIPARESIS 07/22/2007    May, 2009, neurology decided aspirin and Plavix at that time.  . DERMATITIS 03/02/2007  . HYPERTENSION 07/15/2006  . MENINGIOMA 08/27/2006    Occipital meningioma, surgery, [facility], 2008 /  MRI, [place], June, 2011, no change in lesion, history believe the patient had a gamma knife treatment of a right occipital atypical meningioma  . OSTEOARTHRITIS 07/15/2006  . PULMONARY NODULE 01/14/2007    [facility], December, 2011  . Takotsubo syndrome 04/22/2008    MI, May, 2008, question takotsubo event  . THYROID NODULE 01/14/2007  . IBS (irritable bowel syndrome)   . DJD (degenerative joint disease)     L shoulder impingement  . Ejection fraction     EF 60%, echo, July, 2010, (  EF improved after tach a suitable event 2008)  . Subarachnoid hemorrhage following injury 2007    Subarachnoid hemorrhage secondary to a fall December, 2007  . Bradycardia     August, 2013  . Family history of anesthesia complication     daughter has a hard time waking up  . H/O hiatal hernia   . Abnormal CT of the chest     Stable patchy/nodular and ground-glass opacities in the right upper lobe, While  grossly unchanged, low grade adenocarcinoma remains possible.    History   Social History  . Marital Status: Widowed    Spouse Name: N/A    Number of Children: 4  . Years of Education: N/A   Occupational History  .     Social History Main Topics  . Smoking status: Never Smoker   . Smokeless tobacco: Never Used  . Alcohol Use: No  . Drug Use: No  . Sexual Activity: No   Other Topics Concern  . Not on file   Social History Narrative   Widowed. Lives in [place], [place] with daughter.     Past Surgical History  Procedure Laterality Date  . Cholecystectomy    . Abdominal hysterectomy    . Hip surgery    . Brain tumor excision      Meningioma; treated at [facility] in 2008  . Shoulder surgery    . Joint replacement      rt THR    Family History  Problem Relation Age of Onset  . Heart attack Brother 84    Allergies  Allergen Reactions  . Atorvastatin Other (See Comments)    Raises liver enzymes (takes pravastatin at home)  . Influenza Vaccines   . Phenytoin Other (See Comments)    Blood clots  . Promethazine Hcl Other (See Comments)    Severe sedation  . Sulfacetamide Sodium     REACTION: sulfa  .  Sulfamethoxazole     REACTION: unspecified    Current Outpatient Prescriptions on File Prior to Visit  Medication Sig Dispense Refill  . clopidogrel (PLAVIX) 75 MG tablet Take 75 mg by mouth daily.      . cyanocobalamin (,VITAMIN B-12,) 1000 MCG/ML injection Inject 1 mL (1,000 mcg total) into the muscle every 30 (thirty) days.  3 mL  1  . isosorbide mononitrate (IMDUR) 30 MG 24 hr tablet Take 0.5 tablets (15 mg total) by mouth daily.  90 tablet  5  . methimazole (TAPAZOLE) 5 MG tablet 1 tab, 3 times a week.  40 tablet  1  . NEEDLE, DISP, 25 G (BD DISP NEEDLES) 25G X 5/8" MISC 1 each by Does not apply route every 30 (thirty) days.  50 each  1  . nitroGLYCERIN (NITROSTAT) 0.4 MG SL tablet Place 1 tablet (0.4 mg total) under the tongue every 5 (five) minutes as needed.  For chest pain.  15 tablet  0  . Syringe, Disposable, 3 ML MISC 1 each by Does not apply route every 30 (thirty) days.  25 each  1  . traMADol (ULTRAM) 50 MG tablet Take 50 mg by mouth every 6 (six) hours as needed for pain. For pain.      Marland Kitchen triamcinolone ointment (KENALOG) 0.1 % Apply 1 application topically 2 (two) times daily as needed. For dry skin.       No current facility-administered medications on file prior to visit.    BP 120/80  Pulse 71  Temp(Src) 98.1 F (36.7 C) (Oral)  Resp 20  Wt 117 lb (53.071 kg)  BMI 19.47 kg/m2  SpO2 98%     Review of Systems  Constitutional: Positive for appetite change and fatigue.  HENT: Negative for hearing loss, congestion, sore throat, rhinorrhea, dental problem, sinus pressure and tinnitus.   Eyes: Negative for pain, discharge and visual disturbance.  Respiratory: Negative for cough and shortness of breath.   Cardiovascular: Negative for chest pain, palpitations and leg swelling.  Gastrointestinal: Positive for abdominal pain. Negative for nausea, vomiting, diarrhea, constipation, blood in stool and abdominal distention.  Genitourinary: Negative for dysuria, urgency, frequency, hematuria, flank pain, vaginal bleeding, vaginal discharge, difficulty urinating, vaginal pain and pelvic pain.  Musculoskeletal: Negative for joint swelling, arthralgias and gait problem.  Skin: Negative for rash.  Neurological: Positive for tremors and weakness. Negative for dizziness, syncope, speech difficulty, numbness and headaches.  Hematological: Negative for adenopathy.  Psychiatric/Behavioral: Negative for behavioral problems, dysphoric mood and agitation. The patient is nervous/anxious.        Objective:   Physical Exam  Constitutional: She is oriented to person, place, and time. She appears well-developed and well-nourished.  HENT:  Head: Normocephalic.  Right Ear: External ear normal.  Left Ear: External ear normal.  Mouth/Throat: Oropharynx  is clear and moist.  Eyes: Conjunctivae and EOM are normal. Pupils are equal, round, and reactive to light.  Neck: Normal range of motion. Neck supple. No thyromegaly present.  Cardiovascular: Normal rate, regular rhythm, normal heart sounds and intact distal pulses.   Pulmonary/Chest: Effort normal and breath sounds normal.  Abdominal: Soft. Bowel sounds are normal. She exhibits no mass. There is no tenderness.  Musculoskeletal: Normal range of motion.  Lymphadenopathy:    She has no cervical adenopathy.  Neurological: She is alert and oriented to person, place, and time.  Mild tremor of the outstretched hands  Skin: Skin is warm and dry. No rash noted.  Psychiatric: She has a normal  mood and affect. Her behavior is normal.          Assessment & Plan:   Hyperthyroidism. Presently TSH is elevated and medications down titrated Multiple somatic complaints. Patient appears to be quite anxious we'll give it followed low-dose sertraline 25 mg daily and recheck in four-week's Hypertension stable

## 2012-10-06 NOTE — Patient Instructions (Addendum)
Sertraline 1 tablet every morning  Return in 6 weeks for followup

## 2012-10-08 ENCOUNTER — Telehealth: Payer: Self-pay | Admitting: Internal Medicine

## 2012-10-08 NOTE — Telephone Encounter (Signed)
Patient Information:  Caller Name: Clydie Braun  Phone: [phone]  Patient: [name]  Gender: Female  DOB: May 26, 1922  Age: 79 Years  PCP: Eleonore Chiquito (Family Practice > 62yrs old)  Office Follow Up:  Does the office need to follow up with this patient?: No  Instructions For The Office: N/A   Symptoms  Reason For Call & Symptoms: Neighbor calling that pt took a Zoloft 25mg  10/07/12 and now she is very fidigity and feels nauseated.  Reviewed Health History In EMR: Yes  Reviewed Medications In EMR: Yes  Reviewed Allergies In EMR: N/A  Reviewed Surgeries / Procedures: Yes  Date of Onset of Symptoms: 10/07/2012  Guideline(s) Used:  Weakness (Generalized) and Fatigue  Diarrhea  Disposition Per Guideline:   Go to Office Now  Reason For Disposition Reached:   Constant abdominal pain lasting > 2 hours  Advice Given:  N/A  Patient Will Follow Care Advice: No.  Instructed to call back and pt has her first alert pendent on. Her daughter is out of town until 10/11/12.

## 2012-10-15 ENCOUNTER — Ambulatory Visit: Payer: Medicare Other | Admitting: Internal Medicine

## 2012-10-19 ENCOUNTER — Encounter: Payer: Self-pay | Admitting: Internal Medicine

## 2012-10-19 ENCOUNTER — Ambulatory Visit (INDEPENDENT_AMBULATORY_CARE_PROVIDER_SITE_OTHER): Payer: Medicare Other | Admitting: Internal Medicine

## 2012-10-19 VITALS — BP 142/70 | HR 70 | Temp 97.8°F | Resp 20 | Wt 116.0 lb

## 2012-10-19 DIAGNOSIS — Z23 Encounter for immunization: Secondary | ICD-10-CM

## 2012-10-19 DIAGNOSIS — K5900 Constipation, unspecified: Secondary | ICD-10-CM

## 2012-10-19 DIAGNOSIS — I10 Essential (primary) hypertension: Secondary | ICD-10-CM

## 2012-10-19 DIAGNOSIS — D510 Vitamin B12 deficiency anemia due to intrinsic factor deficiency: Secondary | ICD-10-CM

## 2012-10-19 MED ORDER — POLYETHYLENE GLYCOL 3350 17 GM/SCOOP PO POWD: ORAL | Status: DC

## 2012-10-19 NOTE — Progress Notes (Signed)
Subjective:    Patient ID: [name], female    DOB: 1922/07/12, 79 y.o.   MRN: [id number]  HPI  79 year old patient has chronic medical problems include hypertension and pernicious anemia. She has also been treated medically for hyperthyroidism and more recently TSH has been elevated. Her chief complaint today is constipation. She's had some associated anorexia and abdominal discomfort. One month ago she was given a followup low-dose sertraline which he has discontinued due 2 side effects  Past Medical History  Diagnosis Date  . ANEMIA, PERNICIOUS 09/10/2006  . CORONARY ARTERY DISEASE 11/07/2008    a. NSTEMI 2001 & STEMI 2008 managed medically. b. Takotsubo 06/2006. c. Cath 2009 -> med rx.  . CVA WITH LEFT HEMIPARESIS 07/22/2007    May, 2009, neurology decided aspirin and Plavix at that time.  . DERMATITIS 03/02/2007  . HYPERTENSION 07/15/2006  . MENINGIOMA 08/27/2006    Occipital meningioma, surgery, [facility], 2008 /  MRI, [place], June, 2011, no change in lesion, history believe the patient had a gamma knife treatment of a right occipital atypical meningioma  . OSTEOARTHRITIS 07/15/2006  . PULMONARY NODULE 01/14/2007    [facility], December, 2011  . Takotsubo syndrome 04/22/2008    MI, May, 2008, question takotsubo event  . THYROID NODULE 01/14/2007  . IBS (irritable bowel syndrome)   . DJD (degenerative joint disease)     L shoulder impingement  . Ejection fraction     EF 60%, echo, July, 2010, (  EF improved after tach a suitable event 2008)  . Subarachnoid hemorrhage following injury 2007    Subarachnoid hemorrhage secondary to a fall December, 2007  . Bradycardia     August, 2013  . Family history of anesthesia complication     daughter has a hard time waking up  . H/O hiatal hernia   . Abnormal CT of the chest     Stable patchy/nodular and ground-glass opacities in the right upper lobe, While grossly unchanged, low grade adenocarcinoma remains possible.    History   Social  History  . Marital Status: Widowed    Spouse Name: N/A    Number of Children: 4  . Years of Education: N/A   Occupational History  .     Social History Main Topics  . Smoking status: Never Smoker   . Smokeless tobacco: Never Used  . Alcohol Use: No  . Drug Use: No  . Sexual Activity: No   Other Topics Concern  . Not on file   Social History Narrative   Widowed. Lives in [place], [place] with daughter.     Past Surgical History  Procedure Laterality Date  . Cholecystectomy    . Abdominal hysterectomy    . Hip surgery    . Brain tumor excision      Meningioma; treated at [facility] in 2008  . Shoulder surgery    . Joint replacement      rt THR    Family History  Problem Relation Age of Onset  . Heart attack Brother 84    Allergies  Allergen Reactions  . Atorvastatin Other (See Comments)    Raises liver enzymes (takes pravastatin at home)  . Influenza Vaccines   . Phenytoin Other (See Comments)    Blood clots  . Promethazine Hcl Other (See Comments)    Severe sedation  . Sulfacetamide Sodium     REACTION: sulfa  . Sulfamethoxazole     REACTION: unspecified    Current Outpatient Prescriptions on File Prior to Visit  Medication Sig Dispense Refill  . clopidogrel (PLAVIX) 75 MG tablet Take 75 mg by mouth daily.      . cyanocobalamin (,VITAMIN B-12,) 1000 MCG/ML injection Inject 1 mL (1,000 mcg total) into the muscle every 30 (thirty) days.  3 mL  1  . isosorbide mononitrate (IMDUR) 30 MG 24 hr tablet Take 0.5 tablets (15 mg total) by mouth daily.  90 tablet  5  . methimazole (TAPAZOLE) 5 MG tablet 1 tab, 3 times a week.  40 tablet  1  . NEEDLE, DISP, 25 G (BD DISP NEEDLES) 25G X 5/8" MISC 1 each by Does not apply route every 30 (thirty) days.  50 each  1  . nitroGLYCERIN (NITROSTAT) 0.4 MG SL tablet Place 1 tablet (0.4 mg total) under the tongue every 5 (five) minutes as needed. For chest pain.  15 tablet  0  . pravastatin (PRAVACHOL) 20 MG tablet Take 20 mg by  mouth daily.       . Syringe, Disposable, 3 ML MISC 1 each by Does not apply route every 30 (thirty) days.  25 each  1  . traMADol (ULTRAM) 50 MG tablet Take 50 mg by mouth every 6 (six) hours as needed for pain. For pain.      Marland Kitchen triamcinolone ointment (KENALOG) 0.1 % Apply 1 application topically 2 (two) times daily as needed. For dry skin.       No current facility-administered medications on file prior to visit.    BP 142/70  Pulse 70  Temp(Src) 97.8 F (36.6 C) (Oral)  Resp 20  Wt 116 lb (52.617 kg)  BMI 19.3 kg/m2  SpO2 98%       Review of Systems  Constitutional: Negative.   HENT: Negative for hearing loss, congestion, sore throat, rhinorrhea, dental problem, sinus pressure and tinnitus.   Eyes: Negative for pain, discharge and visual disturbance.  Respiratory: Negative for cough and shortness of breath.   Cardiovascular: Negative for chest pain, palpitations and leg swelling.  Gastrointestinal: Positive for constipation. Negative for nausea, vomiting, abdominal pain, diarrhea, blood in stool and abdominal distention.  Genitourinary: Negative for dysuria, urgency, frequency, hematuria, flank pain, vaginal bleeding, vaginal discharge, difficulty urinating, vaginal pain and pelvic pain.  Musculoskeletal: Negative for joint swelling, arthralgias and gait problem.  Skin: Negative for rash.  Neurological: Negative for dizziness, syncope, speech difficulty, weakness, numbness and headaches.  Hematological: Negative for adenopathy.  Psychiatric/Behavioral: Negative for behavioral problems, dysphoric mood and agitation. The patient is not nervous/anxious.        Objective:   Physical Exam  Constitutional: She is oriented to person, place, and time. She appears well-developed and well-nourished.  HENT:  Head: Normocephalic.  Right Ear: External ear normal.  Left Ear: External ear normal.  Mouth/Throat: Oropharynx is clear and moist.  Eyes: Conjunctivae and EOM are normal.  Pupils are equal, round, and reactive to light.  Neck: Normal range of motion. Neck supple. No thyromegaly present.  Cardiovascular: Normal rate, regular rhythm, normal heart sounds and intact distal pulses.   Pulmonary/Chest: Effort normal and breath sounds normal.  Abdominal: Soft. Bowel sounds are normal. She exhibits no mass. There is no tenderness. There is no rebound and no guarding.  Musculoskeletal: Normal range of motion.  Lymphadenopathy:    She has no cervical adenopathy.  Neurological: She is alert and oriented to person, place, and time.  Skin: Skin is warm and dry. No rash noted.  Psychiatric: She has a normal mood and affect. Her behavior is  normal.          Assessment & Plan:   Hypertension well controlled Constipation. Will attempt to liberalize her fluid intake and fiber intake. She is on a fiber supplement. Will try to be more active physically. We'll continue her stools softer and add MiraLax when necessary Stool for occult blood x4 will be checked

## 2012-10-19 NOTE — Patient Instructions (Signed)
Drink as much fluid as you  can tolerate over the next few daysConstipation, Adult Constipation is when a person has fewer than 3 bowel movements a week; has difficulty having a bowel movement; or has stools that are dry, hard, or larger than normal. As people grow older, constipation is more common. If you try to fix constipation with medicines that make you have a bowel movement (laxatives), the problem may get worse. Long-term laxative use may cause the muscles of the colon to become weak. A low-fiber diet, not taking in enough fluids, and taking certain medicines may make constipation worse. CAUSES   Certain medicines, such as antidepressants, pain medicine, iron supplements, antacids, and water pills.   Certain diseases, such as diabetes, irritable bowel syndrome (IBS), thyroid disease, or depression.   Not drinking enough water.   Not eating enough fiber-rich foods.   Stress or travel.  Lack of physical activity or exercise.  Not going to the restroom when there is the urge to have a bowel movement.  Ignoring the urge to have a bowel movement.  Using laxatives too much. SYMPTOMS   Having fewer than 3 bowel movements a week.   Straining to have a bowel movement.   Having hard, dry, or larger than normal stools.   Feeling full or bloated.   Pain in the lower abdomen.  Not feeling relief after having a bowel movement. DIAGNOSIS  Your caregiver will take a medical history and perform a physical exam. Further testing may be done for severe constipation. Some tests may include:   A barium enema X-ray to examine your rectum, colon, and sometimes, your small intestine.  A sigmoidoscopy to examine your lower colon.  A colonoscopy to examine your entire colon. TREATMENT  Treatment will depend on the severity of your constipation and what is causing it. Some dietary treatments include drinking more fluids and eating more fiber-rich foods. Lifestyle treatments may include  regular exercise. If these diet and lifestyle recommendations do not help, your caregiver may recommend taking over-the-counter laxative medicines to help you have bowel movements. Prescription medicines may be prescribed if over-the-counter medicines do not work.  HOME CARE INSTRUCTIONS   Increase dietary fiber in your diet, such as fruits, vegetables, whole grains, and beans. Limit high-fat and processed sugars in your diet, such as [place] fries, hamburgers, cookies, candies, and soda.   A fiber supplement may be added to your diet if you cannot get enough fiber from foods.   Drink enough fluids to keep your urine clear or pale yellow.   Exercise regularly or as directed by your caregiver.   Go to the restroom when you have the urge to go. Do not hold it.  Only take medicines as directed by your caregiver. Do not take other medicines for constipation without talking to your caregiver first. SEEK IMMEDIATE MEDICAL CARE IF:   You have bright red blood in your stool.   Your constipation lasts for more than 4 days or gets worse.   You have abdominal or rectal pain.   You have thin, pencil-like stools.  You have unexplained weight loss. MAKE SURE YOU:   Understand these instructions.  Will watch your condition.  Will get help right away if you are not doing well or get worse. Document Released: 10/20/2003 Document Revised: 04/15/2011 Document Reviewed: 12/25/2010 [facility] Patient Information 2014 [place], [place]. High-Fiber Diet Fiber is found in fruits, vegetables, and grains. A high-fiber diet encourages the addition of more whole grains, legumes, fruits, and  vegetables in your diet. The recommended amount of fiber for adult males is 38 g per day. For adult females, it is 25 g per day. Pregnant and lactating women should get 28 g of fiber per day. If you have a digestive or bowel problem, ask your caregiver for advice before adding high-fiber foods to your diet. Eat a variety  of high-fiber foods instead of only a select few type of foods.  PURPOSE  To increase stool bulk.  To make bowel movements more regular to prevent constipation.  To lower cholesterol.  To prevent overeating. WHEN IS THIS DIET USED?  It may be used if you have constipation and hemorrhoids.  It may be used if you have uncomplicated diverticulosis (intestine condition) and irritable bowel syndrome.  It may be used if you need help with weight management.  It may be used if you want to add it to your diet as a protective measure against atherosclerosis, diabetes, and cancer. SOURCES OF FIBER  Whole-grain breads and cereals.  Fruits, such as apples, oranges, bananas, berries, prunes, and pears.  Vegetables, such as green peas, carrots, sweet potatoes, beets, broccoli, cabbage, spinach, and artichokes.  Legumes, such split peas, soy, lentils.  Almonds. FIBER CONTENT IN FOODS Starches and Grains / Dietary Fiber (g)  Cheerios, 1 cup / 3 g  Corn Flakes cereal, 1 cup / 0.7 g  Rice crispy treat cereal, 1 cup / 0.3 g  Instant oatmeal (cooked),  cup / 2 g  Frosted wheat cereal, 1 cup / 5.1 g  Brown, long-grain rice (cooked), 1 cup / 3.5 g  White, long-grain rice (cooked), 1 cup / 0.6 g  Enriched macaroni (cooked), 1 cup / 2.5 g Legumes / Dietary Fiber (g)  Baked beans (canned, plain, or vegetarian),  cup / 5.2 g  Kidney beans (canned),  cup / 6.8 g  Pinto beans (cooked),  cup / 5.5 g Breads and Crackers / Dietary Fiber (g)  Plain or honey graham crackers, 2 squares / 0.7 g  Saltine crackers, 3 squares / 0.3 g  Plain, salted pretzels, 10 pieces / 1.8 g  Whole-wheat bread, 1 slice / 1.9 g  White bread, 1 slice / 0.7 g  Raisin bread, 1 slice / 1.2 g  Plain bagel, 3 oz / 2 g  Flour tortilla, 1 oz / 0.9 g  Corn tortilla, 1 small / 1.5 g  Hamburger or hotdog bun, 1 small / 0.9 g Fruits / Dietary Fiber (g)  Apple with skin, 1 medium / 4.4 g  Sweetened  applesauce,  cup / 1.5 g  Banana,  medium / 1.5 g  Grapes, 10 grapes / 0.4 g  Orange, 1 small / 2.3 g  Raisin, 1.5 oz / 1.6 g  Melon, 1 cup / 1.4 g Vegetables / Dietary Fiber (g)  Green beans (canned),  cup / 1.3 g  Carrots (cooked),  cup / 2.3 g  Broccoli (cooked),  cup / 2.8 g  Peas (cooked),  cup / 4.4 g  Mashed potatoes,  cup / 1.6 g  Lettuce, 1 cup / 0.5 g  Corn (canned),  cup / 1.6 g  Tomato,  cup / 1.1 g Document Released: 01/21/2005 Document Revised: 07/23/2011 Document Reviewed: 04/25/2011 [facility] Patient Information 2014 [place], [place].

## 2012-10-23 ENCOUNTER — Ambulatory Visit: Payer: Medicare Other | Admitting: Endocrinology

## 2012-10-26 ENCOUNTER — Other Ambulatory Visit (INDEPENDENT_AMBULATORY_CARE_PROVIDER_SITE_OTHER): Payer: Medicare Other

## 2012-10-26 DIAGNOSIS — D649 Anemia, unspecified: Secondary | ICD-10-CM

## 2012-10-26 LAB — POC HEMOCCULT BLD/STL (HOME/3-CARD/SCREEN)
Card #1 Date: NEGATIVE
Card #2 Fecal Occult Blod, POC: NEGATIVE
Card #3 Date: NEGATIVE
Fecal Occult Blood, POC: NEGATIVE

## 2012-11-02 ENCOUNTER — Other Ambulatory Visit: Payer: Self-pay | Admitting: Internal Medicine

## 2012-11-06 ENCOUNTER — Other Ambulatory Visit: Payer: Self-pay | Admitting: Internal Medicine

## 2012-11-23 ENCOUNTER — Encounter: Payer: Self-pay | Admitting: Internal Medicine

## 2012-11-23 ENCOUNTER — Ambulatory Visit (INDEPENDENT_AMBULATORY_CARE_PROVIDER_SITE_OTHER): Payer: Medicare Other | Admitting: Internal Medicine

## 2012-11-23 VITALS — BP 110/60 | HR 60 | Temp 97.4°F | Resp 20 | Wt 118.0 lb

## 2012-11-23 DIAGNOSIS — I10 Essential (primary) hypertension: Secondary | ICD-10-CM

## 2012-11-23 DIAGNOSIS — M1990 Unspecified osteoarthritis, unspecified site: Secondary | ICD-10-CM

## 2012-11-23 DIAGNOSIS — I69959 Hemiplegia and hemiparesis following unspecified cerebrovascular disease affecting unspecified side: Secondary | ICD-10-CM

## 2012-11-23 NOTE — Progress Notes (Signed)
Subjective:    Patient ID: [name], female    DOB: 1922/08/16, 79 y.o.   MRN: [id number]  HPI  79 year-old patient who is seen today for followup. She has a history of hypertension CAD and pernicious anemia. She is doing quite well although having some arthritic complaints. She is accompanied by her daughter today. She has been stable. She does receive monthly B12 injections at home. She states that she has a history of allergy to flu vaccines and she declines. She did have a Pneumovax last month however  Past Medical History  Diagnosis Date  . ANEMIA, PERNICIOUS 09/10/2006  . CORONARY ARTERY DISEASE 11/07/2008    a. NSTEMI 2001 & STEMI 2008 managed medically. b. Takotsubo 06/2006. c. Cath 2009 -> med rx.  . CVA WITH LEFT HEMIPARESIS 07/22/2007    May, 2009, neurology decided aspirin and Plavix at that time.  . DERMATITIS 03/02/2007  . HYPERTENSION 07/15/2006  . MENINGIOMA 08/27/2006    Occipital meningioma, surgery, [facility], 2008 /  MRI, [place], June, 2011, no change in lesion, history believe the patient had a gamma knife treatment of a right occipital atypical meningioma  . OSTEOARTHRITIS 07/15/2006  . PULMONARY NODULE 01/14/2007    [facility], December, 2011  . Takotsubo syndrome 04/22/2008    MI, May, 2008, question takotsubo event  . THYROID NODULE 01/14/2007  . IBS (irritable bowel syndrome)   . DJD (degenerative joint disease)     L shoulder impingement  . Ejection fraction     EF 60%, echo, July, 2010, (  EF improved after tach a suitable event 2008)  . Subarachnoid hemorrhage following injury 2007    Subarachnoid hemorrhage secondary to a fall December, 2007  . Bradycardia     August, 2013  . Family history of anesthesia complication     daughter has a hard time waking up  . H/O hiatal hernia   . Abnormal CT of the chest     Stable patchy/nodular and ground-glass opacities in the right upper lobe, While grossly unchanged, low grade adenocarcinoma remains possible.     History   Social History  . Marital Status: Widowed    Spouse Name: N/A    Number of Children: 4  . Years of Education: N/A   Occupational History  .     Social History Main Topics  . Smoking status: Never Smoker   . Smokeless tobacco: Never Used  . Alcohol Use: No  . Drug Use: No  . Sexual Activity: No   Other Topics Concern  . Not on file   Social History Narrative   Widowed. Lives in [place], [place] with daughter.     Past Surgical History  Procedure Laterality Date  . Cholecystectomy    . Abdominal hysterectomy    . Hip surgery    . Brain tumor excision      Meningioma; treated at [facility] in 2008  . Shoulder surgery    . Joint replacement      rt THR    Family History  Problem Relation Age of Onset  . Heart attack Brother 84    Allergies  Allergen Reactions  . Atorvastatin Other (See Comments)    Raises liver enzymes (takes pravastatin at home)  . Influenza Vaccines   . Phenytoin Other (See Comments)    Blood clots  . Promethazine Hcl Other (See Comments)    Severe sedation  . Sulfacetamide Sodium     REACTION: sulfa  . Sulfamethoxazole     REACTION:  unspecified    Current Outpatient Prescriptions on File Prior to Visit  Medication Sig Dispense Refill  . clopidogrel (PLAVIX) 75 MG tablet TAKE 1 TABLET DAILY  90 tablet  1  . cyanocobalamin (,VITAMIN B-12,) 1000 MCG/ML injection Inject 1 mL (1,000 mcg total) into the muscle every 30 (thirty) days.  3 mL  1  . isosorbide mononitrate (IMDUR) 30 MG 24 hr tablet Take 0.5 tablets (15 mg total) by mouth daily.  90 tablet  5  . methimazole (TAPAZOLE) 5 MG tablet 1 tab, 3 times a week.  40 tablet  1  . NEEDLE, DISP, 25 G (BD DISP NEEDLES) 25G X 5/8" MISC 1 each by Does not apply route every 30 (thirty) days.  50 each  1  . nitroGLYCERIN (NITROSTAT) 0.4 MG SL tablet Place 1 tablet (0.4 mg total) under the tongue every 5 (five) minutes as needed. For chest pain.  15 tablet  0  . polyethylene glycol powder  (GLYCOLAX/MIRALAX) powder Take 17 g by mouth daily as needed for constipation  3350 g  1  . pravastatin (PRAVACHOL) 20 MG tablet TAKE 1 TABLET EVERY EVENING  90 tablet  3  . Syringe, Disposable, 3 ML MISC 1 each by Does not apply route every 30 (thirty) days.  25 each  1  . traMADol (ULTRAM) 50 MG tablet Take 50 mg by mouth every 6 (six) hours as needed for pain. For pain.      Marland Kitchen triamcinolone ointment (KENALOG) 0.1 % Apply 1 application topically 2 (two) times daily as needed. For dry skin.       No current facility-administered medications on file prior to visit.    BP 110/60  Pulse 60  Temp(Src) 97.4 F (36.3 C) (Oral)  Resp 20  Wt 118 lb (53.524 kg)  BMI 19.64 kg/m2  SpO2 96%       Review of Systems  Constitutional: Negative.   HENT: Negative for congestion, dental problem, hearing loss, rhinorrhea, sinus pressure, sore throat and tinnitus.   Eyes: Negative for pain, discharge and visual disturbance.  Respiratory: Negative for cough and shortness of breath.   Cardiovascular: Negative for chest pain, palpitations and leg swelling.  Gastrointestinal: Negative for nausea, vomiting, abdominal pain, diarrhea, constipation, blood in stool and abdominal distention.  Genitourinary: Negative for dysuria, urgency, frequency, hematuria, flank pain, vaginal bleeding, vaginal discharge, difficulty urinating, vaginal pain and pelvic pain.  Musculoskeletal: Positive for arthralgias and gait problem. Negative for joint swelling.  Skin: Negative for rash.  Neurological: Negative for dizziness, syncope, speech difficulty, weakness, numbness and headaches.  Hematological: Negative for adenopathy.  Psychiatric/Behavioral: Negative for behavioral problems, dysphoric mood and agitation. The patient is not nervous/anxious.        Objective:   Physical Exam  Constitutional: She is oriented to person, place, and time. She appears well-developed and well-nourished.  HENT:  Head: Normocephalic.   Right Ear: External ear normal.  Left Ear: External ear normal.  Mouth/Throat: Oropharynx is clear and moist.  Eyes: Conjunctivae and EOM are normal. Pupils are equal, round, and reactive to light.  Neck: Normal range of motion. Neck supple. No thyromegaly present.  Cardiovascular: Normal rate, regular rhythm, normal heart sounds and intact distal pulses.   Pulmonary/Chest: Effort normal and breath sounds normal.  Abdominal: Soft. Bowel sounds are normal. She exhibits no mass. There is no tenderness.  Musculoskeletal: Normal range of motion.  Lymphadenopathy:    She has no cervical adenopathy.  Neurological: She is alert and oriented to  person, place, and time.  Skin: Skin is warm and dry. No rash noted.  Psychiatric: She has a normal mood and affect. Her behavior is normal.          Assessment & Plan:   Hypertension well controlled dyslipidemia coronary artery disease osteoarthritis.  Medications updated Recheck 6 months with laboratory updated at that time

## 2012-11-23 NOTE — Patient Instructions (Signed)
Limit your sodium (Salt) intake Take a calcium supplement, plus 800-1200 units of vitamin D  Return in 6 months for follow-up  

## 2012-12-03 ENCOUNTER — Other Ambulatory Visit: Payer: Self-pay | Admitting: *Deleted

## 2012-12-03 ENCOUNTER — Other Ambulatory Visit: Payer: Self-pay | Admitting: Endocrinology

## 2012-12-03 NOTE — Telephone Encounter (Signed)
Rx already filled. 

## 2012-12-07 ENCOUNTER — Other Ambulatory Visit: Payer: Self-pay | Admitting: *Deleted

## 2012-12-07 ENCOUNTER — Encounter: Payer: Self-pay | Admitting: Internal Medicine

## 2012-12-07 ENCOUNTER — Telehealth: Payer: Self-pay | Admitting: Endocrinology

## 2012-12-07 MED ORDER — METHIMAZOLE 5 MG PO TABS: ORAL_TABLET | ORAL | Status: DC

## 2012-12-24 ENCOUNTER — Encounter: Payer: Self-pay | Admitting: Endocrinology

## 2012-12-24 ENCOUNTER — Ambulatory Visit (INDEPENDENT_AMBULATORY_CARE_PROVIDER_SITE_OTHER): Payer: Medicare Other | Admitting: Endocrinology

## 2012-12-24 VITALS — BP 140/70 | HR 72 | Temp 97.4°F | Resp 16 | Ht 63.5 in | Wt 119.1 lb

## 2012-12-24 DIAGNOSIS — E0590 Thyrotoxicosis, unspecified without thyrotoxic crisis or storm: Secondary | ICD-10-CM

## 2012-12-24 LAB — TSH: TSH: 1.16 u[IU]/mL (ref 0.35–5.50)

## 2012-12-24 NOTE — Progress Notes (Signed)
Subjective:    Patient ID: [name], female    DOB: November 03, 1922, 79 y.o.   MRN: [id number]  HPI The state of at least three ongoing medical problems is addressed today, with interval history of each noted here: Pt was noted in the hospital in sept, 2013, to have hyperthyroidism.  She was started on tapazole.  Since on the tapazole, she feels no different.   Pt's dtr says pt takes tapazole only 5 mg qd (1/2 of 10 mg).  She was also noted to have a nodule, but w/u was felt not to be worthwhile, due to her advanced age and health problems.    Tremor is improved.   Past Medical History  Diagnosis Date  . ANEMIA, PERNICIOUS 09/10/2006  . CORONARY ARTERY DISEASE 11/07/2008    a. NSTEMI 2001 & STEMI 2008 managed medically. b. Takotsubo 06/2006. c. Cath 2009 -> med rx.  . CVA WITH LEFT HEMIPARESIS 07/22/2007    May, 2009, neurology decided aspirin and Plavix at that time.  . DERMATITIS 03/02/2007  . HYPERTENSION 07/15/2006  . MENINGIOMA 08/27/2006    Occipital meningioma, surgery, [facility], 2008 /  MRI, [place], June, 2011, no change in lesion, history believe the patient had a gamma knife treatment of a right occipital atypical meningioma  . OSTEOARTHRITIS 07/15/2006  . PULMONARY NODULE 01/14/2007    [facility], December, 2011  . Takotsubo syndrome 04/22/2008    MI, May, 2008, question takotsubo event  . THYROID NODULE 01/14/2007  . IBS (irritable bowel syndrome)   . DJD (degenerative joint disease)     L shoulder impingement  . Ejection fraction     EF 60%, echo, July, 2010, (  EF improved after tach a suitable event 2008)  . Subarachnoid hemorrhage following injury 2007    Subarachnoid hemorrhage secondary to a fall December, 2007  . Bradycardia     August, 2013  . Family history of anesthesia complication     daughter has a hard time waking up  . H/O hiatal hernia   . Abnormal CT of the chest     Stable patchy/nodular and ground-glass opacities in the right upper lobe, While grossly  unchanged, low grade adenocarcinoma remains possible.    Past Surgical History  Procedure Laterality Date  . Cholecystectomy    . Abdominal hysterectomy    . Hip surgery    . Brain tumor excision      Meningioma; treated at [facility] in 2008  . Shoulder surgery    . Joint replacement      rt THR    History   Social History  . Marital Status: Widowed    Spouse Name: N/A    Number of Children: 4  . Years of Education: N/A   Occupational History  .     Social History Main Topics  . Smoking status: Never Smoker   . Smokeless tobacco: Never Used  . Alcohol Use: No  . Drug Use: No  . Sexual Activity: No   Other Topics Concern  . Not on file   Social History Narrative   Widowed. Lives in [place], [place] with daughter.     Current Outpatient Prescriptions on File Prior to Visit  Medication Sig Dispense Refill  . clopidogrel (PLAVIX) 75 MG tablet TAKE 1 TABLET DAILY  90 tablet  1  . cyanocobalamin (,VITAMIN B-12,) 1000 MCG/ML injection Inject 1 mL (1,000 mcg total) into the muscle every 30 (thirty) days.  3 mL  1  . isosorbide mononitrate (IMDUR) 30  MG 24 hr tablet Take 0.5 tablets (15 mg total) by mouth daily.  90 tablet  5  . methimazole (TAPAZOLE) 5 MG tablet 1 tab, 3 times a week.  40 tablet  1  . NEEDLE, DISP, 25 G (BD DISP NEEDLES) 25G X 5/8" MISC 1 each by Does not apply route every 30 (thirty) days.  50 each  1  . nitroGLYCERIN (NITROSTAT) 0.4 MG SL tablet Place 1 tablet (0.4 mg total) under the tongue every 5 (five) minutes as needed. For chest pain.  15 tablet  0  . polyethylene glycol powder (GLYCOLAX/MIRALAX) powder Take 17 g by mouth daily as needed for constipation  3350 g  1  . pravastatin (PRAVACHOL) 20 MG tablet TAKE 1 TABLET EVERY EVENING  90 tablet  3  . Syringe, Disposable, 3 ML MISC 1 each by Does not apply route every 30 (thirty) days.  25 each  1  . traMADol (ULTRAM) 50 MG tablet Take 50 mg by mouth every 6 (six) hours as needed for pain. For pain.       Marland Kitchen triamcinolone ointment (KENALOG) 0.1 % Apply 1 application topically 2 (two) times daily as needed. For dry skin.       No current facility-administered medications on file prior to visit.    Allergies  Allergen Reactions  . Atorvastatin Other (See Comments)    Raises liver enzymes (takes pravastatin at home)  . Influenza Vaccines   . Phenytoin Other (See Comments)    Blood clots  . Promethazine Hcl Other (See Comments)    Severe sedation  . Sulfacetamide Sodium     REACTION: sulfa  . Sulfamethoxazole     REACTION: unspecified    Family History  Problem Relation Age of Onset  . Heart attack Brother 84    BP 140/70  Pulse 72  Temp(Src) 97.4 F (36.3 C) (Oral)  Resp 16  Ht 5' 3.5" (1.613 m)  Wt 119 lb 1.6 oz (54.023 kg)  BMI 20.76 kg/m2  Review of Systems Denies fever and weight change.    Objective:   Physical Exam VITAL SIGNS:  See vs page GENERAL: no distress Skin: not diaphoretic Neuro: no tremor.    Lab Results  Component Value Date   TSH 1.16 12/24/2012      Assessment & Plan:  Hyperthyroidism, well-controlled. Tremor.  Not thyroid-related, but it is improved. HTN: possible situational component Thyroid nodule: w/u not indicated.

## 2012-12-24 NOTE — Patient Instructions (Signed)
Please stop the methimazole x 1 week, then resume at 5 mg 3x a week (mon, wed, and fri). if ever you have fever while taking methimazole, stop it and call us, because of the risk of a rare side-effect. Please come back for a follow-up appointment in 3 months.

## 2013-01-05 ENCOUNTER — Ambulatory Visit (INDEPENDENT_AMBULATORY_CARE_PROVIDER_SITE_OTHER): Payer: Medicare Other | Admitting: Internal Medicine

## 2013-01-05 ENCOUNTER — Encounter: Payer: Self-pay | Admitting: Internal Medicine

## 2013-01-05 VITALS — BP 140/80 | HR 63 | Temp 97.3°F | Resp 18 | Wt 117.0 lb

## 2013-01-05 DIAGNOSIS — I10 Essential (primary) hypertension: Secondary | ICD-10-CM

## 2013-01-05 DIAGNOSIS — I2510 Atherosclerotic heart disease of native coronary artery without angina pectoris: Secondary | ICD-10-CM

## 2013-01-05 DIAGNOSIS — I69959 Hemiplegia and hemiparesis following unspecified cerebrovascular disease affecting unspecified side: Secondary | ICD-10-CM

## 2013-01-05 DIAGNOSIS — E0590 Thyrotoxicosis, unspecified without thyrotoxic crisis or storm: Secondary | ICD-10-CM

## 2013-01-05 MED ORDER — SYRINGE (DISPOSABLE) 3 ML MISC: 1.0000 | Status: DC

## 2013-01-05 MED ORDER — ESTROGENS, CONJUGATED 0.625 MG/GM VA CREA: 0.5000 g | TOPICAL_CREAM | VAGINAL | Status: DC | PRN

## 2013-01-05 MED ORDER — "NEEDLE (DISP) 25G X 5/8"" MISC": 1.0000 | Status: DC

## 2013-01-05 MED ORDER — CYANOCOBALAMIN 1000 MCG/ML IJ SOLN: 1000.0000 ug | INTRAMUSCULAR | Status: DC

## 2013-01-05 NOTE — Progress Notes (Deleted)
Pre-visit discussion using our clinic review tool. No additional management support is needed unless otherwise documented below in the visit note.  

## 2013-01-06 NOTE — Patient Instructions (Signed)
Call or return to clinic prn if these symptoms worsen or fail to improve as anticipated.

## 2013-01-06 NOTE — Progress Notes (Signed)
Subjective:    Patient ID: [name], female    DOB: December 18, 1922, 79 y.o.   MRN: [id number]  HPI Pre-visit discussion using our clinic review tool. No additional management support is needed unless otherwise documented below in the visit note.  79 year-old patient who is seen today in followup. She has a history of coronary artery and cerebrovascular disease. She is a remote right brain stroke causing left arm paresis  in the past. Complaints today include some numbness and tingling involving the left arm without weakness. Remains on Plavix for stroke prevention. She has multiple somatic complaints including chest and back pain that have been fairly chronic. She does use tramadol for pain. No ischemic chest pain.  Past Medical History  Diagnosis Date  . ANEMIA, PERNICIOUS 09/10/2006  . CORONARY ARTERY DISEASE 11/07/2008    a. NSTEMI 2001 & STEMI 2008 managed medically. b. Takotsubo 06/2006. c. Cath 2009 -> med rx.  . CVA WITH LEFT HEMIPARESIS 07/22/2007    May, 2009, neurology decided aspirin and Plavix at that time.  . DERMATITIS 03/02/2007  . HYPERTENSION 07/15/2006  . MENINGIOMA 08/27/2006    Occipital meningioma, surgery, [facility], 2008 /  MRI, [facility], June, 2011, no change in lesion, history believe the patient had a gamma knife treatment of a right occipital atypical meningioma  . OSTEOARTHRITIS 07/15/2006  . PULMONARY NODULE 01/14/2007    [facility], December, 2011  . Takotsubo syndrome 04/22/2008    MI, May, 2008, question takotsubo event  . THYROID NODULE 01/14/2007  . IBS (irritable bowel syndrome)   . DJD (degenerative joint disease)     L shoulder impingement  . Ejection fraction     EF 60%, echo, July, 2010, (  EF improved after tach a suitable event 2008)  . Subarachnoid hemorrhage following injury 2007    Subarachnoid hemorrhage secondary to a fall December, 2007  . Bradycardia     August, 2013  . Family history of anesthesia complication     daughter has a hard time waking  up  . H/O hiatal hernia   . Abnormal CT of the chest     Stable patchy/nodular and ground-glass opacities in the right upper lobe, While grossly unchanged, low grade adenocarcinoma remains possible.    History   Social History  . Marital Status: Widowed    Spouse Name: N/A    Number of Children: 4  . Years of Education: N/A   Occupational History  .     Social History Main Topics  . Smoking status: Never Smoker   . Smokeless tobacco: Never Used  . Alcohol Use: No  . Drug Use: No  . Sexual Activity: No   Other Topics Concern  . Not on file   Social History Narrative   Widowed. Lives in [place], [place] with daughter.     Past Surgical History  Procedure Laterality Date  . Cholecystectomy    . Abdominal hysterectomy    . Hip surgery    . Brain tumor excision      Meningioma; treated at [facility] in 2008  . Shoulder surgery    . Joint replacement      rt THR    Family History  Problem Relation Age of Onset  . Heart attack Brother 84    Allergies  Allergen Reactions  . Atorvastatin Other (See Comments)    Raises liver enzymes (takes pravastatin at home)  . Influenza Vaccines   . Phenytoin Other (See Comments)    Blood clots  .  Promethazine Hcl Other (See Comments)    Severe sedation  . Sulfacetamide Sodium     REACTION: sulfa  . Sulfamethoxazole     REACTION: unspecified    Current Outpatient Prescriptions on File Prior to Visit  Medication Sig Dispense Refill  . clopidogrel (PLAVIX) 75 MG tablet TAKE 1 TABLET DAILY  90 tablet  1  . isosorbide mononitrate (IMDUR) 30 MG 24 hr tablet Take 0.5 tablets (15 mg total) by mouth daily.  90 tablet  5  . methimazole (TAPAZOLE) 5 MG tablet 1 tab, 3 times a week.  40 tablet  1  . nitroGLYCERIN (NITROSTAT) 0.4 MG SL tablet Place 1 tablet (0.4 mg total) under the tongue every 5 (five) minutes as needed. For chest pain.  15 tablet  0  . polyethylene glycol powder (GLYCOLAX/MIRALAX) powder Take 17 g by mouth daily as  needed for constipation  3350 g  1  . pravastatin (PRAVACHOL) 20 MG tablet TAKE 1 TABLET EVERY EVENING  90 tablet  3  . traMADol (ULTRAM) 50 MG tablet Take 50 mg by mouth every 6 (six) hours as needed for pain. For pain.      Marland Kitchen triamcinolone ointment (KENALOG) 0.1 % Apply 1 application topically 2 (two) times daily as needed. For dry skin.       No current facility-administered medications on file prior to visit.    BP 140/80  Pulse 63  Temp(Src) 97.3 F (36.3 C) (Oral)  Resp 18  Wt 117 lb (53.071 kg)  SpO2 98%       Review of Systems  Cardiovascular: Positive for chest pain.  Musculoskeletal: Positive for arthralgias, back pain and myalgias.  Neurological: Positive for weakness and numbness.       Objective:   Physical Exam  Constitutional: She is oriented to person, place, and time. She appears well-developed and well-nourished. No distress.  Appears clinically well. Blood pressure 130/80  HENT:  Head: Normocephalic.  Right Ear: External ear normal.  Left Ear: External ear normal.  Mouth/Throat: Oropharynx is clear and moist.  Eyes: Conjunctivae and EOM are normal. Pupils are equal, round, and reactive to light.  Neck: Normal range of motion. Neck supple. No thyromegaly present.  Cardiovascular: Normal rate, regular rhythm, normal heart sounds and intact distal pulses.   Pulmonary/Chest: Effort normal and breath sounds normal.  Abdominal: Soft. Bowel sounds are normal. She exhibits no mass. There is no tenderness.  Musculoskeletal: Normal range of motion.  Lymphadenopathy:    She has no cervical adenopathy.  Neurological: She is alert and oriented to person, place, and time.  No drift No motor weakness No obvious sensory changes to soft touch and monofilament testing involving the arms  Skin: Skin is warm and dry. No rash noted.  Psychiatric: She has a normal mood and affect. Her behavior is normal.          Assessment & Plan:   Left arm tingling.  Unclear  etiology certainly the patient could have a small lacunar sensory stroke. Options were discussed with patient and daughter and it was elected to observe at this time. The patient states that she does have a followup brain MRI scheduled in 2 months apparently followup meningioma resection Hypertension stable dyslipidemia. Continue pravastatin History of hyperthyroidism.

## 2013-01-08 ENCOUNTER — Telehealth: Payer: Self-pay | Admitting: Internal Medicine

## 2013-01-08 ENCOUNTER — Other Ambulatory Visit: Payer: Self-pay | Admitting: *Deleted

## 2013-01-08 MED ORDER — ESTROGENS, CONJUGATED 0.625 MG/GM VA CREA: TOPICAL_CREAM | VAGINAL | Status: DC

## 2013-01-08 NOTE — Telephone Encounter (Signed)
Spoke to pt's daughter [name] told her clarification of orders were faxed back to [facility] for B 12 Injection supplies and premarin cream. [name] verbalized understanding

## 2013-01-08 NOTE — Telephone Encounter (Signed)
Pt's daughter calling to state she just received a call from [facility] stating they have been unable to reach PCP regarding one of her scripts in order to refill (daughter unsure of what script).  [facility] need to hear back by 01/12/13 or the order will be cancelled.  Please call back with reference # [id number].

## 2013-01-16 ENCOUNTER — Observation Stay (HOSPITAL_COMMUNITY)
Admission: EM | Admit: 2013-01-16 | Discharge: 2013-01-17 | Disposition: A | Discharge status: Home / Self Care | Payer: Medicare Other | Attending: Internal Medicine | Admitting: Internal Medicine

## 2013-01-16 ENCOUNTER — Emergency Department (HOSPITAL_COMMUNITY): Payer: Medicare Other

## 2013-01-16 ENCOUNTER — Encounter (HOSPITAL_COMMUNITY): Payer: Self-pay | Admitting: Emergency Medicine

## 2013-01-16 DIAGNOSIS — Z87828 Personal history of other (healed) physical injury and trauma: Secondary | ICD-10-CM | POA: Insufficient documentation

## 2013-01-16 DIAGNOSIS — Z8709 Personal history of other diseases of the respiratory system: Secondary | ICD-10-CM | POA: Insufficient documentation

## 2013-01-16 DIAGNOSIS — R5381 Other malaise: Secondary | ICD-10-CM | POA: Insufficient documentation

## 2013-01-16 DIAGNOSIS — E0590 Thyrotoxicosis, unspecified without thyrotoxic crisis or storm: Secondary | ICD-10-CM | POA: Insufficient documentation

## 2013-01-16 DIAGNOSIS — R0789 Other chest pain: Principal | ICD-10-CM | POA: Insufficient documentation

## 2013-01-16 DIAGNOSIS — R059 Cough, unspecified: Secondary | ICD-10-CM | POA: Insufficient documentation

## 2013-01-16 DIAGNOSIS — Z7902 Long term (current) use of antithrombotics/antiplatelets: Secondary | ICD-10-CM | POA: Insufficient documentation

## 2013-01-16 DIAGNOSIS — Z882 Allergy status to sulfonamides status: Secondary | ICD-10-CM | POA: Insufficient documentation

## 2013-01-16 DIAGNOSIS — R0602 Shortness of breath: Secondary | ICD-10-CM | POA: Insufficient documentation

## 2013-01-16 DIAGNOSIS — Z887 Allergy status to serum and vaccine status: Secondary | ICD-10-CM | POA: Insufficient documentation

## 2013-01-16 DIAGNOSIS — R079 Chest pain, unspecified: Secondary | ICD-10-CM

## 2013-01-16 DIAGNOSIS — Z79899 Other long term (current) drug therapy: Secondary | ICD-10-CM | POA: Insufficient documentation

## 2013-01-16 DIAGNOSIS — R509 Fever, unspecified: Secondary | ICD-10-CM | POA: Insufficient documentation

## 2013-01-16 DIAGNOSIS — Z888 Allergy status to other drugs, medicaments and biological substances status: Secondary | ICD-10-CM | POA: Insufficient documentation

## 2013-01-16 DIAGNOSIS — R05 Cough: Secondary | ICD-10-CM | POA: Insufficient documentation

## 2013-01-16 DIAGNOSIS — R209 Unspecified disturbances of skin sensation: Secondary | ICD-10-CM | POA: Insufficient documentation

## 2013-01-16 DIAGNOSIS — Z8673 Personal history of transient ischemic attack (TIA), and cerebral infarction without residual deficits: Secondary | ICD-10-CM | POA: Insufficient documentation

## 2013-01-16 DIAGNOSIS — I10 Essential (primary) hypertension: Secondary | ICD-10-CM | POA: Insufficient documentation

## 2013-01-16 DIAGNOSIS — R001 Bradycardia, unspecified: Secondary | ICD-10-CM

## 2013-01-16 DIAGNOSIS — Z872 Personal history of diseases of the skin and subcutaneous tissue: Secondary | ICD-10-CM | POA: Insufficient documentation

## 2013-01-16 DIAGNOSIS — I2510 Atherosclerotic heart disease of native coronary artery without angina pectoris: Secondary | ICD-10-CM | POA: Insufficient documentation

## 2013-01-16 DIAGNOSIS — M1990 Unspecified osteoarthritis, unspecified site: Secondary | ICD-10-CM | POA: Insufficient documentation

## 2013-01-16 DIAGNOSIS — I498 Other specified cardiac arrhythmias: Secondary | ICD-10-CM | POA: Insufficient documentation

## 2013-01-16 DIAGNOSIS — D510 Vitamin B12 deficiency anemia due to intrinsic factor deficiency: Secondary | ICD-10-CM | POA: Insufficient documentation

## 2013-01-16 DIAGNOSIS — J029 Acute pharyngitis, unspecified: Secondary | ICD-10-CM | POA: Insufficient documentation

## 2013-01-16 DIAGNOSIS — Z86011 Personal history of benign neoplasm of the brain: Secondary | ICD-10-CM | POA: Insufficient documentation

## 2013-01-16 DIAGNOSIS — E785 Hyperlipidemia, unspecified: Secondary | ICD-10-CM | POA: Diagnosis present

## 2013-01-16 DIAGNOSIS — I5181 Takotsubo syndrome: Secondary | ICD-10-CM | POA: Insufficient documentation

## 2013-01-16 LAB — CBC
HCT: 35.6 % — ABNORMAL LOW (ref 36.0–46.0)
HCT: 33.7 % — ABNORMAL LOW (ref 36.0–46.0)
Hemoglobin: 11.8 g/dL — ABNORMAL LOW (ref 12.0–15.0)
Hemoglobin: 11.1 g/dL — ABNORMAL LOW (ref 12.0–15.0)
MCH: 30.3 pg (ref 26.0–34.0)
MCH: 30.1 pg (ref 26.0–34.0)
MCHC: 33.1 g/dL (ref 30.0–36.0)
MCHC: 32.9 g/dL (ref 30.0–36.0)
MCV: 91.5 fL (ref 78.0–100.0)
MCV: 91.3 fL (ref 78.0–100.0)
Platelets: 208 10*3/uL (ref 150–400)
RBC: 3.89 MIL/uL (ref 3.87–5.11)
RDW: 14.1 % (ref 11.5–15.5)
RDW: 14 % (ref 11.5–15.5)

## 2013-01-16 LAB — BASIC METABOLIC PANEL
BUN: 19 mg/dL (ref 6–23)
CO2: 26 mEq/L (ref 19–32)
Creatinine, Ser: 0.87 mg/dL (ref 0.50–1.10)
GFR calc Af Amer: 67 mL/min — ABNORMAL LOW (ref >90)
GFR calc non Af Amer: 58 mL/min — ABNORMAL LOW (ref >90)

## 2013-01-16 LAB — CREATININE, SERUM
Creatinine, Ser: 0.85 mg/dL (ref 0.50–1.10)
GFR calc non Af Amer: 59 mL/min — ABNORMAL LOW (ref >90)

## 2013-01-16 LAB — TROPONIN I: Troponin I: <0.3 ng/mL (ref <0.30)

## 2013-01-16 MED ORDER — METHIMAZOLE 5 MG PO TABS: 5.0000 mg | ORAL_TABLET | ORAL | Status: DC

## 2013-01-16 MED ORDER — ISOSORBIDE MONONITRATE 15 MG HALF TABLET
15.0000 mg | ORAL_TABLET | Freq: Every day | ORAL | Status: DC
  Administered 2013-01-17: 15 mg via ORAL
  Filled 2013-01-16: qty 1

## 2013-01-16 MED ORDER — PRAVASTATIN SODIUM 20 MG PO TABS
20.0000 mg | ORAL_TABLET | Freq: Every day | ORAL | Status: DC
  Administered 2013-01-16: 20 mg via ORAL
  Filled 2013-01-16 (×2): qty 1

## 2013-01-16 MED ORDER — TRAMADOL HCL 50 MG PO TABS: 50.0000 mg | ORAL_TABLET | Freq: Four times a day (QID) | ORAL | Status: DC | PRN

## 2013-01-16 MED ORDER — ONDANSETRON HCL 4 MG/2ML IJ SOLN
4.0000 mg | Freq: Four times a day (QID) | INTRAMUSCULAR | Status: DC | PRN
Start: 2013-01-16 — End: 2013-01-17

## 2013-01-16 MED ORDER — ASPIRIN 81 MG PO CHEW
324.0000 mg | CHEWABLE_TABLET | Freq: Once | ORAL | Status: AC
  Administered 2013-01-16: 324 mg via ORAL
  Filled 2013-01-16: qty 4

## 2013-01-16 MED ORDER — CLOPIDOGREL BISULFATE 75 MG PO TABS
75.0000 mg | ORAL_TABLET | Freq: Every day | ORAL | Status: DC
  Administered 2013-01-17: 75 mg via ORAL
  Filled 2013-01-16: qty 1

## 2013-01-16 MED ORDER — HEPARIN SODIUM (PORCINE) 5000 UNIT/ML IJ SOLN
5000.0000 [IU] | Freq: Three times a day (TID) | INTRAMUSCULAR | Status: DC
  Administered 2013-01-16 – 2013-01-17 (×2): 5000 [IU] via SUBCUTANEOUS
  Filled 2013-01-16 (×5): qty 1

## 2013-01-16 MED ORDER — ONDANSETRON HCL 4 MG PO TABS: 4.0000 mg | ORAL_TABLET | Freq: Four times a day (QID) | ORAL | Status: DC | PRN

## 2013-01-16 MED ORDER — MORPHINE SULFATE 2 MG/ML IJ SOLN: 2.0000 mg | INTRAMUSCULAR | Status: DC | PRN

## 2013-01-16 MED ORDER — CYCLOBENZAPRINE HCL 5 MG PO TABS
5.0000 mg | ORAL_TABLET | Freq: Three times a day (TID) | ORAL | Status: DC | PRN
  Filled 2013-01-16: qty 1

## 2013-01-16 MED ORDER — ACETAMINOPHEN 650 MG RE SUPP: 650.0000 mg | Freq: Four times a day (QID) | RECTAL | Status: DC | PRN

## 2013-01-16 MED ORDER — NON FORMULARY: 20.0000 mg | Freq: Every day | Status: DC

## 2013-01-16 MED ORDER — NITROGLYCERIN 0.4 MG SL SUBL: 0.4000 mg | SUBLINGUAL_TABLET | SUBLINGUAL | Status: DC | PRN

## 2013-01-16 MED ORDER — ACETAMINOPHEN 325 MG PO TABS: 650.0000 mg | ORAL_TABLET | Freq: Four times a day (QID) | ORAL | Status: DC | PRN

## 2013-01-16 MED ORDER — SODIUM CHLORIDE 0.9 % IJ SOLN
3.0000 mL | Freq: Two times a day (BID) | INTRAMUSCULAR | Status: DC
  Administered 2013-01-16: 3 mL via INTRAVENOUS

## 2013-01-16 NOTE — H&P (Signed)
[facility] History and Physical  [name] ZOX:[id number] DOB: 03/24/1922 DOA: 01/16/2013  Referring physician: Emergency Department PCP: Rogelia Boga, MD  Specialists:   Chief Complaint: Chest pain  HPI: [name] is a 79 y.o. female  With a hx of known CAD with previous recommendations for medical management, HTN, HLD, hx CVA who presents with chest pain with chest numbness. Cardiac enzymes were neg x 1 in the ED. EKG was unremarkable. Pt reports sudden onset B chest numbness with radiation to B arms through the fingers. Resolved in the ED after asa. Presently asymptomatic. Given cardiac risk factors, Hospitalists were consulted for admission.  Review of Systems:  Per above, the remainder of the 10pt ros reviewed and are neg  Past Medical History  Diagnosis Date  . ANEMIA, PERNICIOUS 09/10/2006  . CORONARY ARTERY DISEASE 11/07/2008    a. NSTEMI 2001 & STEMI 2008 managed medically. b. Takotsubo 06/2006. c. Cath 2009 -> med rx.  . CVA WITH LEFT HEMIPARESIS 07/22/2007    May, 2009, neurology decided aspirin and Plavix at that time.  . DERMATITIS 03/02/2007  . HYPERTENSION 07/15/2006  . MENINGIOMA 08/27/2006    Occipital meningioma, surgery, [facility], 2008 /  MRI, [facility], June, 2011, no change in lesion, history believe the patient had a gamma knife treatment of a right occipital atypical meningioma  . OSTEOARTHRITIS 07/15/2006  . PULMONARY NODULE 01/14/2007    [facility], December, 2011  . Takotsubo syndrome 04/22/2008    MI, May, 2008, question takotsubo event  . THYROID NODULE 01/14/2007  . IBS (irritable bowel syndrome)   . DJD (degenerative joint disease)     L shoulder impingement  . Ejection fraction     EF 60%, echo, July, 2010, (  EF improved after tach a suitable event 2008)  . Subarachnoid hemorrhage following injury 2007    Subarachnoid hemorrhage secondary to a fall December, 2007  . Bradycardia     August, 2013  . Family history of anesthesia  complication     daughter has a hard time waking up  . H/O hiatal hernia   . Abnormal CT of the chest     Stable patchy/nodular and ground-glass opacities in the right upper lobe, While grossly unchanged, low grade adenocarcinoma remains possible.   Past Surgical History  Procedure Laterality Date  . Cholecystectomy    . Abdominal hysterectomy    . Hip surgery    . Brain tumor excision      Meningioma; [facility] in 2008  . Shoulder surgery    . Joint replacement      rt THR   Social History:  reports that she has never smoked. She has never used smokeless tobacco. She reports that she does not drink alcohol or use illicit drugs.  where does patient live--home, ALF, SNF? and with whom if at home?  Can patient participate in ADLs?  Allergies  Allergen Reactions  . Atorvastatin Other (See Comments)    Raises liver enzymes (takes pravastatin at home)  . Influenza Vaccines   . Phenytoin Other (See Comments)    Blood clots  . Promethazine Hcl Other (See Comments)    Severe sedation  . Sulfacetamide Sodium     REACTION: sulfa  . Sulfamethoxazole     REACTION: unspecified    Family History  Problem Relation Age of Onset  . Heart attack Brother 12    (be sure to complete)  Prior to Admission medications   Medication Sig Start Date End Date Taking? Authorizing  Provider  clopidogrel (PLAVIX) 75 MG tablet Take 75 mg by mouth daily with breakfast.   Yes Historical Provider, MD  conjugated estrogens (PREMARIN) vaginal cream Place 1 Applicatorful vaginally daily as needed (vaginal dryness).   Yes Historical Provider, MD  cyanocobalamin (,VITAMIN B-12,) 1000 MCG/ML injection Inject 1 mL (1,000 mcg total) into the muscle every 30 (thirty) days. 01/05/13  Yes Gordy Savers, MD  isosorbide mononitrate (IMDUR) 30 MG 24 hr tablet Take 0.5 tablets (15 mg total) by mouth daily. 05/08/12  Yes Gordy Savers, MD  methimazole (TAPAZOLE) 5 MG tablet Take 5 mg by mouth See admin  instructions. Take 5mg  only on Mon., Wed., and Fri.   Yes Historical Provider, MD  nitroGLYCERIN (NITROSTAT) 0.4 MG SL tablet Place 1 tablet (0.4 mg total) under the tongue every 5 (five) minutes as needed. For chest pain. 04/30/12  Yes Vassie Loll, MD  pravastatin (PRAVACHOL) 20 MG tablet Take 20 mg by mouth daily. Every evening   Yes Historical Provider, MD  traMADol (ULTRAM) 50 MG tablet Take 50 mg by mouth every 6 (six) hours as needed for pain. For pain. 03/17/12  Yes Gordy Savers, MD  triamcinolone ointment (KENALOG) 0.1 % Apply 1 application topically 2 (two) times daily as needed. For dry skin.   Yes Historical Provider, MD   Physical Exam: Filed Vitals:   01/16/13 1542 01/16/13 1617 01/16/13 1740  BP: 158/72 171/67 149/80  Pulse: 58 57 59  Temp: 97.6 F (36.4 C)    Resp: 20 24 20   Height: 5\' 4"  (1.626 m)    Weight: 52.617 kg (116 lb)    SpO2: 96% 98% 97%     General:  Awake, in nad  Eyes: PERRL B  ENT: Membranes moist, dentition fair  Neck: trachea midline, neck supple  Cardiovascular: regular, s1, s2  Respiratory: normal resp effort, no wheezing  Abdomen: soft, nondistended  Skin: normal skin turgor, no abnormal skin lesions seen  Musculoskeletal: perfused, no clubbing or cyanosis  Psychiatric: mood/affect normal // no auditory/visual hallucinations  Neurologic: cn2-12 grossly intact, strength/sensation intact  Labs on Admission:  Basic Metabolic Panel:  Recent Labs Lab 01/16/13 1554  NA 136  K 4.3  CL 100  CO2 26  GLUCOSE 132*  BUN 19  CREATININE 0.87  CALCIUM 9.0   Liver Function Tests: No results found for this basename: AST, ALT, ALKPHOS, BILITOT, PROT, ALBUMIN,  in the last 168 hours No results found for this basename: LIPASE, AMYLASE,  in the last 168 hours No results found for this basename: AMMONIA,  in the last 168 hours CBC:  Recent Labs Lab 01/16/13 1554  WBC 6.5  HGB 11.8*  HCT 35.6*  MCV 91.5  PLT 221   Cardiac  Enzymes: No results found for this basename: CKTOTAL, CKMB, CKMBINDEX, TROPONINI,  in the last 168 hours  BNP (last 3 results)  Recent Labs  01/16/13 1554  PROBNP 373.9   CBG: No results found for this basename: GLUCAP,  in the last 168 hours  Radiological Exams on Admission: Dg Chest 2 View  01/16/2013   CLINICAL DATA:  Right-sided chest pain and bilateral arm numbness for 5 hr. History of stroke.  EXAM: CHEST  2 VIEW  COMPARISON:  09/20/2012.  FINDINGS: COPD is present with hyperinflation. Right upper lobe scarring is stable. No focal infiltrates or congestive failure. There is mild cardiac enlargement. Calcified tortuous aorta. No effusion. Osseous demineralization. Previous right shoulder surgery.  IMPRESSION: COPD, no active disease.  Stable chest.   Electronically Signed   By: Davonna Belling M.D.   On: 01/16/2013 17:13    EKG: Independently reviewed. NSR  Assessment/Plan Active Problems:   HYPERTENSION   CORONARY ARTERY DISEASE   Hyperlipidemia   Chest pain  1. Chest pain 1. Currently stable 2. Cont with PRN ntg 3. Will follow serial cardiac enzymes 4. Per previous records, pt is recommended to cont with med mgt for her hx of CAD 5. Pt also has been documented to have chest pain that "has become very clear that her pain is musculoskeletal." Dr. Myrtis Ser, 09/12/11 6. If pt rules in for ACS, would consider Cardiology consult for optimization for meds 7. Otherwise, would treat w/ analgesics/muscle relaxants as tolerated 8. Admit to med/tele 2. HTN 1. BP stable 2. Cont meds 3. HLD 1. Cont meds 4. DVT prophylaxis 1. Heparin subQ  Code Status: Full (must indicate code status--if unknown or must be presumed, indicate so) Family Communication: Pt in room (indicate person spoken with, if applicable, with phone number if by telephone) Disposition Plan: Pending (indicate anticipated LOS)  Time spent:  CHIU, STEPHEN K [facility] Pager [phone]  If 7PM-7AM,  please contact night-coverage [email or web address] Password [email or web address] 01/16/2013, 6:10 PM

## 2013-01-16 NOTE — ED Notes (Signed)
The pt is c/o bi-lateral upper chest numbness  Since 1200n today with numbness also in both arms.  Some sob none now.  Hx cardiac  problems

## 2013-01-16 NOTE — ED Notes (Signed)
Attempted to call report, nurse unavailable.

## 2013-01-16 NOTE — ED Provider Notes (Signed)
CSN: [id number]     Arrival date & time 01/16/13  1532 History   First MD Initiated Contact with Patient 01/16/13 1605     Chief Complaint  Patient presents with  . Chest Pain   (Consider location/radiation/quality/duration/timing/severity/associated sxs/prior Treatment) HPI Comments: Patient comes to the ER for evaluation of discomfort and "numbness" across her chest and both of her arms. Symptoms began around noon today. She reports that she has been short of breath with the symptoms. She feels very weak. Daughter is present, reports that she has been sick for a couple of weeks. Started with a sore throat cough, fever. Saw her primary doctor and not begin 2 weeks ago, hasn't completely recovered. Her blood pressure has been running high the last couple of days.  Patient is a 79 y.o. female presenting with chest pain.  Chest Pain Associated symptoms: numbness and weakness     Past Medical History  Diagnosis Date  . ANEMIA, PERNICIOUS 09/10/2006  . CORONARY ARTERY DISEASE 11/07/2008    a. NSTEMI 2001 & STEMI 2008 managed medically. b. Takotsubo 06/2006. c. Cath 2009 -> med rx.  . CVA WITH LEFT HEMIPARESIS 07/22/2007    May, 2009, neurology decided aspirin and Plavix at that time.  . DERMATITIS 03/02/2007  . HYPERTENSION 07/15/2006  . MENINGIOMA 08/27/2006    Occipital meningioma, surgery, [facility], 2008 /  MRI, [place], June, 2011, no change in lesion, history believe the patient had a gamma knife treatment of a right occipital atypical meningioma  . OSTEOARTHRITIS 07/15/2006  . PULMONARY NODULE 01/14/2007    [facility], December, 2011  . Takotsubo syndrome 04/22/2008    MI, May, 2008, question takotsubo event  . THYROID NODULE 01/14/2007  . IBS (irritable bowel syndrome)   . DJD (degenerative joint disease)     L shoulder impingement  . Ejection fraction     EF 60%, echo, July, 2010, (  EF improved after tach a suitable event 2008)  . Subarachnoid hemorrhage following injury 2007   Subarachnoid hemorrhage secondary to a fall December, 2007  . Bradycardia     August, 2013  . Family history of anesthesia complication     daughter has a hard time waking up  . H/O hiatal hernia   . Abnormal CT of the chest     Stable patchy/nodular and ground-glass opacities in the right upper lobe, While grossly unchanged, low grade adenocarcinoma remains possible.   Past Surgical History  Procedure Laterality Date  . Cholecystectomy    . Abdominal hysterectomy    . Hip surgery    . Brain tumor excision      Meningioma; treated at [facility] in 2008  . Shoulder surgery    . Joint replacement      rt THR   Family History  Problem Relation Age of Onset  . Heart attack Brother 60   History  Substance Use Topics  . Smoking status: Never Smoker   . Smokeless tobacco: Never Used  . Alcohol Use: No   OB History   Grav Para Term Preterm Abortions TAB SAB Ect Mult Living                 Review of Systems  Cardiovascular: Positive for chest pain.  Neurological: Positive for weakness and numbness.  All other systems reviewed and are negative.    Allergies  Atorvastatin; Influenza vaccines; Phenytoin; Promethazine hcl; Sulfacetamide sodium; and Sulfamethoxazole  Home Medications   Current Outpatient Rx  Name  Route  Sig  Dispense  Refill  . clopidogrel (PLAVIX) 75 MG tablet      TAKE 1 TABLET DAILY   90 tablet   1   . conjugated estrogens (PREMARIN) vaginal cream      Use 0.5 grams vaginally twice a week as needed   42.5 g   3     Dispense 3 month supply   . cyanocobalamin (,VITAMIN B-12,) 1000 MCG/ML injection   Intramuscular   Inject 1 mL (1,000 mcg total) into the muscle every 30 (thirty) days.   3 mL   3   . isosorbide mononitrate (IMDUR) 30 MG 24 hr tablet   Oral   Take 0.5 tablets (15 mg total) by mouth daily.   90 tablet   5   . methimazole (TAPAZOLE) 5 MG tablet      1 tab, 3 times a week.   40 tablet   1   . NEEDLE, DISP, 25 G (BD DISP  NEEDLES) 25G X 5/8" MISC   Does not apply   1 each by Does not apply route every 30 (thirty) days.   50 each   1   . nitroGLYCERIN (NITROSTAT) 0.4 MG SL tablet   Sublingual   Place 1 tablet (0.4 mg total) under the tongue every 5 (five) minutes as needed. For chest pain.   15 tablet   0   . polyethylene glycol powder (GLYCOLAX/MIRALAX) powder      Take 17 g by mouth daily as needed for constipation   3350 g   1   . pravastatin (PRAVACHOL) 20 MG tablet      TAKE 1 TABLET EVERY EVENING   90 tablet   3   . Syringe, Disposable, 3 ML MISC   Does not apply   1 each by Does not apply route every 30 (thirty) days.   25 each   1   . traMADol (ULTRAM) 50 MG tablet   Oral   Take 50 mg by mouth every 6 (six) hours as needed for pain. For pain.         Marland Kitchen triamcinolone ointment (KENALOG) 0.1 %   Topical   Apply 1 application topically 2 (two) times daily as needed. For dry skin.          BP 171/67  Pulse 57  Temp(Src) 97.6 F (36.4 C)  Resp 24  Ht 5\' 4"  (1.626 m)  Wt 116 lb (52.617 kg)  BMI 19.90 kg/m2  SpO2 98% Physical Exam  Constitutional: She is oriented to person, place, and time. She appears well-developed and well-nourished. No distress.  HENT:  Head: Normocephalic and atraumatic.  Right Ear: Hearing normal.  Left Ear: Hearing normal.  Nose: Nose normal.  Mouth/Throat: Oropharynx is clear and moist and mucous membranes are normal.  Eyes: Conjunctivae and EOM are normal. Pupils are equal, round, and reactive to light.  Neck: Normal range of motion. Neck supple.  Cardiovascular: Regular rhythm, S1 normal and S2 normal.  Exam reveals no gallop and no friction rub.   No murmur heard. Pulmonary/Chest: Effort normal and breath sounds normal. No respiratory distress. She exhibits no tenderness.  Abdominal: Soft. Normal appearance and bowel sounds are normal. There is no hepatosplenomegaly. There is no tenderness. There is no rebound, no guarding, no tenderness at  McBurney's point and negative Murphy's sign. No hernia.  Musculoskeletal: Normal range of motion.  Neurological: She is alert and oriented to person, place, and time. She has normal strength. No cranial nerve deficit or sensory deficit. Coordination  normal. GCS eye subscore is 4. GCS verbal subscore is 5. GCS motor subscore is 6.  Skin: Skin is warm, dry and intact. No rash noted. No cyanosis.  Psychiatric: She has a normal mood and affect. Her speech is normal and behavior is normal. Thought content normal.    ED Course  Procedures (including critical care time) Labs Review Labs Reviewed  CBC - Abnormal; Notable for the following:    Hemoglobin 11.8 (*)    HCT 35.6 (*)    All other components within normal limits  BASIC METABOLIC PANEL - Abnormal; Notable for the following:    Glucose, Bld 132 (*)    GFR calc non Af Amer 58 (*)    GFR calc Af Amer 67 (*)    All other components within normal limits  PRO B NATRIURETIC PEPTIDE  CBC  CREATININE, SERUM  COMPREHENSIVE METABOLIC PANEL  CBC  TROPONIN I  TROPONIN I  POCT I-STAT TROPONIN I   Imaging Review Dg Chest 2 View  01/16/2013   CLINICAL DATA:  Right-sided chest pain and bilateral arm numbness for 5 hr. History of stroke.  EXAM: CHEST  2 VIEW  COMPARISON:  09/20/2012.  FINDINGS: COPD is present with hyperinflation. Right upper lobe scarring is stable. No focal infiltrates or congestive failure. There is mild cardiac enlargement. Calcified tortuous aorta. No effusion. Osseous demineralization. Previous right shoulder surgery.  IMPRESSION: COPD, no active disease.  Stable chest.   Electronically Signed   By: Davonna Belling M.D.   On: 01/16/2013 17:13    EKG Interpretation    Date/Time:  Saturday January 16 2013 15:37:35 EST Ventricular Rate:  57 PR Interval:  202 QRS Duration: 74 QT Interval:  422 QTC Calculation: 410 R Axis:   38 Text Interpretation:  Sinus bradycardia Nonspecific ST abnormality Abnormal ECG No significant  change since last tracing Confirmed by Varian Innes  MD, Mellanie Bejarano ([id number]) on 01/16/2013 7:14:49 PM            MDM  Diagnosis: Chest pain  She presents to the ER for evaluation of chest discomfort. She has difficulty describing the discomfort, says it feels like a numbness across her chest and down both arms. She is short of breath with this. Patient does have a known cardiac history, his medical management her last cardiac catheterization. Her initial EKG is unchanged from previous. Troponin was negative. The patient was hypertensive on arrival. She does have a history of hypertension, but her pressure was far above baseline. This improved with nitroglycerin. Patient will require hospitalization for further evaluation of chest pain, possible cardiac etiology.    Gilda Crease, MD 01/16/13 [phone]

## 2013-01-17 DIAGNOSIS — E0590 Thyrotoxicosis, unspecified without thyrotoxic crisis or storm: Secondary | ICD-10-CM

## 2013-01-17 DIAGNOSIS — R0789 Other chest pain: Secondary | ICD-10-CM

## 2013-01-17 DIAGNOSIS — I10 Essential (primary) hypertension: Secondary | ICD-10-CM

## 2013-01-17 DIAGNOSIS — E785 Hyperlipidemia, unspecified: Secondary | ICD-10-CM

## 2013-01-17 DIAGNOSIS — I498 Other specified cardiac arrhythmias: Secondary | ICD-10-CM

## 2013-01-17 LAB — CBC
HCT: 34.7 % — ABNORMAL LOW (ref 36.0–46.0)
Hemoglobin: 11.4 g/dL — ABNORMAL LOW (ref 12.0–15.0)
MCH: 29.9 pg (ref 26.0–34.0)
MCV: 91.1 fL (ref 78.0–100.0)
Platelets: 185 10*3/uL (ref 150–400)
RBC: 3.81 MIL/uL — ABNORMAL LOW (ref 3.87–5.11)
WBC: 6.3 10*3/uL (ref 4.0–10.5)

## 2013-01-17 LAB — COMPREHENSIVE METABOLIC PANEL
ALT: 8 U/L (ref 0–35)
Albumin: 3.4 g/dL — ABNORMAL LOW (ref 3.5–5.2)
BUN: 16 mg/dL (ref 6–23)
Chloride: 103 mEq/L (ref 96–112)
Creatinine, Ser: 0.86 mg/dL (ref 0.50–1.10)
GFR calc non Af Amer: 59 mL/min — ABNORMAL LOW (ref >90)
Total Bilirubin: 0.4 mg/dL (ref 0.3–1.2)

## 2013-01-17 LAB — TROPONIN I: Troponin I: <0.3 ng/mL (ref <0.30)

## 2013-01-17 NOTE — Discharge Summary (Signed)
Physician Discharge Summary  [name] ZOX:[id number] DOB: 09/07/1922 DOA: 01/16/2013  PCP: Rogelia Boga, MD  Admit date: 01/16/2013 Discharge date: 01/17/2013  Recommendations for Outpatient Follow-up:  1. Pt will need to follow up with PCP in 2 weeks post discharge 2. Please obtain BMP to evaluate electrolytes and kidney function 3. Please also check CBC to evaluate Hg and Hct levels  Discharge Diagnoses:  Active Problems:   HYPERTENSION   CORONARY ARTERY DISEASE   Hyperlipidemia   Chest pain atypical chest pain -Finished cycling troponins which were negative -EKG unchanged, no concerning ST-T changes -Suspect musculoskeletal related pain -Followup with Dr. Myrtis Ser in outpt setting -Patient was chest pain-free and hemodynamically stable throughout the hospitalization CAD -Patient has history of Takatsubo syndrome -STEMI in 2008 -Continue Plavix, Imdur Hypertension -Blood pressure has been labile during the admission -Patient tofollowup with primary care provider for blood pressure check and adjustment of antihypertensive medications if necessary Hyperlipidemia -Continue statin Hyperthyroidism  -Continue methimazole  -Followup with Dr. Everardo All in the outpatient setting  -TSH 1.16 on 12/24/2012    Discharge Condition: Stable   Disposition: Discharge home   Diet: Heart healthy  Wt Readings from Last 3 Encounters:  01/16/13 51.846 kg (114 lb 4.8 oz)  01/05/13 53.071 kg (117 lb)  12/24/12 54.023 kg (119 lb 1.6 oz)     Hospital Course:  79-year-old female with a history of CAD, STEMI in 2008, Takatsubo syndrome in 06/2006, stroke, meningioma, hypertension, hyperlipidemia presents with sudden onset of chest discomfort on 01/16/2013. She stated that she had numbness that spread to her bilateral arms and fingers. The patient states that it lasted 9 hours. However she states that she has had on and off chest discomfort at rest for many months. She is compliant  with her Plavix. She had some shortness of breath, but denied any diaphoresis, nausea, vomiting, dizziness, syncope. There is no dysuria or hematuria. Currently the patient remains pain-free without any symptoms. Troponins were negative x3. Chest x-ray was negative. EKG showed nonspecific T-wave changes that were unchanged. The patient denies any recent injury, trauma, surgery, long travels, history of malignancy. The patient remained pain-free and hemodynamically stable throughout the hospitalization. She was instructed to follow up with her cardiologist, Dr. Myrtis Ser in the outpatient setting.    Discharge Exam: Filed Vitals:   01/17/13 0918  BP: 128/58  Pulse: 56  Temp:   Resp:    Filed Vitals:   01/16/13 1900 01/16/13 2001 01/17/13 0526 01/17/13 0918  BP: 157/77 171/83 140/68 128/58  Pulse: 57 58 56 56  Temp:  98 F (36.7 C) 97.8 F (36.6 C)   TempSrc:  Oral Oral   Resp: 17 16 15    Height:  5\' 4"  (1.626 m)    Weight:  51.846 kg (114 lb 4.8 oz)    SpO2: 98% 98% 99%    General: A&O x 3, NAD, pleasant, cooperative Cardiovascular: RRR, no rub, no gallop, no S3 Respiratory: CTAB, no wheeze, no rhonchi Abdomen:soft, nontender, nondistended, positive bowel sounds Extremities: No edema, No lymphangitis, no petechiae  Discharge Instructions       Future Appointments Provider Department Dept Phone   03/11/2013 10:15 AM Romero Belling, MD [facility] [phone]   05/24/2013 10:00 AM Gordy Savers, MD  [facility] [phone]       Medication List         clopidogrel 75 MG tablet  Commonly known as:  PLAVIX  Take 75 mg by mouth daily with breakfast.  conjugated estrogens vaginal cream  Commonly known as:  PREMARIN  Place 1 Applicatorful vaginally daily as needed (vaginal dryness).     cyanocobalamin 1000 MCG/ML injection  Commonly known as:  (VITAMIN B-12)  Inject 1 mL (1,000 mcg total) into the muscle every 30 (thirty) days.      isosorbide mononitrate 30 MG 24 hr tablet  Commonly known as:  IMDUR  Take 0.5 tablets (15 mg total) by mouth daily.     methimazole 5 MG tablet  Commonly known as:  TAPAZOLE  Take 5 mg by mouth See admin instructions. Take 5mg  only on Mon., Wed., and Fri.     nitroGLYCERIN 0.4 MG SL tablet  Commonly known as:  NITROSTAT  Place 1 tablet (0.4 mg total) under the tongue every 5 (five) minutes as needed. For chest pain.     pravastatin 20 MG tablet  Commonly known as:  PRAVACHOL  Take 20 mg by mouth daily. Every evening     traMADol 50 MG tablet  Commonly known as:  ULTRAM  Take 50 mg by mouth every 6 (six) hours as needed for pain. For pain.     triamcinolone ointment 0.1 %  Commonly known as:  KENALOG  Apply 1 application topically 2 (two) times daily as needed. For dry skin.         The results of significant diagnostics from this hospitalization (including imaging, microbiology, ancillary and laboratory) are listed below for reference.    Significant Diagnostic Studies: Dg Chest 2 View  01/16/2013   CLINICAL DATA:  Right-sided chest pain and bilateral arm numbness for 5 hr. History of stroke.  EXAM: CHEST  2 VIEW  COMPARISON:  09/20/2012.  FINDINGS: COPD is present with hyperinflation. Right upper lobe scarring is stable. No focal infiltrates or congestive failure. There is mild cardiac enlargement. Calcified tortuous aorta. No effusion. Osseous demineralization. Previous right shoulder surgery.  IMPRESSION: COPD, no active disease.  Stable chest.   Electronically Signed   By: Davonna Belling M.D.   On: 01/16/2013 17:13     Microbiology: No results found for this or any previous visit (from the past 240 hour(s)).   Labs: Basic Metabolic Panel:  Recent Labs Lab 01/16/13 1554 01/16/13 1851 01/17/13 0325  NA 136  --  139  K 4.3  --  4.2  CL 100  --  103  CO2 26  --  28  GLUCOSE 132*  --  85  BUN 19  --  16  CREATININE 0.87 0.85 0.86  CALCIUM 9.0  --  8.9    Liver Function Tests:  Recent Labs Lab 01/17/13 0325  AST 15  ALT 8  ALKPHOS 92  BILITOT 0.4  PROT 6.1  ALBUMIN 3.4*   No results found for this basename: LIPASE, AMYLASE,  in the last 168 hours No results found for this basename: AMMONIA,  in the last 168 hours CBC:  Recent Labs Lab 01/16/13 1554 01/16/13 1851 01/17/13 0325  WBC 6.5 7.2 6.3  HGB 11.8* 11.1* 11.4*  HCT 35.6* 33.7* 34.7*  MCV 91.5 91.3 91.1  PLT 221 208 185   Cardiac Enzymes:  Recent Labs Lab 01/16/13 1857 01/17/13 0325 01/17/13 0820  TROPONINI <0.30 <0.30 <0.30   BNP: No components found with this basename: POCBNP,  CBG: No results found for this basename: GLUCAP,  in the last 168 hours  Time coordinating discharge:  Greater than 30 minutes  Signed:  Shelah Heatley, DO [facility] Pager: [phone] 01/17/2013, 10:25 AM

## 2013-01-30 ENCOUNTER — Emergency Department (HOSPITAL_COMMUNITY): Payer: Medicare Other

## 2013-01-30 ENCOUNTER — Encounter (HOSPITAL_COMMUNITY): Payer: Self-pay | Admitting: Emergency Medicine

## 2013-01-30 ENCOUNTER — Observation Stay (HOSPITAL_COMMUNITY)
Admission: EM | Admit: 2013-01-30 | Discharge: 2013-02-01 | Disposition: A | Discharge status: Home / Self Care | Payer: Medicare Other | Attending: Family Medicine | Admitting: Family Medicine

## 2013-01-30 DIAGNOSIS — I69959 Hemiplegia and hemiparesis following unspecified cerebrovascular disease affecting unspecified side: Secondary | ICD-10-CM

## 2013-01-30 DIAGNOSIS — I5181 Takotsubo syndrome: Secondary | ICD-10-CM

## 2013-01-30 DIAGNOSIS — E041 Nontoxic single thyroid nodule: Secondary | ICD-10-CM

## 2013-01-30 DIAGNOSIS — L259 Unspecified contact dermatitis, unspecified cause: Secondary | ICD-10-CM

## 2013-01-30 DIAGNOSIS — I10 Essential (primary) hypertension: Secondary | ICD-10-CM

## 2013-01-30 DIAGNOSIS — R0600 Dyspnea, unspecified: Secondary | ICD-10-CM

## 2013-01-30 DIAGNOSIS — J984 Other disorders of lung: Secondary | ICD-10-CM

## 2013-01-30 DIAGNOSIS — I2510 Atherosclerotic heart disease of native coronary artery without angina pectoris: Secondary | ICD-10-CM

## 2013-01-30 DIAGNOSIS — R0789 Other chest pain: Secondary | ICD-10-CM

## 2013-01-30 DIAGNOSIS — R0602 Shortness of breath: Secondary | ICD-10-CM

## 2013-01-30 DIAGNOSIS — R5381 Other malaise: Secondary | ICD-10-CM | POA: Insufficient documentation

## 2013-01-30 DIAGNOSIS — R002 Palpitations: Secondary | ICD-10-CM

## 2013-01-30 DIAGNOSIS — E785 Hyperlipidemia, unspecified: Secondary | ICD-10-CM

## 2013-01-30 DIAGNOSIS — Z882 Allergy status to sulfonamides status: Secondary | ICD-10-CM | POA: Insufficient documentation

## 2013-01-30 DIAGNOSIS — Z888 Allergy status to other drugs, medicaments and biological substances status: Secondary | ICD-10-CM | POA: Insufficient documentation

## 2013-01-30 DIAGNOSIS — M1990 Unspecified osteoarthritis, unspecified site: Secondary | ICD-10-CM

## 2013-01-30 DIAGNOSIS — R42 Dizziness and giddiness: Secondary | ICD-10-CM

## 2013-01-30 DIAGNOSIS — IMO0002 Reserved for concepts with insufficient information to code with codable children: Secondary | ICD-10-CM

## 2013-01-30 DIAGNOSIS — E0590 Thyrotoxicosis, unspecified without thyrotoxic crisis or storm: Secondary | ICD-10-CM

## 2013-01-30 DIAGNOSIS — I200 Unstable angina: Principal | ICD-10-CM

## 2013-01-30 DIAGNOSIS — K219 Gastro-esophageal reflux disease without esophagitis: Secondary | ICD-10-CM

## 2013-01-30 DIAGNOSIS — R001 Bradycardia, unspecified: Secondary | ICD-10-CM

## 2013-01-30 DIAGNOSIS — R0989 Other specified symptoms and signs involving the circulatory and respiratory systems: Secondary | ICD-10-CM | POA: Insufficient documentation

## 2013-01-30 DIAGNOSIS — N390 Urinary tract infection, site not specified: Secondary | ICD-10-CM

## 2013-01-30 DIAGNOSIS — R1310 Dysphagia, unspecified: Secondary | ICD-10-CM

## 2013-01-30 DIAGNOSIS — Z7902 Long term (current) use of antithrombotics/antiplatelets: Secondary | ICD-10-CM | POA: Insufficient documentation

## 2013-01-30 DIAGNOSIS — I498 Other specified cardiac arrhythmias: Secondary | ICD-10-CM | POA: Insufficient documentation

## 2013-01-30 DIAGNOSIS — R911 Solitary pulmonary nodule: Secondary | ICD-10-CM | POA: Insufficient documentation

## 2013-01-30 DIAGNOSIS — D320 Benign neoplasm of cerebral meninges: Secondary | ICD-10-CM

## 2013-01-30 DIAGNOSIS — D510 Vitamin B12 deficiency anemia due to intrinsic factor deficiency: Secondary | ICD-10-CM

## 2013-01-30 DIAGNOSIS — Z887 Allergy status to serum and vaccine status: Secondary | ICD-10-CM | POA: Insufficient documentation

## 2013-01-30 DIAGNOSIS — Z79899 Other long term (current) drug therapy: Secondary | ICD-10-CM

## 2013-01-30 DIAGNOSIS — R0609 Other forms of dyspnea: Secondary | ICD-10-CM | POA: Insufficient documentation

## 2013-01-30 DIAGNOSIS — R9430 Abnormal result of cardiovascular function study, unspecified: Secondary | ICD-10-CM

## 2013-01-30 DIAGNOSIS — R079 Chest pain, unspecified: Secondary | ICD-10-CM

## 2013-01-30 LAB — PRO B NATRIURETIC PEPTIDE: Pro B Natriuretic peptide (BNP): 604.2 pg/mL — ABNORMAL HIGH (ref 0–450)

## 2013-01-30 LAB — CBC
HCT: 33.9 % — ABNORMAL LOW (ref 36.0–46.0)
Hemoglobin: 11.3 g/dL — ABNORMAL LOW (ref 12.0–15.0)
MCH: 30.5 pg (ref 26.0–34.0)
MCHC: 33.3 g/dL (ref 30.0–36.0)
MCV: 91.6 fL (ref 78.0–100.0)
Platelets: 193 10*3/uL (ref 150–400)
RDW: 14 % (ref 11.5–15.5)
WBC: 7 10*3/uL (ref 4.0–10.5)

## 2013-01-30 LAB — COMPREHENSIVE METABOLIC PANEL
ALT: 11 U/L (ref 0–35)
AST: 19 U/L (ref 0–37)
CO2: 27 mEq/L (ref 19–32)
Chloride: 102 mEq/L (ref 96–112)
GFR calc non Af Amer: 59 mL/min — ABNORMAL LOW (ref >90)
Sodium: 139 mEq/L (ref 135–145)
Total Bilirubin: 0.2 mg/dL — ABNORMAL LOW (ref 0.3–1.2)

## 2013-01-30 LAB — CBC WITH DIFFERENTIAL/PLATELET
Basophils Absolute: 0 10*3/uL (ref 0.0–0.1)
Eosinophils Absolute: 0.2 10*3/uL (ref 0.0–0.7)
Hemoglobin: 12 g/dL (ref 12.0–15.0)
Lymphocytes Relative: 16 % (ref 12–46)
MCV: 91.6 fL (ref 78.0–100.0)
Neutro Abs: 5.4 10*3/uL (ref 1.7–7.7)
Neutrophils Relative %: 69 % (ref 43–77)
Platelets: 245 10*3/uL (ref 150–400)
RBC: 3.95 MIL/uL (ref 3.87–5.11)
RDW: 14 % (ref 11.5–15.5)
WBC: 7.9 10*3/uL (ref 4.0–10.5)

## 2013-01-30 LAB — LACTIC ACID, PLASMA: Lactic Acid, Venous: 2 mmol/L (ref 0.5–2.2)

## 2013-01-30 LAB — URINALYSIS W MICROSCOPIC + REFLEX CULTURE
Bilirubin Urine: NEGATIVE
Glucose, UA: NEGATIVE mg/dL
Hgb urine dipstick: NEGATIVE
Nitrite: NEGATIVE
Specific Gravity, Urine: 1.015 (ref 1.005–1.030)
pH: 6 (ref 5.0–8.0)

## 2013-01-30 LAB — CREATININE, SERUM
GFR calc Af Amer: 74 mL/min — ABNORMAL LOW (ref >90)
GFR calc non Af Amer: 64 mL/min — ABNORMAL LOW (ref >90)

## 2013-01-30 MED ORDER — NITROGLYCERIN 2 % TD OINT
1.0000 [in_us] | TOPICAL_OINTMENT | Freq: Four times a day (QID) | TRANSDERMAL | Status: DC
  Administered 2013-01-30 – 2013-01-31 (×4): 1 [in_us] via TOPICAL
  Filled 2013-01-30: qty 30

## 2013-01-30 MED ORDER — SIMVASTATIN 10 MG PO TABS: 10.0000 mg | ORAL_TABLET | Freq: Every day | ORAL | Status: DC

## 2013-01-30 MED ORDER — IOHEXOL 350 MG/ML SOLN
80.0000 mL | Freq: Once | INTRAVENOUS | Status: AC | PRN
  Administered 2013-01-30: 80 mL via INTRAVENOUS

## 2013-01-30 MED ORDER — CYANOCOBALAMIN 1000 MCG/ML IJ SOLN: 1000.0000 ug | INTRAMUSCULAR | Status: DC

## 2013-01-30 MED ORDER — NITROGLYCERIN 0.4 MG SL SUBL: 0.4000 mg | SUBLINGUAL_TABLET | SUBLINGUAL | Status: DC | PRN

## 2013-01-30 MED ORDER — PRAVASTATIN SODIUM 20 MG PO TABS: 20.0000 mg | ORAL_TABLET | Freq: Every day | ORAL | Status: DC

## 2013-01-30 MED ORDER — DOCUSATE SODIUM 100 MG PO CAPS
100.0000 mg | ORAL_CAPSULE | Freq: Two times a day (BID) | ORAL | Status: DC
  Administered 2013-01-31: 100 mg via ORAL
  Filled 2013-01-30 (×5): qty 1

## 2013-01-30 MED ORDER — SODIUM CHLORIDE 0.9 % IV SOLN
INTRAVENOUS | Status: DC
  Administered 2013-01-30: 17:00:00 via INTRAVENOUS

## 2013-01-30 MED ORDER — ISOSORBIDE MONONITRATE 15 MG HALF TABLET
15.0000 mg | ORAL_TABLET | Freq: Every day | ORAL | Status: DC
  Administered 2013-01-31 – 2013-02-01 (×2): 15 mg via ORAL
  Filled 2013-01-30 (×2): qty 1

## 2013-01-30 MED ORDER — PRAVASTATIN SODIUM 20 MG PO TABS
20.0000 mg | ORAL_TABLET | Freq: Every day | ORAL | Status: DC
  Administered 2013-01-30 – 2013-01-31 (×2): 20 mg via ORAL
  Filled 2013-01-30 (×3): qty 1

## 2013-01-30 MED ORDER — ONDANSETRON HCL 4 MG/2ML IJ SOLN: 4.0000 mg | Freq: Four times a day (QID) | INTRAMUSCULAR | Status: DC | PRN

## 2013-01-30 MED ORDER — ENOXAPARIN SODIUM 40 MG/0.4ML ~~LOC~~ SOLN
40.0000 mg | SUBCUTANEOUS | Status: DC
  Administered 2013-01-30: 40 mg via SUBCUTANEOUS
  Filled 2013-01-30 (×3): qty 0.4

## 2013-01-30 MED ORDER — SODIUM CHLORIDE 0.9 % IJ SOLN
3.0000 mL | Freq: Two times a day (BID) | INTRAMUSCULAR | Status: DC
  Administered 2013-01-30 – 2013-02-01 (×4): 3 mL via INTRAVENOUS

## 2013-01-30 MED ORDER — CLOPIDOGREL BISULFATE 75 MG PO TABS
75.0000 mg | ORAL_TABLET | Freq: Every day | ORAL | Status: DC
  Administered 2013-01-31 – 2013-02-01 (×2): 75 mg via ORAL
  Filled 2013-01-30: qty 1

## 2013-01-30 MED ORDER — ONDANSETRON HCL 4 MG PO TABS: 4.0000 mg | ORAL_TABLET | Freq: Four times a day (QID) | ORAL | Status: DC | PRN

## 2013-01-30 MED ORDER — HYDROMORPHONE HCL PF 1 MG/ML IJ SOLN: 0.5000 mg | INTRAMUSCULAR | Status: DC | PRN

## 2013-01-30 NOTE — ED Notes (Signed)
Pt was unstable during ambulation. During ambulation her oxygen saturation stayed above 98%. Pt states that she felt very weak.

## 2013-01-30 NOTE — ED Notes (Signed)
Per EMS- pt began having SOB and chest pain at rest. Pain radiated from left shoulder blade to left breast. Pt has been seen previously for same with no diagnosis but is scheduled to see a cardiologist. VS WDL.

## 2013-01-30 NOTE — ED Notes (Signed)
Patient transported to X-ray 

## 2013-01-30 NOTE — ED Notes (Signed)
Patient transported to CT 

## 2013-01-30 NOTE — ED Notes (Addendum)
Pt received 324mg  of asprin and 2 nitro with EMS pain decreased from a 9/10 to 3/10. Pt on 2L of oxygen for comfort.

## 2013-01-30 NOTE — ED Notes (Signed)
Pt stated that she felt a little dizzy when standing but was able to stand without assist

## 2013-01-30 NOTE — H&P (Signed)
[facility] History and Physical  Milianna Ericsson ZOX:[id number] DOB: Sep 23, 1922    PCP:   Rogelia Boga, MD   Chief Complaint: shortness of breath and left arm pain and tingling.  HPI: [name] is an 79 y.o. female with hx of 79 year old female with a history of CAD (s/p NSTEMI and STEMI in 2001 and 2008 respectively, medically managed, most recent cath in 2009 for chest pain revealed nonobstructive CAD with recommended medical management), Takotsubo syndrome (on 06/2006 cardiac cath; apical ballooning, EF 45%, MR), CVA with left hemiparesis, pulmonary nodule, left shoulder orthopedic problems (left cervical facet and impingement syndromes), hyperthyroidism on tapazole, HTN, and HLD came to the hospital again for left neck pain with radiation to her left arm, accompanied by SOB this time.  She also has mild chest tightness.  She did not have any nausea or vomiting, but was a little diaphoresis.  These events lasted about 15 minutes.  She was given NTG with improvements.  In the ER, she did not have any symptomology.  In the past, she had bradycardia precluding the use of betablocker.  In general, she is functional and lives alone.  She has no abdominal pain, black or bloody stool.  She has been on Plavix since her CVA, and has had no cardiac stent nor prior CABG.  There is no weakness on her left upper extremity.  Work up included a clear CXR, negative CTPA, negative troponin, normal LFTs, normal WBC and Hb.  EKG showed NSR, unifocal PVC, and no acute ST-T changes.  Hospitalist was asked to admit her for further evalaution and tx.  Rewiew of Systems:  Constitutional: Negative for malaise, fever and chills. No significant weight loss or weight gain Eyes: Negative for eye pain, redness and discharge, diplopia, visual changes, or flashes of light. ENMT: Negative for ear pain, hoarseness, nasal congestion, sinus pressure and sore throat. No headaches; tinnitus, drooling, or problem  swallowing. Cardiovascular: Negative for palpitations, diaphoresis, dyspnea and peripheral edema. ; No orthopnea, PND Respiratory: Negative for cough, hemoptysis, wheezing and stridor. No pleuritic chestpain. Gastrointestinal: Negative for nausea, vomiting, diarrhea, constipation, abdominal pain, melena, blood in stool, hematemesis, jaundice and rectal bleeding.    Genitourinary: Negative for frequency, dysuria, incontinence,flank pain and hematuria; Musculoskeletal: Negative for back pain and neck pain. Negative for swelling and trauma.;  Skin: . Negative for pruritus, rash, abrasions, bruising and skin lesion.; ulcerations Neuro: Negative for headache, lightheadedness and neck stiffness. Negative for weakness, altered level of consciousness , altered mental status, extremity weakness, burning feet, involuntary movement, seizure and syncope.  Psych: negative for anxiety, depression, insomnia, tearfulness, panic attacks, hallucinations, paranoia, suicidal or homicidal ideation.   Past Medical History  Diagnosis Date  . ANEMIA, PERNICIOUS 09/10/2006  . CORONARY ARTERY DISEASE 11/07/2008    a. NSTEMI 2001 & STEMI 2008 managed medically. b. Takotsubo 06/2006. c. Cath 2009 -> med rx.  . CVA WITH LEFT HEMIPARESIS 07/22/2007    May, 2009, neurology decided aspirin and Plavix at that time.  . DERMATITIS 03/02/2007  . HYPERTENSION 07/15/2006  . MENINGIOMA 08/27/2006    Occipital meningioma, surgery, [facility], 2008 /  MRI, [place], June, 2011, no change in lesion, history believe the patient had a gamma knife treatment of a right occipital atypical meningioma  . OSTEOARTHRITIS 07/15/2006  . PULMONARY NODULE 01/14/2007    [facility], December, 2011  . Takotsubo syndrome 04/22/2008    MI, May, 2008, question takotsubo event  . THYROID NODULE 01/14/2007  . IBS (irritable bowel syndrome)   .  DJD (degenerative joint disease)     L shoulder impingement  . Ejection fraction     EF 60%, echo, July, 2010, (  EF  improved after tach a suitable event 2008)  . Subarachnoid hemorrhage following injury 2007    Subarachnoid hemorrhage secondary to a fall December, 2007  . Bradycardia     August, 2013  . Family history of anesthesia complication     daughter has a hard time waking up  . H/O hiatal hernia   . Abnormal CT of the chest     Stable patchy/nodular and ground-glass opacities in the right upper lobe, While grossly unchanged, low grade adenocarcinoma remains possible.  . Stroke     Past Surgical History  Procedure Laterality Date  . Cholecystectomy    . Abdominal hysterectomy    . Hip surgery    . Brain tumor excision      Meningioma; treated at [facility] in 2008  . Shoulder surgery    . Joint replacement      rt THR    Medications:  HOME MEDS: Prior to Admission medications   Medication Sig Start Date End Date Taking? Authorizing Provider  clopidogrel (PLAVIX) 75 MG tablet Take 75 mg by mouth daily with breakfast.   Yes Historical Provider, MD  conjugated estrogens (PREMARIN) vaginal cream Place 1 Applicatorful vaginally daily as needed (vaginal dryness).   Yes Historical Provider, MD  cyanocobalamin (,VITAMIN B-12,) 1000 MCG/ML injection Inject 1 mL (1,000 mcg total) into the muscle every 30 (thirty) days. 01/05/13  Yes Gordy Savers, MD  isosorbide mononitrate (IMDUR) 30 MG 24 hr tablet Take 0.5 tablets (15 mg total) by mouth daily. 05/08/12  Yes Gordy Savers, MD  methimazole (TAPAZOLE) 5 MG tablet Take 5 mg by mouth See admin instructions. Take 5mg  only on Mon., Wed., and Fri.   Yes Historical Provider, MD  pravastatin (PRAVACHOL) 20 MG tablet Take 20 mg by mouth daily. Every evening   Yes Historical Provider, MD  traMADol (ULTRAM) 50 MG tablet Take 50 mg by mouth every 6 (six) hours as needed for pain. For pain. 03/17/12  Yes Gordy Savers, MD  triamcinolone ointment (KENALOG) 0.1 % Apply 1 application topically 2 (two) times daily as needed. For dry skin.   Yes  Historical Provider, MD  nitroGLYCERIN (NITROSTAT) 0.4 MG SL tablet Place 1 tablet (0.4 mg total) under the tongue every 5 (five) minutes as needed. For chest pain. 04/30/12   Vassie Loll, MD     Allergies:  Allergies  Allergen Reactions  . Atorvastatin Other (See Comments)    Raises liver enzymes (takes pravastatin at home)  . Influenza Vaccines   . Phenytoin Other (See Comments)    Blood clots  . Promethazine Hcl Other (See Comments)    Severe sedation  . Sulfacetamide Sodium     REACTION: sulfa  . Sulfamethoxazole     REACTION: unspecified    Social History:   reports that she has never smoked. She has never used smokeless tobacco. She reports that she does not drink alcohol or use illicit drugs.  Family History: Family History  Problem Relation Age of Onset  . Heart attack Brother 84     Physical Exam: Filed Vitals:   01/30/13 1618 01/30/13 1700 01/30/13 1830 01/30/13 1931  BP: 116/70 109/53 135/72 143/66  Pulse: 72 49 65 61  Resp:   17 24  SpO2:  99% 100% 99%   Blood pressure 143/66, pulse 61, resp. rate 24,  SpO2 99.00%.  GEN:  Pleasant patient lying in the stretcher in no acute distress; cooperative with exam. PSYCH:  alert and oriented x4; does not appear anxious or depressed; affect is appropriate. HEENT: Mucous membranes pink and anicteric; PERRLA; EOM intact; no cervical lymphadenopathy nor thyromegaly or carotid bruit; no JVD; There were no stridor. Neck is very supple. Breasts:: Not examined CHEST WALL: No tenderness CHEST: Normal respiration, clear to auscultation bilaterally. No rales or wheezing. HEART: Regular rate and rhythm.  There are no murmur, rub, or gallops.   BACK: No kyphosis or scoliosis; no CVA tenderness ABDOMEN: soft and non-tender; no masses, no organomegaly, normal abdominal bowel sounds; no pannus; no intertriginous candida. There is no rebound and no distention. Rectal Exam: Not done EXTREMITIES: No bone or joint deformity;  age-appropriate arthropathy of the hands and knees; no edema; no ulcerations.  There is no calf tenderness. Genitalia: not examined PULSES: 2+ and symmetric SKIN: Normal hydration no rash or ulceration CNS: Cranial nerves 2-12 grossly intact no focal lateralizing neurologic deficit.  Speech is fluent; uvula elevated with phonation, facial symmetry and tongue midline. DTR are normal bilaterally, cerebella exam is intact, barbinski is negative and strengths are equaled bilaterally.  No sensory loss.   Labs on Admission:  Basic Metabolic Panel:  Recent Labs Lab 01/30/13 1627  NA 139  K 4.3  CL 102  CO2 27  GLUCOSE 111*  BUN 19  CREATININE 0.85  CALCIUM 9.0   Liver Function Tests:  Recent Labs Lab 01/30/13 1627  AST 19  ALT 11  ALKPHOS 101  BILITOT 0.2*  PROT 6.7  ALBUMIN 3.8   No results found for this basename: LIPASE, AMYLASE,  in the last 168 hours No results found for this basename: AMMONIA,  in the last 168 hours CBC:  Recent Labs Lab 01/30/13 1627  WBC 7.9  NEUTROABS 5.4  HGB 12.0  HCT 36.2  MCV 91.6  PLT 245   Cardiac Enzymes:  Recent Labs Lab 01/30/13 1627  TROPONINI <0.30    CBG: No results found for this basename: GLUCAP,  in the last 168 hours   Radiological Exams on Admission: Dg Chest 2 View  01/30/2013   CLINICAL DATA:  Mid chest pain. Shortness of breath. Prior history of MI.  EXAM: CHEST  2 VIEW  COMPARISON:  01/16/2013, 09/20/2012.  PET-CT 01/09/2010.  FINDINGS: Cardiac silhouette moderately enlarged but stable. Thoracic aorta tortuous and atherosclerotic, unchanged. Small hiatal hernia, unchanged. Hilar and mediastinal contours otherwise unremarkable. Lungs hyperinflated with emphysematous changes in the upper lobes, unchanged. Lungs clear. Bronchovascular markings normal. Pulmonary vascularity normal. No visible pleural effusions. No pneumothorax. Mild degenerative changes involving the thoracic spine.  IMPRESSION: Stable cardiomegaly  without pulmonary edema. COPD/emphysema. No acute cardiopulmonary disease. Stable small hiatal hernia.   Electronically Signed   By: Hulan Saas M.D.   On: 01/30/2013 18:01   Ct Angio Chest Pe W/cm &/or Wo Cm  01/30/2013   CLINICAL DATA:  Shortness of breath and chest pain at rest.  EXAM: CT ANGIOGRAPHY CHEST WITH CONTRAST  TECHNIQUE: Multidetector CT imaging of the chest was performed using the standard protocol during bolus administration of intravenous contrast. Multiplanar CT image reconstructions including MIPs were obtained to evaluate the vascular anatomy.  CONTRAST:  80mL OMNIPAQUE IOHEXOL 350 MG/ML SOLN  COMPARISON:  Chest CT, 11/17/2011  FINDINGS: No evidence of a pulmonary embolism. The heart is enlarged. There is mild dilation of the ascending aorta to 3.9 cm. No aortic dissection. There is  a 9 mm short axis pretracheal lymph node above the aortic arch, stable. There are no mediastinal or hilar masses or pathologically enlarged lymph nodes.  There is a focal area of irregular opacity in the right upper lobe which is stable consistent with scarring. There is mild associated bronchiectasis. There is a small regular, 6 mm, nodule in the left upper lobe, with a smaller nodule below this in the left upper lobe, both of which are also stable. There are coarse reticular opacities that are most evident in the dependent lower lobes. This is similar to the prior exam likely combination of scarring and atelectasis. There are no areas of consolidation. No pleural effusion is seen.  Limited visualization of the upper abdominal structures shows no acute findings.  There are 2 vertebral hemangiomas, T6 and T12. There are no osteoblastic or osteolytic lesions.  Review of the MIP images confirms the above findings.  IMPRESSION: 1. No evidence of a pulmonary embolism. 2. No acute findings.  No change from the prior chest CT. 3. Cardiomegaly. Stable areas of lung scarring as well as small stable pulmonary nodules.    Electronically Signed   By: Amie Portland M.D.   On: 01/30/2013 18:46    EKG: Independently reviewed. NSR with no acute ST-T changes.   Assessment/Plan Present on Admission:  . Unstable angina pectoris . Shortness of breath . ANEMIA, PERNICIOUS . CORONARY ARTERY DISEASE . GERD (gastroesophageal reflux disease) . HYPERTENSION  PLAN:  I suspect she has unstable angina.   She does have hx of cervical arthopathy with facet problem, but the pain usually last longer and not having shortness of breath and diaphoresis.  Her initial troponin and EKG was unrevealing.  She also has hx of bradycardia, and though her HR was 78, it did dip down to less than 50.  I am hesitant to add betablocker, even at low dose.  Will admit her for r/out.  Add NTP temporarily, and continue her Plavix.  Please consult cardiology for further recommendation. She had a cath, with nonobtructive cardiomyopathy, and recommended medical therapy, but that was over 6 years ago.   She has advanced age, but is still very functional.  She has GERD, which could be a possibility presenting her symptoms.  For her hyperthyroidism, will hold tapazole pending a free T4 and TSH.  She has no leukopenia with it.  I have continued her home meds.  She is stable, full code, and will be admitted to [facility] service.  Thank you for asking me to participate in her care.  Other plans as per orders.  Code Status: FULL Unk Lightning, MD. [facility] Pager [phone] 7pm to 7am.  01/30/2013, 8:00 PM

## 2013-01-30 NOTE — ED Provider Notes (Signed)
CSN: [id number]     Arrival date & time 01/30/13  1513 History   First MD Initiated Contact with Patient 01/30/13 1533     Chief Complaint  Patient presents with  . Shortness of Breath  . Chest Pain  . Fatigue    HPI Pt was seen at 1600. Per EMS, pt and family, c/o gradual onset and persistence of one episode of chest "pain" that began today PTA. Pt states she developed mid-sternal chest "pain" that radiated into her left shoulder and left back. Has been associated with SOB and generalized weakness. States the generalized weakness has been worsening of the past 2 weeks. Pt states exertion and ambulation worsen her symptoms. Denies palpitations, no cough, no abd pain, no N/V/D. EMS gave ASA and ntg SL with improvement in her symptoms. Pt and family states pt has "problems with her heart" but are not clear regarding specific diagnosis.     Past Medical History  Diagnosis Date  . ANEMIA, PERNICIOUS 09/10/2006  . CORONARY ARTERY DISEASE 11/07/2008    a. NSTEMI 2001 & STEMI 2008 managed medically. b. Takotsubo 06/2006. c. Cath 2009 -> med rx.  . CVA WITH LEFT HEMIPARESIS 07/22/2007    May, 2009, neurology decided aspirin and Plavix at that time.  . DERMATITIS 03/02/2007  . HYPERTENSION 07/15/2006  . MENINGIOMA 08/27/2006    Occipital meningioma, surgery, [facility], 2008 /  MRI, [place], June, 2011, no change in lesion, history believe the patient had a gamma knife treatment of a right occipital atypical meningioma  . OSTEOARTHRITIS 07/15/2006  . PULMONARY NODULE 01/14/2007    [facility], December, 2011  . Takotsubo syndrome 04/22/2008    MI, May, 2008, question takotsubo event  . THYROID NODULE 01/14/2007  . IBS (irritable bowel syndrome)   . DJD (degenerative joint disease)     L shoulder impingement  . Ejection fraction     EF 60%, echo, July, 2010, (  EF improved after tach a suitable event 2008)  . Subarachnoid hemorrhage following injury 2007    Subarachnoid hemorrhage secondary to a fall  December, 2007  . Bradycardia     August, 2013  . Family history of anesthesia complication     daughter has a hard time waking up  . H/O hiatal hernia   . Abnormal CT of the chest     Stable patchy/nodular and ground-glass opacities in the right upper lobe, While grossly unchanged, low grade adenocarcinoma remains possible.  . Stroke    Past Surgical History  Procedure Laterality Date  . Cholecystectomy    . Abdominal hysterectomy    . Hip surgery    . Brain tumor excision      Meningioma; treated at [facility] in 2008  . Shoulder surgery    . Joint replacement      rt THR   Family History  Problem Relation Age of Onset  . Heart attack Brother 45   History  Substance Use Topics  . Smoking status: Never Smoker   . Smokeless tobacco: Never Used  . Alcohol Use: No    Review of Systems ROS: Statement: All systems negative except as marked or noted in the HPI; Constitutional: Negative for fever and chills. +generalized weakness/fatigue ; ; Eyes: Negative for eye pain, redness and discharge. ; ; ENMT: Negative for ear pain, hoarseness, nasal congestion, sinus pressure and sore throat. ; ; Cardiovascular: +CP. Negative for palpitations, diaphoresis, and peripheral edema. ; ; Respiratory: +SOB. Negative for cough, wheezing and stridor. ; ; Gastrointestinal:  Negative for nausea, vomiting, diarrhea, abdominal pain, blood in stool, hematemesis, jaundice and rectal bleeding. . ; ; Genitourinary: Negative for dysuria, flank pain and hematuria. ; ; Musculoskeletal: Negative for back pain and neck pain. Negative for swelling and trauma.; ; Skin: Negative for pruritus, rash, abrasions, blisters, bruising and skin lesion.; ; Neuro: Negative for headache, lightheadedness and neck stiffness. Negative for altered level of consciousness , altered mental status, extremity weakness, paresthesias, involuntary movement, seizure and syncope.      Allergies  Atorvastatin; Influenza vaccines; Phenytoin;  Promethazine hcl; Sulfacetamide sodium; and Sulfamethoxazole  Home Medications   Current Outpatient Rx  Name  Route  Sig  Dispense  Refill  . clopidogrel (PLAVIX) 75 MG tablet   Oral   Take 75 mg by mouth daily with breakfast.         . conjugated estrogens (PREMARIN) vaginal cream   Vaginal   Place 1 Applicatorful vaginally daily as needed (vaginal dryness).         . cyanocobalamin (,VITAMIN B-12,) 1000 MCG/ML injection   Intramuscular   Inject 1 mL (1,000 mcg total) into the muscle every 30 (thirty) days.   3 mL   3   . isosorbide mononitrate (IMDUR) 30 MG 24 hr tablet   Oral   Take 0.5 tablets (15 mg total) by mouth daily.   90 tablet   5   . methimazole (TAPAZOLE) 5 MG tablet   Oral   Take 5 mg by mouth See admin instructions. Take 5mg  only on Mon., Wed., and Fri.         . pravastatin (PRAVACHOL) 20 MG tablet   Oral   Take 20 mg by mouth daily. Every evening         . traMADol (ULTRAM) 50 MG tablet   Oral   Take 50 mg by mouth every 6 (six) hours as needed for pain. For pain.         Marland Kitchen triamcinolone ointment (KENALOG) 0.1 %   Topical   Apply 1 application topically 2 (two) times daily as needed. For dry skin.         Marland Kitchen nitroGLYCERIN (NITROSTAT) 0.4 MG SL tablet   Sublingual   Place 1 tablet (0.4 mg total) under the tongue every 5 (five) minutes as needed. For chest pain.   15 tablet   0    BP 116/70  Pulse 72  Resp 20  SpO2 100% Physical Exam 1605: Physical examination:  Nursing notes reviewed; Vital signs and O2 SAT reviewed;  Constitutional: Thin, frail, In no acute distress; Head:  Normocephalic, atraumatic; Eyes: EOMI, PERRL, No scleral icterus; ENMT: Mouth and pharynx normal, Mucous membranes dry; Neck: Supple, Full range of motion, No lymphadenopathy; Cardiovascular: Irregular rate and rhythm, No gallop; Respiratory: Breath sounds clear & equal bilaterally, No wheezes.  Speaking full sentences with ease, Normal respiratory  effort/excursion; Chest: Nontender, Movement normal; Abdomen: Soft, Nontender, Nondistended, Normal bowel sounds; Genitourinary: No CVA tenderness; Extremities: Pulses normal, No tenderness, No edema, No calf edema or asymmetry.; Neuro: AA&Ox3, Major CN grossly intact.  Speech clear. No gross focal motor or sensory deficits in extremities.; Skin: Color normal, Warm, Dry.   ED Course  Procedures   EKG Interpretation    Date/Time:  Saturday January 30 2013 15:21:50 EST Ventricular Rate:  78 PR Interval:    QRS Duration: 78 QT Interval:  437 QTC Calculation: 498 R Axis:   -25 Text Interpretation:  Normal sinus rhythm Multiple ventricular premature complexes Baseline wander Artifact  When compared with ECG of 01/16/2013, Premature ventricular complexes are now Present Confirmed by [facility]  MD, Nicholos Johns [phone]) on 01/30/2013 4:41:48 PM            MDM  MDM Reviewed: previous chart, nursing note and vitals Reviewed previous: labs, ECG, x-ray and CT scan Interpretation: labs, ECG, x-ray and CT scan     CBC WITH DIFFERENTIAL      Result Value Range   WBC 7.9  4.0 - 10.5 K/uL   RBC 3.95  3.87 - 5.11 MIL/uL   Hemoglobin 12.0  12.0 - 15.0 g/dL   HCT 96.0  45.4 - 09.8 %   MCV 91.6  78.0 - 100.0 fL   MCH 30.4  26.0 - 34.0 pg   MCHC 33.1  30.0 - 36.0 g/dL   RDW 11.9  14.7 - 82.9 %   Platelets 245  150 - 400 K/uL   Neutrophils Relative % 69  43 - 77 %   Neutro Abs 5.4  1.7 - 7.7 K/uL   Lymphocytes Relative 16  12 - 46 %   Lymphs Abs 1.2  0.7 - 4.0 K/uL   Monocytes Relative 13 (*) 3 - 12 %   Monocytes Absolute 1.0  0.1 - 1.0 K/uL   Eosinophils Relative 3  0 - 5 %   Eosinophils Absolute 0.2  0.0 - 0.7 K/uL   Basophils Relative 1  0 - 1 %   Basophils Absolute 0.0  0.0 - 0.1 K/uL  COMPREHENSIVE METABOLIC PANEL      Result Value Range   Sodium 139  135 - 145 mEq/L   Potassium 4.3  3.5 - 5.1 mEq/L   Chloride 102  96 - 112 mEq/L   CO2 27  19 - 32 mEq/L   Glucose, Bld 111 (*) 70 -  99 mg/dL   BUN 19  6 - 23 mg/dL   Creatinine, Ser 5.62  0.50 - 1.10 mg/dL   Calcium 9.0  8.4 - 13.0 mg/dL   Total Protein 6.7  6.0 - 8.3 g/dL   Albumin 3.8  3.5 - 5.2 g/dL   AST 19  0 - 37 U/L   ALT 11  0 - 35 U/L   Alkaline Phosphatase 101  39 - 117 U/L   Total Bilirubin 0.2 (*) 0.3 - 1.2 mg/dL   GFR calc non Af Amer 59 (*) >90 mL/min   GFR calc Af Amer 69 (*) >90 mL/min  LACTIC ACID, PLASMA      Result Value Range   Lactic Acid, Venous 2.0  0.5 - 2.2 mmol/L  TROPONIN I      Result Value Range   Troponin I <0.30  <0.30 ng/mL  PRO B NATRIURETIC PEPTIDE      Result Value Range   Pro B Natriuretic peptide (BNP) 604.2 (*) 0 - 450 pg/mL  URINALYSIS W MICROSCOPIC + REFLEX CULTURE      Result Value Range   Color, Urine YELLOW  YELLOW   APPearance CLOUDY (*) CLEAR   Specific Gravity, Urine 1.015  1.005 - 1.030   pH 6.0  5.0 - 8.0   Glucose, UA NEGATIVE  NEGATIVE mg/dL   Hgb urine dipstick NEGATIVE  NEGATIVE   Bilirubin Urine NEGATIVE  NEGATIVE   Ketones, ur NEGATIVE  NEGATIVE mg/dL   Protein, ur NEGATIVE  NEGATIVE mg/dL   Urobilinogen, UA 0.2  0.0 - 1.0 mg/dL   Nitrite NEGATIVE  NEGATIVE   Leukocytes, UA NEGATIVE  NEGATIVE   WBC,  UA 3-6  <3 WBC/hpf   Bacteria, UA FEW (*) RARE  T4, FREE      Result Value Range   Free T4 1.35  0.80 - 1.80 ng/dL  CBC      Result Value Range   WBC 7.0  4.0 - 10.5 K/uL   RBC 3.70 (*) 3.87 - 5.11 MIL/uL   Hemoglobin 11.3 (*) 12.0 - 15.0 g/dL   HCT 40.9 (*) 81.1 - 91.4 %   MCV 91.6  78.0 - 100.0 fL   MCH 30.5  26.0 - 34.0 pg   MCHC 33.3  30.0 - 36.0 g/dL   RDW 78.2  95.6 - 21.3 %   Platelets 193  150 - 400 K/uL  TROPONIN I      Result Value Range   Troponin I <0.30  <0.30 ng/mL   Dg Chest 2 View 01/30/2013   CLINICAL DATA:  Mid chest pain. Shortness of breath. Prior history of MI.  EXAM: CHEST  2 VIEW  COMPARISON:  01/16/2013, 09/20/2012.  PET-CT 01/09/2010.  FINDINGS: Cardiac silhouette moderately enlarged but stable. Thoracic aorta  tortuous and atherosclerotic, unchanged. Small hiatal hernia, unchanged. Hilar and mediastinal contours otherwise unremarkable. Lungs hyperinflated with emphysematous changes in the upper lobes, unchanged. Lungs clear. Bronchovascular markings normal. Pulmonary vascularity normal. No visible pleural effusions. No pneumothorax. Mild degenerative changes involving the thoracic spine.  IMPRESSION: Stable cardiomegaly without pulmonary edema. COPD/emphysema. No acute cardiopulmonary disease. Stable small hiatal hernia.   Electronically Signed   By: Hulan Saas M.D.   On: 01/30/2013 18:01    Ct Angio Chest Pe W/cm &/or Wo Cm 01/30/2013   CLINICAL DATA:  Shortness of breath and chest pain at rest.  EXAM: CT ANGIOGRAPHY CHEST WITH CONTRAST  TECHNIQUE: Multidetector CT imaging of the chest was performed using the standard protocol during bolus administration of intravenous contrast. Multiplanar CT image reconstructions including MIPs were obtained to evaluate the vascular anatomy.  CONTRAST:  80mL OMNIPAQUE IOHEXOL 350 MG/ML SOLN  COMPARISON:  Chest CT, 11/17/2011  FINDINGS: No evidence of a pulmonary embolism. The heart is enlarged. There is mild dilation of the ascending aorta to 3.9 cm. No aortic dissection. There is a 9 mm short axis pretracheal lymph node above the aortic arch, stable. There are no mediastinal or hilar masses or pathologically enlarged lymph nodes.  There is a focal area of irregular opacity in the right upper lobe which is stable consistent with scarring. There is mild associated bronchiectasis. There is a small regular, 6 mm, nodule in the left upper lobe, with a smaller nodule below this in the left upper lobe, both of which are also stable. There are coarse reticular opacities that are most evident in the dependent lower lobes. This is similar to the prior exam likely combination of scarring and atelectasis. There are no areas of consolidation. No pleural effusion is seen.  Limited  visualization of the upper abdominal structures shows no acute findings.  There are 2 vertebral hemangiomas, T6 and T12. There are no osteoblastic or osteolytic lesions.  Review of the MIP images confirms the above findings.  IMPRESSION: 1. No evidence of a pulmonary embolism. 2. No acute findings.  No change from the prior chest CT. 3. Cardiomegaly. Stable areas of lung scarring as well as small stable pulmonary nodules.   Electronically Signed   By: Amie Portland M.D.   On: 01/30/2013 18:46    1900: Unsteady with ambulation and c/o feeling "weak," but pt did not desat. Continues  to deny CP currently.  T/C to [facility], case discussed, including:  HPI, pertinent PM/SHx, VS/PE, dx testing, ED course and treatment:  Agreeable to admit.  Laray Anger, DO 02/03/13 1315

## 2013-01-31 ENCOUNTER — Observation Stay (HOSPITAL_COMMUNITY): Payer: Medicare Other

## 2013-01-31 DIAGNOSIS — I10 Essential (primary) hypertension: Secondary | ICD-10-CM

## 2013-01-31 DIAGNOSIS — R0602 Shortness of breath: Secondary | ICD-10-CM

## 2013-01-31 DIAGNOSIS — I5181 Takotsubo syndrome: Secondary | ICD-10-CM

## 2013-01-31 LAB — TROPONIN I
Troponin I: <0.3 ng/mL (ref <0.30)
Troponin I: <0.3 ng/mL (ref <0.30)

## 2013-01-31 LAB — URINE CULTURE: Colony Count: 75000

## 2013-01-31 MED ORDER — IBUPROFEN 600 MG PO TABS
600.0000 mg | ORAL_TABLET | Freq: Three times a day (TID) | ORAL | Status: AC
  Administered 2013-01-31 (×3): 600 mg via ORAL
  Filled 2013-01-31 (×3): qty 1

## 2013-01-31 MED ORDER — CYCLOBENZAPRINE HCL 10 MG PO TABS
10.0000 mg | ORAL_TABLET | Freq: Three times a day (TID) | ORAL | Status: DC
  Administered 2013-01-31 – 2013-02-01 (×3): 10 mg via ORAL
  Filled 2013-01-31 (×6): qty 1

## 2013-01-31 NOTE — Consult Note (Signed)
Admit date: 01/30/2013 Referring Physician  Dr. Houston Siren Primary Physician Rogelia Boga, MD Primary Cardiologist  Dr. Myrtis Ser Reason for Consultation  shortness of breath, left arm pain, history of CAD  HPI: 79 year-old female with prior history of coronary artery disease status post old MI in 2001 as well as 2008 previously medically managed with last catheterization in 2009 revealing nonobstructive CAD with recommendation for medical management who is here with complaint of shortness of breath (deep breath was sometimes inhibited by pain in arm), left arm pain and tingling.  In May of 2008 on cardiac catheterization, apical ballooning was noted with ejection fraction mildly reduced at 45%. She was thought to have stress induced cardiomyopathy or Takotsubo syndrome. Nonetheless, her ejection fraction improved to 65% in 2013.  She has had prior stroke with left hemiparesis, left shoulder problems, left cervical facet and in management syndrome, hyperthyroidism currently on Tapazole.  She presented to the emergency room with neck pain, left arm pain as well as mild chest tightness which this time is accompanied by shortness of breath. She may have been slightly diaphoretic. The events may have lasted approximately 15 minutes and no nitroglycerin improvement was noted. When she arrived in the emergency room, she did not have any further symptoms.  Previously, she has been bradycardic hence, no use of beta blocker. She has been on Plavix therapy since her stroke.  Previous office notes have been reviewed by Dr. Myrtis Ser and on 09/12/11 he states that "it has become very clear that her pain is musculoskeletal ".    PMH:   Past Medical History  Diagnosis Date  . ANEMIA, PERNICIOUS 09/10/2006  . CORONARY ARTERY DISEASE 11/07/2008    a. NSTEMI 2001 & STEMI 2008 managed medically. b. Takotsubo 06/2006. c. Cath 2009 -> med rx.  . CVA WITH LEFT HEMIPARESIS 07/22/2007    May, 2009, neurology  decided aspirin and Plavix at that time.  . DERMATITIS 03/02/2007  . HYPERTENSION 07/15/2006  . MENINGIOMA 08/27/2006    Occipital meningioma, surgery, [facility], 2008 /  MRI, [facility], June, 2011, no change in lesion, history believe the patient had a gamma knife treatment of a right occipital atypical meningioma  . OSTEOARTHRITIS 07/15/2006  . PULMONARY NODULE 01/14/2007    [facility], December, 2011  . Takotsubo syndrome 04/22/2008    MI, May, 2008, question takotsubo event  . THYROID NODULE 01/14/2007  . IBS (irritable bowel syndrome)   . DJD (degenerative joint disease)     L shoulder impingement  . Ejection fraction     EF 60%, echo, July, 2010, (  EF improved after tach a suitable event 2008)  . Subarachnoid hemorrhage following injury 2007    Subarachnoid hemorrhage secondary to a fall December, 2007  . Bradycardia     August, 2013  . Family history of anesthesia complication     daughter has a hard time waking up  . H/O hiatal hernia   . Abnormal CT of the chest     Stable patchy/nodular and ground-glass opacities in the right upper lobe, While grossly unchanged, low grade adenocarcinoma remains possible.  . Stroke     PSH:   Past Surgical History  Procedure Laterality Date  . Cholecystectomy    . Abdominal hysterectomy    . Hip surgery    . Brain tumor excision      Meningioma; treated at [facility] in 2008  . Shoulder surgery    . Joint replacement      rt THR  Allergies:  Atorvastatin; Influenza vaccines; Phenytoin; Promethazine hcl; Sulfacetamide sodium; and Sulfamethoxazole Prior to Admit Meds:   Prior to Admission medications   Medication Sig Start Date End Date Taking? Authorizing Provider  clopidogrel (PLAVIX) 75 MG tablet Take 75 mg by mouth daily with breakfast.   Yes Historical Provider, MD  conjugated estrogens (PREMARIN) vaginal cream Place 1 Applicatorful vaginally daily as needed (vaginal dryness).   Yes Historical Provider, MD  cyanocobalamin (,VITAMIN B-12,)  1000 MCG/ML injection Inject 1 mL (1,000 mcg total) into the muscle every 30 (thirty) days. 01/05/13  Yes Gordy Savers, MD  isosorbide mononitrate (IMDUR) 30 MG 24 hr tablet Take 0.5 tablets (15 mg total) by mouth daily. 05/08/12  Yes Gordy Savers, MD  methimazole (TAPAZOLE) 5 MG tablet Take 5 mg by mouth See admin instructions. Take 5mg  only on Mon., Wed., and Fri.   Yes Historical Provider, MD  pravastatin (PRAVACHOL) 20 MG tablet Take 20 mg by mouth daily. Every evening   Yes Historical Provider, MD  traMADol (ULTRAM) 50 MG tablet Take 50 mg by mouth every 6 (six) hours as needed for pain. For pain. 03/17/12  Yes Gordy Savers, MD  triamcinolone ointment (KENALOG) 0.1 % Apply 1 application topically 2 (two) times daily as needed. For dry skin.   Yes Historical Provider, MD  nitroGLYCERIN (NITROSTAT) 0.4 MG SL tablet Place 1 tablet (0.4 mg total) under the tongue every 5 (five) minutes as needed. For chest pain. 04/30/12   Vassie Loll, MD   Fam HX:    Family History  Problem Relation Age of Onset  . Heart attack Brother 99   Social HX:    History   Social History  . Marital Status: Widowed    Spouse Name: N/A    Number of Children: 4  . Years of Education: N/A   Occupational History  .     Social History Main Topics  . Smoking status: Never Smoker   . Smokeless tobacco: Never Used  . Alcohol Use: No  . Drug Use: No  . Sexual Activity: No   Other Topics Concern  . Not on file   Social History Narrative   Widowed. Lives in [place], [place] with daughter.      ROS:  Denies any new strokelike symptoms, no fevers, no chills, no rashes, no bleeding, no orthopnea. Positive for left neck pain, left arm tingling. All 11 ROS were addressed and are negative except what is stated in the HPI   Physical Exam: Blood pressure 120/52, pulse 61, temperature 98.5 F (36.9 C), temperature source Oral, resp. rate 18, height 5\' 4"  (1.626 m), weight 117 lb 14.4 oz (53.479 kg),  SpO2 96.00%.   General: Elderly, in no acute distress, thin Head: Eyes PERRLA, No xanthomas.   Normal cephalic and atramatic  Lungs:   Clear bilaterally to auscultation and percussion. Normal respiratory effort. No wheezes, no rales. Heart:   HRRR S1 S2 Pulses are 2+ & equal. No murmur, rubs, gallops.  No carotid bruit. No JVD.  No abdominal bruits.  Abdomen: Bowel sounds are positive, abdomen soft and non-tender without masses. No hepatosplenomegaly. Msk:  Back normal. Normal strength and tone for age. Extremities:  No clubbing, cyanosis or edema.  DP +1 Neuro: Alert and oriented X 3, non-focal, MAE x 4 GU: Deferred Rectal: Deferred Psych:  Good affect, responds appropriately      Labs: Lab Results  Component Value Date   WBC 7.0 01/30/2013   HGB 11.3* 01/30/2013  HCT 33.9* 01/30/2013   MCV 91.6 01/30/2013   PLT 193 01/30/2013     Recent Labs Lab 01/30/13 1627 01/30/13 2320  NA 139  --   K 4.3  --   CL 102  --   CO2 27  --   BUN 19  --   CREATININE 0.85 0.80  CALCIUM 9.0  --   PROT 6.7  --   BILITOT 0.2*  --   ALKPHOS 101  --   ALT 11  --   AST 19  --   GLUCOSE 111*  --     Recent Labs  01/30/13 1627 01/30/13 2320 01/31/13 0340  TROPONINI <0.30 <0.30 <0.30   Lab Results  Component Value Date   CHOL 142 03/06/2011   HDL 52 03/06/2011   LDLCALC 68 03/06/2011   TRIG 112 03/06/2011   Lab Results  Component Value Date   DDIMER 1.96* 11/17/2011     Radiology:  Dg Chest 2 View  01/30/2013   CLINICAL DATA:  Mid chest pain. Shortness of breath. Prior history of MI.  EXAM: CHEST  2 VIEW  COMPARISON:  01/16/2013, 09/20/2012.  PET-CT 01/09/2010.  FINDINGS: Cardiac silhouette moderately enlarged but stable. Thoracic aorta tortuous and atherosclerotic, unchanged. Small hiatal hernia, unchanged. Hilar and mediastinal contours otherwise unremarkable. Lungs hyperinflated with emphysematous changes in the upper lobes, unchanged. Lungs clear. Bronchovascular markings  normal. Pulmonary vascularity normal. No visible pleural effusions. No pneumothorax. Mild degenerative changes involving the thoracic spine.  IMPRESSION: Stable cardiomegaly without pulmonary edema. COPD/emphysema. No acute cardiopulmonary disease. Stable small hiatal hernia.   Electronically Signed   By: Hulan Saas M.D.   On: 01/30/2013 18:01   Ct Angio Chest Pe W/cm &/or Wo Cm  01/30/2013   CLINICAL DATA:  Shortness of breath and chest pain at rest.  EXAM: CT ANGIOGRAPHY CHEST WITH CONTRAST  TECHNIQUE: Multidetector CT imaging of the chest was performed using the standard protocol during bolus administration of intravenous contrast. Multiplanar CT image reconstructions including MIPs were obtained to evaluate the vascular anatomy.  CONTRAST:  80mL OMNIPAQUE IOHEXOL 350 MG/ML SOLN  COMPARISON:  Chest CT, 11/17/2011  FINDINGS: No evidence of a pulmonary embolism. The heart is enlarged. There is mild dilation of the ascending aorta to 3.9 cm. No aortic dissection. There is a 9 mm short axis pretracheal lymph node above the aortic arch, stable. There are no mediastinal or hilar masses or pathologically enlarged lymph nodes.  There is a focal area of irregular opacity in the right upper lobe which is stable consistent with scarring. There is mild associated bronchiectasis. There is a small regular, 6 mm, nodule in the left upper lobe, with a smaller nodule below this in the left upper lobe, both of which are also stable. There are coarse reticular opacities that are most evident in the dependent lower lobes. This is similar to the prior exam likely combination of scarring and atelectasis. There are no areas of consolidation. No pleural effusion is seen.  Limited visualization of the upper abdominal structures shows no acute findings.  There are 2 vertebral hemangiomas, T6 and T12. There are no osteoblastic or osteolytic lesions.  Review of the MIP images confirms the above findings.  IMPRESSION: 1. No  evidence of a pulmonary embolism. 2. No acute findings.  No change from the prior chest CT. 3. Cardiomegaly. Stable areas of lung scarring as well as small stable pulmonary nodules.   Electronically Signed   By: Renard Hamper.D.  On: 01/30/2013 18:46   Personally viewed.  EKG:  01/30/13 at 1521-sinus rhythm with 2 PVCs noted, nonspecific ST changes, baseline wander. Other than PVCs, there is no specific change when compared care to prior EKG on 01/16/13. Personally viewed.   Echocardiogram: 11/17/11: - Left ventricle: The cavity size was normal. Systolic function was vigorous. The estimated ejection fraction was in the range of 65% to 70%. Although no diagnostic regional wall motion abnormality was identified, this possibility cannot be completely excluded on the basis of this study. Doppler parameters are consistent with abnormal left ventricular relaxation (grade 1 diastolic dysfunction). - Pulmonary arteries: Systolic pressure was mildly increased. PA peak pressure: 32mm Hg (S).  Cardiac catheterization: 12/10/07: -Nonobstructive CAD (30% mid LAD, 40% ramus, 30% circumflex, 30% mid right) -Low normal left ventricular systolic dysfunction -Noncardiac etiology of chest pain  ASSESSMENT/PLAN:    79 year old female with nonobstructive coronary artery disease, most recent ejection fraction normal at 65% here with atypical chest discomfort/left arm discomfort.  1. Atypical chest pain-currently reassuring troponins. After careful review of history and examination, findings are suggestive of musculoskeletal discomfort. No further cardiac workup indicated.  2. Secondary pulmonary hypertension-mild with minimally elevated pulmonary pressures secondary from diastolic dysfunction. Continue treatment of hypertension.  3. Nonobstructive coronary artery disease-continue with aggressive secondary prevention. Plavix. Statin. She did not tolerate beta blocker in the past due to bradycardia.  4.  History of stroke-aggressive secondary prevention. Plavix.  OK with DC from CV standpoint.   Donato Schultz, MD  01/31/2013  4:21 PM

## 2013-01-31 NOTE — Progress Notes (Signed)
Utilization Review completed.  

## 2013-01-31 NOTE — Progress Notes (Signed)
[facility] PROGRESS NOTE  [name] WUX:[id number] DOB: [birth year] DOA: 01/30/2013 PCP: Rogelia Boga, MD  Assessment/Plan:  Chest pain- patient is /p NSTEMI and STEMI in 2001 and 2008 respectively, medically managed, most recent cath in 2009 for chest pain revealed nonobstructive CAD with recommended medical management. Takotsubo syndrome (on 06/2006 cardiac cath; apical ballooning, EF 45%, MR.). Cardiac enzymes x 3 are negative. Will get cardiology consultation. Continue plavix, imdur,   Left sided chest wall pain- ? Muscular vs rib fracture, will order flexeril and ibuprofen. Will obtain rib series xray.  Hyperlipidemia- Continue with Pravachol.    Code Status: Full code Family Communication: Discussed with daughter at bedside Disposition Plan: *Home when satble   Consultants:  Cardiology  Procedures:  *None  Antibiotics:  *None  HPI/Subjective: Patient seen and examined, admitted with chest pain, still complains of left sided chest pain going on for long time.  Objective: Filed Vitals:   01/31/13 1417  BP: 120/52  Pulse: 61  Temp: 98.5 F (36.9 C)  Resp: 18    Intake/Output Summary (Last 24 hours) at 01/31/13 1432 Last data filed at 01/31/13 1300  Gross per 24 hour  Intake    600 ml  Output      3 ml  Net    597 ml   Filed Weights   01/30/13 2120  Weight: 53.479 kg (117 lb 14.4 oz)    Exam:  Physical Exam: Head: Normocephalic, atraumatic.  Eyes: No signs of jaundice, EOMI Nose: Mucous membranes dry.  Throat: Oropharynx nonerythematous, no exudate appreciated.  Neck: supple,No deformities, masses, or tenderness noted. Lungs: Normal respiratory effort. B/L Clear to auscultation, no crackles or wheezes.  Heart: Regular RR. S1 and S2 normal  Abdomen: BS normoactive. Soft, Nondistended, non-tender.  Extremities: No pretibial edema, no erythema   Data Reviewed: Basic Metabolic Panel:  Recent Labs Lab 01/30/13 1627  01/30/13 2320  NA 139  --   K 4.3  --   CL 102  --   CO2 27  --   GLUCOSE 111*  --   BUN 19  --   CREATININE 0.85 0.80  CALCIUM 9.0  --    Liver Function Tests:  Recent Labs Lab 01/30/13 1627  AST 19  ALT 11  ALKPHOS 101  BILITOT 0.2*  PROT 6.7  ALBUMIN 3.8   No results found for this basename: LIPASE, AMYLASE,  in the last 168 hours No results found for this basename: AMMONIA,  in the last 168 hours CBC:  Recent Labs Lab 01/30/13 1627 01/30/13 2320  WBC 7.9 7.0  NEUTROABS 5.4  --   HGB 12.0 11.3*  HCT 36.2 33.9*  MCV 91.6 91.6  PLT 245 193   Cardiac Enzymes:  Recent Labs Lab 01/30/13 1627 01/30/13 2320 01/31/13 0340  TROPONINI <0.30 <0.30 <0.30   BNP (last 3 results)  Recent Labs  01/16/13 1554 01/30/13 1627  PROBNP 373.9 604.2*   CBG: No results found for this basename: GLUCAP,  in the last 168 hours  No results found for this or any previous visit (from the past 240 hour(s)).   Studies: Dg Chest 2 View  01/30/2013   CLINICAL DATA:  Mid chest pain. Shortness of breath. Prior history of MI.  EXAM: CHEST  2 VIEW  COMPARISON:  01/16/2013, 09/20/2012.  PET-CT 01/09/2010.  FINDINGS: Cardiac silhouette moderately enlarged but stable. Thoracic aorta tortuous and atherosclerotic, unchanged. Small hiatal hernia, unchanged. Hilar and mediastinal contours otherwise unremarkable. Lungs hyperinflated with emphysematous changes in the  upper lobes, unchanged. Lungs clear. Bronchovascular markings normal. Pulmonary vascularity normal. No visible pleural effusions. No pneumothorax. Mild degenerative changes involving the thoracic spine.  IMPRESSION: Stable cardiomegaly without pulmonary edema. COPD/emphysema. No acute cardiopulmonary disease. Stable small hiatal hernia.   Electronically Signed   By: Hulan Saas M.D.   On: 01/30/2013 18:01   Ct Angio Chest Pe W/cm &/or Wo Cm  01/30/2013   CLINICAL DATA:  Shortness of breath and chest pain at rest.  EXAM: CT  ANGIOGRAPHY CHEST WITH CONTRAST  TECHNIQUE: Multidetector CT imaging of the chest was performed using the standard protocol during bolus administration of intravenous contrast. Multiplanar CT image reconstructions including MIPs were obtained to evaluate the vascular anatomy.  CONTRAST:  80mL OMNIPAQUE IOHEXOL 350 MG/ML SOLN  COMPARISON:  Chest CT, 11/17/2011  FINDINGS: No evidence of a pulmonary embolism. The heart is enlarged. There is mild dilation of the ascending aorta to 3.9 cm. No aortic dissection. There is a 9 mm short axis pretracheal lymph node above the aortic arch, stable. There are no mediastinal or hilar masses or pathologically enlarged lymph nodes.  There is a focal area of irregular opacity in the right upper lobe which is stable consistent with scarring. There is mild associated bronchiectasis. There is a small regular, 6 mm, nodule in the left upper lobe, with a smaller nodule below this in the left upper lobe, both of which are also stable. There are coarse reticular opacities that are most evident in the dependent lower lobes. This is similar to the prior exam likely combination of scarring and atelectasis. There are no areas of consolidation. No pleural effusion is seen.  Limited visualization of the upper abdominal structures shows no acute findings.  There are 2 vertebral hemangiomas, T6 and T12. There are no osteoblastic or osteolytic lesions.  Review of the MIP images confirms the above findings.  IMPRESSION: 1. No evidence of a pulmonary embolism. 2. No acute findings.  No change from the prior chest CT. 3. Cardiomegaly. Stable areas of lung scarring as well as small stable pulmonary nodules.   Electronically Signed   By: Amie Portland M.D.   On: 01/30/2013 18:46    Scheduled Meds: . clopidogrel  75 mg Oral Q breakfast  . [START ON 02/20/2013] cyanocobalamin  1,000 mcg Intramuscular Q30 days  . cyclobenzaprine  10 mg Oral TID  . docusate sodium  100 mg Oral BID  . enoxaparin  (LOVENOX) injection  40 mg Subcutaneous Q24H  . ibuprofen  600 mg Oral TID  . isosorbide mononitrate  15 mg Oral Daily  . nitroGLYCERIN  1 inch Topical Q6H  . pravastatin  20 mg Oral q1800  . sodium chloride  3 mL Intravenous Q12H   Continuous Infusions:   Principal Problem:   Unstable angina pectoris Active Problems:   ANEMIA, PERNICIOUS   HYPERTENSION   CORONARY ARTERY DISEASE   CVA WITH LEFT HEMIPARESIS   GERD (gastroesophageal reflux disease)   Shortness of breath    Time spent: 25 min    [facility] S  [facility] Pager [phone]*. If 7PM-7AM, please contact night-coverage at [email or web address], password [facility] 01/31/2013, 2:32 PM  LOS: 1 day

## 2013-02-01 DIAGNOSIS — R0789 Other chest pain: Secondary | ICD-10-CM

## 2013-02-01 DIAGNOSIS — E785 Hyperlipidemia, unspecified: Secondary | ICD-10-CM

## 2013-02-01 DIAGNOSIS — I059 Rheumatic mitral valve disease, unspecified: Secondary | ICD-10-CM

## 2013-02-01 MED ORDER — DSS 100 MG PO CAPS: 100.0000 mg | ORAL_CAPSULE | Freq: Two times a day (BID) | ORAL | Status: DC

## 2013-02-01 MED ORDER — CYCLOBENZAPRINE HCL 10 MG PO TABS: 10.0000 mg | ORAL_TABLET | Freq: Three times a day (TID) | ORAL | Status: DC

## 2013-02-01 NOTE — Progress Notes (Signed)
Pt provided with dc instructions and education. Pt verbalized understanding. Pt nor family have questions at this time. IV removed with tip intact. Heart monitor cleaned and returned to front. Levonne Spiller, RN

## 2013-02-01 NOTE — Progress Notes (Signed)
  Echocardiogram 2D Echocardiogram has been performed.  [name] 02/01/2013, 10:45 AM

## 2013-02-01 NOTE — Progress Notes (Signed)
Nutrition Brief Note  Patient identified on the Malnutrition Screening Tool (MST) Report for weight loss of unknown amount. Per review of usual weights, weight has fluctuated from 116-120 lb over the past 9 months with no significant weight changes noted.  Wt Readings from Last 15 Encounters:  01/30/13 117 lb 14.4 oz (53.479 kg)  01/16/13 114 lb 4.8 oz (51.846 kg)  01/05/13 117 lb (53.071 kg)  12/24/12 119 lb 1.6 oz (54.023 kg)  11/23/12 118 lb (53.524 kg)  10/19/12 116 lb (52.617 kg)  10/06/12 117 lb (53.071 kg)  09/21/12 116 lb (52.617 kg)  09/17/12 119 lb (53.978 kg)  07/23/12 117 lb (53.071 kg)  07/13/12 119 lb (53.978 kg)  06/22/12 120 lb (54.432 kg)  05/08/12 120 lb (54.432 kg)  04/29/12 116 lb 14.4 oz (53.025 kg)  04/21/12 119 lb (53.978 kg)    Body mass index is 20.23 kg/(m^2). Patient meets criteria for normal weight based on current BMI.   Current diet order is heart healthy, patient is consuming approximately 50-60% of meals at this time. Labs and medications reviewed.   No nutrition interventions warranted at this time. If nutrition issues arise, please consult RD.   Joaquin Courts, RD, LDN, CNSC Pager [phone] After Hours Pager [phone]

## 2013-02-01 NOTE — Discharge Summary (Addendum)
Physician Discharge Summary  [name] ZOX:[id number] DOB: [birth year] DOA: 01/30/2013  PCP: Rogelia Boga, MD  Admit date: 01/30/2013 Discharge date: 02/01/2013  Time spent: *50 minutes  Recommendations for Outpatient Follow-up:  1. *Follow up PCP in 2 weeks  Discharge Diagnoses:  Principal Problem:   Unstable angina pectoris Active Problems:   ANEMIA, PERNICIOUS   HYPERTENSION   CORONARY ARTERY DISEASE   CVA WITH LEFT HEMIPARESIS   GERD (gastroesophageal reflux disease)   Shortness of breath   Discharge Condition: Stable  Diet recommendation: Low salt diet  Filed Weights   01/30/13 2120  Weight: 53.479 kg (117 lb 14.4 oz)    History of present illness:  79 y.o. female with hx of 79 year old female with a history of CAD (s/p NSTEMI and STEMI in 2001 and 2008 respectively, medically managed, most recent cath in 2009 for chest pain revealed nonobstructive CAD with recommended medical management), Takotsubo syndrome (on 06/2006 cardiac cath; apical ballooning, EF 45%, MR), CVA with left hemiparesis, pulmonary nodule, left shoulder orthopedic problems (left cervical facet and impingement syndromes), hyperthyroidism on tapazole, HTN, and HLD came to the hospital again for left neck pain with radiation to her left arm, accompanied by SOB this time. She also has mild chest tightness. She did not have any nausea or vomiting, but was a little diaphoresis. These events lasted about 15 minutes. She was given NTG with improvements. In the ER, she did not have any symptomology. In the past, she had bradycardia precluding the use of betablocker. In general, she is functional and lives alone. She has no abdominal pain, black or bloody stool. She has been on Plavix since her CVA, and has had no cardiac stent nor prior CABG. There is no weakness on her left upper extremity. Work up included a clear CXR, negative CTPA, negative troponin, normal LFTs, normal WBC and Hb. EKG showed NSR,  unifocal PVC, and no acute ST-T changes. Hospitalist was asked to admit her for further evalaution and tx.   Hospital Course:  Chest pain- patient is /p NSTEMI and STEMI in 2001 and 2008 respectively, medically managed, most recent cath in 2009 for chest pain revealed nonobstructive CAD with recommended medical management. Takotsubo syndrome (on 06/2006 cardiac cath; apical ballooning, EF 45%, MR.). Cardiac enzymes x 3 are negative. Cardiology was consulted , and recommneded  Medical management only. Continue plavix, imdur,   Left sided chest wall pain- ? Muscular vs rib fracture, was started on  flexeril and ibuprofen. Rib series xray showed no acute fracture. Has improved with Flexeril, will send her home on Flexeril.   Hyperlipidemia- Continue with Pravachol.   Secondary pulmonary hypertension-mild with minimally elevated pulmonary pressures secondary from diastolic dysfunction.2 D echo done, will follow up cardiologist. Could not tolerate beta blockers in past.  Addendum-02/05/13 Called and discussed with patient's daughter that Flexeril can cause drowsiness and that the patient should not drive, operate heavy machinery, climb ladders as this medication can cause drowsiness. Also recommended not to exceed total 10 days of therapy, she can follow PCP for further recommendations. Also told that she can take it PRN for chest wall pain.  Procedures:  2 d echo- results are pending.  Consultations:  cardiology  Discharge Exam: Filed Vitals:   02/01/13 0538  BP: 136/71  Pulse: 61  Temp: 97.3 F (36.3 C)  Resp: 18    General: appear in no acute distress Cardiovascular: s1s2 rrr Respiratory: clear bilaterally  Discharge Instructions  Discharge Orders   Future Appointments  Provider Department Dept Phone   02/03/2013 8:00 AM Gordy Savers, MD [facility] [place] [phone]   02/08/2013 4:00 PM Luis Abed, MD [facility] [place] Office [phone]    03/11/2013 10:15 AM Romero Belling, MD [facility] [phone]   05/24/2013 10:00 AM Gordy Savers, MD [facility] [place] [phone]   Future Orders Complete By Expires   Diet - low sodium heart healthy  As directed    Increase activity slowly  As directed        Medication List         clopidogrel 75 MG tablet  Commonly known as:  PLAVIX  Take 75 mg by mouth daily with breakfast.     conjugated estrogens vaginal cream  Commonly known as:  PREMARIN  Place 1 Applicatorful vaginally daily as needed (vaginal dryness).     cyanocobalamin 1000 MCG/ML injection  Commonly known as:  (VITAMIN B-12)  Inject 1 mL (1,000 mcg total) into the muscle every 30 (thirty) days.     cyclobenzaprine 10 MG tablet  Commonly known as:  FLEXERIL  Take 1 tablet (10 mg total) by mouth 3 (three) times daily.     DSS 100 MG Caps  Take 100 mg by mouth 2 (two) times daily.     isosorbide mononitrate 30 MG 24 hr tablet  Commonly known as:  IMDUR  Take 0.5 tablets (15 mg total) by mouth daily.     methimazole 5 MG tablet  Commonly known as:  TAPAZOLE  Take 5 mg by mouth See admin instructions. Take 5mg  only on Mon., Wed., and Fri.     nitroGLYCERIN 0.4 MG SL tablet  Commonly known as:  NITROSTAT  Place 1 tablet (0.4 mg total) under the tongue every 5 (five) minutes as needed. For chest pain.     pravastatin 20 MG tablet  Commonly known as:  PRAVACHOL  Take 20 mg by mouth daily. Every evening     traMADol 50 MG tablet  Commonly known as:  ULTRAM  Take 50 mg by mouth every 6 (six) hours as needed for pain. For pain.     triamcinolone ointment 0.1 %  Commonly known as:  KENALOG  Apply 1 application topically 2 (two) times daily as needed. For dry skin.       Allergies  Allergen Reactions  . Atorvastatin Other (See Comments)    Raises liver enzymes (takes pravastatin at home)  . Influenza Vaccines   . Phenytoin Other (See Comments)    Blood clots  .  Promethazine Hcl Other (See Comments)    Severe sedation  . Sulfacetamide Sodium     REACTION: sulfa  . Sulfamethoxazole     REACTION: unspecified      The results of significant diagnostics from this hospitalization (including imaging, microbiology, ancillary and laboratory) are listed below for reference.    Significant Diagnostic Studies: Dg Chest 2 View  01/30/2013   CLINICAL DATA:  Mid chest pain. Shortness of breath. Prior history of MI.  EXAM: CHEST  2 VIEW  COMPARISON:  01/16/2013, 09/20/2012.  PET-CT 01/09/2010.  FINDINGS: Cardiac silhouette moderately enlarged but stable. Thoracic aorta tortuous and atherosclerotic, unchanged. Small hiatal hernia, unchanged. Hilar and mediastinal contours otherwise unremarkable. Lungs hyperinflated with emphysematous changes in the upper lobes, unchanged. Lungs clear. Bronchovascular markings normal. Pulmonary vascularity normal. No visible pleural effusions. No pneumothorax. Mild degenerative changes involving the thoracic spine.  IMPRESSION: Stable cardiomegaly without pulmonary edema. COPD/emphysema. No acute cardiopulmonary  disease. Stable small hiatal hernia.   Electronically Signed   By: Hulan Saas M.D.   On: 01/30/2013 18:01   Dg Chest 2 View  01/16/2013   CLINICAL DATA:  Right-sided chest pain and bilateral arm numbness for 5 hr. History of stroke.  EXAM: CHEST  2 VIEW  COMPARISON:  09/20/2012.  FINDINGS: COPD is present with hyperinflation. Right upper lobe scarring is stable. No focal infiltrates or congestive failure. There is mild cardiac enlargement. Calcified tortuous aorta. No effusion. Osseous demineralization. Previous right shoulder surgery.  IMPRESSION: COPD, no active disease.  Stable chest.   Electronically Signed   By: Davonna Belling M.D.   On: 01/16/2013 17:13   Dg Ribs Unilateral Left  01/31/2013   CLINICAL DATA:  Question rib fracture  EXAM: LEFT RIBS - 2 VIEW  COMPARISON:  01/30/2013  FINDINGS: No fracture or other bone  lesions are seen involving the ribs.  IMPRESSION: Negative.   Electronically Signed   By: Marlan Palau M.D.   On: 01/31/2013 19:25   Ct Angio Chest Pe W/cm &/or Wo Cm  01/30/2013   CLINICAL DATA:  Shortness of breath and chest pain at rest.  EXAM: CT ANGIOGRAPHY CHEST WITH CONTRAST  TECHNIQUE: Multidetector CT imaging of the chest was performed using the standard protocol during bolus administration of intravenous contrast. Multiplanar CT image reconstructions including MIPs were obtained to evaluate the vascular anatomy.  CONTRAST:  80mL OMNIPAQUE IOHEXOL 350 MG/ML SOLN  COMPARISON:  Chest CT, 11/17/2011  FINDINGS: No evidence of a pulmonary embolism. The heart is enlarged. There is mild dilation of the ascending aorta to 3.9 cm. No aortic dissection. There is a 9 mm short axis pretracheal lymph node above the aortic arch, stable. There are no mediastinal or hilar masses or pathologically enlarged lymph nodes.  There is a focal area of irregular opacity in the right upper lobe which is stable consistent with scarring. There is mild associated bronchiectasis. There is a small regular, 6 mm, nodule in the left upper lobe, with a smaller nodule below this in the left upper lobe, both of which are also stable. There are coarse reticular opacities that are most evident in the dependent lower lobes. This is similar to the prior exam likely combination of scarring and atelectasis. There are no areas of consolidation. No pleural effusion is seen.  Limited visualization of the upper abdominal structures shows no acute findings.  There are 2 vertebral hemangiomas, T6 and T12. There are no osteoblastic or osteolytic lesions.  Review of the MIP images confirms the above findings.  IMPRESSION: 1. No evidence of a pulmonary embolism. 2. No acute findings.  No change from the prior chest CT. 3. Cardiomegaly. Stable areas of lung scarring as well as small stable pulmonary nodules.   Electronically Signed   By: Amie Portland  M.D.   On: 01/30/2013 18:46    Microbiology: Recent Results (from the past 240 hour(s))  URINE CULTURE     Status: None   Collection Time    01/30/13  4:29 PM      Result Value Range Status   Specimen Description URINE, CLEAN CATCH   Final   Special Requests NONE   Final   Culture  Setup Time     Final   Value: 01/30/2013 20:54     Performed at [facility] Count     Final   Value: 75,000 COLONIES/ML     Performed at [facility]  Culture     Final   Value: Multiple bacterial morphotypes present, none predominant. Suggest appropriate recollection if clinically indicated.     Performed at [facility]   Report Status 01/31/2013 FINAL   Final     Labs: Basic Metabolic Panel:  Recent Labs Lab 01/30/13 1627 01/30/13 2320  NA 139  --   K 4.3  --   CL 102  --   CO2 27  --   GLUCOSE 111*  --   BUN 19  --   CREATININE 0.85 0.80  CALCIUM 9.0  --    Liver Function Tests:  Recent Labs Lab 01/30/13 1627  AST 19  ALT 11  ALKPHOS 101  BILITOT 0.2*  PROT 6.7  ALBUMIN 3.8   No results found for this basename: LIPASE, AMYLASE,  in the last 168 hours No results found for this basename: AMMONIA,  in the last 168 hours CBC:  Recent Labs Lab 01/30/13 1627 01/30/13 2320  WBC 7.9 7.0  NEUTROABS 5.4  --   HGB 12.0 11.3*  HCT 36.2 33.9*  MCV 91.6 91.6  PLT 245 193   Cardiac Enzymes:  Recent Labs Lab 01/30/13 1627 01/30/13 2320 01/31/13 0340 01/31/13 2128  TROPONINI <0.30 <0.30 <0.30 <0.30   BNP: BNP (last 3 results)  Recent Labs  01/16/13 1554 01/30/13 1627  PROBNP 373.9 604.2*   CBG: No results found for this basename: GLUCAP,  in the last 168 hours     Signed:  Dale Strausser S  [facility] 02/01/2013, 10:50 AM

## 2013-02-03 ENCOUNTER — Encounter: Payer: Self-pay | Admitting: Internal Medicine

## 2013-02-03 ENCOUNTER — Ambulatory Visit (INDEPENDENT_AMBULATORY_CARE_PROVIDER_SITE_OTHER): Payer: Medicare Other | Admitting: Internal Medicine

## 2013-02-03 VITALS — BP 126/70 | HR 73 | Resp 18 | Ht 63.0 in | Wt 119.0 lb

## 2013-02-03 DIAGNOSIS — I10 Essential (primary) hypertension: Secondary | ICD-10-CM

## 2013-02-03 DIAGNOSIS — M1990 Unspecified osteoarthritis, unspecified site: Secondary | ICD-10-CM

## 2013-02-03 DIAGNOSIS — I2510 Atherosclerotic heart disease of native coronary artery without angina pectoris: Secondary | ICD-10-CM

## 2013-02-03 DIAGNOSIS — I69959 Hemiplegia and hemiparesis following unspecified cerebrovascular disease affecting unspecified side: Secondary | ICD-10-CM

## 2013-02-03 DIAGNOSIS — E785 Hyperlipidemia, unspecified: Secondary | ICD-10-CM

## 2013-02-03 DIAGNOSIS — R0789 Other chest pain: Secondary | ICD-10-CM

## 2013-02-03 MED ORDER — TRIAMCINOLONE ACETONIDE 0.1 % EX OINT: 1.0000 "application " | TOPICAL_OINTMENT | Freq: Two times a day (BID) | CUTANEOUS | Status: DC

## 2013-02-03 MED ORDER — TRAMADOL HCL 50 MG PO TABS: 50.0000 mg | ORAL_TABLET | Freq: Four times a day (QID) | ORAL | Status: DC | PRN

## 2013-02-03 MED ORDER — CYCLOBENZAPRINE HCL 10 MG PO TABS: 10.0000 mg | ORAL_TABLET | Freq: Three times a day (TID) | ORAL | Status: DC

## 2013-02-03 NOTE — Patient Instructions (Signed)
Limit your sodium (Salt) intake    It is important that you exercise regularly, at least 20 minutes 3 to 4 times per week.  If you develop chest pain or shortness of breath seek  medical attention.  Return in 3 months for follow-up  

## 2013-02-03 NOTE — Progress Notes (Signed)
Pre-visit discussion using our clinic review tool. No additional management support is needed unless otherwise documented below in the visit note.  

## 2013-02-03 NOTE — Progress Notes (Signed)
Subjective:    Patient ID: [name], female    DOB: 19-Dec-1922, 79 y.o.   MRN: [id number]  HPI  79 year old patient who is seen following 2 recent hospital discharge for atypical chest pain. Hospital records from both omissions reviewed. Since her discharge she has done fairly well today she complains of left-sided neck pain and upper back pain. She's had no further shortness of breath. She does have a history of coronary artery disease and prior non-ST segment elevated MI. She did have cardiology evaluation during her recent hospital admission. Medical problems include hypertension and dyslipidemia. She also has a history of cerebrovascular disease Doing well today accompanied by her daughter  Admit date: 01/30/2013  Discharge date: 02/01/2013  Time spent: *50 minutes  Recommendations for Outpatient Follow-up:  1. *Follow up PCP in 2 weeks Discharge Diagnoses:  Principal Problem:  Unstable angina pectoris  Active Problems:  ANEMIA, PERNICIOUS  HYPERTENSION  CORONARY ARTERY DISEASE  CVA WITH LEFT HEMIPARESIS  GERD (gastroesophageal reflux disease)  Shortness of breath  Discharge Condition: Stable  Diet recommendation: Low salt diet  Hospital records reviewed  Past Medical History  Diagnosis Date  . ANEMIA, PERNICIOUS 09/10/2006  . CORONARY ARTERY DISEASE 11/07/2008    a. NSTEMI 2001 & STEMI 2008 managed medically. b. Takotsubo 06/2006. c. Cath 2009 -> med rx.  . CVA WITH LEFT HEMIPARESIS 07/22/2007    May, 2009, neurology decided aspirin and Plavix at that time.  . DERMATITIS 03/02/2007  . HYPERTENSION 07/15/2006  . MENINGIOMA 08/27/2006    Occipital meningioma, surgery, [facility], 2008 /  MRI, [place], June, 2011, no change in lesion, history believe the patient had a gamma knife treatment of a right occipital atypical meningioma  . OSTEOARTHRITIS 07/15/2006  . PULMONARY NODULE 01/14/2007    [facility], December, 2011  . Takotsubo syndrome 04/22/2008    MI, May, 2008, question  takotsubo event  . THYROID NODULE 01/14/2007  . IBS (irritable bowel syndrome)   . DJD (degenerative joint disease)     L shoulder impingement  . Ejection fraction     EF 60%, echo, July, 2010, (  EF improved after tach a suitable event 2008)  . Subarachnoid hemorrhage following injury 2007    Subarachnoid hemorrhage secondary to a fall December, 2007  . Bradycardia     August, 2013  . Family history of anesthesia complication     daughter has a hard time waking up  . H/O hiatal hernia   . Abnormal CT of the chest     Stable patchy/nodular and ground-glass opacities in the right upper lobe, While grossly unchanged, low grade adenocarcinoma remains possible.  . Stroke     History   Social History  . Marital Status: Widowed    Spouse Name: N/A    Number of Children: 4  . Years of Education: N/A   Occupational History  .     Social History Main Topics  . Smoking status: Never Smoker   . Smokeless tobacco: Never Used  . Alcohol Use: No  . Drug Use: No  . Sexual Activity: No   Other Topics Concern  . Not on file   Social History Narrative   Widowed. Lives in [place], [place] with daughter.     Past Surgical History  Procedure Laterality Date  . Cholecystectomy    . Abdominal hysterectomy    . Hip surgery    . Brain tumor excision      Meningioma; [facility] at [facility] in 2008  .  Shoulder surgery    . Joint replacement      rt THR    Family History  Problem Relation Age of Onset  . Heart attack Brother 84    Allergies  Allergen Reactions  . Atorvastatin Other (See Comments)    Raises liver enzymes (takes pravastatin at home)  . Influenza Vaccines   . Phenytoin Other (See Comments)    Blood clots  . Promethazine Hcl Other (See Comments)    Severe sedation  . Sulfacetamide Sodium     REACTION: sulfa  . Sulfamethoxazole     REACTION: unspecified    Current Outpatient Prescriptions on File Prior to Visit  Medication Sig Dispense Refill  . clopidogrel  (PLAVIX) 75 MG tablet Take 75 mg by mouth daily with breakfast.      . conjugated estrogens (PREMARIN) vaginal cream Place 1 Applicatorful vaginally daily as needed (vaginal dryness).      . cyanocobalamin (,VITAMIN B-12,) 1000 MCG/ML injection Inject 1 mL (1,000 mcg total) into the muscle every 30 (thirty) days.  3 mL  3  . Docusate Sodium (DSS) 100 MG CAPS Take 100 mg by mouth 2 (two) times daily.  30 each  0  . isosorbide mononitrate (IMDUR) 30 MG 24 hr tablet Take 0.5 tablets (15 mg total) by mouth daily.  90 tablet  5  . methimazole (TAPAZOLE) 5 MG tablet Take 5 mg by mouth See admin instructions. Take 5mg  only on Mon., Wed., and Fri.      . nitroGLYCERIN (NITROSTAT) 0.4 MG SL tablet Place 1 tablet (0.4 mg total) under the tongue every 5 (five) minutes as needed. For chest pain.  15 tablet  0  . pravastatin (PRAVACHOL) 20 MG tablet Take 20 mg by mouth daily. Every evening       No current facility-administered medications on file prior to visit.    BP 126/70  Pulse 73  Resp 18  Ht 5\' 3"  (1.6 m)  Wt 119 lb (53.978 kg)  BMI 21.09 kg/m2  SpO2 96%     Review of Systems  Constitutional: Positive for fatigue.  HENT: Negative for congestion, dental problem, hearing loss, rhinorrhea, sinus pressure, sore throat and tinnitus.   Eyes: Negative for pain, discharge and visual disturbance.  Respiratory: Positive for shortness of breath. Negative for cough.   Cardiovascular: Positive for chest pain. Negative for palpitations and leg swelling.  Gastrointestinal: Negative for nausea, vomiting, abdominal pain, diarrhea, constipation, blood in stool and abdominal distention.  Genitourinary: Negative for dysuria, urgency, frequency, hematuria, flank pain, vaginal bleeding, vaginal discharge, difficulty urinating, vaginal pain and pelvic pain.  Musculoskeletal: Negative for arthralgias, gait problem and joint swelling.  Skin: Negative for rash.  Neurological: Positive for weakness, light-headedness  and numbness. Negative for dizziness, syncope, speech difficulty and headaches.  Hematological: Negative for adenopathy.  Psychiatric/Behavioral: Negative for behavioral problems, dysphoric mood and agitation. The patient is nervous/anxious.        Objective:   Physical Exam  Constitutional: She is oriented to person, place, and time. She appears well-developed and well-nourished.  HENT:  Head: Normocephalic.  Right Ear: External ear normal.  Left Ear: External ear normal.  Mouth/Throat: Oropharynx is clear and moist.  Eyes: Conjunctivae and EOM are normal. Pupils are equal, round, and reactive to light.  Neck: Normal range of motion. Neck supple. No thyromegaly present.  Cardiovascular: Normal rate, regular rhythm, normal heart sounds and intact distal pulses.   Pulmonary/Chest: Effort normal and breath sounds normal. She exhibits tenderness.  Abdominal: Soft. Bowel sounds are normal. She exhibits no mass. There is no tenderness.  Musculoskeletal: Normal range of motion.  Lymphadenopathy:    She has no cervical adenopathy.  Neurological: She is alert and oriented to person, place, and time.  Skin: Skin is warm and dry. No rash noted.  Psychiatric: She has a normal mood and affect. Her behavior is normal.          Assessment & Plan:   Status post hospital admission x2 for atypical chest pain. Stable at present Coronary artery disease Hypertension Cerebrovascular disease Dyslipidemia  We'll continue aggressive risk factor modification Recheck 3 months or as needed Medications updated

## 2013-02-07 ENCOUNTER — Encounter: Payer: Self-pay | Admitting: Cardiology

## 2013-02-08 ENCOUNTER — Encounter: Payer: Self-pay | Admitting: Cardiology

## 2013-02-08 ENCOUNTER — Ambulatory Visit (INDEPENDENT_AMBULATORY_CARE_PROVIDER_SITE_OTHER): Payer: Medicare Other | Admitting: Cardiology

## 2013-02-08 VITALS — BP 112/60 | HR 84 | Ht 63.0 in | Wt 118.0 lb

## 2013-02-08 DIAGNOSIS — I5181 Takotsubo syndrome: Secondary | ICD-10-CM

## 2013-02-08 DIAGNOSIS — R079 Chest pain, unspecified: Secondary | ICD-10-CM

## 2013-02-08 DIAGNOSIS — I10 Essential (primary) hypertension: Secondary | ICD-10-CM

## 2013-02-08 NOTE — Assessment & Plan Note (Signed)
Blood pressure is controlled. No change in therapy. The patient's daughter will give her extra carvedilol if her pressures running higher.

## 2013-02-08 NOTE — Patient Instructions (Signed)
Your physician wants you to follow-up in: 12 months with Dr Kai Levins will receive a reminder letter in the mail two months in advance. If you don't receive a letter, please call our office to schedule the follow-up appointment.

## 2013-02-08 NOTE — Assessment & Plan Note (Signed)
This occurred in the past. LV function normalized. No further workup.

## 2013-02-08 NOTE — Assessment & Plan Note (Signed)
I continue to try to be reassuring. The patient's pain does not appear to be cardiac in origin. No further workup.

## 2013-02-08 NOTE — Progress Notes (Signed)
HPI  Patient is seen today post hospitalization. She was admitted with some chest discomfort. She was seen by our team in consultation. CT scan with stone and ruled out pulmonary embolus. Her troponins were normal. She was felt that her pain was noncardiac. Since discharge she has seen her primary physician and she is felt to be stable. In the past she had an episode of chest pain. Her coronary arteries were normal. She had some left ventricular dysfunction and it was felt that she had a probable Takotsubo event. Over time her left ventricular function normalized.  Allergies  Allergen Reactions  . Atorvastatin Other (See Comments)    Raises liver enzymes (takes pravastatin at home)  . Influenza Vaccines   . Phenytoin Other (See Comments)    Blood clots  . Promethazine Hcl Other (See Comments)    Severe sedation  . Sulfacetamide Sodium     REACTION: sulfa  . Sulfamethoxazole     REACTION: unspecified    Current Outpatient Prescriptions  Medication Sig Dispense Refill  . B-D SYRINGE LUER-LOK 3CC 3 ML MISC       . BD DISP NEEDLES 25G X 5/8" MISC       . clopidogrel (PLAVIX) 75 MG tablet Take 75 mg by mouth daily with breakfast.      . conjugated estrogens (PREMARIN) vaginal cream Place 1 Applicatorful vaginally daily as needed (vaginal dryness).      . cyanocobalamin (,VITAMIN B-12,) 1000 MCG/ML injection Inject 1 mL (1,000 mcg total) into the muscle every 30 (thirty) days.  3 mL  3  . cyclobenzaprine (FLEXERIL) 10 MG tablet Take 1 tablet (10 mg total) by mouth 3 (three) times daily.  60 tablet  3  . Docusate Sodium (DSS) 100 MG CAPS Take 100 mg by mouth 2 (two) times daily.  30 each  0  . isosorbide mononitrate (IMDUR) 30 MG 24 hr tablet Take 0.5 tablets (15 mg total) by mouth daily.  90 tablet  5  . methimazole (TAPAZOLE) 5 MG tablet Take 5 mg by mouth See admin instructions. Take 5mg  only on Mon., Wed., and Fri.      . nitroGLYCERIN (NITROSTAT) 0.4 MG SL tablet Place 1 tablet (0.4  mg total) under the tongue every 5 (five) minutes as needed. For chest pain.  15 tablet  0  . pravastatin (PRAVACHOL) 20 MG tablet Take 20 mg by mouth daily. Every evening      . traMADol (ULTRAM) 50 MG tablet Take 1 tablet (50 mg total) by mouth every 6 (six) hours as needed. For pain.  90 tablet  0  . triamcinolone ointment (KENALOG) 0.1 % Apply 1 application topically 2 (two) times daily. For dry skin.  30 g  6   No current facility-administered medications for this visit.    History   Social History  . Marital Status: Widowed    Spouse Name: N/A    Number of Children: 4  . Years of Education: N/A   Occupational History  .     Social History Main Topics  . Smoking status: Never Smoker   . Smokeless tobacco: Never Used  . Alcohol Use: No  . Drug Use: No  . Sexual Activity: No   Other Topics Concern  . Not on file   Social History Narrative   Widowed. Lives in [place], [place] with daughter.     Family History  Problem Relation Age of Onset  . Heart attack Brother 68    Past  Medical History  Diagnosis Date  . ANEMIA, PERNICIOUS 09/10/2006  . CORONARY ARTERY DISEASE 11/07/2008    a. NSTEMI 2001 & STEMI 2008 managed medically. b. Takotsubo 06/2006. c. Cath 2009 -> med rx.  . CVA WITH LEFT HEMIPARESIS 07/22/2007    May, 2009, neurology decided aspirin and Plavix at that time.  . DERMATITIS 03/02/2007  . HYPERTENSION 07/15/2006  . MENINGIOMA 08/27/2006    Occipital meningioma, surgery, [facility], 2008 /  MRI, [place], June, 2011, no change in lesion, history believe the patient had a gamma knife treatment of a right occipital atypical meningioma  . OSTEOARTHRITIS 07/15/2006  . PULMONARY NODULE 01/14/2007    [facility], December, 2011  . Takotsubo syndrome 04/22/2008    MI, May, 2008, question takotsubo event  . THYROID NODULE 01/14/2007  . IBS (irritable bowel syndrome)   . DJD (degenerative joint disease)     L shoulder impingement  . Ejection fraction     EF 60%, echo, July,  2010, (  EF improved after tach a suitable event 2008)  . Subarachnoid hemorrhage following injury 2007    Subarachnoid hemorrhage secondary to a fall December, 2007  . Bradycardia     August, 2013  . Family history of anesthesia complication     daughter has a hard time waking up  . H/O hiatal hernia   . Abnormal CT of the chest     Stable patchy/nodular and ground-glass opacities in the right upper lobe, While grossly unchanged, low grade adenocarcinoma remains possible.  . Stroke     Past Surgical History  Procedure Laterality Date  . Cholecystectomy    . Abdominal hysterectomy    . Hip surgery    . Brain tumor excision      Meningioma; treated at [facility] in 2008  . Shoulder surgery    . Joint replacement      rt THR    Patient Active Problem List   Diagnosis Date Noted  . Unstable angina pectoris 01/30/2013  . Shortness of breath 01/30/2013  . Chest pain at rest 04/29/2012  . GERD (gastroesophageal reflux disease) 11/19/2011  . Dysphagia 11/18/2011  . Dyspnea 11/17/2011  . Palpitations 11/17/2011  . Encounter for long-term (current) use of other medications 10/29/2011  . Hyperthyroidism 10/14/2011  . Vertigo 10/14/2011  . Lightheadedness 10/14/2011  . UTI (lower urinary tract infection) 10/14/2011  . Bradycardia   . Ejection fraction   . Subarachnoid hemorrhage following injury   . Hyperlipidemia 03/05/2011  . Takotsubo syndrome 04/22/2008  . CVA WITH LEFT HEMIPARESIS 07/22/2007  . DERMATITIS 03/02/2007  . THYROID NODULE 01/14/2007  . PULMONARY NODULE 01/14/2007  . ANEMIA, PERNICIOUS 09/10/2006  . MENINGIOMA 08/27/2006  . HYPERTENSION 07/15/2006  . OSTEOARTHRITIS 07/15/2006    ROS   Patient denies fever, chills, headache, sweats, rash, change in vision, change in hearing, chest pain, cough, nausea vomiting, urinary symptoms. She is fatigued. She complains of some easy bruisability.  PHYSICAL EXAM  Patient is oriented to person time and place. Affect is  normal. She's here with her daughter. She is thin and frail. She has kyphosis of the thoracic spine. There is no jugulovenous distention. Lungs are clear. Respiratory effort is nonlabored. Cardiac exam her vitals S1-S2. There no clicks or significant murmurs. Abdomen is soft. There is no peripheral edema.  Filed Vitals:   02/08/13 1617  BP: 112/60  Pulse: 84  Height: 5\' 3"  (1.6 m)  Weight: 118 lb (53.524 kg)     ASSESSMENT & PLAN

## 2013-02-09 ENCOUNTER — Telehealth: Payer: Self-pay | Admitting: Internal Medicine

## 2013-02-09 MED ORDER — CYCLOBENZAPRINE HCL 10 MG PO TABS: 10.0000 mg | ORAL_TABLET | Freq: Three times a day (TID) | ORAL | Status: DC | PRN

## 2013-02-09 NOTE — Telephone Encounter (Signed)
[facility] has drug clarification/therapy question on cyclobenzaprine (FLEXERIL) 10 MG tablet pls call and note ref number

## 2013-02-09 NOTE — Telephone Encounter (Signed)
Spoke to pt's daughter [name] asked her how often pt is taking Flexeril. [name] said she is taking it 3 times a day as needed. Told her okay will send new Rx for 90 tablets and refills. [name] verbalized understanding. New Rx sent to [facility].

## 2013-03-02 DIAGNOSIS — I69959 Hemiplegia and hemiparesis following unspecified cerebrovascular disease affecting unspecified side: Secondary | ICD-10-CM

## 2013-03-02 DIAGNOSIS — E785 Hyperlipidemia, unspecified: Secondary | ICD-10-CM

## 2013-03-02 DIAGNOSIS — I10 Essential (primary) hypertension: Secondary | ICD-10-CM

## 2013-03-02 DIAGNOSIS — I2510 Atherosclerotic heart disease of native coronary artery without angina pectoris: Secondary | ICD-10-CM

## 2013-03-02 DIAGNOSIS — M1990 Unspecified osteoarthritis, unspecified site: Secondary | ICD-10-CM

## 2013-03-02 DIAGNOSIS — R0789 Other chest pain: Secondary | ICD-10-CM

## 2013-03-11 ENCOUNTER — Ambulatory Visit (INDEPENDENT_AMBULATORY_CARE_PROVIDER_SITE_OTHER): Payer: Medicare Other | Admitting: Endocrinology

## 2013-03-11 ENCOUNTER — Encounter: Payer: Self-pay | Admitting: Endocrinology

## 2013-03-11 VITALS — BP 116/70 | HR 80 | Temp 97.9°F | Ht 63.0 in | Wt 119.0 lb

## 2013-03-11 DIAGNOSIS — E0590 Thyrotoxicosis, unspecified without thyrotoxic crisis or storm: Secondary | ICD-10-CM

## 2013-03-11 LAB — TSH: TSH: 0.56 u[IU]/mL (ref 0.35–5.50)

## 2013-03-11 NOTE — Patient Instructions (Addendum)
A thyroid blood test is requested for you today.  We'll contact you with results.  if ever you have fever while taking methimazole, stop it and call us, because of the risk of a rare side-effect Please come back for a follow-up appointment in 3 months.    

## 2013-03-11 NOTE — Progress Notes (Signed)
Subjective:    Patient ID: [name], female    DOB: April 24, 1922, 80 y.o.   MRN: [id number]  HPI Pt was noted in the hospital in [place], 2013, to have hyperthyroidism.  She was started on tapazole.  Since on the tapazole, she feels no different.   Pt's dtr says pt takes tapazole only 5 mg qd (1/2 of 10 mg).  She was also noted to have a thyroid nodule, but w/u was felt not to be worthwhile, due to her advanced age and health problems.  Tremor persists. Past Medical History  Diagnosis Date  . ANEMIA, PERNICIOUS 09/10/2006  . CORONARY ARTERY DISEASE 11/07/2008    a. NSTEMI 2001 & STEMI 2008 managed medically. b. Takotsubo 06/2006. c. Cath 2009 -> med rx.  . CVA WITH LEFT HEMIPARESIS 07/22/2007    May, 2009, neurology decided aspirin and Plavix at that time.  . DERMATITIS 03/02/2007  . HYPERTENSION 07/15/2006  . MENINGIOMA 08/27/2006    Occipital meningioma, surgery, [facility], 2008 /  MRI, [place], June, 2011, no change in lesion, history believe the patient had a gamma knife treatment of a right occipital atypical meningioma  . OSTEOARTHRITIS 07/15/2006  . PULMONARY NODULE 01/14/2007    [place], December, 2011  . Takotsubo syndrome 04/22/2008    MI, May, 2008, question takotsubo event  . THYROID NODULE 01/14/2007  . IBS (irritable bowel syndrome)   . DJD (degenerative joint disease)     L shoulder impingement  . Ejection fraction     EF 60%, echo, July, 2010, (  EF improved after tach a suitable event 2008)  . Subarachnoid hemorrhage following injury 2007    Subarachnoid hemorrhage secondary to a fall December, 2007  . Bradycardia     August, 2013  . Family history of anesthesia complication     daughter has a hard time waking up  . H/O hiatal hernia   . Abnormal CT of the chest     Stable patchy/nodular and ground-glass opacities in the right upper lobe, While grossly unchanged, low grade adenocarcinoma remains possible.  . Stroke     Past Surgical History  Procedure Laterality Date    . Cholecystectomy    . Abdominal hysterectomy    . Hip surgery    . Brain tumor excision      Meningioma; [facility] at [facility] in 2008  . Shoulder surgery    . Joint replacement      rt THR    History   Social History  . Marital Status: Widowed    Spouse Name: N/A    Number of Children: 4  . Years of Education: N/A   Occupational History  .     Social History Main Topics  . Smoking status: Never Smoker   . Smokeless tobacco: Never Used  . Alcohol Use: No  . Drug Use: No  . Sexual Activity: No   Other Topics Concern  . Not on file   Social History Narrative   Widowed. Lives in [place], [place] with daughter.     Current Outpatient Prescriptions on File Prior to Visit  Medication Sig Dispense Refill  . B-D SYRINGE LUER-LOK 3CC 3 ML MISC       . BD DISP NEEDLES 25G X 5/8" MISC       . clopidogrel (PLAVIX) 75 MG tablet Take 75 mg by mouth daily with breakfast.      . conjugated estrogens (PREMARIN) vaginal cream Place 1 Applicatorful vaginally daily as needed (vaginal dryness).      Marland Kitchen  cyanocobalamin (,VITAMIN B-12,) 1000 MCG/ML injection Inject 1 mL (1,000 mcg total) into the muscle every 30 (thirty) days.  3 mL  3  . cyclobenzaprine (FLEXERIL) 10 MG tablet Take 1 tablet (10 mg total) by mouth 3 (three) times daily as needed for muscle spasms.  90 tablet  2  . Docusate Sodium (DSS) 100 MG CAPS Take 100 mg by mouth 2 (two) times daily.  30 each  0  . isosorbide mononitrate (IMDUR) 30 MG 24 hr tablet Take 0.5 tablets (15 mg total) by mouth daily.  90 tablet  5  . methimazole (TAPAZOLE) 5 MG tablet Take 5mg  only on Mon., Wed., and Fri.      . nitroGLYCERIN (NITROSTAT) 0.4 MG SL tablet Place 1 tablet (0.4 mg total) under the tongue every 5 (five) minutes as needed. For chest pain.  15 tablet  0  . pravastatin (PRAVACHOL) 20 MG tablet Take 20 mg by mouth daily. Every evening      . traMADol (ULTRAM) 50 MG tablet Take 1 tablet (50 mg total) by mouth every 6 (six) hours as needed.  For pain.  90 tablet  0  . triamcinolone ointment (KENALOG) 0.1 % Apply 1 application topically 2 (two) times daily. For dry skin.  30 g  6   No current facility-administered medications on file prior to visit.    Allergies  Allergen Reactions  . Atorvastatin Other (See Comments)    Raises liver enzymes (takes pravastatin at home)  . Influenza Vaccines   . Phenytoin Other (See Comments)    Blood clots  . Promethazine Hcl Other (See Comments)    Severe sedation  . Sulfacetamide Sodium     REACTION: sulfa  . Sulfamethoxazole     REACTION: unspecified    Family History  Problem Relation Age of Onset  . Heart attack Brother 84    BP 116/70  Pulse 80  Temp(Src) 97.9 F (36.6 C) (Oral)  Ht 5\' 3"  (1.6 m)  Wt 119 lb (53.978 kg)  BMI 21.09 kg/m2  SpO2 97%  Review of Systems Denies fever.    Objective:   Physical Exam Vital signs: see vs page Gen: elderly, frail, no distress NECK: There is no palpable thyroid enlargement.  No thyroid nodule is palpable.  No palpable lymphadenopathy at the anterior neck.  Lab Results  Component Value Date   TSH 0.56 03/11/2013      Assessment & Plan:  Hyperthyroidism, well-controlled.

## 2013-05-04 ENCOUNTER — Encounter: Payer: Self-pay | Admitting: Internal Medicine

## 2013-05-04 ENCOUNTER — Ambulatory Visit (INDEPENDENT_AMBULATORY_CARE_PROVIDER_SITE_OTHER): Payer: Medicare Other | Admitting: Internal Medicine

## 2013-05-04 VITALS — BP 120/80 | HR 78 | Temp 97.7°F | Wt 120.0 lb

## 2013-05-04 DIAGNOSIS — I69959 Hemiplegia and hemiparesis following unspecified cerebrovascular disease affecting unspecified side: Secondary | ICD-10-CM

## 2013-05-04 DIAGNOSIS — E0590 Thyrotoxicosis, unspecified without thyrotoxic crisis or storm: Secondary | ICD-10-CM

## 2013-05-04 DIAGNOSIS — I10 Essential (primary) hypertension: Secondary | ICD-10-CM

## 2013-05-04 DIAGNOSIS — M1990 Unspecified osteoarthritis, unspecified site: Secondary | ICD-10-CM

## 2013-05-04 DIAGNOSIS — R002 Palpitations: Secondary | ICD-10-CM

## 2013-05-04 MED ORDER — NITROGLYCERIN 0.4 MG SL SUBL: 0.4000 mg | SUBLINGUAL_TABLET | SUBLINGUAL | Status: DC | PRN

## 2013-05-04 MED ORDER — TRAMADOL HCL 50 MG PO TABS: 50.0000 mg | ORAL_TABLET | Freq: Four times a day (QID) | ORAL | Status: DC | PRN

## 2013-05-04 MED ORDER — CLOPIDOGREL BISULFATE 75 MG PO TABS: 75.0000 mg | ORAL_TABLET | Freq: Every day | ORAL | Status: DC

## 2013-05-04 MED ORDER — METHIMAZOLE 5 MG PO TABS: ORAL_TABLET | ORAL | Status: DC

## 2013-05-04 MED ORDER — LORAZEPAM 0.5 MG PO TABS: 0.5000 mg | ORAL_TABLET | Freq: Once | ORAL | Status: DC

## 2013-05-04 MED ORDER — PRAVASTATIN SODIUM 20 MG PO TABS: 20.0000 mg | ORAL_TABLET | Freq: Every day | ORAL | Status: DC

## 2013-05-04 MED ORDER — ISOSORBIDE MONONITRATE ER 30 MG PO TB24: 15.0000 mg | ORAL_TABLET | Freq: Every day | ORAL | Status: DC

## 2013-05-04 NOTE — Patient Instructions (Signed)
Return in 6 months for followup    It is important that you exercise regularly, at least 20 minutes 3 to 4 times per week.  If you develop chest pain or shortness of breath seek  medical attention. 

## 2013-05-04 NOTE — Progress Notes (Signed)
Subjective:    Patient ID: [name], female    DOB: 1922-07-17, 80 y.o.   MRN: [id number]  HPI   an 80 year old patient who is seen today for followup.  She has a history of coronary artery and cerebrovascular disease.  She has treated hypertension.  She is followed by endocrinology due to hyperthyroidism and remains on antiviral medications.  She is on pravastatin.  Her cardiac status has been stable and she denies any focal neurological symptoms. Complaints include general fatigue.  She also complains of left lateral chest wall pain.  She has had a recent cardiology followup   Wt Readings from Last 3 Encounters:  05/04/13 120 lb (54.432 kg)  03/11/13 119 lb (53.978 kg)  02/08/13 118 lb (53.524 kg)    Past Medical History  Diagnosis Date  . ANEMIA, PERNICIOUS 09/10/2006  . CORONARY ARTERY DISEASE 11/07/2008    a. NSTEMI 2001 & STEMI 2008 managed medically. b. Takotsubo 06/2006. c. Cath 2009 -> med rx.  . CVA WITH LEFT HEMIPARESIS 07/22/2007    May, 2009, neurology decided aspirin and Plavix at that time.  . DERMATITIS 03/02/2007  . HYPERTENSION 07/15/2006  . MENINGIOMA 08/27/2006    Occipital meningioma, surgery, [facility], 2008 /  MRI, [place], June, 2011, no change in lesion, history believe the patient had a gamma knife treatment of a right occipital atypical meningioma  . OSTEOARTHRITIS 07/15/2006  . PULMONARY NODULE 01/14/2007    [facility], December, 2011  . Takotsubo syndrome 04/22/2008    MI, May, 2008, question takotsubo event  . THYROID NODULE 01/14/2007  . IBS (irritable bowel syndrome)   . DJD (degenerative joint disease)     L shoulder impingement  . Ejection fraction     EF 60%, echo, July, 2010, (  EF improved after tach a suitable event 2008)  . Subarachnoid hemorrhage following injury 2007    Subarachnoid hemorrhage secondary to a fall December, 2007  . Bradycardia     August, 2013  . Family history of anesthesia complication     daughter has a hard time waking up    . H/O hiatal hernia   . Abnormal CT of the chest     Stable patchy/nodular and ground-glass opacities in the right upper lobe, While grossly unchanged, low grade adenocarcinoma remains possible.  . Stroke     History   Social History  . Marital Status: Widowed    Spouse Name: N/A    Number of Children: 4  . Years of Education: N/A   Occupational History  .     Social History Main Topics  . Smoking status: Never Smoker   . Smokeless tobacco: Never Used  . Alcohol Use: No  . Drug Use: No  . Sexual Activity: No   Other Topics Concern  . Not on file   Social History Narrative   Widowed. Lives in [place], [place] with daughter.     Past Surgical History  Procedure Laterality Date  . Cholecystectomy    . Abdominal hysterectomy    . Hip surgery    . Brain tumor excision      Meningioma; [facility] at [facility] in 2008  . Shoulder surgery    . Joint replacement      rt THR    Family History  Problem Relation Age of Onset  . Heart attack Brother 84    Allergies  Allergen Reactions  . Atorvastatin Other (See Comments)    Raises liver enzymes (takes pravastatin at home)  .  Influenza Vaccines   . Phenytoin Other (See Comments)    Blood clots  . Promethazine Hcl Other (See Comments)    Severe sedation  . Sulfacetamide Sodium     REACTION: sulfa  . Sulfamethoxazole     REACTION: unspecified    Current Outpatient Prescriptions on File Prior to Visit  Medication Sig Dispense Refill  . B-D SYRINGE LUER-LOK 3CC 3 ML MISC       . BD DISP NEEDLES 25G X 5/8" MISC       . clopidogrel (PLAVIX) 75 MG tablet Take 75 mg by mouth daily with breakfast.      . conjugated estrogens (PREMARIN) vaginal cream Place 1 Applicatorful vaginally daily as needed (vaginal dryness).      . cyanocobalamin (,VITAMIN B-12,) 1000 MCG/ML injection Inject 1 mL (1,000 mcg total) into the muscle every 30 (thirty) days.  3 mL  3  . cyclobenzaprine (FLEXERIL) 10 MG tablet Take 1 tablet (10 mg total) by  mouth 3 (three) times daily as needed for muscle spasms.  90 tablet  2  . Docusate Sodium (DSS) 100 MG CAPS Take 100 mg by mouth 2 (two) times daily.  30 each  0  . isosorbide mononitrate (IMDUR) 30 MG 24 hr tablet Take 0.5 tablets (15 mg total) by mouth daily.  90 tablet  5  . methimazole (TAPAZOLE) 5 MG tablet Take 5mg  only on Mon., Wed., and Fri.      . nitroGLYCERIN (NITROSTAT) 0.4 MG SL tablet Place 1 tablet (0.4 mg total) under the tongue every 5 (five) minutes as needed. For chest pain.  15 tablet  0  . pravastatin (PRAVACHOL) 20 MG tablet Take 20 mg by mouth daily. Every evening      . traMADol (ULTRAM) 50 MG tablet Take 1 tablet (50 mg total) by mouth every 6 (six) hours as needed. For pain.  90 tablet  0  . triamcinolone ointment (KENALOG) 0.1 % Apply 1 application topically 2 (two) times daily. For dry skin.  30 g  6   No current facility-administered medications on file prior to visit.    BP 120/80  Pulse 78  Temp(Src) 97.7 F (36.5 C) (Oral)  Wt 120 lb (54.432 kg)  SpO2 97%     Review of Systems  Constitutional: Positive for fatigue.  HENT: Negative for congestion, dental problem, hearing loss, rhinorrhea, sinus pressure, sore throat and tinnitus.   Eyes: Negative for pain, discharge and visual disturbance.  Respiratory: Negative for cough and shortness of breath.   Cardiovascular: Positive for chest pain. Negative for palpitations and leg swelling.  Gastrointestinal: Negative for nausea, vomiting, abdominal pain, diarrhea, constipation, blood in stool and abdominal distention.  Genitourinary: Negative for dysuria, urgency, frequency, hematuria, flank pain, vaginal bleeding, vaginal discharge, difficulty urinating, vaginal pain and pelvic pain.  Musculoskeletal: Negative for arthralgias, gait problem and joint swelling.  Skin: Negative for rash.  Neurological: Positive for weakness. Negative for dizziness, syncope, speech difficulty, numbness and headaches.    Hematological: Negative for adenopathy.  Psychiatric/Behavioral: Negative for behavioral problems, dysphoric mood and agitation. The patient is not nervous/anxious.        Objective:   Physical Exam  Constitutional: She is oriented to person, place, and time. She appears well-developed and well-nourished.  HENT:  Head: Normocephalic.  Right Ear: External ear normal.  Left Ear: External ear normal.  Mouth/Throat: Oropharynx is clear and moist.  Eyes: Conjunctivae and EOM are normal. Pupils are equal, round, and reactive to light.  Neck: Normal range of motion. Neck supple. No thyromegaly present.  Cardiovascular: Normal rate, regular rhythm, normal heart sounds and intact distal pulses.   Pulmonary/Chest: Effort normal and breath sounds normal.  Abdominal: Soft. Bowel sounds are normal. She exhibits no mass. There is no tenderness.  Musculoskeletal: Normal range of motion.  Lymphadenopathy:    She has no cervical adenopathy.  Neurological: She is alert and oriented to person, place, and time.  Skin: Skin is warm and dry. No rash noted.  Psychiatric: She has a normal mood and affect. Her behavior is normal.          Assessment & Plan:   Coronary artery disease, stable Hypertension well controlled Hyperthyroidism.  Continue anti-thyroid medication, dyslipidemia Cerebrovascular disease, stable History of meningioma  No change in medicines.  All medications refilled Followup consultants recheck her 6 month

## 2013-05-04 NOTE — Progress Notes (Signed)
Pre visit review using our clinic review tool, if applicable. No additional management support is needed unless otherwise documented below in the visit note. 

## 2013-05-05 ENCOUNTER — Telehealth: Payer: Self-pay | Admitting: Internal Medicine

## 2013-05-05 NOTE — Telephone Encounter (Signed)
Relevant patient education assigned to patient using Emmi. ° °

## 2013-05-24 ENCOUNTER — Ambulatory Visit: Payer: Medicare Other | Admitting: Internal Medicine

## 2013-06-08 ENCOUNTER — Ambulatory Visit (INDEPENDENT_AMBULATORY_CARE_PROVIDER_SITE_OTHER): Payer: Medicare Other | Admitting: Endocrinology

## 2013-06-08 ENCOUNTER — Encounter: Payer: Self-pay | Admitting: Endocrinology

## 2013-06-08 VITALS — BP 118/84 | HR 73 | Temp 97.9°F | Ht 63.0 in | Wt 120.0 lb

## 2013-06-08 DIAGNOSIS — E0590 Thyrotoxicosis, unspecified without thyrotoxic crisis or storm: Secondary | ICD-10-CM

## 2013-06-08 LAB — T4, FREE: Free T4: 0.98 ng/dL (ref 0.60–1.60)

## 2013-06-08 LAB — TSH: TSH: 1.4 u[IU]/mL (ref 0.35–4.50)

## 2013-06-08 NOTE — Patient Instructions (Addendum)
A thyroid blood test is requested for you today.  We'll contact you with results.   if ever you have fever while taking methimazole, stop it and call us, because of the risk of a rare side-effect.   Please come back for a follow-up appointment in 4 months.   

## 2013-06-08 NOTE — Progress Notes (Signed)
Subjective:    Patient ID: [name], female    DOB: 11/15/1922, 80 y.o.   MRN: [id number]  HPI Pt was noted in the hospital in [place], 2013, to have hyperthyroidism.  She was started on tapazole.  Since on the tapazole, she feels no different).  She was also noted to have a thyroid nodule, but w/u was felt not to be worthwhile, due to her advanced age and health problems.  Tremor persists. Past Medical History  Diagnosis Date  . ANEMIA, PERNICIOUS 09/10/2006  . CORONARY ARTERY DISEASE 11/07/2008    a. NSTEMI 2001 & STEMI 2008 managed medically. b. Takotsubo 06/2006. c. Cath 2009 -> med rx.  . CVA WITH LEFT HEMIPARESIS 07/22/2007    May, 2009, neurology decided aspirin and Plavix at that time.  . DERMATITIS 03/02/2007  . HYPERTENSION 07/15/2006  . MENINGIOMA 08/27/2006    Occipital meningioma, surgery, [facility], 2008 /  MRI, [place], June, 2011, no change in lesion, history believe the patient had a gamma knife treatment of a right occipital atypical meningioma  . OSTEOARTHRITIS 07/15/2006  . PULMONARY NODULE 01/14/2007    [place], December, 2011  . Takotsubo syndrome 04/22/2008    MI, May, 2008, question takotsubo event  . THYROID NODULE 01/14/2007  . IBS (irritable bowel syndrome)   . DJD (degenerative joint disease)     L shoulder impingement  . Ejection fraction     EF 60%, echo, July, 2010, (  EF improved after tach a suitable event 2008)  . Subarachnoid hemorrhage following injury 2007    Subarachnoid hemorrhage secondary to a fall December, 2007  . Bradycardia     August, 2013  . Family history of anesthesia complication     daughter has a hard time waking up  . H/O hiatal hernia   . Abnormal CT of the chest     Stable patchy/nodular and ground-glass opacities in the right upper lobe, While grossly unchanged, low grade adenocarcinoma remains possible.  . Stroke     Past Surgical History  Procedure Laterality Date  . Cholecystectomy    . Abdominal hysterectomy    . Hip  surgery    . Brain tumor excision      Meningioma; [facility] at [facility] in [facility]  . Shoulder surgery    . Joint replacement      rt THR    History   Social History  . Marital Status: Widowed    Spouse Name: N/A    Number of Children: 4  . Years of Education: N/A   Occupational History  .     Social History Main Topics  . Smoking status: Never Smoker   . Smokeless tobacco: Never Used  . Alcohol Use: No  . Drug Use: No  . Sexual Activity: No   Other Topics Concern  . Not on file   Social History Narrative   Widowed. Lives in [place], [place] with daughter.     Current Outpatient Prescriptions on File Prior to Visit  Medication Sig Dispense Refill  . B-D SYRINGE LUER-LOK 3CC 3 ML MISC       . BD DISP NEEDLES 25G X 5/8" MISC       . clopidogrel (PLAVIX) 75 MG tablet Take 1 tablet (75 mg total) by mouth daily with breakfast.  90 tablet  5  . conjugated estrogens (PREMARIN) vaginal cream Place 1 Applicatorful vaginally daily as needed (vaginal dryness).      . cyanocobalamin (,VITAMIN B-12,) 1000 MCG/ML injection Inject 1 mL (  1,000 mcg total) into the muscle every 30 (thirty) days.  3 mL  3  . cyclobenzaprine (FLEXERIL) 10 MG tablet Take 1 tablet (10 mg total) by mouth 3 (three) times daily as needed for muscle spasms.  90 tablet  2  . isosorbide mononitrate (IMDUR) 30 MG 24 hr tablet Take 0.5 tablets (15 mg total) by mouth daily.  90 tablet  3  . methimazole (TAPAZOLE) 5 MG tablet Take 5mg  only on Mon., Wed., and Fri.  90 tablet  0  . nitroGLYCERIN (NITROSTAT) 0.4 MG SL tablet Place 1 tablet (0.4 mg total) under the tongue every 5 (five) minutes as needed. For chest pain.  15 tablet  3  . pravastatin (PRAVACHOL) 20 MG tablet Take 1 tablet (20 mg total) by mouth daily. Every evening  90 tablet  5  . traMADol (ULTRAM) 50 MG tablet Take 1 tablet (50 mg total) by mouth every 6 (six) hours as needed. For pain.  90 tablet  3  . triamcinolone ointment (KENALOG) 0.1 % Apply 1 application  topically 2 (two) times daily. For dry skin.  30 g  6  . Docusate Sodium (DSS) 100 MG CAPS Take 100 mg by mouth 2 (two) times daily.  30 each  0   No current facility-administered medications on file prior to visit.    Allergies  Allergen Reactions  . Atorvastatin Other (See Comments)    Raises liver enzymes (takes pravastatin at home)  . Influenza Vaccines   . Phenytoin Other (See Comments)    Blood clots  . Promethazine Hcl Other (See Comments)    Severe sedation  . Sulfacetamide Sodium     REACTION: sulfa  . Sulfamethoxazole     REACTION: unspecified    Family History  Problem Relation Age of Onset  . Heart attack Brother 84    BP 118/84  Pulse 73  Temp(Src) 97.9 F (36.6 C) (Oral)  Ht 5\' 3"  (1.6 m)  Wt 120 lb (54.432 kg)  BMI 21.26 kg/m2  SpO2 98%   Review of Systems Denies fever    Objective:   Physical Exam VITAL SIGNS:  See vs page GENERAL: no distress Skin: not diaphoretic Neuro: no tremor.      Assessment & Plan:  Hyperthyroidism, on rx

## 2013-08-13 ENCOUNTER — Telehealth: Payer: Self-pay | Admitting: Internal Medicine

## 2013-08-13 ENCOUNTER — Observation Stay (HOSPITAL_COMMUNITY)
Admission: EM | Admit: 2013-08-13 | Discharge: 2013-08-15 | Disposition: A | Discharge status: Home / Self Care | Payer: Medicare Other | Attending: Family Medicine | Admitting: Family Medicine

## 2013-08-13 ENCOUNTER — Emergency Department (HOSPITAL_COMMUNITY): Payer: Medicare Other

## 2013-08-13 ENCOUNTER — Encounter (HOSPITAL_COMMUNITY): Payer: Self-pay | Admitting: Emergency Medicine

## 2013-08-13 DIAGNOSIS — I2510 Atherosclerotic heart disease of native coronary artery without angina pectoris: Secondary | ICD-10-CM | POA: Diagnosis not present

## 2013-08-13 DIAGNOSIS — Z7902 Long term (current) use of antithrombotics/antiplatelets: Secondary | ICD-10-CM | POA: Diagnosis not present

## 2013-08-13 DIAGNOSIS — M1990 Unspecified osteoarthritis, unspecified site: Secondary | ICD-10-CM | POA: Diagnosis not present

## 2013-08-13 DIAGNOSIS — Z872 Personal history of diseases of the skin and subcutaneous tissue: Secondary | ICD-10-CM | POA: Insufficient documentation

## 2013-08-13 DIAGNOSIS — K449 Diaphragmatic hernia without obstruction or gangrene: Secondary | ICD-10-CM | POA: Insufficient documentation

## 2013-08-13 DIAGNOSIS — D510 Vitamin B12 deficiency anemia due to intrinsic factor deficiency: Secondary | ICD-10-CM | POA: Diagnosis not present

## 2013-08-13 DIAGNOSIS — E0590 Thyrotoxicosis, unspecified without thyrotoxic crisis or storm: Secondary | ICD-10-CM | POA: Diagnosis present

## 2013-08-13 DIAGNOSIS — I498 Other specified cardiac arrhythmias: Secondary | ICD-10-CM | POA: Diagnosis not present

## 2013-08-13 DIAGNOSIS — I5181 Takotsubo syndrome: Secondary | ICD-10-CM | POA: Insufficient documentation

## 2013-08-13 DIAGNOSIS — I69959 Hemiplegia and hemiparesis following unspecified cerebrovascular disease affecting unspecified side: Secondary | ICD-10-CM

## 2013-08-13 DIAGNOSIS — I10 Essential (primary) hypertension: Secondary | ICD-10-CM | POA: Insufficient documentation

## 2013-08-13 DIAGNOSIS — J984 Other disorders of lung: Secondary | ICD-10-CM | POA: Insufficient documentation

## 2013-08-13 DIAGNOSIS — R0602 Shortness of breath: Secondary | ICD-10-CM | POA: Diagnosis not present

## 2013-08-13 DIAGNOSIS — K219 Gastro-esophageal reflux disease without esophagitis: Secondary | ICD-10-CM | POA: Diagnosis present

## 2013-08-13 DIAGNOSIS — E041 Nontoxic single thyroid nodule: Secondary | ICD-10-CM | POA: Insufficient documentation

## 2013-08-13 DIAGNOSIS — Z8673 Personal history of transient ischemic attack (TIA), and cerebral infarction without residual deficits: Secondary | ICD-10-CM | POA: Insufficient documentation

## 2013-08-13 DIAGNOSIS — R079 Chest pain, unspecified: Principal | ICD-10-CM | POA: Insufficient documentation

## 2013-08-13 DIAGNOSIS — K589 Irritable bowel syndrome without diarrhea: Secondary | ICD-10-CM | POA: Insufficient documentation

## 2013-08-13 LAB — BASIC METABOLIC PANEL
ANION GAP: 12 (ref 5–15)
BUN: 11 mg/dL (ref 6–23)
CHLORIDE: 102 meq/L (ref 96–112)
CO2: 27 mEq/L (ref 19–32)
Calcium: 9.2 mg/dL (ref 8.4–10.5)
Creatinine, Ser: 0.78 mg/dL (ref 0.50–1.10)
GFR, EST AFRICAN AMERICAN: 83 mL/min — AB (ref >90)
GFR, EST NON AFRICAN AMERICAN: 72 mL/min — AB (ref >90)
Glucose, Bld: 115 mg/dL — ABNORMAL HIGH (ref 70–99)
Potassium: 4 mEq/L (ref 3.7–5.3)
SODIUM: 141 meq/L (ref 137–147)

## 2013-08-13 LAB — CBC
HCT: 35.8 % — ABNORMAL LOW (ref 36.0–46.0)
HCT: 35.5 % — ABNORMAL LOW (ref 36.0–46.0)
Hemoglobin: 11.8 g/dL — ABNORMAL LOW (ref 12.0–15.0)
Hemoglobin: 11.7 g/dL — ABNORMAL LOW (ref 12.0–15.0)
MCH: 30.1 pg (ref 26.0–34.0)
MCH: 30.6 pg (ref 26.0–34.0)
MCHC: 33 g/dL (ref 30.0–36.0)
MCHC: 33 g/dL (ref 30.0–36.0)
MCV: 91.3 fL (ref 78.0–100.0)
MCV: 92.9 fL (ref 78.0–100.0)
PLATELETS: 229 10*3/uL (ref 150–400)
Platelets: 196 10*3/uL (ref 150–400)
RBC: 3.92 MIL/uL (ref 3.87–5.11)
RBC: 3.82 MIL/uL — AB (ref 3.87–5.11)
RDW: 14 % (ref 11.5–15.5)
RDW: 13.8 % (ref 11.5–15.5)
WBC: 6.6 10*3/uL (ref 4.0–10.5)
WBC: 5.9 10*3/uL (ref 4.0–10.5)

## 2013-08-13 LAB — CREATININE, SERUM
Creatinine, Ser: 0.8 mg/dL (ref 0.50–1.10)
GFR calc Af Amer: 74 mL/min — ABNORMAL LOW (ref >90)
GFR calc non Af Amer: 63 mL/min — ABNORMAL LOW (ref >90)

## 2013-08-13 LAB — I-STAT TROPONIN, ED: Troponin i, poc: 0.02 ng/mL (ref 0.00–0.08)

## 2013-08-13 LAB — TROPONIN I: Troponin I: <0.3 ng/mL (ref <0.30)

## 2013-08-13 LAB — PRO B NATRIURETIC PEPTIDE: Pro B Natriuretic peptide (BNP): 368.6 pg/mL (ref 0–450)

## 2013-08-13 MED ORDER — REGADENOSON 0.4 MG/5ML IV SOLN
0.4000 mg | Freq: Once | INTRAVENOUS | Status: DC
  Filled 2013-08-13: qty 5

## 2013-08-13 MED ORDER — HYDROMORPHONE HCL PF 1 MG/ML IJ SOLN
1.0000 mg | Freq: Once | INTRAMUSCULAR | Status: AC
  Administered 2013-08-13: 1 mg via INTRAVENOUS
  Filled 2013-08-13: qty 1

## 2013-08-13 MED ORDER — ONDANSETRON HCL 4 MG/2ML IJ SOLN: 4.0000 mg | Freq: Four times a day (QID) | INTRAMUSCULAR | Status: DC | PRN

## 2013-08-13 MED ORDER — TRAMADOL HCL 50 MG PO TABS: 50.0000 mg | ORAL_TABLET | Freq: Four times a day (QID) | ORAL | Status: DC | PRN

## 2013-08-13 MED ORDER — NITROGLYCERIN 0.4 MG SL SUBL: 0.4000 mg | SUBLINGUAL_TABLET | SUBLINGUAL | Status: DC | PRN

## 2013-08-13 MED ORDER — ISOSORBIDE MONONITRATE 15 MG HALF TABLET
15.0000 mg | ORAL_TABLET | Freq: Every day | ORAL | Status: DC
  Administered 2013-08-14 – 2013-08-15 (×2): 15 mg via ORAL
  Filled 2013-08-13 (×2): qty 1

## 2013-08-13 MED ORDER — CYANOCOBALAMIN 1000 MCG/ML IJ SOLN: 1000.0000 ug | INTRAMUSCULAR | Status: DC

## 2013-08-13 MED ORDER — NITROGLYCERIN 0.4 MG SL SUBL
0.4000 mg | SUBLINGUAL_TABLET | SUBLINGUAL | Status: DC | PRN
Start: 2013-08-13 — End: 2013-08-13

## 2013-08-13 MED ORDER — ACETAMINOPHEN 325 MG PO TABS: 650.0000 mg | ORAL_TABLET | ORAL | Status: DC | PRN

## 2013-08-13 MED ORDER — METHIMAZOLE 5 MG PO TABS: 5.0000 mg | ORAL_TABLET | ORAL | Status: DC

## 2013-08-13 MED ORDER — CEFTRIAXONE SODIUM 1 G IJ SOLR
1.0000 g | Freq: Once | INTRAMUSCULAR | Status: DC
  Filled 2013-08-13: qty 10

## 2013-08-13 MED ORDER — CYCLOBENZAPRINE HCL 10 MG PO TABS
10.0000 mg | ORAL_TABLET | Freq: Three times a day (TID) | ORAL | Status: DC | PRN
  Filled 2013-08-13: qty 1

## 2013-08-13 MED ORDER — MORPHINE SULFATE 2 MG/ML IJ SOLN: 2.0000 mg | INTRAMUSCULAR | Status: DC | PRN

## 2013-08-13 MED ORDER — CLOPIDOGREL BISULFATE 75 MG PO TABS
75.0000 mg | ORAL_TABLET | Freq: Every day | ORAL | Status: DC
  Administered 2013-08-14 – 2013-08-15 (×2): 75 mg via ORAL
  Filled 2013-08-13 (×2): qty 1

## 2013-08-13 MED ORDER — ASPIRIN 81 MG PO CHEW
324.0000 mg | CHEWABLE_TABLET | Freq: Once | ORAL | Status: AC
  Administered 2013-08-13: 324 mg via ORAL
  Filled 2013-08-13: qty 4

## 2013-08-13 MED ORDER — PRAVASTATIN SODIUM 10 MG PO TABS
20.0000 mg | ORAL_TABLET | Freq: Every day | ORAL | Status: DC
  Administered 2013-08-14 – 2013-08-15 (×2): 20 mg via ORAL
  Filled 2013-08-13 (×2): qty 2

## 2013-08-13 MED ORDER — HEPARIN SODIUM (PORCINE) 5000 UNIT/ML IJ SOLN
5000.0000 [IU] | Freq: Three times a day (TID) | INTRAMUSCULAR | Status: DC
  Administered 2013-08-13 – 2013-08-14 (×2): 5000 [IU] via SUBCUTANEOUS
  Filled 2013-08-13 (×8): qty 1

## 2013-08-13 NOTE — ED Notes (Signed)
Patient has history of chest pain, previous MI, no stent, 2 strokes previous.

## 2013-08-13 NOTE — H&P (Signed)
[facility] History and Physical  [name] HGD:[id number] DOB: November 16, 1922 DOA: 08/13/2013  Referring physician: Betsey Holiday PCP: Nyoka Cowden, MD   Chief Complaint: Chest discomfort  HPI: [name] is a 80 y.o. female  With pmh listed below presenting with 1 day complaint of chest discomfort reported as "normal pain." Did not radiate to her back.  States that her pain since onset has come on and on. The pain is mostly located in her mid and left chest. The ED physician reported that pain is reproducible on palpation. Given patient's past medical history was discussed with cardiology who recommended medical admission for further evaluation and recommendations.  Plan is for ACS rule out   Review of Systems:  Constitutional:  No weight loss, night sweats, Fevers, chills, fatigue.  HEENT:  No headaches, Difficulty swallowing,Tooth/dental problems,Sore throat,  No sneezing, itching, ear ache, nasal congestion, post nasal drip,  Cardio-vascular:  + chest pain, Orthopnea, PND, swelling in lower extremities, anasarca, dizziness, palpitations  GI:  No heartburn, indigestion, abdominal pain, nausea, vomiting, diarrhea, change in bowel habits, loss of appetite  Resp:  No shortness of breath with exertion or at rest. No excess mucus, no productive cough, No non-productive cough, No coughing up of blood.No change in color of mucus.No wheezing.No chest wall deformity  Skin:  no rash or lesions.  GU:  no dysuria, change in color of urine, no urgency or frequency. No flank pain.  Musculoskeletal:  No joint pain or swelling. No decreased range of motion. No back pain.  Psych:  No change in mood or affect. No depression or anxiety. No memory loss.   Past Medical History  Diagnosis Date  . ANEMIA, PERNICIOUS 09/10/2006  . CORONARY ARTERY DISEASE 11/07/2008    a. NSTEMI 2001 & STEMI 2008 managed medically. b. Takotsubo 06/2006. c. Cath 2009 -> med rx.  . CVA WITH LEFT  HEMIPARESIS 07/22/2007    May, 2009, neurology decided aspirin and Plavix at that time.  . DERMATITIS 03/02/2007  . HYPERTENSION 07/15/2006  . MENINGIOMA 08/27/2006    Occipital meningioma, surgery, [facility], 2008 /  MRI, [place], June, 2011, no change in lesion, history believe the patient had a gamma knife treatment of a right occipital atypical meningioma  . OSTEOARTHRITIS 07/15/2006  . PULMONARY NODULE 01/14/2007    [place], December, 2011  . Takotsubo syndrome 04/22/2008    MI, May, 2008, question takotsubo event  . THYROID NODULE 01/14/2007  . IBS (irritable bowel syndrome)   . DJD (degenerative joint disease)     L shoulder impingement  . Ejection fraction     EF 60%, echo, July, 2010, (  EF improved after tach a suitable event 2008)  . Subarachnoid hemorrhage following injury 2007    Subarachnoid hemorrhage secondary to a fall December, 2007  . Bradycardia     August, 2013  . Family history of anesthesia complication     daughter has a hard time waking up  . H/O hiatal hernia   . Abnormal CT of the chest     Stable patchy/nodular and ground-glass opacities in the right upper lobe, While grossly unchanged, low grade adenocarcinoma remains possible.  . Stroke    Past Surgical History  Procedure Laterality Date  . Cholecystectomy    . Abdominal hysterectomy    . Hip surgery    . Brain tumor excision      Meningioma; treated at [facility] in 2008  . Shoulder surgery    . Joint replacement  rt THR   Social History:  reports that she has never smoked. She has never used smokeless tobacco. She reports that she does not drink alcohol or use illicit drugs.  Allergies  Allergen Reactions  . Atorvastatin Other (See Comments)    Raises liver enzymes (takes pravastatin at home)  . Influenza Vaccines Other (See Comments)    Unknown  . Phenytoin Other (See Comments)    Blood clots  . Promethazine Hcl Other (See Comments)    Severe sedation  . Sulfacetamide Sodium     REACTION:  sulfa  . Sulfamethoxazole     REACTION: unspecified    Family History  Problem Relation Age of Onset  . Heart attack Brother 84     Prior to Admission medications   Medication Sig Start Date End Date Taking? Authorizing Provider  clopidogrel (PLAVIX) 75 MG tablet Take 1 tablet (75 mg total) by mouth daily with breakfast. 05/04/13  Yes Marletta Lor, MD  cyanocobalamin (,VITAMIN B-12,) 1000 MCG/ML injection Inject 1 mL (1,000 mcg total) into the muscle every 30 (thirty) days. 01/05/13  Yes Marletta Lor, MD  cyclobenzaprine (FLEXERIL) 10 MG tablet Take 1 tablet (10 mg total) by mouth 3 (three) times daily as needed for muscle spasms. 02/09/13  Yes Marletta Lor, MD  isosorbide mononitrate (IMDUR) 30 MG 24 hr tablet Take 0.5 tablets (15 mg total) by mouth daily. 05/04/13  Yes Marletta Lor, MD  methimazole (TAPAZOLE) 5 MG tablet Take 5mg  only on Mon., Wed., and Fri. 05/04/13  Yes Marletta Lor, MD  nitroGLYCERIN (NITROSTAT) 0.4 MG SL tablet Place 1 tablet (0.4 mg total) under the tongue every 5 (five) minutes as needed. For chest pain. 05/04/13  Yes Marletta Lor, MD  pravastatin (PRAVACHOL) 20 MG tablet Take 1 tablet (20 mg total) by mouth daily. Every evening 05/04/13  Yes Marletta Lor, MD  traMADol (ULTRAM) 50 MG tablet Take 1 tablet (50 mg total) by mouth every 6 (six) hours as needed. For pain. 05/04/13  Yes Marletta Lor, MD  triamcinolone ointment (KENALOG) 0.1 % Apply 1 application topically 2 (two) times daily. For dry skin. 02/03/13  Yes Marletta Lor, MD  B-D SYRINGE LUER-LOK 3CC 3 ML MISC  01/05/13   Historical Provider, MD  BD DISP NEEDLES 25G X 5/8" [place]  01/05/13   Historical Provider, MD   Physical Exam: Filed Vitals:   08/13/13 1649  BP: 171/75  Pulse: 57  Temp: 98 F (36.7 C)  Resp: 16    BP 171/75  Pulse 57  Temp(Src) 98 F (36.7 C) (Oral)  Resp 16  Ht 5\' 5"  (1.651 m)  Wt 54.749 kg (120 lb 11.2 oz)  BMI 20.09 kg/m2   SpO2 96%  General:  Appears calm and comfortable Eyes: PERRL, normal lids, irises & conjunctiva ENT: grossly normal hearing, lips & tongue Neck: no LAD, masses or thyromegaly Cardiovascular: RRR, no m/r/g. No LE edema. Telemetry: SR, no arrhythmias  Respiratory: CTA bilaterally, no w/r/r. Normal respiratory effort. Abdomen: soft, ntnd Skin: no rash or induration seen on limited exam Musculoskeletal:  chest discomfort with palpation, no cyanosis Psychiatric: grossly normal mood and affect, speech fluent and appropriate Neurologic: Answers questions appropriately, no facial asymmetry           Labs on Admission:  Basic Metabolic Panel:  Recent Labs Lab 08/13/13 1139  NA 141  K 4.0  CL 102  CO2 27  GLUCOSE 115*  BUN 11  CREATININE  0.78  CALCIUM 9.2   Liver Function Tests: No results found for this basename: AST, ALT, ALKPHOS, BILITOT, PROT, ALBUMIN,  in the last 168 hours No results found for this basename: LIPASE, AMYLASE,  in the last 168 hours No results found for this basename: AMMONIA,  in the last 168 hours CBC:  Recent Labs Lab 08/13/13 1139  WBC 6.6  HGB 11.8*  HCT 35.8*  MCV 91.3  PLT 229   Cardiac Enzymes:  Recent Labs Lab 08/13/13 1139  TROPONINI <0.30    BNP (last 3 results)  Recent Labs  01/16/13 1554 01/30/13 1627 08/13/13 1139  PROBNP 373.9 604.2* 368.6   CBG: No results found for this basename: GLUCAP,  in the last 168 hours  Radiological Exams on Admission: Dg Chest Port 1 View  08/13/2013   CLINICAL DATA:  Chest heaviness, weakness, history hypertension, coronary artery disease, stroke  EXAM: PORTABLE CHEST - 1 VIEW  COMPARISON:  Portable exam 1221 hr compared to 01/30/2013  FINDINGS: Upper normal heart size.  Calcified thoracic aorta.  Mediastinal contours and pulmonary vascularity normal.  Lungs appear emphysematous but clear.  No pleural effusion or pneumothorax.  Bones demineralized.  IMPRESSION: COPD changes.  No acute  abnormalities.   Electronically Signed   By: Lavonia Dana M.D.   On: 08/13/2013 12:45    EKG: Independently reviewed. Sinus rhythm with occasional pvc's  Assessment/Plan Chest Pain - Cardiology on board - Plan is for Lexyscan  H/o CVA c left hemiparesis - Plavix  GERD - stable, may have contributed to principle problem  Hyperthyroidism - Methimazole on board  DVT prophylaxis - Heparin SQ  Code Status:  Family Communication:  Disposition Plan:   Time spent: > 35  minutes  Velvet Bathe [facility] Pager [phone]  **Disclaimer: This note may have been dictated with voice recognition software. Similar sounding words can inadvertently be transcribed and this note may contain transcription errors which may not have been corrected upon publication of note.**

## 2013-08-13 NOTE — Telephone Encounter (Signed)
Patient Information:  Caller Name: Noriah  Phone: [phone]  Patient: [name], [name]  Gender: Female  DOB: [birth year]  Age: 80 Years  PCP: Bluford Kaufmann (Family Practice > 63yrs old)  Office Follow Up:  Does the office need to follow up with this patient?: No  Instructions For The Office: N/A  RN Note:  Daughter called and made aware; will be at mother's house in 5 minutes; daughter instructed to call 19; daughter instructed to have mom push life alert button; office called and instructed to send to ED; pt instructed to use life alert; pt refusing and states that she is just going to lay down after much encouragment to call 911 continues to refuse; and hung up the phone; daughter called and made aware and will get her to ED via 911 or provate vehicle  Symptoms  Reason For Call & Symptoms: Pt is calling and states that she had chest pain on 08/12/13 at 6:00pm; took NTG with relief; feels very weak today; hard to get up and walk today;  having some chest pain now but not as bad as yesterday; hx of heart problems  Reviewed Health History In EMR: N/A  Reviewed Medications In EMR: N/A  Reviewed Allergies In EMR: N/A  Reviewed Surgeries / Procedures: N/A  Date of Onset of Symptoms: 08/12/2013  Guideline(s) Used:  Chest Pain  Disposition Per Guideline:   Call EMS 911 Now  Reason For Disposition Reached:   Chest pain lasting longer than 5 minutes and ANY of the following:  Over 85 years old Over 64 years old and at least one cardiac risk factor (i.e., high blood pressure, diabetes, high cholesterol, obesity, smoker or strong family history of heart disease) Pain is crushing, pressure-like, or heavy  Took nitroglycerin and chest pain was not relieved History of heart disease (i.e., angina, heart attack, bypass surgery, angioplasty, CHF)  Advice Given:  N/A  Patient Will Follow Care Advice:  YES

## 2013-08-13 NOTE — Consult Note (Addendum)
CARDIOLOGY CONSULT NOTE       Patient ID: [name] MRN: [id number] DOB/AGE: 03-03-1922 80 y.o.  Admit date: 08/13/2013 Referring Physician:  Betsey Holiday Primary Physician: Nyoka Cowden, MD Primary Cardiologist:  Ron Parker Reason for Consultation:  Chest Pain  Active Problems:   * No active hospital problems. *   HPI:   80 yo seen in ER for vague complaints of not feeling well.  She has chest and neck pain.  Feels weak.  Distant history of MI  In 2001  MI in 2008 but cath with no CAD thought to be Takatsubo DCM  EF 45% Cath in 2009 with no obstructive disease.  She has never had a stent or intervention.  She is a concentration camp survivor from [place]  She still lives independantly with 5 dogs but is increasingly frail.  The daughter Indicates she worries about everything and she is working on getting her into [facility] with her sister.  Pain free now unless you touch her sternum.  She has mild exertional dyspnea.  Pain started last night Mild positional and pleuritic component.  No cough or fever.  In ER no acute ECG changes and enzymes negative.  She has had a CVA with left sided weakness and is on ASA and Plavix  Admitted by hospitalist for  Similar presentation 12/29 and Rx medically with continued Plavix / Nitrates per Dr Marlou Porch.    ROS All other systems reviewed and negative except as noted above  Past Medical History  Diagnosis Date  . ANEMIA, PERNICIOUS 09/10/2006  . CORONARY ARTERY DISEASE 11/07/2008    a. NSTEMI 2001 & STEMI 2008 managed medically. b. Takotsubo 06/2006. c. Cath 2009 -> med rx.  . CVA WITH LEFT HEMIPARESIS 07/22/2007    May, 2009, neurology decided aspirin and Plavix at that time.  . DERMATITIS 03/02/2007  . HYPERTENSION 07/15/2006  . MENINGIOMA 08/27/2006    Occipital meningioma, surgery, [facility], 2008 /  MRI, [facility], June, 2011, no change in lesion, history believe the patient had a gamma knife treatment of a right occipital atypical meningioma    . OSTEOARTHRITIS 07/15/2006  . PULMONARY NODULE 01/14/2007    [facility], December, 2011  . Takotsubo syndrome 04/22/2008    MI, May, 2008, question takotsubo event  . THYROID NODULE 01/14/2007  . IBS (irritable bowel syndrome)   . DJD (degenerative joint disease)     L shoulder impingement  . Ejection fraction     EF 60%, echo, July, 2010, (  EF improved after tach a suitable event 2008)  . Subarachnoid hemorrhage following injury 2007    Subarachnoid hemorrhage secondary to a fall December, 2007  . Bradycardia     August, 2013  . Family history of anesthesia complication     daughter has a hard time waking up  . H/O hiatal hernia   . Abnormal CT of the chest     Stable patchy/nodular and ground-glass opacities in the right upper lobe, While grossly unchanged, low grade adenocarcinoma remains possible.  . Stroke     Family History  Problem Relation Age of Onset  . Heart attack Brother 39    History   Social History  . Marital Status: Widowed    Spouse Name: N/A    Number of Children: 4  . Years of Education: N/A   Occupational History  .     Social History Main Topics  . Smoking status: Never Smoker   . Smokeless tobacco: Never Used  . Alcohol Use: No  .  Drug Use: No  . Sexual Activity: No   Other Topics Concern  . Not on file   Social History Narrative   Widowed. Lives in [place], [place] with daughter.     Past Surgical History  Procedure Laterality Date  . Cholecystectomy    . Abdominal hysterectomy    . Hip surgery    . Brain tumor excision      Meningioma; treated at [facility] in 2008  . Shoulder surgery    . Joint replacement      rt THR     . aspirin  324 mg Oral Once      Physical Exam: Blood pressure 155/93, pulse 64, temperature 97.4 F (36.3 C), temperature source Oral, resp. rate 16, SpO2 98.00%.  Affect appropriate Frail elderly [place] female  HEENT: normal Neck supple with no adenopathy JVP normal no bruits no thyromegaly Lungs clear  with no wheezing and good diaphragmatic motion Heart:  S1/S2 no murmur, no rub, gallop or click PMI normal Abdomen: benighn, BS positve, no tenderness, no AAA no bruit.  No HSM or HJR Distal pulses intact with no bruits No edema Neuro left sided weakness walks with cane  Skin warm and dry Pain palpation over sternum   Labs:   Lab Results  Component Value Date   WBC 6.6 08/13/2013   HGB 11.8* 08/13/2013   HCT 35.8* 08/13/2013   MCV 91.3 08/13/2013   PLT 229 08/13/2013    Recent Labs Lab 08/13/13 1139  NA 141  K 4.0  CL 102  CO2 27  BUN 11  CREATININE 0.78  CALCIUM 9.2  GLUCOSE 115*   Lab Results  Component Value Date   CKTOTAL 52 03/06/2011   CKMB 2.0 03/06/2011   TROPONINI <0.30 08/13/2013    Lab Results  Component Value Date   CHOL 142 03/06/2011   CHOL 197 09/15/2010   CHOL  Value: 188        ATP III CLASSIFICATION:  <200     mg/dL   Desirable  200-239  mg/dL   Borderline High  >=240    mg/dL   High        07/09/2008   Lab Results  Component Value Date   HDL 52 03/06/2011   HDL 69 09/15/2010   HDL 42 07/09/2008   Lab Results  Component Value Date   LDLCALC 68 03/06/2011   LDLCALC 109* 09/15/2010   LDLCALC  Value: 122        Total Cholesterol/HDL:CHD Risk Coronary Heart Disease Risk Table                     Men   Women  1/2 Average Risk   3.4   3.3  Average Risk       5.0   4.4  2 X Average Risk   9.6   7.1  3 X Average Risk  23.4   11.0        Use the calculated Patient Ratio above and the CHD Risk Table to determine the patient's CHD Risk.        ATP III CLASSIFICATION (LDL):  <100     mg/dL   Optimal  100-129  mg/dL   Near or Above                    Optimal  130-159  mg/dL   Borderline  160-189  mg/dL   High  >190     mg/dL   Very  High* 07/09/2008   Lab Results  Component Value Date   TRIG 112 03/06/2011   TRIG 96 09/15/2010   TRIG 120 07/09/2008   Lab Results  Component Value Date   CHOLHDL 2.7 03/06/2011   CHOLHDL 2.9 09/15/2010   CHOLHDL 4.5 07/09/2008   No results  found for this basename: LDLDIRECT      Radiology: Dg Chest Port 1 View  08/13/2013   CLINICAL DATA:  Chest heaviness, weakness, history hypertension, coronary artery disease, stroke  EXAM: PORTABLE CHEST - 1 VIEW  COMPARISON:  Portable exam 1221 hr compared to 01/30/2013  FINDINGS: Upper normal heart size.  Calcified thoracic aorta.  Mediastinal contours and pulmonary vascularity normal.  Lungs appear emphysematous but clear.  No pleural effusion or pneumothorax.  Bones demineralized.  IMPRESSION: COPD changes.  No acute abnormalities.   Electronically Signed   By: Lavonia Dana M.D.   On: 08/13/2013 12:45    EKG:  SR PVC no acute changes    ASSESSMENT AND PLAN:  Chest Pain:  Atypical last two caths with no occlusive disease enzymes negative no acute ECG changes and CXR ok  Will tentatively order lexiscan myovue for am Continue ASA/Plavix and nitrates  She has had bradycardia in past and beta blockers have not been used  Social:  Biggest issue is placement per daughter mom increasingly frail and anxious about everything  May be worth while to have social service consult for issues Regarding placement to [facility] in near future.  Discussed DNR and patient clearly doesn't want tube but daughter indicates full code and withdraw care if no hope  Signed: Jenkins Rouge 08/13/2013, 1:27 PM

## 2013-08-13 NOTE — ED Provider Notes (Signed)
CSN: [id number]     Arrival date & time 08/13/13  1046 History   First MD Initiated Contact with Patient 08/13/13 1231     Chief Complaint  Patient presents with  . Chest Pain     (Consider location/radiation/quality/duration/timing/severity/associated sxs/prior Treatment) HPI Comments: Patient presents to ER for evaluation of chest pain. Patient reports substernal chest discomfort that was relieved with nitroglycerin but returned. It started last night she took one nitroglycerin, the pain resolved and she went to bed. When she awoke this morning she had recurrence of pain and it has worsened. She has a history of previous NSTEMI x2. Patient does not have stents, she has been medical management.  Patient has had some mild shortness of breath, no nausea or diaphoresis.  Patient is a 80 y.o. female presenting with chest pain.  Chest Pain   Past Medical History  Diagnosis Date  . ANEMIA, PERNICIOUS 09/10/2006  . CORONARY ARTERY DISEASE 11/07/2008    a. NSTEMI 2001 & STEMI 2008 managed medically. b. Takotsubo 06/2006. c. Cath 2009 -> med rx.  . CVA WITH LEFT HEMIPARESIS 07/22/2007    May, 2009, neurology decided aspirin and Plavix at that time.  . DERMATITIS 03/02/2007  . HYPERTENSION 07/15/2006  . MENINGIOMA 08/27/2006    Occipital meningioma, surgery, [facility], 2008 /  MRI, [facility], June, 2011, no change in lesion, history believe the patient had a gamma knife treatment of a right occipital atypical meningioma  . OSTEOARTHRITIS 07/15/2006  . PULMONARY NODULE 01/14/2007    [facility], December, 2011  . Takotsubo syndrome 04/22/2008    MI, May, 2008, question takotsubo event  . THYROID NODULE 01/14/2007  . IBS (irritable bowel syndrome)   . DJD (degenerative joint disease)     L shoulder impingement  . Ejection fraction     EF 60%, echo, July, 2010, (  EF improved after tach a suitable event 2008)  . Subarachnoid hemorrhage following injury 2007    Subarachnoid hemorrhage secondary to a fall  December, 2007  . Bradycardia     August, 2013  . Family history of anesthesia complication     daughter has a hard time waking up  . H/O hiatal hernia   . Abnormal CT of the chest     Stable patchy/nodular and ground-glass opacities in the right upper lobe, While grossly unchanged, low grade adenocarcinoma remains possible.  . Stroke    Past Surgical History  Procedure Laterality Date  . Cholecystectomy    . Abdominal hysterectomy    . Hip surgery    . Brain tumor excision      Meningioma; [facility] at [facility] in [facility]  . Shoulder surgery    . Joint replacement      rt THR   Family History  Problem Relation Age of Onset  . Heart attack Brother 34   History  Substance Use Topics  . Smoking status: Never Smoker   . Smokeless tobacco: Never Used  . Alcohol Use: No   OB History   Grav Para Term Preterm Abortions TAB SAB Ect Mult Living                 Review of Systems  Cardiovascular: Positive for chest pain.  All other systems reviewed and are negative.     Allergies  Atorvastatin; Influenza vaccines; Phenytoin; Promethazine hcl; Sulfacetamide sodium; and Sulfamethoxazole  Home Medications   Prior to Admission medications   Medication Sig Start Date End Date Taking? Authorizing Provider  clopidogrel (PLAVIX) 75 MG  tablet Take 1 tablet (75 mg total) by mouth daily with breakfast. 05/04/13  Yes Marletta Lor, MD  cyanocobalamin (,VITAMIN B-12,) 1000 MCG/ML injection Inject 1 mL (1,000 mcg total) into the muscle every 30 (thirty) days. 01/05/13  Yes Marletta Lor, MD  cyclobenzaprine (FLEXERIL) 10 MG tablet Take 1 tablet (10 mg total) by mouth 3 (three) times daily as needed for muscle spasms. 02/09/13  Yes Marletta Lor, MD  isosorbide mononitrate (IMDUR) 30 MG 24 hr tablet Take 0.5 tablets (15 mg total) by mouth daily. 05/04/13  Yes Marletta Lor, MD  methimazole (TAPAZOLE) 5 MG tablet Take 5mg  only on Mon., Wed., and Fri. 05/04/13  Yes Marletta Lor, MD  nitroGLYCERIN (NITROSTAT) 0.4 MG SL tablet Place 1 tablet (0.4 mg total) under the tongue every 5 (five) minutes as needed. For chest pain. 05/04/13  Yes Marletta Lor, MD  pravastatin (PRAVACHOL) 20 MG tablet Take 1 tablet (20 mg total) by mouth daily. Every evening 05/04/13  Yes Marletta Lor, MD  traMADol (ULTRAM) 50 MG tablet Take 1 tablet (50 mg total) by mouth every 6 (six) hours as needed. For pain. 05/04/13  Yes Marletta Lor, MD  triamcinolone ointment (KENALOG) 0.1 % Apply 1 application topically 2 (two) times daily. For dry skin. 02/03/13  Yes Marletta Lor, MD  B-D SYRINGE LUER-LOK 3CC 3 ML MISC  01/05/13   Historical Provider, MD  BD DISP NEEDLES 25G X 5/8" Fate  01/05/13   Historical Provider, MD   BP 155/70  Pulse 67  Temp(Src) 97.4 F (36.3 C) (Oral)  Resp 18  SpO2 95% Physical Exam  Constitutional: She is oriented to person, place, and time. She appears well-developed and well-nourished. No distress.  HENT:  Head: Normocephalic and atraumatic.  Right Ear: Hearing normal.  Left Ear: Hearing normal.  Nose: Nose normal.  Mouth/Throat: Oropharynx is clear and moist and mucous membranes are normal.  Eyes: Conjunctivae and EOM are normal. Pupils are equal, round, and reactive to light.  Neck: Normal range of motion. Neck supple.  Cardiovascular: Regular rhythm, S1 normal and S2 normal.  Exam reveals no gallop and no friction rub.   No murmur heard. Pulmonary/Chest: Effort normal and breath sounds normal. No respiratory distress. She exhibits no tenderness.  Abdominal: Soft. Normal appearance and bowel sounds are normal. There is no hepatosplenomegaly. There is no tenderness. There is no rebound, no guarding, no tenderness at McBurney's point and negative Murphy's sign. No hernia.  Musculoskeletal: Normal range of motion.  Neurological: She is alert and oriented to person, place, and time. She has normal strength. No cranial nerve deficit or  sensory deficit. Coordination normal. GCS eye subscore is 4. GCS verbal subscore is 5. GCS motor subscore is 6.  Skin: Skin is warm, dry and intact. No rash noted. No cyanosis.  Psychiatric: She has a normal mood and affect. Her speech is normal and behavior is normal. Thought content normal.    ED Course  Procedures (including critical care time) Labs Review Labs Reviewed  CBC - Abnormal; Notable for the following:    Hemoglobin 11.8 (*)    HCT 35.8 (*)    All other components within normal limits  BASIC METABOLIC PANEL - Abnormal; Notable for the following:    Glucose, Bld 115 (*)    GFR calc non Af Amer 72 (*)    GFR calc Af Amer 83 (*)    All other components within normal limits  PRO  B NATRIURETIC PEPTIDE  TROPONIN I  I-STAT TROPOININ, ED    Imaging Review Dg Chest Port 1 View  08/13/2013   CLINICAL DATA:  Chest heaviness, weakness, history hypertension, coronary artery disease, stroke  EXAM: PORTABLE CHEST - 1 VIEW  COMPARISON:  Portable exam 1221 hr compared to 01/30/2013  FINDINGS: Upper normal heart size.  Calcified thoracic aorta.  Mediastinal contours and pulmonary vascularity normal.  Lungs appear emphysematous but clear.  No pleural effusion or pneumothorax.  Bones demineralized.  IMPRESSION: COPD changes.  No acute abnormalities.   Electronically Signed   By: Lavonia Dana M.D.   On: 08/13/2013 12:45     EKG Interpretation   Date/Time:  Friday August 13 2013 10:53:11 EDT Ventricular Rate:  66 PR Interval:  200 QRS Duration: 78 QT Interval:  408 QTC Calculation: 427 R Axis:   45 Text Interpretation:  Sinus rhythm with occasional Premature ventricular  complexes Otherwise normal ECG Confirmed by Rania Prothero  MD, Leopold Smyers  [phone]) on 08/13/2013 12:34:33 PM      MDM   Final diagnoses:  Chest pain at rest   Presents to the ER for evaluation of chest pain. She had chest pain last night which resolved with nitroglycerin. It recurred today. She was given sublingual  nitroglycerin here and has had resolution of her pain. There is some slight tenderness still present. With the previous history, I did consult cardiology. She has been seen by cardiology, recommend meds at admission. They have ordered nuclear medicine stress test for tomorrow.    Orpah Greek, MD 08/13/13 [phone]

## 2013-08-14 ENCOUNTER — Observation Stay (HOSPITAL_COMMUNITY): Payer: Medicare Other

## 2013-08-14 DIAGNOSIS — R079 Chest pain, unspecified: Secondary | ICD-10-CM

## 2013-08-14 LAB — TROPONIN I
Troponin I: <0.3 ng/mL (ref <0.30)
Troponin I: <0.3 ng/mL (ref <0.30)

## 2013-08-14 MED ORDER — TECHNETIUM TC 99M SESTAMIBI GENERIC - CARDIOLITE
10.0000 | Freq: Once | INTRAVENOUS | Status: AC | PRN
  Administered 2013-08-14: 10 via INTRAVENOUS

## 2013-08-14 MED ORDER — REGADENOSON 0.4 MG/5ML IV SOLN
INTRAVENOUS | Status: AC
  Filled 2013-08-14: qty 5

## 2013-08-14 MED ORDER — REGADENOSON 0.4 MG/5ML IV SOLN
0.4000 mg | Freq: Once | INTRAVENOUS | Status: AC
  Administered 2013-08-14: 0.4 mg via INTRAVENOUS

## 2013-08-14 MED ORDER — SODIUM CHLORIDE 0.9 % IJ SOLN
80.0000 mg | INTRAVENOUS | Status: AC
  Administered 2013-08-14: 80 mg via INTRAVENOUS

## 2013-08-14 NOTE — Progress Notes (Signed)
Patient ID: [name], female   DOB: 05/17/1922, 80 y.o.   MRN: [id number]    Subjective:  Weak  Going for myovue   Objective:  Filed Vitals:   08/13/13 1530 08/13/13 1649 08/13/13 1942 08/14/13 0442  BP: 143/68 171/75 155/60 125/67  Pulse: 60 57 63 58  Temp:  98 F (36.7 C) 97.9 F (36.6 C) 97.7 F (36.5 C)  TempSrc:  Oral Oral Oral  Resp: 15 16 16 16   Height:  5\' 5"  (1.651 m)    Weight:  120 lb 11.2 oz (54.749 kg)    SpO2: 95% 96% 95% 93%    Intake/Output from previous day:  Intake/Output Summary (Last 24 hours) at 08/14/13 0848 Last data filed at 08/13/13 1835  Gross per 24 hour  Intake    100 ml  Output      0 ml  Net    100 ml    Physical Exam: Affect appropriate Frail elderly polish female  HEENT: normal Neck supple with no adenopathy JVP normal no bruits no thyromegaly Lungs clear with no wheezing and good diaphragmatic motion Heart:  S1/S2 no murmur, no rub, gallop or click PMI normal Abdomen: benighn, BS positve, no tenderness, no AAA no bruit.  No HSM or HJR Distal pulses intact with no bruits No edema Neuro non-focal Skin warm and dry No muscular weakness   Lab Results: Basic Metabolic Panel:  Recent Labs  08/13/13 1139 08/13/13 1848  NA 141  --   K 4.0  --   CL 102  --   CO2 27  --   GLUCOSE 115*  --   BUN 11  --   CREATININE 0.78 0.80  CALCIUM 9.2  --    CBC:  Recent Labs  08/13/13 1139 08/13/13 1848  WBC 6.6 5.9  HGB 11.8* 11.7*  HCT 35.8* 35.5*  MCV 91.3 92.9  PLT 229 196   Cardiac Enzymes:  Recent Labs  08/13/13 1848 08/13/13 2321 08/14/13 0543  TROPONINI <0.30 <0.30 <0.30    Imaging: Dg Chest Port 1 View  08/13/2013   CLINICAL DATA:  Chest heaviness, weakness, history hypertension, coronary artery disease, stroke  EXAM: PORTABLE CHEST - 1 VIEW  COMPARISON:  Portable exam 1221 hr compared to 01/30/2013  FINDINGS: Upper normal heart size.  Calcified thoracic aorta.  Mediastinal contours and pulmonary  vascularity normal.  Lungs appear emphysematous but clear.  No pleural effusion or pneumothorax.  Bones demineralized.  IMPRESSION: COPD changes.  No acute abnormalities.   Electronically Signed   By: Lavonia Dana M.D.   On: 08/13/2013 12:45    Cardiac Studies:  ECG:    Telemetry:  NSR no arrhythmia   Echo:  02/01/13   Impressions:  - Normal LV size and systolic function, EF 74%. Mild apical lateral hypokinesis. Normal RV size and systolic function. Mild MR.   Medications:   . clopidogrel  75 mg Oral Q breakfast  . [START ON 09/04/2013] cyanocobalamin  1,000 mcg Intramuscular Q30 days  . heparin  5,000 Units Subcutaneous 3 times per day  . isosorbide mononitrate  15 mg Oral Daily  . [START ON 08/16/2013] methimazole  5 mg Oral Q M,W,F  . pravastatin  20 mg Oral Daily       Assessment/Plan:  Chest Pain: Atypical last two caths with no occlusive disease  History of Takatsubo DCM  Enzymes negative no acute ECG changes and CXR ok  Lexiscan today ok to d/c home if normal or low risk  Would not cath unless scan is high risk.  Placement issues per family and primary service    Jenkins Rouge 08/14/2013, 8:48 AM

## 2013-08-14 NOTE — Progress Notes (Signed)
Pt stress test was abnormal. Results below.  FINDINGS:  Myocardial profusion SPECT images obtained following pharmacologic  stress demonstrate a large perfusion defect involving the inferior  and lateral walls of the LEFT ventricle.  Resting exam is unchanged.  No pulmonary uptake of tracer.  As gated imaging could not be performed, unable to assess for LEFT  ventricular ejection fraction and wall motion.  IMPRESSION:  Large non reversible myocardial perfusion defect involving the  inferior and lateral walls of the LEFT ventricle.  Irregular heart rate, preventing adequate gating; no gated SPECT  images were obtained, therefore unable to assess LEFT ventricular  ejection fraction and LV wall motion.  Electronically Signed  By: Lavonia Dana M.D.  On: 08/14/2013 12:56  Reviewed data with MD. Since EF previously normal and unable to gate the study, will check echo to help determine next step. Pt aware.  Rosaria Ferries, PA-C 08/14/2013 7:29 PM Beeper [phone]

## 2013-08-14 NOTE — Progress Notes (Signed)
[facility] PROGRESS NOTE  [name] EVO:[id number] DOB: [birth year] DOA: 08/13/2013 PCP: Nyoka Cowden, MD  Assessment/Plan: Chest Pain  - Cardiology on board  - Lexyscan complete - Currently plan is for echocardiogram to assess LV function.  H/o CVA c left hemiparesis  - Plavix   GERD  - stable, may have contributed to principle problem   Hyperthyroidism  - Methimazole on board   DVT prophylaxis  - Heparin SQ   Code Status: full Family Communication: discussed with daugther Disposition Plan: Pending improvement in condition.   Consultants:  Cardiology  Procedures:  Lexiscan  Echocardiogram pending  Antibiotics:  pending  HPI/Subjective: No new complaints.  Objective: Filed Vitals:   08/14/13 1315  BP: 112/65  Pulse: 80  Temp: 97.4 F (36.3 C)  Resp: 18    Intake/Output Summary (Last 24 hours) at 08/14/13 1522 Last data filed at 08/14/13 1400  Gross per 24 hour  Intake    320 ml  Output      0 ml  Net    320 ml   Filed Weights   08/13/13 1649  Weight: 54.749 kg (120 lb 11.2 oz)    Exam:   General:  Pt in nAD, alert and awake  Cardiovascular: rrr, no mrg  Respiratory: cta bl, no wheezes  Abdomen: soft, NT, ND  Musculoskeletal: no cyanosis or clubbing   Data Reviewed: Basic Metabolic Panel:  Recent Labs Lab 08/13/13 1139 08/13/13 1848  NA 141  --   K 4.0  --   CL 102  --   CO2 27  --   GLUCOSE 115*  --   BUN 11  --   CREATININE 0.78 0.80  CALCIUM 9.2  --    Liver Function Tests: No results found for this basename: AST, ALT, ALKPHOS, BILITOT, PROT, ALBUMIN,  in the last 168 hours No results found for this basename: LIPASE, AMYLASE,  in the last 168 hours No results found for this basename: AMMONIA,  in the last 168 hours CBC:  Recent Labs Lab 08/13/13 1139 08/13/13 1848  WBC 6.6 5.9  HGB 11.8* 11.7*  HCT 35.8* 35.5*  MCV 91.3 92.9  PLT 229 196   Cardiac Enzymes:  Recent Labs Lab  08/13/13 1139 08/13/13 1848 08/13/13 2321 08/14/13 0543  TROPONINI <0.30 <0.30 <0.30 <0.30   BNP (last 3 results)  Recent Labs  01/16/13 1554 01/30/13 1627 08/13/13 1139  PROBNP 373.9 604.2* 368.6   CBG: No results found for this basename: GLUCAP,  in the last 168 hours  No results found for this or any previous visit (from the past 240 hour(s)).   Studies: Nm Myocar Multi W/spect W/wall Motion / Ef  08/14/2013   EXAM: MYOCARDIAL IMAGING WITH SPECT (REST AND PHARMACOLOGIC-STRESS)  GATED LEFT VENTRICULAR WALL MOTION STUDY  LEFT VENTRICULAR EJECTION FRACTION  TECHNIQUE: Standard myocardial SPECT imaging was performed after resting intravenous injection of 10 mCi Tc-67m sestamibi. Subsequently, intravenous infusion of Lexiscan was performed under the supervision of the Cardiology staff. At peak effect of the drug, 30 mCi Tc-35m sestamibi was injected intravenously and standard myocardial SPECT imaging was performed. Quantitative gated imaging was unable to be performed due to irregular heart rate.  COMPARISON:  None  FINDINGS: Myocardial profusion SPECT images obtained following pharmacologic stress demonstrate a large perfusion defect involving the inferior and lateral walls of the LEFT ventricle.  Resting exam is unchanged.  No pulmonary uptake of tracer.  As gated imaging could not be performed, unable to assess for LEFT  ventricular ejection fraction and wall motion.  IMPRESSION: Large non reversible myocardial perfusion defect involving the inferior and lateral walls of the LEFT ventricle.  Irregular heart rate, preventing adequate gating; no gated SPECT images were obtained, therefore unable to assess LEFT ventricular ejection fraction and LV wall motion.   Electronically Signed   By: Lavonia Dana M.D.   On: 08/14/2013 12:56   Dg Chest Port 1 View  08/13/2013   CLINICAL DATA:  Chest heaviness, weakness, history hypertension, coronary artery disease, stroke  EXAM: PORTABLE CHEST - 1 VIEW   COMPARISON:  Portable exam 1221 hr compared to 01/30/2013  FINDINGS: Upper normal heart size.  Calcified thoracic aorta.  Mediastinal contours and pulmonary vascularity normal.  Lungs appear emphysematous but clear.  No pleural effusion or pneumothorax.  Bones demineralized.  IMPRESSION: COPD changes.  No acute abnormalities.   Electronically Signed   By: Lavonia Dana M.D.   On: 08/13/2013 12:45    Scheduled Meds: . clopidogrel  75 mg Oral Q breakfast  . [START ON 09/04/2013] cyanocobalamin  1,000 mcg Intramuscular Q30 days  . heparin  5,000 Units Subcutaneous 3 times per day  . isosorbide mononitrate  15 mg Oral Daily  . [START ON 08/16/2013] methimazole  5 mg Oral Q M,W,F  . pravastatin  20 mg Oral Daily   Continuous Infusions:    Time spent: > 35 minutes    Velvet Bathe  [facility] Pager [phone] If 7PM-7AM, please contact night-coverage at [email or web address], password [facility] 08/14/2013, 3:22 PM  LOS: 1 day

## 2013-08-14 NOTE — Progress Notes (Signed)
Lexiscan CL performed 

## 2013-08-14 NOTE — Care Management Utilization Note (Signed)
UR completed.    Amazin Pincock Wise Kevionna Heffler, RN, BSN Phone #[phone]  

## 2013-08-15 DIAGNOSIS — I369 Nonrheumatic tricuspid valve disorder, unspecified: Secondary | ICD-10-CM

## 2013-08-15 DIAGNOSIS — R079 Chest pain, unspecified: Secondary | ICD-10-CM | POA: Diagnosis not present

## 2013-08-15 NOTE — Progress Notes (Signed)
  Echocardiogram 2D Echocardiogram has been performed.  [name] M 08/15/2013, 10:12 AM

## 2013-08-15 NOTE — Progress Notes (Signed)
Pt had 5 bt burst SVT and run of trigeminal PVCs.  Pt asymptomatic with stable VS.  Will continue to monitor.

## 2013-08-15 NOTE — Progress Notes (Signed)
PT discharged home in stable/hemodynamically stable.

## 2013-08-15 NOTE — Discharge Summary (Addendum)
Physician Discharge Summary  [name] HER:[id number] DOB: 10-Nov-1922 DOA: 08/13/2013  PCP: Nyoka Cowden, MD  Admit date: 08/13/2013 Discharge date: 08/15/2013  Time spent: > 35 minutes  Recommendations for Outpatient Follow-up:  1. Please be sure to follow up with your cardiologist in 2 weeks with Dr. Ron Parker  Discharge Diagnoses:  Addendum please see list reported below.  Discharge Condition: stable  Diet recommendation: Heart healthy  Filed Weights   08/13/13 1649  Weight: 54.749 kg (120 lb 11.2 oz)    History of present illness:  80 y/o with history of Takatsubo DCM in the last 2 Caths with no occlusive disease. Presenting to the hospital complaining of chest discomfort.  Hospital Course:   chest pain - Cardiology on board and managing. Cardiology reported the following: Lexiscan with likely old inferolateral MI No ischemia Echo this am if EF ok will d/c home after lunch Nurse to page echo tech to prioritize study -Echocardiogram showing EF of 50-55%.  For other known comorbidities we'll continue home regimen  Procedures:  As listed above  Consultations:  Cardiology  Discharge Exam: Filed Vitals:   08/15/13 0527  BP: 144/76  Pulse: 64  Temp: 97.2 F (36.2 C)  Resp: 15    General: Patient in no acute distress, alert and awake Cardiovascular: Regular rate and rhythm, no murmurs rubs Respiratory: Clear to auscultation bilaterally no wheezes  Discharge Instructions You were cared for by a hospitalist during your hospital stay. If you have any questions about your discharge medications or the care you received while you were in the hospital after you are discharged, you can call the unit and asked to speak with the hospitalist on call if the hospitalist that took care of you is not available. Once you are discharged, your primary care physician will handle any further medical issues. Please note that NO REFILLS for any discharge medications will be  authorized once you are discharged, as it is imperative that you return to your primary care physician (or establish a relationship with a primary care physician if you do not have one) for your aftercare needs so that they can reassess your need for medications and monitor your lab values.  Discharge Instructions   Diet - low sodium heart healthy    Complete by:  As directed      Increase activity slowly    Complete by:  As directed             Medication List         B-D SYRINGE LUER-LOK 3CC 3 ML Misc  Generic drug:  Syringe (Disposable)     BD DISP NEEDLES 25G X 5/8" Misc  Generic drug:  NEEDLE (DISP) 25 G     clopidogrel 75 MG tablet  Commonly known as:  PLAVIX  Take 1 tablet (75 mg total) by mouth daily with breakfast.     cyanocobalamin 1000 MCG/ML injection  Commonly known as:  (VITAMIN B-12)  Inject 1 mL (1,000 mcg total) into the muscle every 30 (thirty) days.     cyclobenzaprine 10 MG tablet  Commonly known as:  FLEXERIL  Take 1 tablet (10 mg total) by mouth 3 (three) times daily as needed for muscle spasms.     isosorbide mononitrate 30 MG 24 hr tablet  Commonly known as:  IMDUR  Take 0.5 tablets (15 mg total) by mouth daily.     methimazole 5 MG tablet  Commonly known as:  TAPAZOLE  Take 5mg  only on Mon., Wed., and  Fri.     nitroGLYCERIN 0.4 MG SL tablet  Commonly known as:  NITROSTAT  Place 1 tablet (0.4 mg total) under the tongue every 5 (five) minutes as needed. For chest pain.     pravastatin 20 MG tablet  Commonly known as:  PRAVACHOL  Take 1 tablet (20 mg total) by mouth daily. Every evening     traMADol 50 MG tablet  Commonly known as:  ULTRAM  Take 1 tablet (50 mg total) by mouth every 6 (six) hours as needed. For pain.     triamcinolone ointment 0.1 %  Commonly known as:  KENALOG  Apply 1 application topically 2 (two) times daily. For dry skin.       Allergies  Allergen Reactions  . Atorvastatin Other (See Comments)    Raises liver  enzymes (takes pravastatin at home)  . Influenza Vaccines Other (See Comments)    Unknown  . Phenytoin Other (See Comments)    Blood clots  . Promethazine Hcl Other (See Comments)    Severe sedation  . Sulfacetamide Sodium     REACTION: sulfa  . Sulfamethoxazole     REACTION: unspecified      The results of significant diagnostics from this hospitalization (including imaging, microbiology, ancillary and laboratory) are listed below for reference.    Significant Diagnostic Studies: Nm Myocar Multi W/spect W/wall Motion / Ef  08/14/2013   EXAM: MYOCARDIAL IMAGING WITH SPECT (REST AND PHARMACOLOGIC-STRESS)  GATED LEFT VENTRICULAR WALL MOTION STUDY  LEFT VENTRICULAR EJECTION FRACTION  TECHNIQUE: Standard myocardial SPECT imaging was performed after resting intravenous injection of 10 mCi Tc-69m sestamibi. Subsequently, intravenous infusion of Lexiscan was performed under the supervision of the Cardiology staff. At peak effect of the drug, 30 mCi Tc-45m sestamibi was injected intravenously and standard myocardial SPECT imaging was performed. Quantitative gated imaging was unable to be performed due to irregular heart rate.  COMPARISON:  None  FINDINGS: Myocardial profusion SPECT images obtained following pharmacologic stress demonstrate a large perfusion defect involving the inferior and lateral walls of the LEFT ventricle.  Resting exam is unchanged.  No pulmonary uptake of tracer.  As gated imaging could not be performed, unable to assess for LEFT ventricular ejection fraction and wall motion.  IMPRESSION: Large non reversible myocardial perfusion defect involving the inferior and lateral walls of the LEFT ventricle.  Irregular heart rate, preventing adequate gating; no gated SPECT images were obtained, therefore unable to assess LEFT ventricular ejection fraction and LV wall motion.   Electronically Signed   By: Lavonia Dana M.D.   On: 08/14/2013 12:56   Dg Chest Port 1 View  08/13/2013   CLINICAL  DATA:  Chest heaviness, weakness, history hypertension, coronary artery disease, stroke  EXAM: PORTABLE CHEST - 1 VIEW  COMPARISON:  Portable exam 1221 hr compared to 01/30/2013  FINDINGS: Upper normal heart size.  Calcified thoracic aorta.  Mediastinal contours and pulmonary vascularity normal.  Lungs appear emphysematous but clear.  No pleural effusion or pneumothorax.  Bones demineralized.  IMPRESSION: COPD changes.  No acute abnormalities.   Electronically Signed   By: Lavonia Dana M.D.   On: 08/13/2013 12:45    Microbiology: No results found for this or any previous visit (from the past 240 hour(s)).   Labs: Basic Metabolic Panel:  Recent Labs Lab 08/13/13 1139 08/13/13 1848  NA 141  --   K 4.0  --   CL 102  --   CO2 27  --   GLUCOSE 115*  --  BUN 11  --   CREATININE 0.78 0.80  CALCIUM 9.2  --    Liver Function Tests: No results found for this basename: AST, ALT, ALKPHOS, BILITOT, PROT, ALBUMIN,  in the last 168 hours No results found for this basename: LIPASE, AMYLASE,  in the last 168 hours No results found for this basename: AMMONIA,  in the last 168 hours CBC:  Recent Labs Lab 08/13/13 1139 08/13/13 1848  WBC 6.6 5.9  HGB 11.8* 11.7*  HCT 35.8* 35.5*  MCV 91.3 92.9  PLT 229 196   Cardiac Enzymes:  Recent Labs Lab 08/13/13 1139 08/13/13 1848 08/13/13 2321 08/14/13 0543  TROPONINI <0.30 <0.30 <0.30 <0.30   BNP: BNP (last 3 results)  Recent Labs  01/16/13 1554 01/30/13 1627 08/13/13 1139  PROBNP 373.9 604.2* 368.6   CBG: No results found for this basename: GLUCAP,  in the last 168 hours     Signed:  Velvet Bathe  [facility] 08/15/2013, 12:57 PM

## 2013-08-15 NOTE — Progress Notes (Signed)
Patient ID: [name], female   DOB: [birth year], 80 y.o.   MRN: [id number]    Subjective:  No pain wants to go home   Objective:  Filed Vitals:   08/14/13 1018 08/14/13 1315 08/14/13 2110 08/15/13 0527  BP: 161/74 112/65 125/65 144/76  Pulse: 85 80 68 64  Temp:  97.4 F (36.3 C) 97.8 F (36.6 C) 97.2 F (36.2 C)  TempSrc:  Oral Oral Oral  Resp:  18 16 15   Height:      Weight:      SpO2:  96% 97% 98%    Intake/Output from previous day:  Intake/Output Summary (Last 24 hours) at 08/15/13 1540 Last data filed at 08/15/13 0867  Gross per 24 hour  Intake    440 ml  Output      0 ml  Net    440 ml    Physical Exam: Affect appropriate Frail elderly polish female  HEENT: normal Neck supple with no adenopathy JVP normal no bruits no thyromegaly Lungs clear with no wheezing and good diaphragmatic motion Heart:  S1/S2 no murmur, no rub, gallop or click PMI normal Abdomen: benighn, BS positve, no tenderness, no AAA no bruit.  No HSM or HJR Distal pulses intact with no bruits No edema Neuro non-focal Skin warm and dry No muscular weakness   Lab Results: Basic Metabolic Panel:  Recent Labs  08/13/13 1139 08/13/13 1848  NA 141  --   K 4.0  --   CL 102  --   CO2 27  --   GLUCOSE 115*  --   BUN 11  --   CREATININE 0.78 0.80  CALCIUM 9.2  --    CBC:  Recent Labs  08/13/13 1139 08/13/13 1848  WBC 6.6 5.9  HGB 11.8* 11.7*  HCT 35.8* 35.5*  MCV 91.3 92.9  PLT 229 196   Cardiac Enzymes:  Recent Labs  08/13/13 1848 08/13/13 2321 08/14/13 0543  TROPONINI <0.30 <0.30 <0.30    Imaging: Nm Myocar Multi W/spect W/wall Motion / Ef  08/14/2013   EXAM: MYOCARDIAL IMAGING WITH SPECT (REST AND PHARMACOLOGIC-STRESS)  GATED LEFT VENTRICULAR WALL MOTION STUDY  LEFT VENTRICULAR EJECTION FRACTION  TECHNIQUE: Standard myocardial SPECT imaging was performed after resting intravenous injection of 10 mCi Tc-65m sestamibi. Subsequently, intravenous infusion of  Lexiscan was performed under the supervision of the Cardiology staff. At peak effect of the drug, 30 mCi Tc-22m sestamibi was injected intravenously and standard myocardial SPECT imaging was performed. Quantitative gated imaging was unable to be performed due to irregular heart rate.  COMPARISON:  None  FINDINGS: Myocardial profusion SPECT images obtained following pharmacologic stress demonstrate a large perfusion defect involving the inferior and lateral walls of the LEFT ventricle.  Resting exam is unchanged.  No pulmonary uptake of tracer.  As gated imaging could not be performed, unable to assess for LEFT ventricular ejection fraction and wall motion.  IMPRESSION: Large non reversible myocardial perfusion defect involving the inferior and lateral walls of the LEFT ventricle.  Irregular heart rate, preventing adequate gating; no gated SPECT images were obtained, therefore unable to assess LEFT ventricular ejection fraction and LV wall motion.   Electronically Signed   By: Lavonia Dana M.D.   On: 08/14/2013 12:56   Dg Chest Port 1 View  08/13/2013   CLINICAL DATA:  Chest heaviness, weakness, history hypertension, coronary artery disease, stroke  EXAM: PORTABLE CHEST - 1 VIEW  COMPARISON:  Portable exam 1221 hr compared to 01/30/2013  FINDINGS: Upper  normal heart size.  Calcified thoracic aorta.  Mediastinal contours and pulmonary vascularity normal.  Lungs appear emphysematous but clear.  No pleural effusion or pneumothorax.  Bones demineralized.  IMPRESSION: COPD changes.  No acute abnormalities.   Electronically Signed   By: Lavonia Dana M.D.   On: 08/13/2013 12:45    Cardiac Studies:  ECG: SR no acute changes PVC    Telemetry:  NSR no arrhythmia   Echo:  02/01/13   Impressions:  - Normal LV size and systolic function, EF 27%. Mild apical lateral hypokinesis. Normal RV size and systolic function. Mild MR.   Medications:   . clopidogrel  75 mg Oral Q breakfast  . [START ON 09/04/2013]  cyanocobalamin  1,000 mcg Intramuscular Q30 days  . heparin  5,000 Units Subcutaneous 3 times per day  . isosorbide mononitrate  15 mg Oral Daily  . [START ON 08/16/2013] methimazole  5 mg Oral Q M,W,F  . pravastatin  20 mg Oral Daily       Assessment/Plan:  Chest Pain: Atypical last two caths with no occlusive disease  History of Takatsubo DCM  Enzymes negative no acute ECG changes and CXR ok  Lexiscan with likely old inferolateral MI  No ischemia  Echo this am if EF ok will d/c home after lunch  Nurse to page echo tech to prioritize study Outpatient f/u April Manson [facility] 08/15/2013, 9:07 AM

## 2013-10-10 IMAGING — CT CT ABD-PELV W/ CM
1 of 3 series · 13 of 32 positions shown, 18 images · IV contrast (OMNIPAQUE 300)
Comparison: 06/22/2006.

CLINICAL DATA: Left flank pain.  Hysterectomy and cholecystectomy
history.  White blood cells in the urine.

CT ABDOMEN AND PELVIS WITH CONTRAST
TECHNIQUE: Multidetector CT imaging of the abdomen and pelvis was
performed following the standard protocol during bolus
administration of intravenous contrast.
Contrast: 100mL OMNIPAQUE IOHEXOL 300 MG/ML  SOLN

[Series 2: abd/pel with · axial · 0.66mm/px · z∈[-432,-87]mm · 13 of 77 slices shown, 18 images]
[im 4/77  soft-tissue]
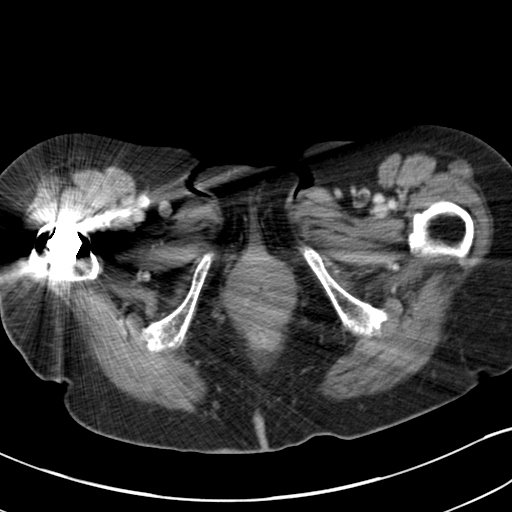
[im 4/77  bone]
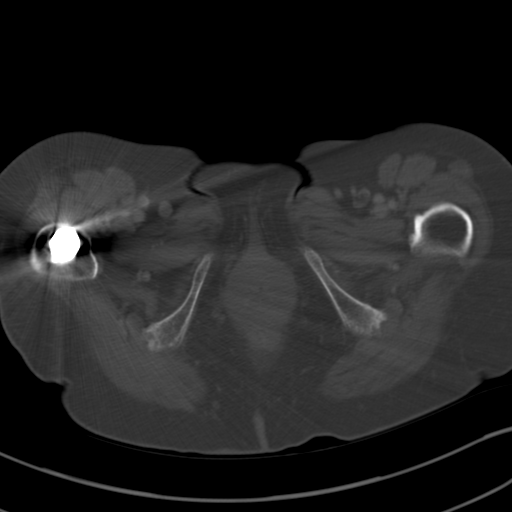
[im 12/77  soft-tissue]
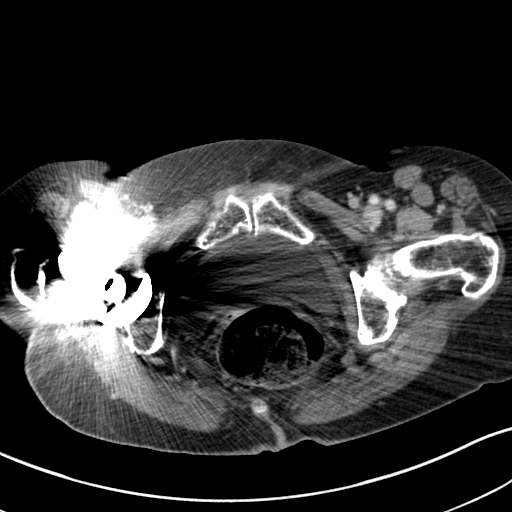
[im 16/77  soft-tissue]
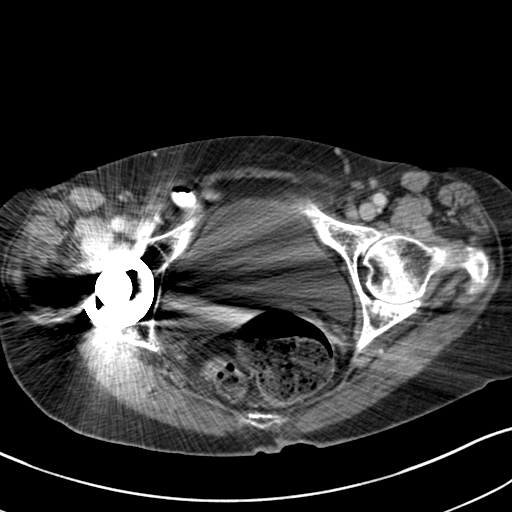
[im 23/77  soft-tissue]
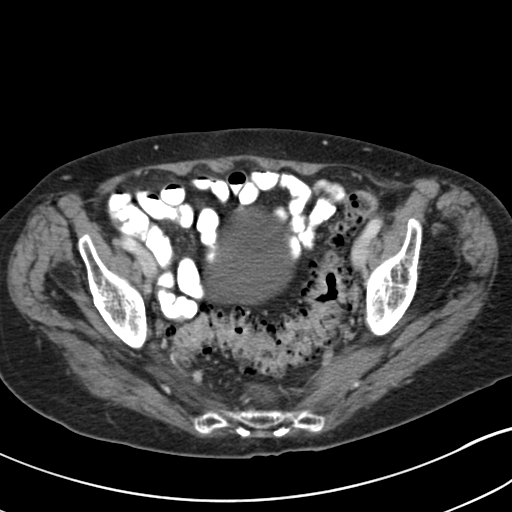
[im 31/77  soft-tissue]
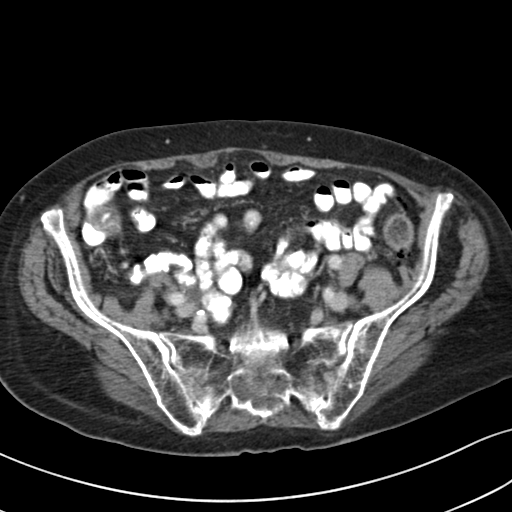
[im 35/77  soft-tissue]
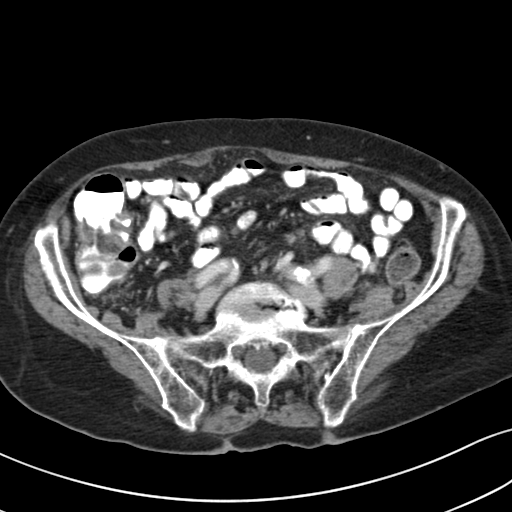
[im 42/77  soft-tissue]
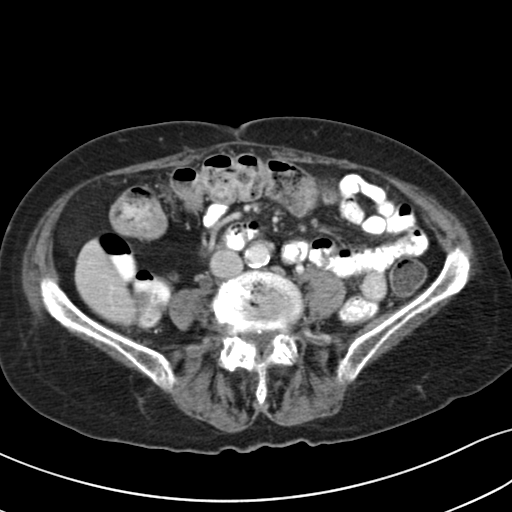
[im 46/77  soft-tissue]
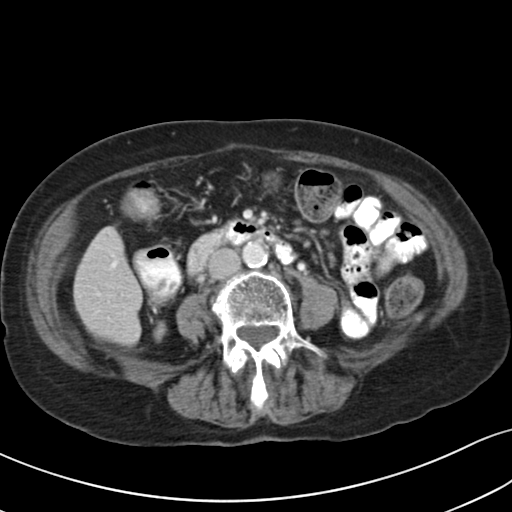
[im 54/77  soft-tissue]
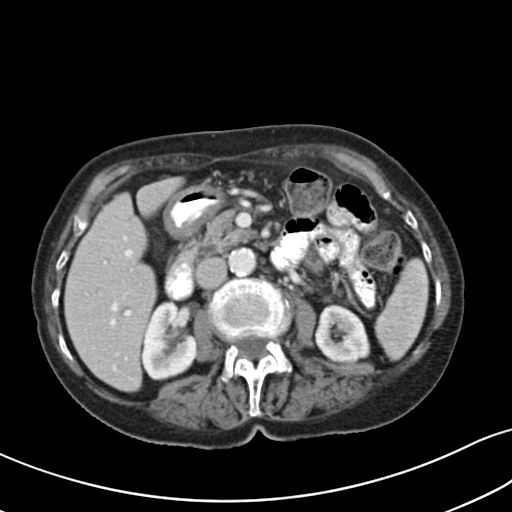
[im 54/77  bone]
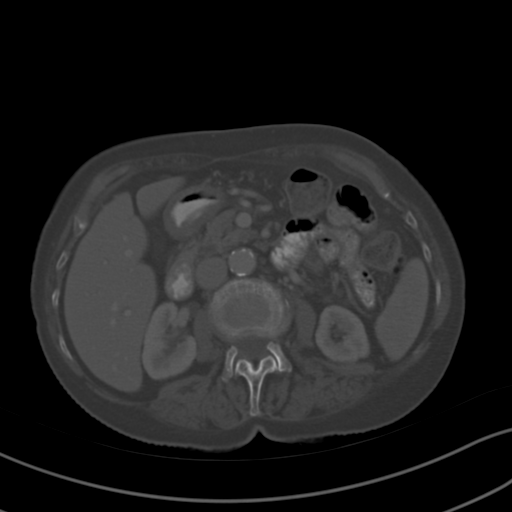
[im 61/77  soft-tissue]
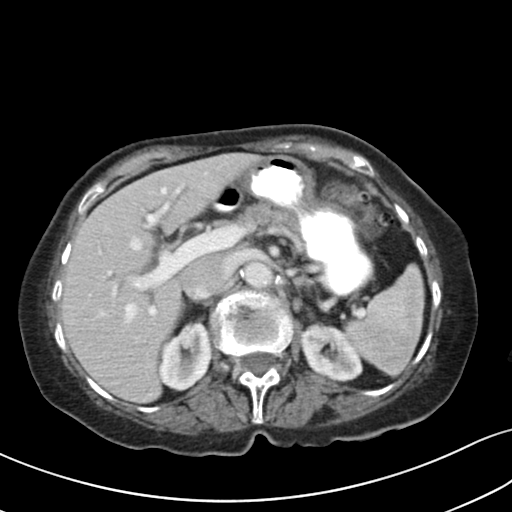
[im 61/77  lung]
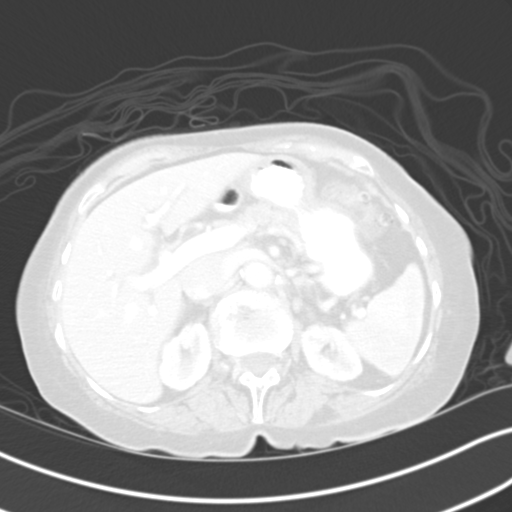
[im 65/77  soft-tissue]
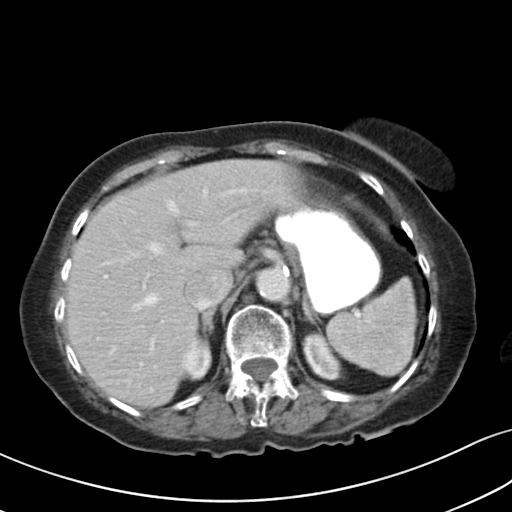
[im 65/77  lung]
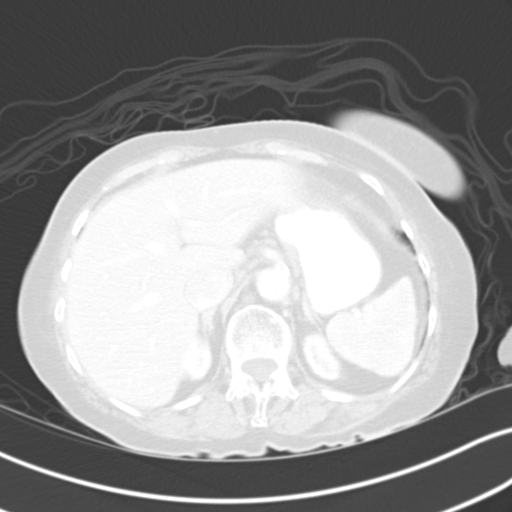
[im 69/77  lung]
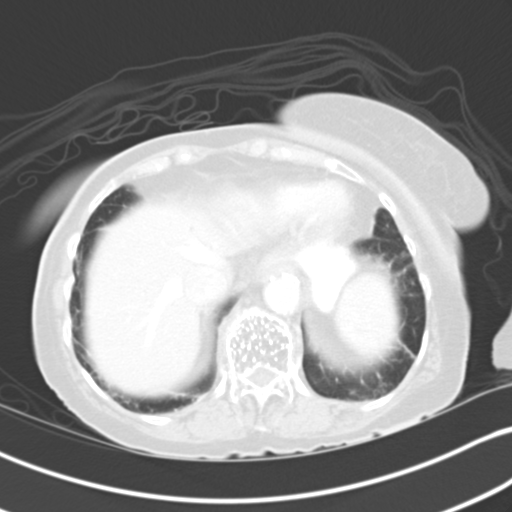
[im 73/77  soft-tissue]
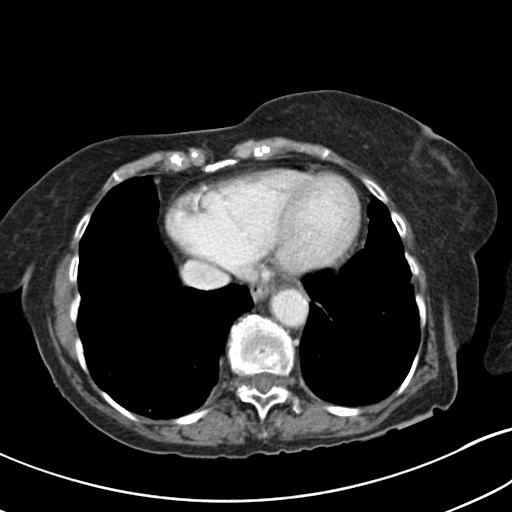
[im 73/77  lung]
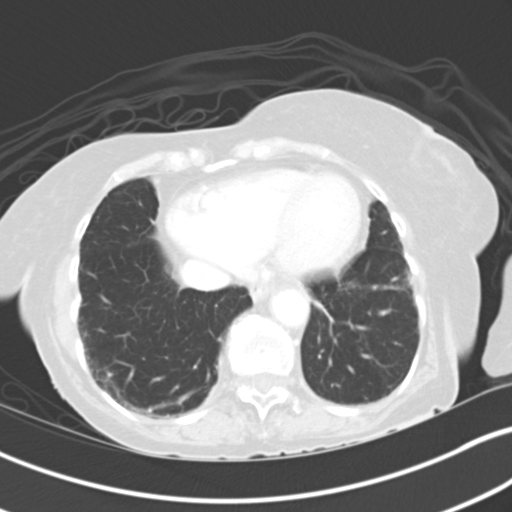

[13 of 32 positions shown; findings below may reference images not displayed]

FINDINGS: Lung Bases: Dependent atelectasis.

Liver:  Mild postcholecystectomy intrahepatic biliary ductal
dilation.  Scattered sub centimeter low density lesions, likely
representing cysts.  No mass lesions.

Spleen:  Normal.

Gallbladder:  Surgically absent.

Common bile duct:  Post cholecystectomy dilation.

Pancreas:  There are no cystic lesions in the tail of the pancreas
(image number 20 series 2), suggesting focal ductal dilation.
Intraductal papillary mucinous neoplasm cannot be excluded.  Follow
up pancreatic protocol MRI is recommended for further assessment.
These lesions are new compared to 06/20/2006 CT.  Prior MRCP showed
normal caliber of the pancreatic duct.

Adrenal glands:  Normal.

Kidneys:  Bilateral renal cortical atrophy.  Normal enhancement and
excretion.

Stomach:  Normal.

Small bowel:  Normal.  No mesenteric adenopathy.

Colon:   Mild nonspecific thickening of the ascending colon, just
distal to the cecum (image 41 series 2, image 36 series 5).
Severe colonic diverticulosis of the sigmoid and descending colon.

Pelvic Genitourinary:  Hysterectomy.  Urinary bladder distended.
Artifact in the anatomic pelvis from right total hip arthroplasty.

Bones:  Osteopenia.  Multilevel lumbar spondylosis.  T12 vertebral
body hemangioma occupying the entire vertebral body.

Vasculature: Atherosclerosis.  No aneurysm.
IMPRESSION: 1.  No acute abnormality.
2.  Tiny hepatic lesions are too small to characterize, likely
representing cysts.
3.  Cholecystectomy.  The intra and extrahepatic
postcholecystectomy dilation of the biliary system.
4.  New focal dilation of the pancreatic duct in the tail of the
pancreas.  Followup MRI of the pancreas with MRCP recommended if it
will affect management in this 89 year old. Non-emergent MRI should
be deferred until patient has been discharged for the acute
illness, and can optimally cooperate with positioning and breath-
holding instructions.

## 2013-10-18 ENCOUNTER — Ambulatory Visit (INDEPENDENT_AMBULATORY_CARE_PROVIDER_SITE_OTHER): Payer: Medicare Other | Admitting: Internal Medicine

## 2013-10-18 ENCOUNTER — Encounter: Payer: Self-pay | Admitting: Internal Medicine

## 2013-10-18 VITALS — BP 120/66 | HR 72 | Temp 98.1°F | Resp 18 | Ht 65.0 in | Wt 117.0 lb

## 2013-10-18 DIAGNOSIS — M1990 Unspecified osteoarthritis, unspecified site: Secondary | ICD-10-CM

## 2013-10-18 DIAGNOSIS — L84 Corns and callosities: Secondary | ICD-10-CM

## 2013-10-18 DIAGNOSIS — I69959 Hemiplegia and hemiparesis following unspecified cerebrovascular disease affecting unspecified side: Secondary | ICD-10-CM

## 2013-10-18 DIAGNOSIS — D510 Vitamin B12 deficiency anemia due to intrinsic factor deficiency: Secondary | ICD-10-CM

## 2013-10-18 DIAGNOSIS — E785 Hyperlipidemia, unspecified: Secondary | ICD-10-CM

## 2013-10-18 DIAGNOSIS — I10 Essential (primary) hypertension: Secondary | ICD-10-CM

## 2013-10-18 DIAGNOSIS — E0590 Thyrotoxicosis, unspecified without thyrotoxic crisis or storm: Secondary | ICD-10-CM

## 2013-10-18 LAB — TSH: TSH: 0.55 u[IU]/mL (ref 0.35–4.50)

## 2013-10-18 LAB — T4, FREE: Free T4: 1.2 ng/dL (ref 0.60–1.60)

## 2013-10-18 NOTE — Progress Notes (Signed)
Pre visit review using our clinic review tool, if applicable. No additional management support is needed unless otherwise documented below in the visit note. 

## 2013-10-18 NOTE — Patient Instructions (Signed)
Endocrinology followup as discussed Podiatry referral  Limit your sodium (Salt) intake  Return in 6 months for follow-up

## 2013-10-18 NOTE — Progress Notes (Signed)
Subjective:    Patient ID: [name], female    DOB: 03-21-1922, 80 y.o.   MRN: [id number]  HPI  80 year old patient who is seen today for her biannual followup.  She has a history of hypertension, pernicious anemia, osteoarthritis, and dyslipidemia.  She is accompanied by her daughter and seems to be very stable. She is scheduled to see endocrinology tomorrow for followup of hyperthyroidism.  She remains on low-dose Tapazole Denies any cardiopulmonary complaints She has a history of cerebrovascular disease, which has been stable  Past Medical History  Diagnosis Date  . ANEMIA, PERNICIOUS 09/10/2006  . CORONARY ARTERY DISEASE 11/07/2008    a. NSTEMI 2001 & STEMI 2008 managed medically. b. Takotsubo 06/2006. c. Cath 2009 -> med rx.  . CVA WITH LEFT HEMIPARESIS 07/22/2007    May, 2009, neurology decided aspirin and Plavix at that time.  . DERMATITIS 03/02/2007  . HYPERTENSION 07/15/2006  . MENINGIOMA 08/27/2006    Occipital meningioma, surgery, [facility], 2008 /  MRI, [place], June, 2011, no change in lesion, history believe the patient had a gamma knife treatment of a right occipital atypical meningioma  . OSTEOARTHRITIS 07/15/2006  . PULMONARY NODULE 01/14/2007    [place], December, 2011  . Takotsubo syndrome 04/22/2008    MI, May, 2008, question takotsubo event  . THYROID NODULE 01/14/2007  . IBS (irritable bowel syndrome)   . DJD (degenerative joint disease)     L shoulder impingement  . Ejection fraction     EF 60%, echo, July, 2010, (  EF improved after tach a suitable event 2008)  . Subarachnoid hemorrhage following injury 2007    Subarachnoid hemorrhage secondary to a fall December, 2007  . Bradycardia     August, 2013  . Family history of anesthesia complication     daughter has a hard time waking up  . H/O hiatal hernia   . Abnormal CT of the chest     Stable patchy/nodular and ground-glass opacities in the right upper lobe, While grossly unchanged, low grade adenocarcinoma  remains possible.  . Stroke     History   Social History  . Marital Status: Widowed    Spouse Name: N/A    Number of Children: 4  . Years of Education: N/A   Occupational History  .     Social History Main Topics  . Smoking status: Never Smoker   . Smokeless tobacco: Never Used  . Alcohol Use: No  . Drug Use: No  . Sexual Activity: No   Other Topics Concern  . Not on file   Social History Narrative   Widowed. Lives in [place], [place] with daughter.     Past Surgical History  Procedure Laterality Date  . Cholecystectomy    . Abdominal hysterectomy    . Hip surgery    . Brain tumor excision      Meningioma; treated at [facility] in 2008  . Shoulder surgery    . Joint replacement      rt THR    Family History  Problem Relation Age of Onset  . Heart attack Brother 84    Allergies  Allergen Reactions  . Atorvastatin Other (See Comments)    Raises liver enzymes (takes pravastatin at home)  . Influenza Vaccines Other (See Comments)    Unknown  . Phenytoin Other (See Comments)    Blood clots  . Promethazine Hcl Other (See Comments)    Severe sedation  . Sulfacetamide Sodium     REACTION: sulfa  .  Sulfamethoxazole     REACTION: unspecified    Current Outpatient Prescriptions on File Prior to Visit  Medication Sig Dispense Refill  . B-D SYRINGE LUER-LOK 3CC 3 ML MISC       . BD DISP NEEDLES 25G X 5/8" MISC       . clopidogrel (PLAVIX) 75 MG tablet Take 1 tablet (75 mg total) by mouth daily with breakfast.  90 tablet  5  . cyanocobalamin (,VITAMIN B-12,) 1000 MCG/ML injection Inject 1 mL (1,000 mcg total) into the muscle every 30 (thirty) days.  3 mL  3  . cyclobenzaprine (FLEXERIL) 10 MG tablet Take 1 tablet (10 mg total) by mouth 3 (three) times daily as needed for muscle spasms.  90 tablet  2  . isosorbide mononitrate (IMDUR) 30 MG 24 hr tablet Take 0.5 tablets (15 mg total) by mouth daily.  90 tablet  3  . methimazole (TAPAZOLE) 5 MG tablet Take 5mg  only on  Mon., Wed., and Fri.  90 tablet  0  . nitroGLYCERIN (NITROSTAT) 0.4 MG SL tablet Place 1 tablet (0.4 mg total) under the tongue every 5 (five) minutes as needed. For chest pain.  15 tablet  3  . pravastatin (PRAVACHOL) 20 MG tablet Take 1 tablet (20 mg total) by mouth daily. Every evening  90 tablet  5  . traMADol (ULTRAM) 50 MG tablet Take 1 tablet (50 mg total) by mouth every 6 (six) hours as needed. For pain.  90 tablet  3  . triamcinolone ointment (KENALOG) 0.1 % Apply 1 application topically 2 (two) times daily. For dry skin.  30 g  6   No current facility-administered medications on file prior to visit.    BP 120/66  Pulse 72  Temp(Src) 98.1 F (36.7 C) (Oral)  Resp 18  Ht 5\' 5"  (1.651 m)  Wt 117 lb (53.071 kg)  BMI 19.47 kg/m2  SpO2 96%     Review of Systems  Constitutional: Negative.   HENT: Negative for congestion, dental problem, hearing loss, rhinorrhea, sinus pressure, sore throat and tinnitus.   Eyes: Negative for pain, discharge and visual disturbance.  Respiratory: Negative for cough and shortness of breath.   Cardiovascular: Negative for chest pain, palpitations and leg swelling.  Gastrointestinal: Negative for nausea, vomiting, abdominal pain, diarrhea, constipation, blood in stool and abdominal distention.  Genitourinary: Negative for dysuria, urgency, frequency, hematuria, flank pain, vaginal bleeding, vaginal discharge, difficulty urinating, vaginal pain and pelvic pain.  Musculoskeletal: Negative for arthralgias, gait problem and joint swelling.  Skin: Negative for rash.       Painful corn, left foot  Neurological: Negative for dizziness, syncope, speech difficulty, weakness, numbness and headaches.  Hematological: Negative for adenopathy.  Psychiatric/Behavioral: Negative for behavioral problems, dysphoric mood and agitation. The patient is not nervous/anxious.        Objective:   Physical Exam  Constitutional: She is oriented to person, place, and time.  She appears well-developed and well-nourished.  HENT:  Head: Normocephalic.  Right Ear: External ear normal.  Left Ear: External ear normal.  Mouth/Throat: Oropharynx is clear and moist.  Eyes: Conjunctivae and EOM are normal. Pupils are equal, round, and reactive to light.  Neck: Normal range of motion. Neck supple. No thyromegaly present.  Cardiovascular: Normal rate, regular rhythm, normal heart sounds and intact distal pulses.   Pulmonary/Chest: Effort normal and breath sounds normal.  Abdominal: Soft. Bowel sounds are normal. She exhibits no mass. There is no tenderness.  Musculoskeletal: Normal range of motion.  Lymphadenopathy:    She has no cervical adenopathy.  Neurological: She is alert and oriented to person, place, and time.  Skin: Skin is warm and dry. No rash noted.  Large corn, left lateral foot  Psychiatric: She has a normal mood and affect. Her behavior is normal.          Assessment & Plan:   Hypertension well controlled Through the vascular disease, stable.  Will continue Plavix Dyslipidemia.  Continue pravastatin Hyperthyroidism.  Clinically euthyroid.  Recheck TSH and free T4.  Endocrinology follow up tomorrow Osteoarthritis

## 2013-10-19 ENCOUNTER — Encounter: Payer: Self-pay | Admitting: Endocrinology

## 2013-10-19 ENCOUNTER — Ambulatory Visit (INDEPENDENT_AMBULATORY_CARE_PROVIDER_SITE_OTHER): Payer: Medicare Other | Admitting: Endocrinology

## 2013-10-19 VITALS — BP 122/74 | HR 61 | Temp 98.3°F | Ht 65.0 in | Wt 117.0 lb

## 2013-10-19 DIAGNOSIS — E0590 Thyrotoxicosis, unspecified without thyrotoxic crisis or storm: Secondary | ICD-10-CM

## 2013-10-19 NOTE — Patient Instructions (Signed)
Please continue the same methimazole if ever you have fever while taking it, stop it and call us, because of the risk of a rare side-effect. Please come back for a follow-up appointment in 4-6 months.

## 2013-10-19 NOTE — Progress Notes (Signed)
Subjective:    Patient ID: [name], female    DOB: 1922/06/14, 80 y.o.   MRN: [id number]  HPI Pt was noted in the hospital in [place], 2013, to have hyperthyroidism.  She was started on tapazole.  Since on the tapazole, she feels no different).  She was also noted to have a thyroid nodule, but w/u was felt not to be worthwhile, due to her advanced age and health problems.  fatigue persists. Past Medical History  Diagnosis Date  . ANEMIA, PERNICIOUS 09/10/2006  . CORONARY ARTERY DISEASE 11/07/2008    a. NSTEMI 2001 & STEMI 2008 managed medically. b. Takotsubo 06/2006. c. Cath 2009 -> med rx.  . CVA WITH LEFT HEMIPARESIS 07/22/2007    May, 2009, neurology decided aspirin and Plavix at that time.  . DERMATITIS 03/02/2007  . HYPERTENSION 07/15/2006  . MENINGIOMA 08/27/2006    Occipital meningioma, surgery, [facility], 2008 /  MRI, [place], June, 2011, no change in lesion, history believe the patient had a gamma knife treatment of a right occipital atypical meningioma  . OSTEOARTHRITIS 07/15/2006  . PULMONARY NODULE 01/14/2007    [facility], December, 2011  . Takotsubo syndrome 04/22/2008    MI, May, 2008, question takotsubo event  . THYROID NODULE 01/14/2007  . IBS (irritable bowel syndrome)   . DJD (degenerative joint disease)     L shoulder impingement  . Ejection fraction     EF 60%, echo, July, 2010, (  EF improved after tach a suitable event 2008)  . Subarachnoid hemorrhage following injury 2007    Subarachnoid hemorrhage secondary to a fall December, 2007  . Bradycardia     August, 2013  . Family history of anesthesia complication     daughter has a hard time waking up  . H/O hiatal hernia   . Abnormal CT of the chest     Stable patchy/nodular and ground-glass opacities in the right upper lobe, While grossly unchanged, low grade adenocarcinoma remains possible.  . Stroke     Past Surgical History  Procedure Laterality Date  . Cholecystectomy    . Abdominal hysterectomy    . Hip  surgery    . Brain tumor excision      Meningioma; treated at [facility] in 2008  . Shoulder surgery    . Joint replacement      rt THR    History   Social History  . Marital Status: Widowed    Spouse Name: N/A    Number of Children: 4  . Years of Education: N/A   Occupational History  .     Social History Main Topics  . Smoking status: Never Smoker   . Smokeless tobacco: Never Used  . Alcohol Use: No  . Drug Use: No  . Sexual Activity: No   Other Topics Concern  . Not on file   Social History Narrative   Widowed. Lives in [place], [place] with daughter.     Current Outpatient Prescriptions on File Prior to Visit  Medication Sig Dispense Refill  . B-D SYRINGE LUER-LOK 3CC 3 ML MISC       . BD DISP NEEDLES 25G X 5/8" MISC       . clopidogrel (PLAVIX) 75 MG tablet Take 1 tablet (75 mg total) by mouth daily with breakfast.  90 tablet  5  . cyanocobalamin (,VITAMIN B-12,) 1000 MCG/ML injection Inject 1 mL (1,000 mcg total) into the muscle every 30 (thirty) days.  3 mL  3  . cyclobenzaprine (FLEXERIL) 10  MG tablet Take 1 tablet (10 mg total) by mouth 3 (three) times daily as needed for muscle spasms.  90 tablet  2  . isosorbide mononitrate (IMDUR) 30 MG 24 hr tablet Take 0.5 tablets (15 mg total) by mouth daily.  90 tablet  3  . methimazole (TAPAZOLE) 5 MG tablet Take 5mg  only on Mon., Wed., and Fri.  90 tablet  0  . nitroGLYCERIN (NITROSTAT) 0.4 MG SL tablet Place 1 tablet (0.4 mg total) under the tongue every 5 (five) minutes as needed. For chest pain.  15 tablet  3  . pravastatin (PRAVACHOL) 20 MG tablet Take 1 tablet (20 mg total) by mouth daily. Every evening  90 tablet  5  . PREMARIN vaginal cream Place 0.5 g vaginally as needed.       . traMADol (ULTRAM) 50 MG tablet Take 1 tablet (50 mg total) by mouth every 6 (six) hours as needed. For pain.  90 tablet  3  . triamcinolone ointment (KENALOG) 0.1 % Apply 1 application topically 2 (two) times daily. For dry skin.  30 g  6    No current facility-administered medications on file prior to visit.    Allergies  Allergen Reactions  . Atorvastatin Other (See Comments)    Raises liver enzymes (takes pravastatin at home)  . Influenza Vaccines Other (See Comments)    Unknown  . Phenytoin Other (See Comments)    Blood clots  . Promethazine Hcl Other (See Comments)    Severe sedation  . Sulfacetamide Sodium     REACTION: sulfa  . Sulfamethoxazole     REACTION: unspecified    Family History  Problem Relation Age of Onset  . Heart attack Brother 84    BP 122/74  Pulse 61  Temp(Src) 98.3 F (36.8 C) (Oral)  Ht 5\' 5"  (1.651 m)  Wt 117 lb (53.071 kg)  BMI 19.47 kg/m2  SpO2 96%    Review of Systems Denies fever    Objective:   Physical Exam Vital signs: see vs page Gen: elderly, frail, no distress Skin: not diaphoretic Neuro: slight fine tremor of the hands   Lab Results  Component Value Date   TSH 0.55 10/18/2013      Assessment & Plan:  Hyperthyroidism, well-controlled please consider these measures for your health:  minimize alcohol.  do not use tobacco products.  have a colonoscopy at least every 10 years from age 63.  Women should have an annual mammogram from age 6.  keep firearms safely stored.  always use seat belts.  have working smoke alarms in your home.  see an eye doctor and dentist regularly.  never drive under the influence of alcohol or drugs (including prescription drugs).  those with fair skin should take precautions against the sun. Patient Instructions  Please continue the same methimazole if ever you have fever while taking it, stop it and call us, because of the risk of a rare side-effect. Please come back for a follow-up appointment in 4-6 months.

## 2014-01-04 ENCOUNTER — Other Ambulatory Visit: Payer: Self-pay | Admitting: Internal Medicine

## 2014-01-05 ENCOUNTER — Other Ambulatory Visit: Payer: Self-pay | Admitting: Internal Medicine

## 2014-02-11 ENCOUNTER — Encounter: Payer: Self-pay | Admitting: Cardiology

## 2014-02-11 ENCOUNTER — Ambulatory Visit (INDEPENDENT_AMBULATORY_CARE_PROVIDER_SITE_OTHER): Payer: Medicare Other | Admitting: Cardiology

## 2014-02-11 VITALS — BP 128/80 | HR 65 | Ht 65.0 in | Wt 121.8 lb

## 2014-02-11 DIAGNOSIS — R079 Chest pain, unspecified: Secondary | ICD-10-CM

## 2014-02-11 DIAGNOSIS — I10 Essential (primary) hypertension: Secondary | ICD-10-CM

## 2014-02-11 NOTE — Progress Notes (Signed)
Patient ID: [name], female   DOB: 07/13/1922, 81 y.o.   MRN: [id number]    HPI Patient is seen to follow-up her overall cardiac status. In the past there was question of a probable Takotsubo event. Over time her LV function normalized. I saw her last January, 2015. She was hospitalized in July, 2015. She had some chest pain. There was no MI. 2-D was normal and the nuclear study was normal. She is not having any significant recurrent problems.  Allergies  Allergen Reactions  . Atorvastatin Other (See Comments)    Raises liver enzymes (takes pravastatin at home)  . Influenza Vaccines Other (See Comments)    Unknown  . Phenytoin Other (See Comments)    Blood clots  . Promethazine Hcl Other (See Comments)    Severe sedation  . Sulfacetamide Sodium     REACTION: sulfa  Per pt daughter she stated this causes a rash  . Sulfamethoxazole     REACTION: unspecified  Per pt daughter she stated this causes rash    Current Outpatient Prescriptions  Medication Sig Dispense Refill  . B-D SYRINGE LUER-LOK 3CC 3 ML MISC     . BD DISP NEEDLES 25G X 5/8" MISC USE AS DIRECTED ONCE A MONTH FOR B12 INJECTIONS 3 each 3  . clopidogrel (PLAVIX) 75 MG tablet Take 1 tablet (75 mg total) by mouth daily with breakfast. 90 tablet 5  . cyanocobalamin (,VITAMIN B-12,) 1000 MCG/ML injection INJECT 1 ML (1000 MCG TOTAL) INTO THE MUSCLE  EVERY 30 DAYS 3 mL 1  . cyclobenzaprine (FLEXERIL) 10 MG tablet TAKE 1 TABLET THREE TIMES A DAY AS NEEDED FOR MUSCLE SPASMS 90 tablet 1  . isosorbide mononitrate (IMDUR) 30 MG 24 hr tablet Take 0.5 tablets (15 mg total) by mouth daily. 90 tablet 3  . methimazole (TAPAZOLE) 5 MG tablet TAKE 1 TABLET ON MONDAY, WEDNESDAY, AND FRIDAY 90 tablet 1  . nitroGLYCERIN (NITROSTAT) 0.4 MG SL tablet Place 1 tablet (0.4 mg total) under the tongue every 5 (five) minutes as needed. For chest pain. 15 tablet 3  . pravastatin (PRAVACHOL) 20 MG tablet Take 1 tablet (20 mg total) by mouth  daily. Every evening 90 tablet 5  . PREMARIN vaginal cream Place 0.5 g vaginally as needed.     . traMADol (ULTRAM) 50 MG tablet Take 1 tablet (50 mg total) by mouth every 6 (six) hours as needed. For pain. 90 tablet 3  . triamcinolone ointment (KENALOG) 0.1 % Apply 1 application topically 2 (two) times daily. For dry skin. 30 g 6   No current facility-administered medications for this visit.    History   Social History  . Marital Status: Widowed    Spouse Name: N/A    Number of Children: 4  . Years of Education: N/A   Occupational History  .     Social History Main Topics  . Smoking status: Never Smoker   . Smokeless tobacco: Never Used  . Alcohol Use: No  . Drug Use: No  . Sexual Activity: No   Other Topics Concern  . Not on file   Social History Narrative   Widowed. Lives in [place], [place] with daughter.     Family History  Problem Relation Age of Onset  . Heart attack Brother 7    Past Medical History  Diagnosis Date  . ANEMIA, PERNICIOUS 09/10/2006  . CORONARY ARTERY DISEASE 11/07/2008    a. NSTEMI 2001 & STEMI 2008 managed medically. b. Takotsubo 06/2006. c. Cath  2009 -> med rx.  . CVA WITH LEFT HEMIPARESIS 07/22/2007    May, 2009, neurology decided aspirin and Plavix at that time.  . DERMATITIS 03/02/2007  . HYPERTENSION 07/15/2006  . MENINGIOMA 08/27/2006    Occipital meningioma, surgery, [facility], 2008 /  MRI, [place], June, 2011, no change in lesion, history believe the patient had a gamma knife treatment of a right occipital atypical meningioma  . OSTEOARTHRITIS 07/15/2006  . PULMONARY NODULE 01/14/2007    [facility], December, 2011  . Takotsubo syndrome 04/22/2008    MI, May, 2008, question takotsubo event  . THYROID NODULE 01/14/2007  . IBS (irritable bowel syndrome)   . DJD (degenerative joint disease)     L shoulder impingement  . Ejection fraction     EF 60%, echo, July, 2010, (  EF improved after tach a suitable event 2008)  . Subarachnoid hemorrhage  following injury 2007    Subarachnoid hemorrhage secondary to a fall December, 2007  . Bradycardia     August, 2013  . Family history of anesthesia complication     daughter has a hard time waking up  . H/O hiatal hernia   . Abnormal CT of the chest     Stable patchy/nodular and ground-glass opacities in the right upper lobe, While grossly unchanged, low grade adenocarcinoma remains possible.  . Stroke     Past Surgical History  Procedure Laterality Date  . Cholecystectomy    . Abdominal hysterectomy    . Hip surgery    . Brain tumor excision      Meningioma; [facility] at [facility] in 2008  . Shoulder surgery    . Joint replacement      rt THR    Patient Active Problem List   Diagnosis Date Noted  . Chest pain 08/13/2013  . Unstable angina pectoris 01/30/2013  . Shortness of breath 01/30/2013  . Chest pain at rest 04/29/2012  . GERD (gastroesophageal reflux disease) 11/19/2011  . Dysphagia 11/18/2011  . Dyspnea 11/17/2011  . Palpitations 11/17/2011  . Encounter for long-term (current) use of other medications 10/29/2011  . Hyperthyroidism 10/14/2011  . Vertigo 10/14/2011  . Lightheadedness 10/14/2011  . UTI (lower urinary tract infection) 10/14/2011  . Bradycardia   . Ejection fraction   . Subarachnoid hemorrhage following injury   . Hyperlipidemia 03/05/2011  . Takotsubo syndrome 04/22/2008  . CVA WITH LEFT HEMIPARESIS 07/22/2007  . DERMATITIS 03/02/2007  . THYROID NODULE 01/14/2007  . PULMONARY NODULE 01/14/2007  . ANEMIA, PERNICIOUS 09/10/2006  . MENINGIOMA 08/27/2006  . HYPERTENSION 07/15/2006  . OSTEOARTHRITIS 07/15/2006    ROS  Patient is here with her daughter. She denies fever, chills, headache, sweats, rash, change in vision, change in hearing, chest pain, cough, nausea or vomiting, urinary symptoms. She does have a skin rash on her lateral left arm and her left neck. All other systems are reviewed and are negative.  PHYSICAL EXAM Patient is here with  her daughter. She is frail but oriented to person time and place. Affect is normal. Head is atraumatic. Sclerae and conjunctivae is normal. There is a skin rash on her left neck and left arm. There is no jugular venous distention. Lungs are clear. Respiratory effort is nonlabored. Cardiac exam reveals S1 and S2. Abdomen is soft. There is no peripheral edema. Filed Vitals:   02/11/14 1434  BP: 128/80  Pulse: 65  Height: 5\' 5"  (1.651 m)  Weight: 121 lb 12.8 oz (55.248 kg)  SpO2: 97%     ASSESSMENT &  PLAN

## 2014-02-11 NOTE — Patient Instructions (Signed)
**Note De-identified  Obfuscation** Your physician recommends that you continue on your current medications as directed. Please refer to the Current Medication list given to you today.  Your physician wants you to follow-up in: 1 year. You will receive a reminder letter in the mail two months in advance. If you don't receive a letter, please call our office to schedule the follow-up appointment.  

## 2014-02-11 NOTE — Assessment & Plan Note (Signed)
Blood pressure is controlled. No change in therapy. 

## 2014-02-11 NOTE — Assessment & Plan Note (Signed)
Patient had a nuclear scan in July, 2015. There was no ischemia. She is stable. No further workup at this time.

## 2014-04-06 ENCOUNTER — Other Ambulatory Visit: Payer: Self-pay | Admitting: Internal Medicine

## 2014-04-18 ENCOUNTER — Other Ambulatory Visit: Payer: Self-pay | Admitting: *Deleted

## 2014-04-18 ENCOUNTER — Encounter: Payer: Self-pay | Admitting: Internal Medicine

## 2014-04-18 ENCOUNTER — Ambulatory Visit (INDEPENDENT_AMBULATORY_CARE_PROVIDER_SITE_OTHER): Payer: Medicare Other | Admitting: Internal Medicine

## 2014-04-18 DIAGNOSIS — I10 Essential (primary) hypertension: Secondary | ICD-10-CM | POA: Diagnosis not present

## 2014-04-18 DIAGNOSIS — Z23 Encounter for immunization: Secondary | ICD-10-CM | POA: Diagnosis not present

## 2014-04-18 DIAGNOSIS — E0590 Thyrotoxicosis, unspecified without thyrotoxic crisis or storm: Secondary | ICD-10-CM | POA: Diagnosis not present

## 2014-04-18 DIAGNOSIS — E785 Hyperlipidemia, unspecified: Secondary | ICD-10-CM

## 2014-04-18 DIAGNOSIS — E041 Nontoxic single thyroid nodule: Secondary | ICD-10-CM | POA: Diagnosis not present

## 2014-04-18 LAB — TSH: TSH: 1.8 u[IU]/mL (ref 0.35–4.50)

## 2014-04-18 LAB — T4, FREE: FREE T4: 1 ng/dL (ref 0.60–1.60)

## 2014-04-18 MED ORDER — PRAVASTATIN SODIUM 20 MG PO TABS: 20.0000 mg | ORAL_TABLET | Freq: Every day | ORAL | Status: DC

## 2014-04-18 MED ORDER — CLOPIDOGREL BISULFATE 75 MG PO TABS: 75.0000 mg | ORAL_TABLET | Freq: Every day | ORAL | Status: DC

## 2014-04-18 MED ORDER — TRAMADOL HCL 50 MG PO TABS: 50.0000 mg | ORAL_TABLET | Freq: Four times a day (QID) | ORAL | Status: DC | PRN

## 2014-04-18 MED ORDER — CYANOCOBALAMIN 1000 MCG/ML IJ SOLN: INTRAMUSCULAR | Status: DC

## 2014-04-18 MED ORDER — CYCLOBENZAPRINE HCL 10 MG PO TABS: ORAL_TABLET | ORAL | Status: DC

## 2014-04-18 MED ORDER — TRIAMCINOLONE ACETONIDE 0.1 % EX OINT: TOPICAL_OINTMENT | CUTANEOUS | Status: DC

## 2014-04-18 MED ORDER — ISOSORBIDE MONONITRATE ER 30 MG PO TB24: 15.0000 mg | ORAL_TABLET | Freq: Every day | ORAL | Status: DC

## 2014-04-18 NOTE — Progress Notes (Signed)
Subjective:    Patient ID: [name], female    DOB: 16-Jun-1922, 81 y.o.   MRN: [id number]  HPI 81 year old patient who is seen today for her six-month follow-up.  She has a history of pernicious anemia and hyperthyroidism.  She remains on Tapazole 2 times per week, which she states she tolerates poorly.  She has nausea, weakness, each day that she takes this medication. She has hypertension which has been well controlled.  She has cerebrovascular disease which has been stable.  Denies any focal neurological symptoms.  She has a history of dyslipidemia and remains on statin therapy.  She has coronary artery disease which has been stable.  She remains on Plavix.  No exertional chest pain.  Past Medical History  Diagnosis Date  . ANEMIA, PERNICIOUS 09/10/2006  . CORONARY ARTERY DISEASE 11/07/2008    a. NSTEMI 2001 & STEMI 2008 managed medically. b. Takotsubo 06/2006. c. Cath 2009 -> med rx.  . CVA WITH LEFT HEMIPARESIS 07/22/2007    May, 2009, neurology decided aspirin and Plavix at that time.  . DERMATITIS 03/02/2007  . HYPERTENSION 07/15/2006  . MENINGIOMA 08/27/2006    Occipital meningioma, surgery, [facility], 2008 /  MRI, [place], June, 2011, no change in lesion, history believe the patient had a gamma knife treatment of a right occipital atypical meningioma  . OSTEOARTHRITIS 07/15/2006  . PULMONARY NODULE 01/14/2007    [facility], December, 2011  . Takotsubo syndrome 04/22/2008    MI, May, 2008, question takotsubo event  . THYROID NODULE 01/14/2007  . IBS (irritable bowel syndrome)   . DJD (degenerative joint disease)     L shoulder impingement  . Ejection fraction     EF 60%, echo, July, 2010, (  EF improved after tach a suitable event 2008)  . Subarachnoid hemorrhage following injury 2007    Subarachnoid hemorrhage secondary to a fall December, 2007  . Bradycardia     August, 2013  . Family history of anesthesia complication     daughter has a hard time waking up  . H/O hiatal hernia    . Abnormal CT of the chest     Stable patchy/nodular and ground-glass opacities in the right upper lobe, While grossly unchanged, low grade adenocarcinoma remains possible.  . Stroke     History   Social History  . Marital Status: Widowed    Spouse Name: N/A  . Number of Children: 4  . Years of Education: N/A   Occupational History  .     Social History Main Topics  . Smoking status: Never Smoker   . Smokeless tobacco: Never Used  . Alcohol Use: No  . Drug Use: No  . Sexual Activity: No   Other Topics Concern  . Not on file   Social History Narrative   Widowed. Lives in [place], [place] with daughter.     Past Surgical History  Procedure Laterality Date  . Cholecystectomy    . Abdominal hysterectomy    . Hip surgery    . Brain tumor excision      Meningioma; [facility] at [facility] in 2008  . Shoulder surgery    . Joint replacement      rt THR    Family History  Problem Relation Age of Onset  . Heart attack Brother 84    Allergies  Allergen Reactions  . Atorvastatin Other (See Comments)    Raises liver enzymes (takes pravastatin at home)  . Influenza Vaccines Other (See Comments)    Unknown  .  Phenytoin Other (See Comments)    Blood clots  . Promethazine Hcl Other (See Comments)    Severe sedation  . Sulfacetamide Sodium     REACTION: sulfa  Per pt daughter she stated this causes a rash  . Sulfamethoxazole     REACTION: unspecified  Per pt daughter she stated this causes rash    Current Outpatient Prescriptions on File Prior to Visit  Medication Sig Dispense Refill  . B-D SYRINGE LUER-LOK 3CC 3 ML MISC USE AS DIRECTED ONCE A MONTH FOR B12 INJECTIONS 3 each 5  . BD DISP NEEDLES 25G X 5/8" MISC USE AS DIRECTED ONCE A MONTH FOR B12 INJECTIONS 3 each 3  . clopidogrel (PLAVIX) 75 MG tablet Take 1 tablet (75 mg total) by mouth daily with breakfast. 90 tablet 5  . cyanocobalamin (,VITAMIN B-12,) 1000 MCG/ML injection INJECT 1 ML (1000 MCG TOTAL) INTO THE  MUSCLE  EVERY 30 DAYS 3 mL 1  . cyclobenzaprine (FLEXERIL) 10 MG tablet TAKE 1 TABLET THREE TIMES A DAY AS NEEDED FOR MUSCLE SPASMS 90 tablet 1  . isosorbide mononitrate (IMDUR) 30 MG 24 hr tablet Take 0.5 tablets (15 mg total) by mouth daily. 90 tablet 3  . methimazole (TAPAZOLE) 5 MG tablet TAKE 1 TABLET ON MONDAY, WEDNESDAY, AND FRIDAY 90 tablet 1  . nitroGLYCERIN (NITROSTAT) 0.4 MG SL tablet Place 1 tablet (0.4 mg total) under the tongue every 5 (five) minutes as needed. For chest pain. 15 tablet 3  . pravastatin (PRAVACHOL) 20 MG tablet Take 1 tablet (20 mg total) by mouth daily. Every evening 90 tablet 5  . PREMARIN vaginal cream Place 0.5 g vaginally as needed.     . traMADol (ULTRAM) 50 MG tablet Take 1 tablet (50 mg total) by mouth every 6 (six) hours as needed. For pain. 90 tablet 3  . triamcinolone ointment (KENALOG) 0.1 % APPLY 1 APPLICATION TOPICALLY TWICE A DAY FOR DRY SKIN 2 g 5   No current facility-administered medications on file prior to visit.    BP 130/80 mmHg  Pulse 71  Temp(Src) 97.8 F (36.6 C) (Oral)  Resp 18  Ht 5\' 5"  (1.651 m)  Wt 123 lb (55.792 kg)  BMI 20.47 kg/m2  SpO2 97%      Review of Systems  Constitutional: Negative.   HENT: Negative for congestion, dental problem, hearing loss, rhinorrhea, sinus pressure, sore throat and tinnitus.   Eyes: Negative for pain, discharge and visual disturbance.  Respiratory: Negative for cough and shortness of breath.   Cardiovascular: Negative for chest pain, palpitations and leg swelling.  Gastrointestinal: Negative for nausea, vomiting, abdominal pain, diarrhea, constipation, blood in stool and abdominal distention.  Genitourinary: Negative for dysuria, urgency, frequency, hematuria, flank pain, vaginal bleeding, vaginal discharge, difficulty urinating, vaginal pain and pelvic pain.  Musculoskeletal: Negative for joint swelling, arthralgias and gait problem.  Skin: Negative for rash.  Neurological: Negative for  dizziness, syncope, speech difficulty, weakness, numbness and headaches.  Hematological: Negative for adenopathy.  Psychiatric/Behavioral: Negative for behavioral problems, dysphoric mood and agitation. The patient is not nervous/anxious.        Objective:   Physical Exam  Constitutional: She is oriented to person, place, and time. She appears well-developed and well-nourished.  HENT:  Head: Normocephalic.  Right Ear: External ear normal.  Left Ear: External ear normal.  Mouth/Throat: Oropharynx is clear and moist.  Eyes: Conjunctivae and EOM are normal. Pupils are equal, round, and reactive to light.  Neck: Normal range of motion. Neck  supple. No thyromegaly present.  Cardiovascular: Normal rate, regular rhythm, normal heart sounds and intact distal pulses.   Pulmonary/Chest: Effort normal and breath sounds normal.  Abdominal: Soft. Bowel sounds are normal. She exhibits no mass. There is no tenderness.  Musculoskeletal: Normal range of motion.  Lymphadenopathy:    She has no cervical adenopathy.  Neurological: She is alert and oriented to person, place, and time.  Skin: Skin is warm and dry. No rash noted.  Psychiatric: She has a normal mood and affect. Her behavior is normal.          Assessment & Plan:   History of hyperthyroidism.  Will check a free T4 and TSH.  At this is normal.  Will discontinue Tapazole.  Recheck thyroid function studies in 6 weeks Hypertension, stable Dyslipidemia.  Continue statin therapy Cerebrovascular disease, stable Coronary artery disease asymptomatic  Recheck 6 months

## 2014-04-18 NOTE — Patient Instructions (Signed)
Return in 2 months for follow-up thyroid function studies  Please check your blood pressure on a regular basis.  If it is consistently greater than 150/90, please make an office appointment.  Return in 6 months for follow-up

## 2014-04-19 ENCOUNTER — Ambulatory Visit (INDEPENDENT_AMBULATORY_CARE_PROVIDER_SITE_OTHER): Payer: Medicare Other | Admitting: Endocrinology

## 2014-04-19 ENCOUNTER — Encounter: Payer: Self-pay | Admitting: Endocrinology

## 2014-04-19 VITALS — BP 114/64 | HR 76 | Temp 97.9°F | Ht 65.0 in | Wt 123.0 lb

## 2014-04-19 DIAGNOSIS — E0590 Thyrotoxicosis, unspecified without thyrotoxic crisis or storm: Secondary | ICD-10-CM

## 2014-04-19 MED ORDER — METHIMAZOLE 5 MG PO TABS: ORAL_TABLET | ORAL | Status: DC

## 2014-04-19 NOTE — Progress Notes (Signed)
Subjective:    Patient ID: [name], female    DOB: [birth year], 81 y.o.   MRN: [id number]  HPI Pt returns for f/u of hyperthyroidism (dx'ed 2013, to have hyperthyroidism; she was started on tapazole; she was also noted to have a thyroid nodule, but w/u was felt not to be worthwhile, due to her advanced age and health problems).  She reports moderate weakness throughout the body, but no assoc numbness). She says this only happens on the days when she takes tapazole.  Past Medical History  Diagnosis Date  . ANEMIA, PERNICIOUS 09/10/2006  . CORONARY ARTERY DISEASE 11/07/2008    a. NSTEMI 2001 & STEMI 2008 managed medically. b. Takotsubo 06/2006. c. Cath 2009 -> med rx.  . CVA WITH LEFT HEMIPARESIS 07/22/2007    May, 2009, neurology decided aspirin and Plavix at that time.  . DERMATITIS 03/02/2007  . HYPERTENSION 07/15/2006  . MENINGIOMA 08/27/2006    Occipital meningioma, surgery, [facility], 2008 /  MRI, [facility], June, 2011, no change in lesion, history believe the patient had a gamma knife treatment of a right occipital atypical meningioma  . OSTEOARTHRITIS 07/15/2006  . PULMONARY NODULE 01/14/2007    [facility], December, 2011  . Takotsubo syndrome 04/22/2008    MI, May, 2008, question takotsubo event  . THYROID NODULE 01/14/2007  . IBS (irritable bowel syndrome)   . DJD (degenerative joint disease)     L shoulder impingement  . Ejection fraction     EF 60%, echo, July, 2010, (  EF improved after tach a suitable event 2008)  . Subarachnoid hemorrhage following injury 2007    Subarachnoid hemorrhage secondary to a fall December, 2007  . Bradycardia     August, 2013  . Family history of anesthesia complication     daughter has a hard time waking up  . H/O hiatal hernia   . Abnormal CT of the chest     Stable patchy/nodular and ground-glass opacities in the right upper lobe, While grossly unchanged, low grade adenocarcinoma remains possible.  . Stroke     Past Surgical History  Procedure  Laterality Date  . Cholecystectomy    . Abdominal hysterectomy    . Hip surgery    . Brain tumor excision      Meningioma; treated at [facility] in 2008  . Shoulder surgery    . Joint replacement      rt THR    History   Social History  . Marital Status: Widowed    Spouse Name: N/A  . Number of Children: 4  . Years of Education: N/A   Occupational History  .     Social History Main Topics  . Smoking status: Never Smoker   . Smokeless tobacco: Never Used  . Alcohol Use: No  . Drug Use: No  . Sexual Activity: No   Other Topics Concern  . Not on file   Social History Narrative   Widowed. Lives in [place], [place] with daughter.     Current Outpatient Prescriptions on File Prior to Visit  Medication Sig Dispense Refill  . B-D SYRINGE LUER-LOK 3CC 3 ML MISC USE AS DIRECTED ONCE A MONTH FOR B12 INJECTIONS 3 each 5  . BD DISP NEEDLES 25G X 5/8" MISC USE AS DIRECTED ONCE A MONTH FOR B12 INJECTIONS 3 each 3  . clopidogrel (PLAVIX) 75 MG tablet Take 1 tablet (75 mg total) by mouth daily with breakfast. 90 tablet 1  . cyanocobalamin (,VITAMIN B-12,) 1000 MCG/ML injection INJECT 1  ML (1000 MCG TOTAL) INTO THE MUSCLE  EVERY 30 DAYS 3 mL 3  . cyclobenzaprine (FLEXERIL) 10 MG tablet TAKE 1 TABLET THREE TIMES A DAY AS NEEDED FOR MUSCLE SPASMS 270 tablet 1  . isosorbide mononitrate (IMDUR) 30 MG 24 hr tablet Take 0.5 tablets (15 mg total) by mouth daily. 90 tablet 1  . nitroGLYCERIN (NITROSTAT) 0.4 MG SL tablet Place 1 tablet (0.4 mg total) under the tongue every 5 (five) minutes as needed. For chest pain. 15 tablet 3  . pravastatin (PRAVACHOL) 20 MG tablet Take 1 tablet (20 mg total) by mouth daily. Every evening 90 tablet 1  . PREMARIN vaginal cream Place 0.5 g vaginally as needed.     . traMADol (ULTRAM) 50 MG tablet Take 1 tablet (50 mg total) by mouth every 6 (six) hours as needed. For pain. 90 tablet 1  . triamcinolone ointment (KENALOG) 0.1 % APPLY 1 APPLICATION TOPICALLY TWICE A DAY  FOR DRY SKIN 80 g 1   No current facility-administered medications on file prior to visit.    Allergies  Allergen Reactions  . Atorvastatin Other (See Comments)    Raises liver enzymes (takes pravastatin at home)  . Influenza Vaccines Other (See Comments)    Unknown  . Phenytoin Other (See Comments)    Blood clots  . Promethazine Hcl Other (See Comments)    Severe sedation  . Sulfacetamide Sodium     REACTION: sulfa  Per pt daughter she stated this causes a rash  . Sulfamethoxazole     REACTION: unspecified  Per pt daughter she stated this causes rash    Family History  Problem Relation Age of Onset  . Heart attack Brother 84    BP 114/64 mmHg  Pulse 76  Temp(Src) 97.9 F (36.6 C) (Oral)  Ht 5\' 5"  (1.651 m)  Wt 123 lb (55.792 kg)  BMI 20.47 kg/m2  SpO2 97%    Review of Systems Denies fever and LOC    Objective:   Physical Exam Vital signs: see vs page Gen: elderly, frail, no distress NECK: There is no palpable thyroid enlargement.  No thyroid nodule is palpable.  No palpable lymphadenopathy at the anterior neck. Skin: not diaphoretic Neuro: slight tremor of the hands.    Lab Results  Component Value Date   TSH 1.80 04/18/2014      Assessment & Plan:  Hyperthyroidism, well-controlled Tremor. Mild  Not thyroid-related.  No rx needed.  Weakness, new, perceived due to tapazole.   Patient is advised the following: Patient Instructions  Please change the methimazole to 1/2 pill every day.  i have sent a new prescription to your pharmacy if ever you have fever while taking it, stop it and call us, because of the risk of a rare side-effect. Please come back for a follow-up appointment in 4 months.

## 2014-04-19 NOTE — Patient Instructions (Addendum)
Please change the methimazole to 1/2 pill every day.  i have sent a new prescription to your pharmacy if ever you have fever while taking it, stop it and call us, because of the risk of a rare side-effect. Please come back for a follow-up appointment in 4 months.

## 2014-06-20 ENCOUNTER — Other Ambulatory Visit (INDEPENDENT_AMBULATORY_CARE_PROVIDER_SITE_OTHER): Payer: Medicare Other

## 2014-06-20 DIAGNOSIS — E0590 Thyrotoxicosis, unspecified without thyrotoxic crisis or storm: Secondary | ICD-10-CM | POA: Diagnosis not present

## 2014-06-20 LAB — TSH: TSH: 0.71 u[IU]/mL (ref 0.35–4.50)

## 2014-06-20 LAB — T4, FREE: Free T4: 0.95 ng/dL (ref 0.60–1.60)

## 2014-08-16 ENCOUNTER — Ambulatory Visit (INDEPENDENT_AMBULATORY_CARE_PROVIDER_SITE_OTHER): Payer: Medicare Other | Admitting: Endocrinology

## 2014-08-16 ENCOUNTER — Encounter: Payer: Self-pay | Admitting: Endocrinology

## 2014-08-16 VITALS — BP 118/78 | HR 67 | Temp 97.7°F | Resp 15 | Ht 64.0 in | Wt 120.0 lb

## 2014-08-16 DIAGNOSIS — D510 Vitamin B12 deficiency anemia due to intrinsic factor deficiency: Secondary | ICD-10-CM | POA: Diagnosis not present

## 2014-08-16 DIAGNOSIS — M6281 Muscle weakness (generalized): Secondary | ICD-10-CM

## 2014-08-16 DIAGNOSIS — Z0000 Encounter for general adult medical examination without abnormal findings: Secondary | ICD-10-CM | POA: Insufficient documentation

## 2014-08-16 DIAGNOSIS — E0590 Thyrotoxicosis, unspecified without thyrotoxic crisis or storm: Secondary | ICD-10-CM

## 2014-08-16 LAB — BASIC METABOLIC PANEL
BUN: 18 mg/dL (ref 6–23)
CALCIUM: 9.5 mg/dL (ref 8.4–10.5)
CHLORIDE: 104 meq/L (ref 96–112)
CO2: 29 meq/L (ref 19–32)
CREATININE: 0.93 mg/dL (ref 0.40–1.20)
GFR: 60.13 mL/min (ref >60.00)
Glucose, Bld: 107 mg/dL — ABNORMAL HIGH (ref 70–99)
Potassium: 3.9 mEq/L (ref 3.5–5.1)
SODIUM: 140 meq/L (ref 135–145)

## 2014-08-16 LAB — CBC WITH DIFFERENTIAL/PLATELET
BASOS PCT: 0.5 % (ref 0.0–3.0)
Basophils Absolute: 0 10*3/uL (ref 0.0–0.1)
EOS PCT: 3 % (ref 0.0–5.0)
Eosinophils Absolute: 0.2 10*3/uL (ref 0.0–0.7)
HEMATOCRIT: 36.4 % (ref 36.0–46.0)
Hemoglobin: 11.9 g/dL — ABNORMAL LOW (ref 12.0–15.0)
LYMPHS ABS: 1.3 10*3/uL (ref 0.7–4.0)
Lymphocytes Relative: 18.1 % (ref 12.0–46.0)
MCHC: 32.7 g/dL (ref 30.0–36.0)
MCV: 88.6 fl (ref 78.0–100.0)
MONOS PCT: 11 % (ref 3.0–12.0)
Monocytes Absolute: 0.8 10*3/uL (ref 0.1–1.0)
Neutro Abs: 4.9 10*3/uL (ref 1.4–7.7)
Neutrophils Relative %: 67.4 % (ref 43.0–77.0)
Platelets: 249 10*3/uL (ref 150.0–400.0)
RBC: 4.11 Mil/uL (ref 3.87–5.11)
RDW: 15.7 % — ABNORMAL HIGH (ref 11.5–15.5)
WBC: 7.2 10*3/uL (ref 4.0–10.5)

## 2014-08-16 LAB — IBC PANEL
IRON: 80 ug/dL (ref 42–145)
Saturation Ratios: 16.2 % — ABNORMAL LOW (ref 20.0–50.0)
Transferrin: 352 mg/dL (ref 212.0–360.0)

## 2014-08-16 LAB — TSH: TSH: 0.8 u[IU]/mL (ref 0.35–4.50)

## 2014-08-16 NOTE — Patient Instructions (Addendum)
blood tests are requested for you today.  We'll let you know about the results. if ever you have fever while taking methimazole, stop it and call us, because of the risk of a rare side-effect Because of your weakness, I'm also requesting the annual blood tests that Dr Burnice Logan would do in September. Please come back for a follow-up appointment in 4 months.

## 2014-08-16 NOTE — Progress Notes (Signed)
Subjective:    Patient ID: [name], female    DOB: 1923/01/12, 81 y.o.   MRN: [id number]  HPI Pt returns for f/u of hyperthyroidism (dx'ed 2013, to have hyperthyroidism; she was started on tapazole; she was also noted to have a thyroid nodule, but w/u was felt not to be worthwhile, due to her advanced age and health problems).  She reports persistent weakness on the legs, but no assoc numbness.  dtr says weakness is slowly worsening.   Past Medical History  Diagnosis Date  . ANEMIA, PERNICIOUS 09/10/2006  . CORONARY ARTERY DISEASE 11/07/2008    a. NSTEMI 2001 & STEMI 2008 managed medically. b. Takotsubo 06/2006. c. Cath 2009 -> med rx.  . CVA WITH LEFT HEMIPARESIS 07/22/2007    May, 2009, neurology decided aspirin and Plavix at that time.  . DERMATITIS 03/02/2007  . HYPERTENSION 07/15/2006  . MENINGIOMA 08/27/2006    Occipital meningioma, surgery, [facility], 2008 /  MRI, [facility], June, 2011, no change in lesion, history believe the patient had a gamma knife treatment of a right occipital atypical meningioma  . OSTEOARTHRITIS 07/15/2006  . PULMONARY NODULE 01/14/2007    [facility], December, 2011  . Takotsubo syndrome 04/22/2008    MI, May, 2008, question takotsubo event  . THYROID NODULE 01/14/2007  . IBS (irritable bowel syndrome)   . DJD (degenerative joint disease)     L shoulder impingement  . Ejection fraction     EF 60%, echo, July, 2010, (  EF improved after tach a suitable event 2008)  . Subarachnoid hemorrhage following injury 2007    Subarachnoid hemorrhage secondary to a fall December, 2007  . Bradycardia     August, 2013  . Family history of anesthesia complication     daughter has a hard time waking up  . H/O hiatal hernia   . Abnormal CT of the chest     Stable patchy/nodular and ground-glass opacities in the right upper lobe, While grossly unchanged, low grade adenocarcinoma remains possible.  . Stroke     Past Surgical History  Procedure Laterality Date  .  Cholecystectomy    . Abdominal hysterectomy    . Hip surgery    . Brain tumor excision      Meningioma; treated at [facility] in 2008  . Shoulder surgery    . Joint replacement      rt THR    History   Social History  . Marital Status: Widowed    Spouse Name: N/A  . Number of Children: 4  . Years of Education: N/A   Occupational History  .     Social History Main Topics  . Smoking status: Never Smoker   . Smokeless tobacco: Never Used  . Alcohol Use: No  . Drug Use: No  . Sexual Activity: No   Other Topics Concern  . Not on file   Social History Narrative   Widowed. Lives in [place], [place] with daughter.     Current Outpatient Prescriptions on File Prior to Visit  Medication Sig Dispense Refill  . B-D SYRINGE LUER-LOK 3CC 3 ML MISC USE AS DIRECTED ONCE A MONTH FOR B12 INJECTIONS 3 each 5  . BD DISP NEEDLES 25G X 5/8" MISC USE AS DIRECTED ONCE A MONTH FOR B12 INJECTIONS 3 each 3  . clopidogrel (PLAVIX) 75 MG tablet Take 1 tablet (75 mg total) by mouth daily with breakfast. 90 tablet 1  . cyanocobalamin (,VITAMIN B-12,) 1000 MCG/ML injection INJECT 1 ML (1000 MCG TOTAL)  INTO THE MUSCLE  EVERY 30 DAYS 3 mL 3  . cyclobenzaprine (FLEXERIL) 10 MG tablet TAKE 1 TABLET THREE TIMES A DAY AS NEEDED FOR MUSCLE SPASMS 270 tablet 1  . isosorbide mononitrate (IMDUR) 30 MG 24 hr tablet Take 0.5 tablets (15 mg total) by mouth daily. 90 tablet 1  . methimazole (TAPAZOLE) 5 MG tablet 1/2 tab daily 45 tablet 1  . nitroGLYCERIN (NITROSTAT) 0.4 MG SL tablet Place 1 tablet (0.4 mg total) under the tongue every 5 (five) minutes as needed. For chest pain. 15 tablet 3  . pravastatin (PRAVACHOL) 20 MG tablet Take 1 tablet (20 mg total) by mouth daily. Every evening 90 tablet 1  . PREMARIN vaginal cream Place 0.5 g vaginally as needed.     . traMADol (ULTRAM) 50 MG tablet Take 1 tablet (50 mg total) by mouth every 6 (six) hours as needed. For pain. 90 tablet 1  . triamcinolone ointment (KENALOG)  0.1 % APPLY 1 APPLICATION TOPICALLY TWICE A DAY FOR DRY SKIN 80 g 1   No current facility-administered medications on file prior to visit.    Allergies  Allergen Reactions  . Atorvastatin Other (See Comments)    Raises liver enzymes (takes pravastatin at home)  . Influenza Vaccines Other (See Comments)    Unknown  . Phenytoin Other (See Comments)    Blood clots  . Promethazine Hcl Other (See Comments)    Severe sedation  . Sulfacetamide Sodium     REACTION: sulfa  Per pt daughter she stated this causes a rash  . Sulfamethoxazole     REACTION: unspecified  Per pt daughter she stated this causes rash    Family History  Problem Relation Age of Onset  . Heart attack Brother 84    BP 118/78 mmHg  Pulse 67  Temp(Src) 97.7 F (36.5 C) (Oral)  Resp 15  Ht 5\' 4"  (1.626 m)  Wt 120 lb (54.432 kg)  BMI 20.59 kg/m2  SpO2 95%     Review of Systems Denies fever and weight change.     Objective:   Physical Exam Vital signs: see vs page Gen: elderly, frail, no distress NECK: There is no palpable thyroid enlargement.  No thyroid nodule is palpable.  No palpable lymphadenopathy at the anterior neck. Gait: slow but steady, with a cane.    Lab Results  Component Value Date   TSH 0.80 08/16/2014        Assessment & Plan:  Hyperthyroidism: well-controlled Chronic muscle weakness, worse Anemia: improved.    Patient is advised the following: Patient Instructions  blood tests are requested for you today.  We'll let you know about the results. if ever you have fever while taking methimazole, stop it and call us, because of the risk of a rare side-effect Because of your weakness, I'm also requesting the annual blood tests that Dr Burnice Logan would do in September. Please come back for a follow-up appointment in 4 months.  addendum: Please continue the same medication.

## 2014-09-28 ENCOUNTER — Ambulatory Visit (INDEPENDENT_AMBULATORY_CARE_PROVIDER_SITE_OTHER): Payer: Medicare Other | Admitting: Internal Medicine

## 2014-09-28 ENCOUNTER — Encounter: Payer: Self-pay | Admitting: Internal Medicine

## 2014-09-28 VITALS — BP 120/80 | HR 74 | Temp 97.6°F | Resp 24 | Ht 64.0 in | Wt 119.0 lb

## 2014-09-28 DIAGNOSIS — E785 Hyperlipidemia, unspecified: Secondary | ICD-10-CM | POA: Diagnosis not present

## 2014-09-28 DIAGNOSIS — D510 Vitamin B12 deficiency anemia due to intrinsic factor deficiency: Secondary | ICD-10-CM

## 2014-09-28 DIAGNOSIS — E0590 Thyrotoxicosis, unspecified without thyrotoxic crisis or storm: Secondary | ICD-10-CM | POA: Diagnosis not present

## 2014-09-28 DIAGNOSIS — I10 Essential (primary) hypertension: Secondary | ICD-10-CM

## 2014-09-28 DIAGNOSIS — G609 Hereditary and idiopathic neuropathy, unspecified: Secondary | ICD-10-CM

## 2014-09-28 MED ORDER — HYDROCODONE-ACETAMINOPHEN 5-325 MG PO TABS: 1.0000 | ORAL_TABLET | Freq: Four times a day (QID) | ORAL | Status: DC | PRN

## 2014-09-28 MED ORDER — GABAPENTIN 100 MG PO CAPS: 100.0000 mg | ORAL_CAPSULE | Freq: Every day | ORAL | Status: DC

## 2014-09-28 NOTE — Progress Notes (Signed)
Pre visit review using our clinic review tool, if applicable. No additional management support is needed unless otherwise documented below in the visit note. 

## 2014-09-28 NOTE — Patient Instructions (Signed)
Neurontin 1 tablet at bedtime  Return in one month for follow-up  Multivitamin once daily

## 2014-09-28 NOTE — Progress Notes (Signed)
Subjective:    Patient ID: [name], female    DOB: Apr 04, 1922, 81 y.o.   MRN: [id number]  HPI 81 year old patient who is followed by endocrinology for hyperthyroidism treated with Tapazole.  Recent lab and TSH unremarkable She has a long history of B12 deficiency and receives monthly B12 injections.  She has a number of complaints today including weakness, fatigue, back pain.  Her chief complaint is leg weakness and dysesthesias.  She states that she almost fell earlier today because of numbness involving her legs.  Past Medical History  Diagnosis Date  . ANEMIA, PERNICIOUS 09/10/2006  . CORONARY ARTERY DISEASE 11/07/2008    a. NSTEMI 2001 & STEMI 2008 managed medically. b. Takotsubo 06/2006. c. Cath 2009 -> med rx.  . CVA WITH LEFT HEMIPARESIS 07/22/2007    May, 2009, neurology decided aspirin and Plavix at that time.  . DERMATITIS 03/02/2007  . HYPERTENSION 07/15/2006  . MENINGIOMA 08/27/2006    Occipital meningioma, surgery, [facility], 2008 /  MRI, [facility], June, 2011, no change in lesion, history believe the patient had a gamma knife treatment of a right occipital atypical meningioma  . OSTEOARTHRITIS 07/15/2006  . PULMONARY NODULE 01/14/2007    [facility], December, 2011  . Takotsubo syndrome 04/22/2008    MI, May, 2008, question takotsubo event  . THYROID NODULE 01/14/2007  . IBS (irritable bowel syndrome)   . DJD (degenerative joint disease)     L shoulder impingement  . Ejection fraction     EF 60%, echo, July, 2010, (  EF improved after tach a suitable event 2008)  . Subarachnoid hemorrhage following injury 2007    Subarachnoid hemorrhage secondary to a fall December, 2007  . Bradycardia     August, 2013  . Family history of anesthesia complication     daughter has a hard time waking up  . H/O hiatal hernia   . Abnormal CT of the chest     Stable patchy/nodular and ground-glass opacities in the right upper lobe, While grossly unchanged, low grade adenocarcinoma remains  possible.  . Stroke     Social History   Social History  . Marital Status: Widowed    Spouse Name: N/A  . Number of Children: 4  . Years of Education: N/A   Occupational History  .     Social History Main Topics  . Smoking status: Never Smoker   . Smokeless tobacco: Never Used  . Alcohol Use: No  . Drug Use: No  . Sexual Activity: No   Other Topics Concern  . Not on file   Social History Narrative   Widowed. Lives in [place], [place] with daughter.     Past Surgical History  Procedure Laterality Date  . Cholecystectomy    . Abdominal hysterectomy    . Hip surgery    . Brain tumor excision      Meningioma; treated at [facility] in 2008  . Shoulder surgery    . Joint replacement      rt THR    Family History  Problem Relation Age of Onset  . Heart attack Brother 84    Allergies  Allergen Reactions  . Atorvastatin Other (See Comments)    Raises liver enzymes (takes pravastatin at home)  . Influenza Vaccines Other (See Comments)    Unknown  . Phenytoin Other (See Comments)    Blood clots  . Promethazine Hcl Other (See Comments)    Severe sedation  . Sulfacetamide Sodium     REACTION: sulfa  Per pt daughter she stated this causes a rash  . Sulfamethoxazole     REACTION: unspecified  Per pt daughter she stated this causes rash    Current Outpatient Prescriptions on File Prior to Visit  Medication Sig Dispense Refill  . B-D SYRINGE LUER-LOK 3CC 3 ML MISC USE AS DIRECTED ONCE A MONTH FOR B12 INJECTIONS 3 each 5  . BD DISP NEEDLES 25G X 5/8" MISC USE AS DIRECTED ONCE A MONTH FOR B12 INJECTIONS 3 each 3  . clopidogrel (PLAVIX) 75 MG tablet Take 1 tablet (75 mg total) by mouth daily with breakfast. 90 tablet 1  . cyanocobalamin (,VITAMIN B-12,) 1000 MCG/ML injection INJECT 1 ML (1000 MCG TOTAL) INTO THE MUSCLE  EVERY 30 DAYS 3 mL 3  . cyclobenzaprine (FLEXERIL) 10 MG tablet TAKE 1 TABLET THREE TIMES A DAY AS NEEDED FOR MUSCLE SPASMS 270 tablet 1  . isosorbide  mononitrate (IMDUR) 30 MG 24 hr tablet Take 0.5 tablets (15 mg total) by mouth daily. 90 tablet 1  . methimazole (TAPAZOLE) 5 MG tablet 1/2 tab daily (Patient taking differently: 5 mg. Monday, Wed and Friday) 45 tablet 1  . nitroGLYCERIN (NITROSTAT) 0.4 MG SL tablet Place 1 tablet (0.4 mg total) under the tongue every 5 (five) minutes as needed. For chest pain. 15 tablet 3  . pravastatin (PRAVACHOL) 20 MG tablet Take 1 tablet (20 mg total) by mouth daily. Every evening 90 tablet 1  . PREMARIN vaginal cream Place 0.5 g vaginally as needed.     . traMADol (ULTRAM) 50 MG tablet Take 1 tablet (50 mg total) by mouth every 6 (six) hours as needed. For pain. 90 tablet 1  . triamcinolone ointment (KENALOG) 0.1 % APPLY 1 APPLICATION TOPICALLY TWICE A DAY FOR DRY SKIN 80 g 1   No current facility-administered medications on file prior to visit.    BP 120/80 mmHg  Pulse 74  Temp(Src) 97.6 F (36.4 C) (Oral)  Resp 24  Ht 5\' 4"  (1.626 m)  Wt 119 lb (53.978 kg)  BMI 20.42 kg/m2  SpO2 96%      Review of Systems  Constitutional: Positive for fatigue.  HENT: Negative for congestion, dental problem, hearing loss, rhinorrhea, sinus pressure, sore throat and tinnitus.   Eyes: Negative for pain, discharge and visual disturbance.  Respiratory: Negative for cough and shortness of breath.   Cardiovascular: Negative for chest pain, palpitations and leg swelling.  Gastrointestinal: Negative for nausea, vomiting, abdominal pain, diarrhea, constipation, blood in stool and abdominal distention.  Genitourinary: Negative for dysuria, urgency, frequency, hematuria, flank pain, vaginal bleeding, vaginal discharge, difficulty urinating, vaginal pain and pelvic pain.  Musculoskeletal: Positive for back pain and arthralgias. Negative for joint swelling and gait problem.  Skin: Negative for rash.  Neurological: Positive for weakness, light-headedness and numbness. Negative for dizziness, syncope, speech difficulty and  headaches.  Hematological: Negative for adenopathy.  Psychiatric/Behavioral: Negative for behavioral problems, dysphoric mood and agitation. The patient is not nervous/anxious.        Objective:   Physical Exam  Constitutional: She is oriented to person, place, and time. She appears well-developed and well-nourished.  HENT:  Head: Normocephalic.  Right Ear: External ear normal.  Left Ear: External ear normal.  Mouth/Throat: Oropharynx is clear and moist.  Eyes: Conjunctivae and EOM are normal. Pupils are equal, round, and reactive to light.  Neck: Normal range of motion. Neck supple. No thyromegaly present.  Cardiovascular: Normal rate, regular rhythm, normal heart sounds and intact distal pulses.  Pulmonary/Chest: Effort normal and breath sounds normal.  Abdominal: Soft. Bowel sounds are normal. She exhibits no mass. There is no tenderness.  Musculoskeletal: Normal range of motion.  Lymphadenopathy:    She has no cervical adenopathy.  Neurological: She is alert and oriented to person, place, and time.  Patellar reflexes quite brisk.  Upper extremity reflexes also brisk  Absent vibratory sensation and monofilament testing of the lower extremities  No significant motor weakness  Skin: Skin is warm and dry. No rash noted.  Psychiatric: She has a normal mood and affect. Her behavior is normal.          Assessment & Plan:  Peripheral neuropathy.  Patient does have a history of impaired glucose tolerance.  Probable idiopathic.  Doubt related to Tapazole Hypertension, stable Osteoarthritis  We'll give trial of Neurontin 100 mg at bedtime.  Will recheck in one month and consider up titration.  Will also give a new prescription for Vicodin.  She seems quite uncomfortable.

## 2014-10-20 ENCOUNTER — Emergency Department (HOSPITAL_COMMUNITY)
Admission: EM | Admit: 2014-10-20 | Discharge: 2014-10-21 | Disposition: A | Discharge status: Home / Self Care | Payer: Medicare Other | Attending: Emergency Medicine | Admitting: Emergency Medicine

## 2014-10-20 ENCOUNTER — Encounter (HOSPITAL_COMMUNITY): Payer: Self-pay | Admitting: Emergency Medicine

## 2014-10-20 DIAGNOSIS — Z8709 Personal history of other diseases of the respiratory system: Secondary | ICD-10-CM | POA: Diagnosis not present

## 2014-10-20 DIAGNOSIS — Z87828 Personal history of other (healed) physical injury and trauma: Secondary | ICD-10-CM | POA: Insufficient documentation

## 2014-10-20 DIAGNOSIS — I2510 Atherosclerotic heart disease of native coronary artery without angina pectoris: Secondary | ICD-10-CM | POA: Insufficient documentation

## 2014-10-20 DIAGNOSIS — Z7902 Long term (current) use of antithrombotics/antiplatelets: Secondary | ICD-10-CM | POA: Diagnosis not present

## 2014-10-20 DIAGNOSIS — Z872 Personal history of diseases of the skin and subcutaneous tissue: Secondary | ICD-10-CM | POA: Insufficient documentation

## 2014-10-20 DIAGNOSIS — Z862 Personal history of diseases of the blood and blood-forming organs and certain disorders involving the immune mechanism: Secondary | ICD-10-CM | POA: Insufficient documentation

## 2014-10-20 DIAGNOSIS — Z8673 Personal history of transient ischemic attack (TIA), and cerebral infarction without residual deficits: Secondary | ICD-10-CM | POA: Diagnosis not present

## 2014-10-20 DIAGNOSIS — Z7952 Long term (current) use of systemic steroids: Secondary | ICD-10-CM | POA: Insufficient documentation

## 2014-10-20 DIAGNOSIS — I10 Essential (primary) hypertension: Secondary | ICD-10-CM | POA: Insufficient documentation

## 2014-10-20 DIAGNOSIS — Z79899 Other long term (current) drug therapy: Secondary | ICD-10-CM | POA: Diagnosis not present

## 2014-10-20 DIAGNOSIS — M1990 Unspecified osteoarthritis, unspecified site: Secondary | ICD-10-CM | POA: Insufficient documentation

## 2014-10-20 DIAGNOSIS — R42 Dizziness and giddiness: Secondary | ICD-10-CM | POA: Insufficient documentation

## 2014-10-20 LAB — CBG MONITORING, ED: GLUCOSE-CAPILLARY: 117 mg/dL — AB (ref 65–99)

## 2014-10-20 MED ORDER — MECLIZINE HCL 25 MG PO TABS
25.0000 mg | ORAL_TABLET | Freq: Once | ORAL | Status: AC
  Administered 2014-10-21: 25 mg via ORAL
  Filled 2014-10-20: qty 1

## 2014-10-20 NOTE — ED Notes (Signed)
Pt c/o dizziness and hypertension since 1800 tonight. C/o nausea. No hx of hypertensive medications currently.

## 2014-10-20 NOTE — ED Notes (Signed)
EKG delayed once pt was roomed in acute side.

## 2014-10-20 NOTE — ED Provider Notes (Signed)
History  This chart was scribed for Everlene Balls, MD by Marlowe Kays, ED Scribe. This patient was seen in room WA14/WA14 and the patient's care was started at 11:26 PM.   Chief Complaint  Patient presents with  . Dizziness   HPI  HPI Comments:  [name] is a 81 y.o. female that lives alone who presents to the Emergency Department complaining of new onset dizziness that began approximately four hours ago while sitting down eating. Her daughter reports the patient took her BP at home and it read about 180/80 stating that she normally runs 774 systolic. Pt reports generalized weakness. She has not done anything to treat the symptoms. Pt reports associated decrease in urination. She reports her last bowel movement was earlier this morning and describes it as hard stool. She states she has one bowel movement weekly and it is normally hard because she drinks Ensure. Movement of her head makes the dizziness worse. She denies alleviating factors. Her daughter denies hearing any slurred speech. Pt denies rhinorrhea, cough, loss of appetite, dysuria, hematuria, diarrhea, numbness, tingling or weakness of any extremity. She has PMHx of CVA.  Past Medical History  Diagnosis Date  . ANEMIA, PERNICIOUS 09/10/2006  . CORONARY ARTERY DISEASE 11/07/2008    a. NSTEMI 2001 & STEMI 2008 managed medically. b. Takotsubo 06/2006. c. Cath 2009 -> med rx.  . CVA WITH LEFT HEMIPARESIS 07/22/2007    May, 2009, neurology decided aspirin and Plavix at that time.  . DERMATITIS 03/02/2007  . HYPERTENSION 07/15/2006  . MENINGIOMA 08/27/2006    Occipital meningioma, surgery, [facility], 2008 /  MRI, [facility], June, 2011, no change in lesion, history believe the patient had a gamma knife treatment of a right occipital atypical meningioma  . OSTEOARTHRITIS 07/15/2006  . PULMONARY NODULE 01/14/2007    [facility], December, 2011  . Takotsubo syndrome 04/22/2008    MI, May, 2008, question takotsubo event  . THYROID NODULE 01/14/2007   . IBS (irritable bowel syndrome)   . DJD (degenerative joint disease)     L shoulder impingement  . Ejection fraction     EF 60%, echo, July, 2010, (  EF improved after tach a suitable event 2008)  . Subarachnoid hemorrhage following injury 2007    Subarachnoid hemorrhage secondary to a fall December, 2007  . Bradycardia     August, 2013  . Family history of anesthesia complication     daughter has a hard time waking up  . H/O hiatal hernia   . Abnormal CT of the chest     Stable patchy/nodular and ground-glass opacities in the right upper lobe, While grossly unchanged, low grade adenocarcinoma remains possible.  . Stroke    Past Surgical History  Procedure Laterality Date  . Cholecystectomy    . Abdominal hysterectomy    . Hip surgery    . Brain tumor excision      Meningioma; treated at [facility] in 2008  . Shoulder surgery    . Joint replacement      rt THR   Family History  Problem Relation Age of Onset  . Heart attack Brother 59   Social History  Substance Use Topics  . Smoking status: Never Smoker   . Smokeless tobacco: Never Used  . Alcohol Use: No   OB History    No data available     Review of Systems A complete 10 system review of systems was obtained and all systems are negative except as noted in the HPI and PMH.  Allergies  Atorvastatin; Influenza vaccines; Phenytoin; Promethazine hcl; Sulfacetamide sodium; and Sulfamethoxazole  Home Medications   Prior to Admission medications   Medication Sig Start Date End Date Taking? Authorizing Provider  clopidogrel (PLAVIX) 75 MG tablet Take 1 tablet (75 mg total) by mouth daily with breakfast. 04/18/14  Yes Marletta Lor, MD  cyanocobalamin (,VITAMIN B-12,) 1000 MCG/ML injection INJECT 1 ML (1000 MCG TOTAL) INTO THE MUSCLE  EVERY 30 DAYS 04/18/14  Yes Marletta Lor, MD  cyclobenzaprine (FLEXERIL) 10 MG tablet TAKE 1 TABLET THREE TIMES A DAY AS NEEDED FOR MUSCLE SPASMS Patient taking differently:  Take 10 mg by mouth 3 (three) times daily as needed for muscle spasms.  04/18/14  Yes Marletta Lor, MD  HYDROcodone-acetaminophen (NORCO/VICODIN) 5-325 MG per tablet Take 1 tablet by mouth every 6 (six) hours as needed for moderate pain. 09/28/14  Yes Marletta Lor, MD  isosorbide mononitrate (IMDUR) 30 MG 24 hr tablet Take 0.5 tablets (15 mg total) by mouth daily. 04/18/14  Yes Marletta Lor, MD  methimazole (TAPAZOLE) 5 MG tablet 1/2 tab daily Patient taking differently: 5 mg. Monday, Wed and Friday 04/19/14  Yes Renato Shin, MD  nitroGLYCERIN (NITROSTAT) 0.4 MG SL tablet Place 1 tablet (0.4 mg total) under the tongue every 5 (five) minutes as needed. For chest pain. 05/04/13  Yes Marletta Lor, MD  pravastatin (PRAVACHOL) 20 MG tablet Take 1 tablet (20 mg total) by mouth daily. Every evening 04/18/14  Yes Marletta Lor, MD  PREMARIN vaginal cream Place 0.5 g vaginally as needed.  10/07/13  Yes Historical Provider, MD  triamcinolone ointment (KENALOG) 0.1 % APPLY 1 APPLICATION TOPICALLY TWICE A DAY FOR DRY SKIN Patient taking differently: Apply 1 application topically 2 (two) times daily. FOR DRY SKIN 04/18/14  Yes Marletta Lor, MD  B-D SYRINGE LUER-LOK 3CC 3 ML MISC USE AS DIRECTED ONCE A MONTH FOR B12 INJECTIONS 04/06/14   Marletta Lor, MD  BD DISP NEEDLES 25G X 5/8" MISC USE AS DIRECTED ONCE A MONTH FOR B12 INJECTIONS 01/04/14   Marletta Lor, MD  gabapentin (NEURONTIN) 100 MG capsule Take 1 capsule (100 mg total) by mouth at bedtime. Patient not taking: Reported on 10/20/2014 09/28/14   Marletta Lor, MD  traMADol (ULTRAM) 50 MG tablet Take 1 tablet (50 mg total) by mouth every 6 (six) hours as needed. For pain. Patient not taking: Reported on 10/20/2014 04/18/14   Marletta Lor, MD   Triage Vitals: BP 176/75 mmHg  Pulse 69  Temp(Src) 98.1 F (36.7 C) (Oral)  Resp 18  SpO2 100% Physical Exam  Constitutional: She is oriented to person,  place, and time. She appears well-developed and well-nourished. No distress.  HENT:  Head: Normocephalic and atraumatic.  Nose: Nose normal.  Mouth/Throat: Oropharynx is clear and moist. No oropharyngeal exudate.  Eyes: Conjunctivae and EOM are normal. Pupils are equal, round, and reactive to light. No scleral icterus.  Neck: Normal range of motion. Neck supple. No JVD present. No tracheal deviation present. No thyromegaly present.  Cardiovascular: Normal rate, regular rhythm and normal heart sounds.  Exam reveals no gallop and no friction rub.   No murmur heard. Pulmonary/Chest: Effort normal and breath sounds normal. No respiratory distress. She has no wheezes. She exhibits no tenderness.  Abdominal: Soft. Bowel sounds are normal. She exhibits no distension and no mass. There is no tenderness. There is no rebound and no guarding.  Musculoskeletal: Normal range of motion. She  exhibits no edema or tenderness.  Lymphadenopathy:    She has no cervical adenopathy.  Neurological: She is alert and oriented to person, place, and time. No cranial nerve deficit. She exhibits normal muscle tone.  Normal strength and sensation in all extremities. Normal cerebellar testing. Appears dizzy when she turns head.  Skin: Skin is warm and dry. No rash noted. No erythema. No pallor.  Nursing note and vitals reviewed.   ED Course  Procedures (including critical care time) DIAGNOSTIC STUDIES: Oxygen Saturation is 100% on RA, normal by my interpretation.   COORDINATION OF CARE: 11:33 PM- Will transfer to [facility] to have MRI. Pt verbalizes understanding and agrees to plan.  Medications  0.9 %  sodium chloride infusion ( Intravenous New Bag/Given 10/21/14 0013)  meclizine (ANTIVERT) tablet 25 mg (25 mg Oral Given 10/21/14 0013)   Labs Review Labs Reviewed  BASIC METABOLIC PANEL - Abnormal; Notable for the following:    Glucose, Bld 120 (*)    BUN 25 (*)    All other components within normal limits  CBC -  Abnormal; Notable for the following:    Hemoglobin 11.9 (*)    All other components within normal limits  URINALYSIS, ROUTINE W REFLEX MICROSCOPIC (NOT AT [facility]) - Abnormal; Notable for the following:    Leukocytes, UA MODERATE (*)    All other components within normal limits  URINE MICROSCOPIC-ADD ON - Abnormal; Notable for the following:    Squamous Epithelial / LPF FEW (*)    All other components within normal limits  CBG MONITORING, ED - Abnormal; Notable for the following:    Glucose-Capillary 117 (*)    All other components within normal limits    Imaging Review Mr Jeri Cos Wo Contrast  10/21/2014   CLINICAL DATA:  New onset dizziness 4 hours ago with while eating. Hypertensive, generalized weakness. Dizziness worse with head movement. History of stroke, meningioma, hypertension.  EXAM: MRI HEAD WITHOUT AND WITH CONTRAST  TECHNIQUE: Multiplanar, multiecho pulse sequences of the brain and surrounding structures were obtained without and with intravenous contrast.  CONTRAST:  33mL MULTIHANCE GADOBENATE DIMEGLUMINE 529 MG/ML IV SOLN  COMPARISON:  CT head September 20, 2012 and MRI of the brain October 14, 2014  FINDINGS: No reduced diffusion to suggest acute ischemia. No susceptibility artifact to suggest hemorrhage. RIGHT occipital lobe encephalomalacia, slight ex vacuo dilatation of the RIGHT ventricle atrium, the ventricles and sulci are otherwise normal for patient's age. Small areas of RIGHT frontoparietal encephalomalacia. Patchy to confluent supratentorial white matter T2 hyperintensities exclusive of the aforementioned abnormality. Small old RIGHT cerebellar infarcts. Tiny T2 hyperintensities in the bilateral thalamus and basal ganglia suggest perivascular spaces of the superimposed chronic probable basal ganglia lacunar infarcts. LEFT frontal developmental venous anomaly. No suspicious intraparenchymal enhancement.  No abnormal extra-axial fluid collections. Normal major intracranial vascular  flow voids seen at the skull base. No abnormal extra-axial enhancement or, mass.  Status post bilateral ocular lens implants. LEFT sphenoid sinus mucosal thickening with air-fluid level. RIGHT concha bullosa. The mastoid air cells are well aerated. Patient is edentulous. No abnormal sellar expansion. No cerebellar tonsillar ectopia. Old RIGHT occipital craniotomy.  IMPRESSION: No acute intracranial process, specifically no acute ischemia.  RIGHT occipital craniotomy for reported meningioma resection, similar subjacent encephalomalacia without MR findings of residual nor recurrent tumor.  Stable chronic changes including small old RIGHT MCA territory infarcts, RIGHT cerebellar infarcts.  Mild sphenoid sinusitis.   Electronically Signed   By: Elon Alas M.D.   On:  10/21/2014 03:08   I have personally reviewed and evaluated these images and lab results as part of my medical decision-making.   EKG Interpretation   Date/Time:  Thursday October 20 2014 23:41:15 EDT Ventricular Rate:  71 PR Interval:  217 QRS Duration: 88 QT Interval:  431 QTC Calculation: 468 R Axis:   -7 Text Interpretation:  Sinus rhythm Premature atrial complexes Premature  ventricular complexes No significant change since last tracing Confirmed  by Glynn Octave [phone]) on 10/21/2014 12:02:50 AM      MDM   Final diagnoses:  None    Patient presents to the ED for dizziness. She denies ever having dizziness in the past.  She has history of stroke and needs an MRI to rule out stroke.  This was ordered and patient will be [facility].  Have given meclizine and IVF in the mean time to see if symptoms improve.    Dizzinesss improved after medications.  MRI negative for acute infarct. Patient safe for DC with neurology follow up and meclizine Rx.  I personally performed the services described in this documentation, which was scribed in my presence. The recorded information has been  reviewed and is accurate.    Everlene Balls, MD 10/21/14 [phone]

## 2014-10-20 NOTE — ED Notes (Signed)
MD at bedside. 

## 2014-10-21 ENCOUNTER — Ambulatory Visit (HOSPITAL_COMMUNITY)
Admit: 2014-10-21 | Discharge: 2014-10-21 | Disposition: A | Discharge status: Home / Self Care | Payer: Medicare Other | Attending: Emergency Medicine | Admitting: Emergency Medicine

## 2014-10-21 ENCOUNTER — Telehealth: Payer: Self-pay | Admitting: Internal Medicine

## 2014-10-21 DIAGNOSIS — R42 Dizziness and giddiness: Secondary | ICD-10-CM | POA: Diagnosis not present

## 2014-10-21 LAB — BASIC METABOLIC PANEL
ANION GAP: 8 (ref 5–15)
BUN: 25 mg/dL — ABNORMAL HIGH (ref 6–20)
CALCIUM: 9.3 mg/dL (ref 8.9–10.3)
CHLORIDE: 108 mmol/L (ref 101–111)
CO2: 25 mmol/L (ref 22–32)
Creatinine, Ser: 0.81 mg/dL (ref 0.44–1.00)
GFR calc non Af Amer: >60 mL/min (ref >60)
GLUCOSE: 120 mg/dL — AB (ref 65–99)
Potassium: 3.8 mmol/L (ref 3.5–5.1)
Sodium: 141 mmol/L (ref 135–145)

## 2014-10-21 LAB — CBC
HCT: 36.7 % (ref 36.0–46.0)
HEMOGLOBIN: 11.9 g/dL — AB (ref 12.0–15.0)
MCH: 29.5 pg (ref 26.0–34.0)
MCHC: 32.4 g/dL (ref 30.0–36.0)
MCV: 91.1 fL (ref 78.0–100.0)
Platelets: 197 10*3/uL (ref 150–400)
RBC: 4.03 MIL/uL (ref 3.87–5.11)
RDW: 14.8 % (ref 11.5–15.5)
WBC: 8.1 10*3/uL (ref 4.0–10.5)

## 2014-10-21 LAB — URINALYSIS, ROUTINE W REFLEX MICROSCOPIC
Bilirubin Urine: NEGATIVE
GLUCOSE, UA: NEGATIVE mg/dL
HGB URINE DIPSTICK: NEGATIVE
Ketones, ur: NEGATIVE mg/dL
Nitrite: NEGATIVE
PH: 6 (ref 5.0–8.0)
Protein, ur: NEGATIVE mg/dL
SPECIFIC GRAVITY, URINE: 1.014 (ref 1.005–1.030)
Urobilinogen, UA: 0.2 mg/dL (ref 0.0–1.0)

## 2014-10-21 LAB — URINE MICROSCOPIC-ADD ON

## 2014-10-21 MED ORDER — GADOBENATE DIMEGLUMINE 529 MG/ML IV SOLN
15.0000 mL | Freq: Once | INTRAVENOUS | Status: AC | PRN
  Administered 2014-10-21: 15 mL via INTRAVENOUS

## 2014-10-21 MED ORDER — SODIUM CHLORIDE 0.9 % IV SOLN
INTRAVENOUS | Status: DC
  Administered 2014-10-21: via INTRAVENOUS

## 2014-10-21 MED ORDER — MECLIZINE HCL 25 MG PO TABS: 25.0000 mg | ORAL_TABLET | Freq: Two times a day (BID) | ORAL | Status: DC | PRN

## 2014-10-21 NOTE — Telephone Encounter (Signed)
FYI

## 2014-10-21 NOTE — ED Notes (Signed)
Patient transported to MRI 

## 2014-10-21 NOTE — Telephone Encounter (Signed)
[facility] - Client [facility] Patient Name: [name] Want Gender: Female DOB: 10-27-1922 Age: 81 Y 36 M 29 D Return Phone Number: [phone] (Primary), [phone] (Secondary) Address: City/State/Zip: [place] Client [facility] - Client Client Site [facility] - Night Physician Simonne Martinet Contact Type Fax Call Type Triage / Clinical Caller Name Mrs. Coburn Relationship To Patient Daughter Return Phone Number [phone] (Primary) Chief Complaint Dizziness Initial Comment Caller states her mother is having dizziness and she has a blood pressure 170/80 PreDisposition Go to Urgent Care/Walk-In Clinic Nurse Assessment Nurse: Denyse Amass, RN, Benjamine Mola Date/Time (Eastern Time): 10/20/2014 8:51:06 PM Confirm and document reason for call. If symptomatic, describe symptoms. ---Daughter states patient says she is dizzy and her blood pressure is 170/80. Dizziness increases with movement of the head. Has the patient traveled out of the country within the last 30 days? ---Not Applicable Does the patient require triage? ---Yes Related visit to physician within the last 2 weeks? ---No Does the PT have any chronic conditions? (i.e. diabetes, asthma, etc.) ---Yes List chronic conditions. ---S/P CVA S/P MI High Cholesterol Guidelines Guideline Title Affirmed Question Affirmed Notes Nurse Date/Time (Eastern Time) Dizziness - Vertigo [1] Dizziness (vertigo) present now AND [2] one or more stroke risk factors (i.e., hypertension, diabetes, prior stroke/ TIA/heart attack) (Exception: prior physician evaluation for this AND no different/ worse than usual) [place], RN, Benjamine Mola 10/20/2014 8:54:02 PM Disp. Time

## 2014-10-21 NOTE — ED Notes (Signed)
Bed: ZR00 Expected date:  Expected time:  Means of arrival:  Comments: MRI

## 2014-10-21 NOTE — ED Notes (Signed)
Patient returned from MRI with [place].

## 2014-10-21 NOTE — ED Notes (Addendum)
Patient transported to MRI @ [facility]

## 2014-10-21 NOTE — ED Notes (Signed)
Blood clicked off in error, no blood draw

## 2014-10-21 NOTE — Discharge Instructions (Signed)
Dizziness [name], your MRI results are below and do not show any new stroke.  You likely have vertigo and need to see neurology within 3 days for close follow up.  If symptoms worsen, come back to the ED immediately.  You can take meclizine as needed for your dizziness.  Thank you.  IMPRESSION: No acute intracranial process, specifically no acute ischemia.  RIGHT occipital craniotomy for reported meningioma resection, similar subjacent encephalomalacia without MR findings of residual nor recurrent tumor.  Stable chronic changes including small old RIGHT MCA territory infarcts, RIGHT cerebellar infarcts.  Mild sphenoid sinusitis.   Dizziness means you feel unsteady or lightheaded. You might feel like you are going to pass out (faint). HOME CARE   Drink enough fluids to keep your pee (urine) clear or pale yellow.  Take your medicines exactly as told by your doctor. If you take blood pressure medicine, always stand up slowly from the lying or sitting position. Hold on to something to steady yourself.  If you need to stand in one place for a long time, move your legs often. Tighten and relax your leg muscles.  Have someone stay with you until you feel okay.  Do not drive or use heavy machinery if you feel dizzy.  Do not drink alcohol. GET HELP RIGHT AWAY IF:   You feel dizzy or lightheaded and it gets worse.  You feel sick to your stomach (nauseous), or you throw up (vomit).  You have trouble talking or walking.  You feel weak or have trouble using your arms, hands, or legs.  You cannot think clearly or have trouble forming sentences.  You have chest pain, belly (abdominal) pain, sweating, or you are short of breath.  Your vision changes.  You are bleeding.  You have problems from your medicine that seem to be getting worse. MAKE SURE YOU:   Understand these instructions.  Will watch your condition.  Will get help right away if you are not doing well or get  worse. Document Released: 01/10/2011 Document Revised: 04/15/2011 Document Reviewed: 01/10/2011 [facility] Patient Information 2015 [place], [place]. This information is not intended to replace advice given to you by your health care provider. Make sure you discuss any questions you have with your health care provider.

## 2014-10-25 ENCOUNTER — Encounter: Payer: Self-pay | Admitting: Internal Medicine

## 2014-10-25 ENCOUNTER — Ambulatory Visit (INDEPENDENT_AMBULATORY_CARE_PROVIDER_SITE_OTHER): Payer: Medicare Other | Admitting: Internal Medicine

## 2014-10-25 VITALS — BP 100/60 | HR 67 | Temp 98.0°F | Resp 18 | Ht 64.0 in | Wt 119.0 lb

## 2014-10-25 DIAGNOSIS — M150 Primary generalized (osteo)arthritis: Secondary | ICD-10-CM | POA: Diagnosis not present

## 2014-10-25 DIAGNOSIS — E0590 Thyrotoxicosis, unspecified without thyrotoxic crisis or storm: Secondary | ICD-10-CM | POA: Diagnosis not present

## 2014-10-25 DIAGNOSIS — M8949 Other hypertrophic osteoarthropathy, multiple sites: Secondary | ICD-10-CM

## 2014-10-25 DIAGNOSIS — I69959 Hemiplegia and hemiparesis following unspecified cerebrovascular disease affecting unspecified side: Secondary | ICD-10-CM

## 2014-10-25 DIAGNOSIS — M159 Polyosteoarthritis, unspecified: Secondary | ICD-10-CM

## 2014-10-25 DIAGNOSIS — D510 Vitamin B12 deficiency anemia due to intrinsic factor deficiency: Secondary | ICD-10-CM

## 2014-10-25 NOTE — Progress Notes (Signed)
Subjective:    Patient ID: [name], female    DOB: September 13, 1922, 81 y.o.   MRN: [id number]  HPI  81 year old patient who is seen today for her six-month follow-up.  She has a history of essential hypertension which has been very well controlled. She has a history of cerebrovascular disease and is on statin therapy.  She was seen in the ED recently for vertigo and brain MRI was reviewed.  This revealed evidence of her prior stroke but no acute ischemia.  She has done well since this ED visit Her main complaint which has been chronic, is weakness. She is followed by endocrinology for hyperthyroidism and has recently been resumed on low-dose Tapazole.  TSH normal  Past Medical History  Diagnosis Date  . ANEMIA, PERNICIOUS 09/10/2006  . CORONARY ARTERY DISEASE 11/07/2008    a. NSTEMI 2001 & STEMI 2008 managed medically. b. Takotsubo 06/2006. c. Cath 2009 -> med rx.  . CVA WITH LEFT HEMIPARESIS 07/22/2007    May, 2009, neurology decided aspirin and Plavix at that time.  . DERMATITIS 03/02/2007  . HYPERTENSION 07/15/2006  . MENINGIOMA 08/27/2006    Occipital meningioma, surgery, [facility], 2008 /  MRI, [place], June, 2011, no change in lesion, history believe the patient had a gamma knife treatment of a right occipital atypical meningioma  . OSTEOARTHRITIS 07/15/2006  . PULMONARY NODULE 01/14/2007    [facility], December, 2011  . Takotsubo syndrome 04/22/2008    MI, May, 2008, question takotsubo event  . THYROID NODULE 01/14/2007  . IBS (irritable bowel syndrome)   . DJD (degenerative joint disease)     L shoulder impingement  . Ejection fraction     EF 60%, echo, July, 2010, (  EF improved after tach a suitable event 2008)  . Subarachnoid hemorrhage following injury 2007    Subarachnoid hemorrhage secondary to a fall December, 2007  . Bradycardia     August, 2013  . Family history of anesthesia complication     daughter has a hard time waking up  . H/O hiatal hernia   . Abnormal CT of the  chest     Stable patchy/nodular and ground-glass opacities in the right upper lobe, While grossly unchanged, low grade adenocarcinoma remains possible.  . Stroke     Social History   Social History  . Marital Status: Widowed    Spouse Name: N/A  . Number of Children: 4  . Years of Education: N/A   Occupational History  .     Social History Main Topics  . Smoking status: Never Smoker   . Smokeless tobacco: Never Used  . Alcohol Use: No  . Drug Use: No  . Sexual Activity: No   Other Topics Concern  . Not on file   Social History Narrative   Widowed. Lives in [place], [place] with daughter.     Past Surgical History  Procedure Laterality Date  . Cholecystectomy    . Abdominal hysterectomy    . Hip surgery    . Brain tumor excision      Meningioma; [facility] at [facility] in 2008  . Shoulder surgery    . Joint replacement      rt THR    Family History  Problem Relation Age of Onset  . Heart attack Brother 84    Allergies  Allergen Reactions  . Atorvastatin Other (See Comments)    Raises liver enzymes (takes pravastatin at home)  . Influenza Vaccines Other (See Comments)    Unknown  .  Phenytoin Other (See Comments)    Blood clots  . Promethazine Hcl Other (See Comments)    Severe sedation  . Sulfacetamide Sodium     REACTION: sulfa  Per pt daughter she stated this causes a rash  . Sulfamethoxazole     REACTION: unspecified  Per pt daughter she stated this causes rash    Current Outpatient Prescriptions on File Prior to Visit  Medication Sig Dispense Refill  . B-D SYRINGE LUER-LOK 3CC 3 ML MISC USE AS DIRECTED ONCE A MONTH FOR B12 INJECTIONS 3 each 5  . BD DISP NEEDLES 25G X 5/8" MISC USE AS DIRECTED ONCE A MONTH FOR B12 INJECTIONS 3 each 3  . clopidogrel (PLAVIX) 75 MG tablet Take 1 tablet (75 mg total) by mouth daily with breakfast. 90 tablet 1  . cyanocobalamin (,VITAMIN B-12,) 1000 MCG/ML injection INJECT 1 ML (1000 MCG TOTAL) INTO THE MUSCLE  EVERY 30  DAYS 3 mL 3  . cyclobenzaprine (FLEXERIL) 10 MG tablet TAKE 1 TABLET THREE TIMES A DAY AS NEEDED FOR MUSCLE SPASMS (Patient taking differently: Take 10 mg by mouth 3 (three) times daily as needed for muscle spasms. ) 270 tablet 1  . HYDROcodone-acetaminophen (NORCO/VICODIN) 5-325 MG per tablet Take 1 tablet by mouth every 6 (six) hours as needed for moderate pain. 30 tablet 0  . isosorbide mononitrate (IMDUR) 30 MG 24 hr tablet Take 0.5 tablets (15 mg total) by mouth daily. 90 tablet 1  . methimazole (TAPAZOLE) 5 MG tablet 1/2 tab daily (Patient taking differently: 5 mg. Monday, Wed and Friday) 45 tablet 1  . nitroGLYCERIN (NITROSTAT) 0.4 MG SL tablet Place 1 tablet (0.4 mg total) under the tongue every 5 (five) minutes as needed. For chest pain. 15 tablet 3  . pravastatin (PRAVACHOL) 20 MG tablet Take 1 tablet (20 mg total) by mouth daily. Every evening 90 tablet 1  . PREMARIN vaginal cream Place 0.5 g vaginally as needed.     . triamcinolone ointment (KENALOG) 0.1 % APPLY 1 APPLICATION TOPICALLY TWICE A DAY FOR DRY SKIN (Patient taking differently: Apply 1 application topically 2 (two) times daily. FOR DRY SKIN) 80 g 1  . meclizine (ANTIVERT) 25 MG tablet Take 1 tablet (25 mg total) by mouth 2 (two) times daily as needed for dizziness. (Patient not taking: Reported on 10/25/2014) 10 tablet 0  . [DISCONTINUED] gabapentin (NEURONTIN) 100 MG capsule Take 1 capsule (100 mg total) by mouth at bedtime. (Patient not taking: Reported on 10/20/2014) 90 capsule 2   No current facility-administered medications on file prior to visit.    BP 100/60 mmHg  Pulse 67  Temp(Src) 98 F (36.7 C) (Oral)  Resp 18  Ht 5\' 4"  (1.626 m)  Wt 119 lb (53.978 kg)  BMI 20.42 kg/m2  SpO2 97%     Review of Systems  Constitutional: Positive for fatigue.  HENT: Negative for congestion, dental problem, hearing loss, rhinorrhea, sinus pressure, sore throat and tinnitus.   Eyes: Negative for pain, discharge and visual  disturbance.  Respiratory: Negative for cough and shortness of breath.   Cardiovascular: Negative for chest pain, palpitations and leg swelling.  Gastrointestinal: Negative for nausea, vomiting, abdominal pain, diarrhea, constipation, blood in stool and abdominal distention.  Genitourinary: Negative for dysuria, urgency, frequency, hematuria, flank pain, vaginal bleeding, vaginal discharge, difficulty urinating, vaginal pain and pelvic pain.  Musculoskeletal: Negative for joint swelling, arthralgias and gait problem.  Skin: Negative for rash.  Neurological: Positive for dizziness and weakness. Negative for syncope, speech  difficulty, numbness and headaches.  Hematological: Negative for adenopathy.  Psychiatric/Behavioral: Negative for behavioral problems, dysphoric mood and agitation. The patient is not nervous/anxious.        Objective:   Physical Exam  Constitutional: She is oriented to person, place, and time. She appears well-developed and well-nourished.  Blood pressure low normal  HENT:  Head: Normocephalic.  Right Ear: External ear normal.  Left Ear: External ear normal.  Mouth/Throat: Oropharynx is clear and moist.  Eyes: Conjunctivae and EOM are normal. Pupils are equal, round, and reactive to light.  Neck: Normal range of motion. Neck supple. No thyromegaly present.  Cardiovascular: Normal rate, regular rhythm, normal heart sounds and intact distal pulses.   Pulmonary/Chest: Effort normal and breath sounds normal.  Abdominal: Soft. Bowel sounds are normal. She exhibits no mass. There is no tenderness.  Musculoskeletal: Normal range of motion.  Lymphadenopathy:    She has no cervical adenopathy.  Neurological: She is alert and oriented to person, place, and time.  Skin: Skin is warm and dry. No rash noted.  Psychiatric: She has a normal mood and affect. Her behavior is normal.          Assessment & Plan:   Weakness.  This is a chronic complaint and multi-factorial  with age.  Arthritis and deconditioning.  All factors.  The daughter's concern about pravastatin is a possible factor.  We'll place on hold for 4 weeks and then resume if no clinical improvement.  The patient has been given a trial in the past of low-dose SSRI, which she did not tolerate well   Hypertension, well-controlled Cerebral vascular disease History of hyperthyroidism, controlled.  Continue low-dose Tapazole.  Follow-up endocrinology  Recheck here 6 months

## 2014-10-25 NOTE — Patient Instructions (Addendum)
Limit your sodium (Salt) intake    It is important that you exercise regularly, at least 20 minutes 3 to 4 times per week.  If you develop chest pain or shortness of breath seek  medical attention.  Return in 6 months for follow-up   Hold pravastatin for 4 weeks.  If no improvement with the weakness, then resume this medication.  If there is significant improvement, then continue off this medication

## 2014-10-25 NOTE — Progress Notes (Signed)
Pre visit review using our clinic review tool, if applicable. No additional management support is needed unless otherwise documented below in the visit note. 

## 2014-11-14 ENCOUNTER — Encounter: Payer: Self-pay | Admitting: Neurology

## 2014-11-14 ENCOUNTER — Ambulatory Visit (INDEPENDENT_AMBULATORY_CARE_PROVIDER_SITE_OTHER): Payer: Medicare Other | Admitting: Neurology

## 2014-11-14 VITALS — BP 158/70 | HR 68 | Resp 16 | Wt 119.0 lb

## 2014-11-14 DIAGNOSIS — Z86018 Personal history of other benign neoplasm: Secondary | ICD-10-CM | POA: Diagnosis not present

## 2014-11-14 DIAGNOSIS — Z8673 Personal history of transient ischemic attack (TIA), and cerebral infarction without residual deficits: Secondary | ICD-10-CM | POA: Diagnosis not present

## 2014-11-14 DIAGNOSIS — R42 Dizziness and giddiness: Secondary | ICD-10-CM

## 2014-11-14 DIAGNOSIS — I10 Essential (primary) hypertension: Secondary | ICD-10-CM

## 2014-11-14 NOTE — Patient Instructions (Signed)
1. If symptoms recur, take meclizine as needed. If symptoms persist despite medication, go to ER. 2. Continue all your medications 3. Look into exercise programs for muscle strengthening 4. Follow-up as needed, call for any problems

## 2014-11-14 NOTE — Progress Notes (Signed)
NEUROLOGY CONSULTATION NOTE  [name] MRN: [id number] DOB: Feb 26, 1922  Referring provider: Dr. Everlene Balls (ER) Primary care provider: Dr. Bluford Kaufmann  Reason for consult:  vertigo  Dear Dr Claudine Mouton:  Thank you for your kind referral of [name] for consultation of the above symptoms. Although her history is well known to you, please allow me to reiterate it for the purpose of our medical record. The patient was accompanied to the clinic by her daughter who also provides collateral information. Records and images were personally reviewed where available.  HISTORY OF PRESENT ILLNESS: This is a pleasant 81 year old right-handed woman with a history of right occipital atypical meningioma s/p gamma knife treatment in 2011, strokes in 2009 and 2012 with no residual deficits, CAD, hypertension, in her usual state of health until 10/20/14. She lives alone and was eating at home when she started having vertigo described as "everything spinning." She called her daughter, who came and took her BP, noting it to be 180/80. She reported nausea one time. Her daughter brought her to the ER where bloodwork was unremarkable except for mildly elevated BUN 25, negative urinalysis. She had an MRI brain with and without contrast which I personally reviewed which did not show any acute infarct. There was unchanged right occipital craniotomy with subjacent encephalomalacia, old small right MCA territory infarcts and right cerebellar infarcts. She was given IV fluids and discharged home with PO meclizine, which she took for 2-3 days until symptoms resolved. No recurrence of symptoms in the past month. She will be seeing an audiologist tomorrow, denies any ear pain, tinnitus. She denies any headaches, diplopia, dysarthria, dysphagia, neck/back pain, focal numbness/tingling/weakness, bowel/bladder dysfunction. No recent head injuries. She felt she was coming down with the flu the week prior when she had  generalized weakness.   Her daughter reports a stroke in 2009 affecting her left arm, with no residual deficits. After her heart attack, she had another stroke with very brief right facial droop. She has been off her statin for a month due to muscle pains. She fell 2 months ago going down stairs and sat on her bottom, no loss of consciousness.   PAST MEDICAL HISTORY: Past Medical History  Diagnosis Date  . ANEMIA, PERNICIOUS 09/10/2006  . CORONARY ARTERY DISEASE 11/07/2008    a. NSTEMI 2001 & STEMI 2008 managed medically. b. Takotsubo 06/2006. c. Cath 2009 -> med rx.  . CVA WITH LEFT HEMIPARESIS 07/22/2007    May, 2009, neurology decided aspirin and Plavix at that time.  . DERMATITIS 03/02/2007  . HYPERTENSION 07/15/2006  . MENINGIOMA 08/27/2006    Occipital meningioma, surgery, [facility], 2008 /  MRI, [place], June, 2011, no change in lesion, history believe the patient had a gamma knife treatment of a right occipital atypical meningioma  . OSTEOARTHRITIS 07/15/2006  . PULMONARY NODULE 01/14/2007    [place], December, 2011  . Takotsubo syndrome 04/22/2008    MI, May, 2008, question takotsubo event  . THYROID NODULE 01/14/2007  . IBS (irritable bowel syndrome)   . DJD (degenerative joint disease)     L shoulder impingement  . Ejection fraction     EF 60%, echo, July, 2010, (  EF improved after tach a suitable event 2008)  . Subarachnoid hemorrhage following injury ([place]) 2007    Subarachnoid hemorrhage secondary to a fall December, 2007  . Bradycardia     August, 2013  . Family history of anesthesia complication     daughter has a hard time  waking up  . H/O hiatal hernia   . Abnormal CT of the chest     Stable patchy/nodular and ground-glass opacities in the right upper lobe, While grossly unchanged, low grade adenocarcinoma remains possible.  . Stroke [facility])     PAST SURGICAL HISTORY: Past Surgical History  Procedure Laterality Date  . Cholecystectomy    . Abdominal hysterectomy    .  Hip surgery    . Brain tumor excision      Meningioma; treated at [facility] in 2008  . Shoulder surgery    . Joint replacement      rt THR    MEDICATIONS: Current Outpatient Prescriptions on File Prior to Visit  Medication Sig Dispense Refill  . clopidogrel (PLAVIX) 75 MG tablet Take 1 tablet (75 mg total) by mouth daily with breakfast. 90 tablet 1  . cyanocobalamin (,VITAMIN B-12,) 1000 MCG/ML injection INJECT 1 ML (1000 MCG TOTAL) INTO THE MUSCLE  EVERY 30 DAYS 3 mL 3  . isosorbide mononitrate (IMDUR) 30 MG 24 hr tablet Take 0.5 tablets (15 mg total) by mouth daily. 90 tablet 1  . methimazole (TAPAZOLE) 5 MG tablet 1/2 tab daily (Patient taking differently: 5 mg. Takes on Mon Wed & Fri) 45 tablet 1  . triamcinolone ointment (KENALOG) 0.1 % APPLY 1 APPLICATION TOPICALLY TWICE A DAY FOR DRY SKIN (Patient taking differently: Apply 1 application topically 2 (two) times daily. FOR DRY SKIN) 80 g 1  . cyclobenzaprine (FLEXERIL) 10 MG tablet TAKE 1 TABLET THREE TIMES A DAY AS NEEDED FOR MUSCLE SPASMS (Patient not taking: Reported on 11/14/2014) 270 tablet 1  . HYDROcodone-acetaminophen (NORCO/VICODIN) 5-325 MG per tablet Take 1 tablet by mouth every 6 (six) hours as needed for moderate pain. (Patient not taking: Reported on 11/14/2014) 30 tablet 0  . meclizine (ANTIVERT) 25 MG tablet Take 1 tablet (25 mg total) by mouth 2 (two) times daily as needed for dizziness. (Patient not taking: Reported on 10/25/2014) 10 tablet 0  . nitroGLYCERIN (NITROSTAT) 0.4 MG SL tablet Place 1 tablet (0.4 mg total) under the tongue every 5 (five) minutes as needed. For chest pain. (Patient not taking: Reported on 11/14/2014) 15 tablet 3  . pravastatin (PRAVACHOL) 20 MG tablet Take 1 tablet (20 mg total) by mouth daily. Every evening (Patient not taking: Reported on 11/14/2014) 90 tablet 1  . PREMARIN vaginal cream Place 0.5 g vaginally as needed.     . [DISCONTINUED] gabapentin (NEURONTIN) 100 MG capsule Take 1 capsule  (100 mg total) by mouth at bedtime. (Patient not taking: Reported on 10/20/2014) 90 capsule 2   No current facility-administered medications on file prior to visit.    ALLERGIES: Allergies  Allergen Reactions  . Atorvastatin Other (See Comments)    Raises liver enzymes (takes pravastatin at home)  . Influenza Vaccines Other (See Comments)    Unknown  . Phenytoin Other (See Comments)    Blood clots  . Promethazine Hcl Other (See Comments)    Severe sedation  . Sulfamethoxazole     REACTION: unspecified  Per pt daughter she stated this causes rash    FAMILY HISTORY: Family History  Problem Relation Age of Onset  . Heart attack Brother 76    SOCIAL HISTORY: Social History   Social History  . Marital Status: Widowed    Spouse Name: N/A  . Number of Children: 4  . Years of Education: N/A   Occupational History  .     Social History Main Topics  . Smoking  status: Never Smoker   . Smokeless tobacco: Never Used  . Alcohol Use: No  . Drug Use: No  . Sexual Activity: No   Other Topics Concern  . Not on file   Social History Narrative   Widowed. Lives in [place], [place] with daughter.     REVIEW OF SYSTEMS: Constitutional: No fevers, chills, or sweats, no generalized fatigue, change in appetite Eyes: No visual changes, double vision, eye pain Ear, nose and throat: No hearing loss, ear pain, nasal congestion, sore throat Cardiovascular: No chest pain, palpitations Respiratory:  No shortness of breath at rest or with exertion, wheezes GastrointestinaI: No nausea, vomiting, diarrhea, abdominal pain, fecal incontinence Genitourinary:  No dysuria, urinary retention or frequency Musculoskeletal:  No neck pain, back pain Integumentary: No rash, pruritus, skin lesions Neurological: as above Psychiatric: No depression, insomnia, anxiety Endocrine: No palpitations, fatigue, diaphoresis, mood swings, change in appetite, change in weight, increased  thirst Hematologic/Lymphatic:  No anemia, purpura, petechiae. Allergic/Immunologic: no itchy/runny eyes, nasal congestion, recent allergic reactions, rashes  PHYSICAL EXAM: Filed Vitals:   11/14/14 1219  BP: 158/70  Pulse: 68  Resp: 16   General: No acute distress Head:  Normocephalic/atraumatic Eyes: Fundoscopic exam shows bilateral sharp discs, no vessel changes, exudates, or hemorrhages Neck: supple, no paraspinal tenderness, full range of motion Back: No paraspinal tenderness Heart: regular rate and rhythm Lungs: Clear to auscultation bilaterally. Vascular: No carotid bruits. Skin/Extremities: No rash, no edema Neurological Exam: Mental status: alert and oriented to person, place, and time, no dysarthria or aphasia, Fund of knowledge is appropriate.  Recent and remote memory are intact.  Attention and concentration are normal.    Able to name objects and repeat phrases. Cranial nerves: CN I: not tested CN II: pupils equal, round and reactive to light, visual fields intact, fundi unremarkable. CN III, IV, VI:  full range of motion, no nystagmus, no ptosis CN V: facial sensation intact CN VII: upper and lower face symmetric CN VIII: hearing intact to finger rub CN IX, X: gag intact, uvula midline CN XI: sternocleidomastoid and trapezius muscles intact CN XII: tongue midline Bulk & Tone: normal, no fasciculations. Motor: 5/5 throughout with no pronator drift. Sensation: decreased vibration sense to left ankle, otherwise intact to light touch, cold, pin, and joint position sense.  No extinction to double simultaneous stimulation.  Romberg test negative Deep Tendon Reflexes: +1 throughout, no ankle clonus Plantar responses: downgoing bilaterally Cerebellar: no incoordination on finger to nose testing Gait: slow and cautious, no ataxia. Tremor: none  IMPRESSION: This is a pleasant 81 year old right-handed woman with a history of right occipital atypical meningioma s/p gamma  knife treatment in 2011, strokes in 2009 and 2012 with no residual deficits, CAD, hypertension, who had an episode of vertigo last 10/20/14 that lasted for 2-3 days. MRI brain did not show any acute changes. Symptoms suggestive of vestibular neuritis, she denies any recurrence of symptoms in the past month. We discussed red flags for stroke, she still has meclizine and will take medication for recurrence of vertigo, but if symptoms continue despite medication or if there are other changes seen, go to ER immediately. She has a history of recurrent strokes and is currently on Plavix for secondary stroke prevention, continue control of vascular risk factors. She reports some generalized weakness and was encouraged to start an exercise program. She will follow-up on an as needed basis and knows to call our office for any changes.   Thank you for allowing me to  participate in the care of this patient. Please do not hesitate to call for any questions or concerns.   Ellouise Newer, M.D.  CC: Dr. Burnice Logan, Dr. Claudine Mouton

## 2014-11-15 DIAGNOSIS — Z8673 Personal history of transient ischemic attack (TIA), and cerebral infarction without residual deficits: Secondary | ICD-10-CM | POA: Insufficient documentation

## 2014-11-15 DIAGNOSIS — Z86018 Personal history of other benign neoplasm: Secondary | ICD-10-CM | POA: Insufficient documentation

## 2014-11-22 ENCOUNTER — Ambulatory Visit: Payer: Medicare Other | Admitting: Neurology

## 2014-12-19 ENCOUNTER — Encounter: Payer: Self-pay | Admitting: Endocrinology

## 2014-12-19 ENCOUNTER — Ambulatory Visit (INDEPENDENT_AMBULATORY_CARE_PROVIDER_SITE_OTHER): Payer: Medicare Other | Admitting: Endocrinology

## 2014-12-19 VITALS — BP 126/74 | HR 58 | Temp 97.5°F | Ht 64.0 in | Wt 123.0 lb

## 2014-12-19 DIAGNOSIS — E0590 Thyrotoxicosis, unspecified without thyrotoxic crisis or storm: Secondary | ICD-10-CM | POA: Diagnosis not present

## 2014-12-19 LAB — T4, FREE: FREE T4: 0.95 ng/dL (ref 0.60–1.60)

## 2014-12-19 LAB — TSH: TSH: 3.42 u[IU]/mL (ref 0.35–4.50)

## 2014-12-19 MED ORDER — METHIMAZOLE 5 MG PO TABS: ORAL_TABLET | ORAL | Status: DC

## 2014-12-19 NOTE — Patient Instructions (Signed)
blood tests are requested for you today.  We'll let you know about the results. if ever you have fever while taking methimazole, stop it and call us, because of the risk of a rare side-effect Please come back for a follow-up appointment in 4 months.

## 2014-12-19 NOTE — Progress Notes (Signed)
Subjective:    Patient ID: [name], female    DOB: [birth year], 81 y.o.   MRN: [id number]  HPI Pt returns for f/u of hyperthyroidism (dx'ed 2013; she was started on tapazole, as she can't be isolated due to frail elderly state; she was also noted to have a thyroid nodule, but w/u was felt not to be worthwhile, due to her advanced age and health problems).  She reports persistent generalized weakness.   Past Medical History  Diagnosis Date  . ANEMIA, PERNICIOUS 09/10/2006  . CORONARY ARTERY DISEASE 11/07/2008    a. NSTEMI 2001 & STEMI 2008 managed medically. b. Takotsubo 06/2006. c. Cath 2009 -> med rx.  . CVA WITH LEFT HEMIPARESIS 07/22/2007    May, 2009, neurology decided aspirin and Plavix at that time.  . DERMATITIS 03/02/2007  . HYPERTENSION 07/15/2006  . MENINGIOMA 08/27/2006    Occipital meningioma, surgery, [facility], 2008 /  MRI, [place], June, 2011, no change in lesion, history believe the patient had a gamma knife treatment of a right occipital atypical meningioma  . OSTEOARTHRITIS 07/15/2006  . PULMONARY NODULE 01/14/2007    [place], December, 2011  . Takotsubo syndrome 04/22/2008    MI, May, 2008, question takotsubo event  . THYROID NODULE 01/14/2007  . IBS (irritable bowel syndrome)   . DJD (degenerative joint disease)     L shoulder impingement  . Ejection fraction     EF 60%, echo, July, 2010, (  EF improved after tach a suitable event 2008)  . Subarachnoid hemorrhage following injury ([place]) 2007    Subarachnoid hemorrhage secondary to a fall December, 2007  . Bradycardia     August, 2013  . Family history of anesthesia complication     daughter has a hard time waking up  . H/O hiatal hernia   . Abnormal CT of the chest     Stable patchy/nodular and ground-glass opacities in the right upper lobe, While grossly unchanged, low grade adenocarcinoma remains possible.  . Stroke [facility])     Past Surgical History  Procedure Laterality Date  . Cholecystectomy    . Abdominal  hysterectomy    . Hip surgery    . Brain tumor excision      Meningioma; [facility] at [facility] in 2008  . Shoulder surgery    . Joint replacement      rt THR    Social History   Social History  . Marital Status: Widowed    Spouse Name: N/A  . Number of Children: 4  . Years of Education: N/A   Occupational History  .     Social History Main Topics  . Smoking status: Never Smoker   . Smokeless tobacco: Never Used  . Alcohol Use: No  . Drug Use: No  . Sexual Activity: No   Other Topics Concern  . Not on file   Social History Narrative   Widowed. Lives in [place], [place] with daughter.     Current Outpatient Prescriptions on File Prior to Visit  Medication Sig Dispense Refill  . clopidogrel (PLAVIX) 75 MG tablet Take 1 tablet (75 mg total) by mouth daily with breakfast. 90 tablet 1  . cyanocobalamin (,VITAMIN B-12,) 1000 MCG/ML injection INJECT 1 ML (1000 MCG TOTAL) INTO THE MUSCLE  EVERY 30 DAYS 3 mL 3  . cyclobenzaprine (FLEXERIL) 10 MG tablet TAKE 1 TABLET THREE TIMES A DAY AS NEEDED FOR MUSCLE SPASMS 270 tablet 1  . HYDROcodone-acetaminophen (NORCO/VICODIN) 5-325 MG per tablet Take 1 tablet by mouth  every 6 (six) hours as needed for moderate pain. 30 tablet 0  . isosorbide mononitrate (IMDUR) 30 MG 24 hr tablet Take 0.5 tablets (15 mg total) by mouth daily. 90 tablet 1  . meclizine (ANTIVERT) 25 MG tablet Take 1 tablet (25 mg total) by mouth 2 (two) times daily as needed for dizziness. 10 tablet 0  . nitroGLYCERIN (NITROSTAT) 0.4 MG SL tablet Place 1 tablet (0.4 mg total) under the tongue every 5 (five) minutes as needed. For chest pain. 15 tablet 3  . pravastatin (PRAVACHOL) 20 MG tablet Take 1 tablet (20 mg total) by mouth daily. Every evening 90 tablet 1  . PREMARIN vaginal cream Place 0.5 g vaginally as needed.     . triamcinolone ointment (KENALOG) 0.1 % APPLY 1 APPLICATION TOPICALLY TWICE A DAY FOR DRY SKIN (Patient taking differently: Apply 1 application topically 2  (two) times daily. FOR DRY SKIN) 80 g 1  . [DISCONTINUED] gabapentin (NEURONTIN) 100 MG capsule Take 1 capsule (100 mg total) by mouth at bedtime. (Patient not taking: Reported on 10/20/2014) 90 capsule 2   No current facility-administered medications on file prior to visit.    Allergies  Allergen Reactions  . Atorvastatin Other (See Comments)    Raises liver enzymes (takes pravastatin at home)  . Influenza Vaccines Other (See Comments)    Unknown  . Phenytoin Other (See Comments)    Blood clots  . Promethazine Hcl Other (See Comments)    Severe sedation  . Sulfamethoxazole     REACTION: unspecified  Per pt daughter she stated this causes rash    Family History  Problem Relation Age of Onset  . Heart attack Brother 84    BP 126/74 mmHg  Pulse 58  Temp(Src) 97.5 F (36.4 C) (Oral)  Ht 5\' 4"  (1.626 m)  Wt 123 lb (55.792 kg)  BMI 21.10 kg/m2  SpO2 97%  Review of Systems Denies fever.      Objective:   Physical Exam Vital signs: see vs page Gen: elderly, frail, no distress NECK: There is no palpable thyroid enlargement.  No thyroid nodule is palpable.  No palpable lymphadenopathy at the anterior neck.  Gait: slow but steady, with a cane.    Lab Results  Component Value Date   TSH 3.42 12/19/2014      Assessment & Plan:  Hyperthyroidism: well-controlled.    Patient is advised the following: Patient Instructions  blood tests are requested for you today.  We'll let you know about the results. if ever you have fever while taking methimazole, stop it and call us, because of the risk of a rare side-effect Please come back for a follow-up appointment in 4 months.   addendum: Please continue the same medication.

## 2015-01-05 ENCOUNTER — Other Ambulatory Visit: Payer: Self-pay | Admitting: Internal Medicine

## 2015-03-22 DIAGNOSIS — L603 Nail dystrophy: Secondary | ICD-10-CM | POA: Diagnosis not present

## 2015-03-22 DIAGNOSIS — L84 Corns and callosities: Secondary | ICD-10-CM | POA: Diagnosis not present

## 2015-03-22 DIAGNOSIS — I739 Peripheral vascular disease, unspecified: Secondary | ICD-10-CM | POA: Diagnosis not present

## 2015-04-05 ENCOUNTER — Encounter: Payer: Self-pay | Admitting: Internal Medicine

## 2015-04-05 ENCOUNTER — Ambulatory Visit (INDEPENDENT_AMBULATORY_CARE_PROVIDER_SITE_OTHER): Payer: Medicare Other | Admitting: Internal Medicine

## 2015-04-05 VITALS — BP 128/72 | HR 70 | Temp 97.9°F | Resp 18 | Ht 64.0 in | Wt 119.0 lb

## 2015-04-05 DIAGNOSIS — E0590 Thyrotoxicosis, unspecified without thyrotoxic crisis or storm: Secondary | ICD-10-CM

## 2015-04-05 DIAGNOSIS — Z8673 Personal history of transient ischemic attack (TIA), and cerebral infarction without residual deficits: Secondary | ICD-10-CM

## 2015-04-05 DIAGNOSIS — Z86018 Personal history of other benign neoplasm: Secondary | ICD-10-CM | POA: Diagnosis not present

## 2015-04-05 DIAGNOSIS — I10 Essential (primary) hypertension: Secondary | ICD-10-CM

## 2015-04-05 LAB — T4, FREE: Free T4: 0.93 ng/dL (ref 0.60–1.60)

## 2015-04-05 LAB — TSH: TSH: 1.76 u[IU]/mL (ref 0.35–4.50)

## 2015-04-05 NOTE — Patient Instructions (Signed)
Follow-up with Dr. Loanne Drilling next week as scheduled  Return in 6 months for follow-up

## 2015-04-05 NOTE — Progress Notes (Signed)
Subjective:    Patient ID: [name], female    DOB: 04/14/1922, 82 y.o.   MRN: [id number]  HPI   82 year old patient who has essential hypertension.  She has a history of cerebrovascular disease and coronary artery disease. She has had a prior stroke. Complaints today include some left anterior chest wall discomfort. She also describes some rather generalized pins and needles paresthesias. She has left lateral neck pain.  He complained of some mild headaches.  She remains on anti-thyroid medications for hyperthyroidism and she feels he does not tolerate this medication well she is scheduled for endocrine follow-up in 2 weeks.  She does have osteoarthritis  Past Medical History  Diagnosis Date  . ANEMIA, PERNICIOUS 09/10/2006  . CORONARY ARTERY DISEASE 11/07/2008    a. NSTEMI 2001 & STEMI 2008 managed medically. b. Takotsubo 06/2006. c. Cath 2009 -> med rx.  . CVA WITH LEFT HEMIPARESIS 07/22/2007    May, 2009, neurology decided aspirin and Plavix at that time.  . DERMATITIS 03/02/2007  . HYPERTENSION 07/15/2006  . MENINGIOMA 08/27/2006    Occipital meningioma, surgery, [facility], 2008 /  MRI, [place], June, 2011, no change in lesion, history believe the patient had a gamma knife treatment of a right occipital atypical meningioma  . OSTEOARTHRITIS 07/15/2006  . PULMONARY NODULE 01/14/2007    [place], December, 2011  . Takotsubo syndrome 04/22/2008    MI, May, 2008, question takotsubo event  . THYROID NODULE 01/14/2007  . IBS (irritable bowel syndrome)   . DJD (degenerative joint disease)     L shoulder impingement  . Ejection fraction     EF 60%, echo, July, 2010, (  EF improved after tach a suitable event 2008)  . Subarachnoid hemorrhage following injury ([place]) 2007    Subarachnoid hemorrhage secondary to a fall December, 2007  . Bradycardia     August, 2013  . Family history of anesthesia complication     daughter has a hard time waking up  . H/O hiatal hernia   . Abnormal CT of the  chest     Stable patchy/nodular and ground-glass opacities in the right upper lobe, While grossly unchanged, low grade adenocarcinoma remains possible.  . Stroke [facility])     Social History   Social History  . Marital Status: Widowed    Spouse Name: N/A  . Number of Children: 4  . Years of Education: N/A   Occupational History  .     Social History Main Topics  . Smoking status: Never Smoker   . Smokeless tobacco: Never Used  . Alcohol Use: No  . Drug Use: No  . Sexual Activity: No   Other Topics Concern  . Not on file   Social History Narrative   Widowed. Lives in [place], [place] with daughter.     Past Surgical History  Procedure Laterality Date  . Cholecystectomy    . Abdominal hysterectomy    . Hip surgery    . Brain tumor excision      Meningioma; treated at [facility] in 2008  . Shoulder surgery    . Joint replacement      rt THR    Family History  Problem Relation Age of Onset  . Heart attack Brother 84    Allergies  Allergen Reactions  . Atorvastatin Other (See Comments)    Raises liver enzymes (takes pravastatin at home)  . Influenza Vaccines Other (See Comments)    Unknown  . Phenytoin Other (See Comments)    Blood  clots  . Promethazine Hcl Other (See Comments)    Severe sedation  . Sulfamethoxazole     REACTION: unspecified  Per pt daughter she stated this causes rash    Current Outpatient Prescriptions on File Prior to Visit  Medication Sig Dispense Refill  . BD DISP NEEDLES 25G X 5/8" MISC USE AS DIRECTED ONCE A MONTH FOR B12 INJECTIONS 3 each 4  . clopidogrel (PLAVIX) 75 MG tablet TAKE 1 TABLET DAILY WITH BREAKFAST 90 tablet 1  . cyanocobalamin (,VITAMIN B-12,) 1000 MCG/ML injection INJECT 1 ML (1000 MCG TOTAL) INTO THE MUSCLE  EVERY 30 DAYS 3 mL 3  . cyclobenzaprine (FLEXERIL) 10 MG tablet TAKE 1 TABLET THREE TIMES A DAY AS NEEDED FOR MUSCLE SPASMS 270 tablet 1  . HYDROcodone-acetaminophen (NORCO/VICODIN) 5-325 MG per tablet Take 1 tablet  by mouth every 6 (six) hours as needed for moderate pain. 30 tablet 0  . isosorbide mononitrate (IMDUR) 30 MG 24 hr tablet Take 0.5 tablets (15 mg total) by mouth daily. 90 tablet 1  . meclizine (ANTIVERT) 25 MG tablet Take 1 tablet (25 mg total) by mouth 2 (two) times daily as needed for dizziness. 10 tablet 0  . methimazole (TAPAZOLE) 5 MG tablet 1 pill, 3 times a week: Mon Wed & Fri (Patient taking differently: Take 2.5 mg by mouth daily. ) 45 tablet 1  . nitroGLYCERIN (NITROSTAT) 0.4 MG SL tablet Place 1 tablet (0.4 mg total) under the tongue every 5 (five) minutes as needed. For chest pain. 15 tablet 3  . pravastatin (PRAVACHOL) 20 MG tablet TAKE 1 TABLET DAILY IN THE EVENING 90 tablet 1  . PREMARIN vaginal cream Place 0.5 g vaginally as needed.     Marland Kitchen PREMARIN vaginal cream USE 0.5 GRAM VAGINALLY TWICE A WEEK AS NEEDED 90 g 1  . triamcinolone ointment (KENALOG) 0.1 % APPLY 1 APPLICATION TOPICALLY TWICE A DAY FOR DRY SKIN (Patient taking differently: Apply 1 application topically 2 (two) times daily. FOR DRY SKIN) 80 g 1  . [DISCONTINUED] gabapentin (NEURONTIN) 100 MG capsule Take 1 capsule (100 mg total) by mouth at bedtime. (Patient not taking: Reported on 10/20/2014) 90 capsule 2   No current facility-administered medications on file prior to visit.    BP 128/72 mmHg  Pulse 70  Temp(Src) 97.9 F (36.6 C) (Oral)  Resp 18  Ht 5\' 4"  (1.626 m)  Wt 119 lb (53.978 kg)  BMI 20.42 kg/m2  SpO2 98%     Review of Systems  Constitutional: Positive for fatigue.  HENT: Negative for congestion, dental problem, hearing loss, rhinorrhea, sinus pressure, sore throat and tinnitus.   Eyes: Negative for pain, discharge and visual disturbance.  Respiratory: Negative for cough and shortness of breath.   Cardiovascular: Positive for chest pain. Negative for palpitations and leg swelling.  Gastrointestinal: Positive for abdominal pain. Negative for nausea, vomiting, diarrhea, constipation, blood in  stool and abdominal distention.  Genitourinary: Negative for dysuria, urgency, frequency, hematuria, flank pain, vaginal bleeding, vaginal discharge, difficulty urinating, vaginal pain and pelvic pain.  Musculoskeletal: Negative for joint swelling, arthralgias and gait problem.  Skin: Negative for rash.  Neurological: Positive for weakness and numbness. Negative for dizziness, syncope, speech difficulty and headaches.  Hematological: Negative for adenopathy.  Psychiatric/Behavioral: Negative for behavioral problems, dysphoric mood and agitation. The patient is not nervous/anxious.        Objective:   Physical Exam  Constitutional: She is oriented to person, place, and time. She appears well-developed and well-nourished.  Blood  pressure well controlled Clinically looks great  HENT:  Head: Normocephalic.  Right Ear: External ear normal.  Left Ear: External ear normal.  Mouth/Throat: Oropharynx is clear and moist.  Eyes: Conjunctivae and EOM are normal. Pupils are equal, round, and reactive to light.  Neck: Normal range of motion. Neck supple. No thyromegaly present.  Cardiovascular: Normal rate, regular rhythm, normal heart sounds and intact distal pulses.   Pulmonary/Chest: Effort normal and breath sounds normal. No respiratory distress. She has no wheezes. She has no rales. She exhibits no tenderness.  Abdominal: Soft. Bowel sounds are normal. She exhibits no mass. There is no tenderness.  Musculoskeletal: Normal range of motion.  Lymphadenopathy:    She has no cervical adenopathy.  Neurological: She is alert and oriented to person, place, and time.  Skin: Skin is warm and dry. No rash noted.  Psychiatric: She has a normal mood and affect. Her behavior is normal.          Assessment & Plan:   Hyperthyroidism.  We'll check free T4 and TSH today.  Follow-up endocrine in 2 weeks.  Consider taper and discontinuation of Tapazole Essential hypertension, stable Cerebrovascular  disease, stable Osteoarthritis History of B12 deficiency  No change in medical regimen Medicines updated  Recheck 6 months or as needed

## 2015-04-05 NOTE — Progress Notes (Signed)
Pre visit review using our clinic review tool, if applicable. No additional management support is needed unless otherwise documented below in the visit note. 

## 2015-04-17 NOTE — Progress Notes (Signed)
Subjective:    Patient ID: [name], female    DOB: 1922-10-12, 82 y.o.   MRN: [id number]  HPI Pt returns for f/u of hyperthyroidism (dx'ed 2013; she was started on tapazole, as she can't be isolated due to frail elderly state; she was also noted to have a thyroid nodule, but w/u was felt not to be worthwhile, due to her advanced age and health problems).  No change in chronic sxs of generalized weakness and tremor.  Past Medical History  Diagnosis Date  . ANEMIA, PERNICIOUS 09/10/2006  . CORONARY ARTERY DISEASE 11/07/2008    a. NSTEMI 2001 & STEMI 2008 managed medically. b. Takotsubo 06/2006. c. Cath 2009 -> med rx.  . CVA WITH LEFT HEMIPARESIS 07/22/2007    May, 2009, neurology decided aspirin and Plavix at that time.  . DERMATITIS 03/02/2007  . HYPERTENSION 07/15/2006  . MENINGIOMA 08/27/2006    Occipital meningioma, surgery, [facility], 2008 /  MRI, [place], June, 2011, no change in lesion, history believe the patient had a gamma knife treatment of a right occipital atypical meningioma  . OSTEOARTHRITIS 07/15/2006  . PULMONARY NODULE 01/14/2007    [place], December, 2011  . Takotsubo syndrome 04/22/2008    MI, May, 2008, question takotsubo event  . THYROID NODULE 01/14/2007  . IBS (irritable bowel syndrome)   . DJD (degenerative joint disease)     L shoulder impingement  . Ejection fraction     EF 60%, echo, July, 2010, (  EF improved after tach a suitable event 2008)  . Subarachnoid hemorrhage following injury ([place]) 2007    Subarachnoid hemorrhage secondary to a fall December, 2007  . Bradycardia     August, 2013  . Family history of anesthesia complication     daughter has a hard time waking up  . H/O hiatal hernia   . Abnormal CT of the chest     Stable patchy/nodular and ground-glass opacities in the right upper lobe, While grossly unchanged, low grade adenocarcinoma remains possible.  . Stroke [facility])     Past Surgical History  Procedure Laterality Date  . Cholecystectomy     . Abdominal hysterectomy    . Hip surgery    . Brain tumor excision      Meningioma; treated at [facility] in 2008  . Shoulder surgery    . Joint replacement      rt THR    Social History   Social History  . Marital Status: Widowed    Spouse Name: N/A  . Number of Children: 4  . Years of Education: N/A   Occupational History  .     Social History Main Topics  . Smoking status: Never Smoker   . Smokeless tobacco: Never Used  . Alcohol Use: No  . Drug Use: No  . Sexual Activity: No   Other Topics Concern  . Not on file   Social History Narrative   Widowed. Lives in [place], [place] with daughter.     Current Outpatient Prescriptions on File Prior to Visit  Medication Sig Dispense Refill  . BD DISP NEEDLES 25G X 5/8" MISC USE AS DIRECTED ONCE A MONTH FOR B12 INJECTIONS 3 each 4  . clopidogrel (PLAVIX) 75 MG tablet TAKE 1 TABLET DAILY WITH BREAKFAST 90 tablet 1  . cyanocobalamin (,VITAMIN B-12,) 1000 MCG/ML injection INJECT 1 ML (1000 MCG TOTAL) INTO THE MUSCLE  EVERY 30 DAYS 3 mL 3  . cyclobenzaprine (FLEXERIL) 10 MG tablet TAKE 1 TABLET THREE TIMES A DAY AS  NEEDED FOR MUSCLE SPASMS 270 tablet 1  . HYDROcodone-acetaminophen (NORCO/VICODIN) 5-325 MG per tablet Take 1 tablet by mouth every 6 (six) hours as needed for moderate pain. 30 tablet 0  . isosorbide mononitrate (IMDUR) 30 MG 24 hr tablet Take 0.5 tablets (15 mg total) by mouth daily. 90 tablet 1  . meclizine (ANTIVERT) 25 MG tablet Take 1 tablet (25 mg total) by mouth 2 (two) times daily as needed for dizziness. 10 tablet 0  . methimazole (TAPAZOLE) 5 MG tablet 1 pill, 3 times a week: Mon Wed & Fri (Patient taking differently: Take 2.5 mg by mouth daily. ) 45 tablet 1  . nitroGLYCERIN (NITROSTAT) 0.4 MG SL tablet Place 1 tablet (0.4 mg total) under the tongue every 5 (five) minutes as needed. For chest pain. 15 tablet 3  . pravastatin (PRAVACHOL) 20 MG tablet TAKE 1 TABLET DAILY IN THE EVENING 90 tablet 1  . PREMARIN  vaginal cream USE 0.5 GRAM VAGINALLY TWICE A WEEK AS NEEDED 90 g 1  . triamcinolone ointment (KENALOG) 0.1 % APPLY 1 APPLICATION TOPICALLY TWICE A DAY FOR DRY SKIN (Patient taking differently: Apply 1 application topically 2 (two) times daily. FOR DRY SKIN) 80 g 1  . [DISCONTINUED] gabapentin (NEURONTIN) 100 MG capsule Take 1 capsule (100 mg total) by mouth at bedtime. (Patient not taking: Reported on 10/20/2014) 90 capsule 2   No current facility-administered medications on file prior to visit.    Allergies  Allergen Reactions  . Atorvastatin Other (See Comments)    Raises liver enzymes (takes pravastatin at home)  . Influenza Vaccines Other (See Comments)    Unknown  . Phenytoin Other (See Comments)    Blood clots  . Promethazine Hcl Other (See Comments)    Severe sedation  . Sulfamethoxazole     REACTION: unspecified  Per pt daughter she stated this causes rash    Family History  Problem Relation Age of Onset  . Heart attack Brother 84    BP 120/70 mmHg  Pulse 69  Temp(Src) 97.5 F (36.4 C) (Oral)  Resp 18  Ht 5\' 4"  (1.626 m)  Wt 119 lb 8 oz (54.205 kg)  BMI 20.50 kg/m2  SpO2 93%  Review of Systems Denies fever.      Objective:   Physical Exam Vital signs: see vs page.   Gen: elderly, frail, no distress.  NECK: There is no palpable thyroid enlargement overall, but the left thyroid nodule is palpable again.  No palpable lymphadenopathy at the anterior neck.  Gait: slow but steady, with a cane.     Lab Results  Component Value Date   TSH 1.76 04/05/2015      Assessment & Plan:  Hyperthyroidism: well-controlled Thyroid nodule: no w/u is indicated at this advanced age, and in the setting of hyperthyroidism.   Patient is advised the following: Patient Instructions  Please continue the same medication. if ever you have fever while taking methimazole, stop it and call us, because of the risk of a rare side-effect.  Please come back for a follow-up appointment  in 6 months.

## 2015-04-18 ENCOUNTER — Encounter: Payer: Self-pay | Admitting: Endocrinology

## 2015-04-18 ENCOUNTER — Ambulatory Visit (INDEPENDENT_AMBULATORY_CARE_PROVIDER_SITE_OTHER): Payer: Medicare Other | Admitting: Endocrinology

## 2015-04-18 VITALS — BP 120/70 | HR 69 | Temp 97.5°F | Resp 18 | Ht 64.0 in | Wt 119.5 lb

## 2015-04-18 DIAGNOSIS — E0590 Thyrotoxicosis, unspecified without thyrotoxic crisis or storm: Secondary | ICD-10-CM

## 2015-04-18 NOTE — Progress Notes (Signed)
Pre visit review using our clinic review tool, if applicable. No additional management support is needed unless otherwise documented below in the visit note. 

## 2015-04-18 NOTE — Patient Instructions (Signed)
Please continue the same medication. if ever you have fever while taking methimazole, stop it and call us, because of the risk of a rare side-effect.  Please come back for a follow-up appointment in 6 months.

## 2015-04-24 ENCOUNTER — Ambulatory Visit: Payer: Medicare Other | Admitting: Internal Medicine

## 2015-05-06 ENCOUNTER — Other Ambulatory Visit: Payer: Self-pay | Admitting: Internal Medicine

## 2015-05-31 DIAGNOSIS — I739 Peripheral vascular disease, unspecified: Secondary | ICD-10-CM | POA: Diagnosis not present

## 2015-05-31 DIAGNOSIS — L603 Nail dystrophy: Secondary | ICD-10-CM | POA: Diagnosis not present

## 2015-05-31 DIAGNOSIS — L84 Corns and callosities: Secondary | ICD-10-CM | POA: Diagnosis not present

## 2015-06-05 ENCOUNTER — Telehealth: Payer: Self-pay | Admitting: Internal Medicine

## 2015-06-05 ENCOUNTER — Other Ambulatory Visit: Payer: Self-pay | Admitting: Internal Medicine

## 2015-06-05 NOTE — Telephone Encounter (Signed)
Daughter needs a letter from Dr Raliegh Ip stating pt needs a mailbox on her house so that she does not   have to walk out to the road for her mail.  [employer] needs a statement from her dr. Please call Mrs Alfonso Ramus when ready for pick up.

## 2015-06-07 ENCOUNTER — Encounter: Payer: Self-pay | Admitting: Cardiology

## 2015-06-07 DIAGNOSIS — I119 Hypertensive heart disease without heart failure: Secondary | ICD-10-CM | POA: Diagnosis not present

## 2015-06-07 DIAGNOSIS — I69352 Hemiplegia and hemiparesis following cerebral infarction affecting left dominant side: Secondary | ICD-10-CM | POA: Diagnosis not present

## 2015-06-07 DIAGNOSIS — E0590 Thyrotoxicosis, unspecified without thyrotoxic crisis or storm: Secondary | ICD-10-CM | POA: Diagnosis not present

## 2015-06-07 DIAGNOSIS — Z8673 Personal history of transient ischemic attack (TIA), and cerebral infarction without residual deficits: Secondary | ICD-10-CM | POA: Diagnosis not present

## 2015-06-07 DIAGNOSIS — E785 Hyperlipidemia, unspecified: Secondary | ICD-10-CM | POA: Diagnosis not present

## 2015-06-07 DIAGNOSIS — R42 Dizziness and giddiness: Secondary | ICD-10-CM | POA: Diagnosis not present

## 2015-06-07 DIAGNOSIS — I2510 Atherosclerotic heart disease of native coronary artery without angina pectoris: Secondary | ICD-10-CM | POA: Diagnosis not present

## 2015-06-07 NOTE — Progress Notes (Signed)
Patient ID: [name], female   DOB: 10-13-1922, 82 y.o.   MRN: [id number]  Sarah, Boyer    Date of visit:  06/07/2015 DOB:  05/29/1922    Age:  68 yrs. Medical record number:  [id number]     Account number:  [id number] Primary Care Provider: Bluford Kaufmann F ____________________________ CURRENT DIAGNOSES  1. Dizziness and giddiness  2. CAD Native without angina  3. Hypertensive heart disease without heart failure  4. Hyperthyrodism  5. Hyperlipidemia  6. Personal history of transient ischemic attack (TIA), and cerebral infarction without residual deficits ____________________________ ALLERGIES  Dilantin, Thrombosis  Influenza Virus Vaccines, Stomach ache  Lipitor, Liver enzymes abnormal  Phenergan, Fatigue  Sulfa (Sulfonamide Antibiotics), Rash, itching ____________________________ MEDICATIONS  1. methimazole 5 mg tablet, M, W, F  2. clopidogrel 75 mg tablet, 1 p.o. daily  3. Pravachol 20 mg tablet, QHS  4. isosorbide dinitrate 30 mg tablet, half tablet PO QD  5. Premarin 0.625 mg/gram vaginal cream, PRN  6. triamcinolone acetonide 0.1 % topical ointment, PRN  7. cyanocobalamin (vit B-12) 1,000 mcg/mL injection solution, inject once monthly  8. cyclobenzaprine 10 mg tablet, PRN  9. hydrocodone 5 mg-acetaminophen 325 mg tablet, PRN  10. meclizine 25 mg tablet, PRN  11. nitroglycerin 0.4 mg sublingual tablet, Take as directed  12. carvedilol 3.125 mg tablet, Take as directed ____________________________ HISTORY OF PRESENT ILLNESS This very nice 82 year old female is seen for evaluation of dizziness, vague chest pain and to establish for cardiac care. The patient is a concentration Therapist, occupational.  She came to [place] in the 1950s. She has a history of some hypertension and previously had a subarachnoid hemorrhage due to a fall and later had a meningioma that was resected by gamma knife procedure. She has a history of a stroke that resulted in some left hemiparesis  in the past. Her cardiac history is remarkable for mild coronary artery disease hypertension and hyperlipidemia. She has had intermittent chest pain over the years and had the catheterization in early 2001 and another was in 2009 that showed only mild coronary artery disease. One of the presentation was consistent with stress-induced cardiomyopathy but evidently resolved. She had a chest pain admission in 2015 and had a myocardial perfusion scan showed a fixed defect to the ejection fraction cannot be calculated. Echo ejection fraction showed an EF of around 55%. She still lives alone at home and has 4 small dogs. She has been a widow for about 5 years. She will intermittently use nitroglycerin for chest pain but has not seen a cardiologist in about 2 years. Her daughter brought her in complaining of some episodic lightheadedness or dizziness when she stands. She will usually take meclizine for this. She has not had syncope. In the past she has had elevations of blood pressure that she would take Tenormin and carvedilol for but does not take carvedilol on a regular basis. I cannot get a definite history of exertional angina. Her ambulation has become more limited recently and they have recently appeal to get a mailbox put on her house. She complains mainly of weakness and has significant low back pain as well as some urinary issues. She has varicose veins of her lower extremities. Not much in the way of PND, orthopnea and has never had definite syncope. Her daughter notes that she needs a prescription for nitroglycerin as well as carvedilol. ____________________________ PAST HISTORY  Past Medical Illnesses:  hypertension, GERD, history of meningioma, anemia-pernicious, history of CVA, history of  subarachnoid hemmorhage, history of hyperthyroidsim treated with tapazole Loanne Drilling), hyperlipidemia;  Cardiovascular Illnesses:  CAD, history of possible Tatkasubo syndrom 2008;  Infectious Diseases:  no previous history  of significant infectious diseases;  Surgical Procedures:  resection of meningioma, hip replacement-rt, hysterectomy, cholecystectomy, shoulder surgery;  Trauma History:  no previous history of significant trauma;  NYHA Classification:  I;  Cardiology Procedures-Invasive:  cardiac cath (left) November 2009;  Cardiology Procedures-Noninvasive:  echocardiogram July 2015;  Cardiac Cath Results:  normal Left main, 30% stenosis LAD, 40% stenosis Ramus, 30% stenosis proximal CFX, 30% stenosis proximal RCA;  Peripheral Vascular Procedures:  no previous invasive peripheral vascular procedures.;  LVEF of 55% documented via echocardiogram on 08/15/2013,   ____________________________ CARDIO-PULMONARY TEST DATES EKG Date:  06/07/2015;   Cardiac Cath Date:  12/10/2007;  Nuclear Study Date:  08/14/2013;   ____________________________ SOCIAL HISTORY Alcohol Use:  does not use alcohol;  Smoking:  nonsmoker;  Diet:  regular diet;  Lifestyle:  widowed;  Exercise:  no regular exercise;  Residence:  lives with daughter;   ____________________________ REVIEW OF SYSTEMS General:  denies recent weight change, fatique or change in exercise tolerance.  Integumentary:no rashes or new skin lesions. Eyes: cataract extraction bilaterally Ears, Nose, Throat, Mouth:  partial hearing loss, hearing aides Respiratory: denies dyspnea, cough, wheezing or hemoptysis. Cardiovascular:  please review HPI Abdominal: occasional dyspepsiaGenitourinary-Female: frequency, hesitancy Musculoskeletal:  chronic low back pain Neurological:  dizziness, unsteady gait Psychiatric:  denies depression or anxiety  ____________________________ PHYSICAL EXAMINATION VITAL SIGNS  Blood Pressure:  120/70 Sitting, Right arm, regular cuff  , 110/64 Standing, Right arm and regular cuff   Pulse:  80/min. Weight:  119.00 lbs. Height:  63"BMI: 21  Constitutional:  frail, elderly, pleasant white female, in no acute distress walks with cane Skin:  warm and dry to  touch, no apparent skin lesions, or masses noted. Head:  normocephalic, normal hair pattern, no masses or tenderness Eyes:  EOMS Intact, PERRLA, C and S clear, Funduscopic exam not done. ENT:  ears, nose and throat reveal no gross abnormalities.  Dentition good. Neck:  supple, without massess. No JVD, thyromegaly or carotid bruits. Carotid upstroke normal. Chest:  kyphotic, clear to auscultation Cardiac:  regular rhythm, normal S1 and S2, No S3 or S4, no murmurs, gallops or rubs detected. Abdomen:  abdomen soft,non-tender, no masses, no hepatospenomegaly, or aneurysm noted Peripheral Pulses:  the femoral,dorsalis pedis, and posterior tibial pulses are full and equal bilaterally with no bruits auscultated. Extremities & Back:  no edema present, bilateral lower extremity venous varicosities Neurological:  somewhat unsteady gait noted,  affect appropriate, oriented x3. ____________________________ IMPRESSIONS/PLAN  1. Episodic dizziness was somewhat low blood pressure currently 2. Mild coronary artery disease previously at catheterization 3. Hypertension controlled 4. Hyperlipidemia currently under treatment 5. Prior history of stroke with left repair cyst which has largely resolved 6. History of subarachnoid due to trauma 7. Varicose veins  Recommendations:  Extensive old records reviewed. Her EKG is normal except for first degree AV block. I recommended that she use nitroglycerin as needed for pain. She is somewhat feeble and underweight and her daughter checks on her frequently. I gave a p.r.n. prescription to use for carvedilol if it is a large elevation of blood pressure. No other workup at this time. I suspect her weakness is multifactorial at this time.  ____________________________ TODAYS ORDERS  1. 12 Lead EKG: Today  2. Return Visit: 6 months  3. 12 Lead EKG: 6 months  ____________________________ Cardiology Physician:  Kerry Hough MD  [facility]

## 2015-06-07 NOTE — Telephone Encounter (Signed)
Spoke to [place] and told her letter is ready for pickup will be at the front desk. [name] verbalized understanding and will come by today to pickup.

## 2015-07-12 ENCOUNTER — Other Ambulatory Visit: Payer: Self-pay | Admitting: Internal Medicine

## 2015-08-08 ENCOUNTER — Other Ambulatory Visit: Payer: Self-pay | Admitting: Internal Medicine

## 2015-08-09 ENCOUNTER — Telehealth: Payer: Self-pay | Admitting: Endocrinology

## 2015-08-09 MED ORDER — METHIMAZOLE 5 MG PO TABS: ORAL_TABLET | ORAL | Status: DC

## 2015-08-09 NOTE — Telephone Encounter (Signed)
Rx submitted

## 2015-08-09 NOTE — Telephone Encounter (Signed)
PT daughter called and said that PT mail order is not coming in soon enough and needs the Methimazole refill sent to [facility]

## 2015-08-09 NOTE — Telephone Encounter (Signed)
W [employer]. For 10 days worth.

## 2015-08-28 DIAGNOSIS — L84 Corns and callosities: Secondary | ICD-10-CM | POA: Diagnosis not present

## 2015-08-28 DIAGNOSIS — L603 Nail dystrophy: Secondary | ICD-10-CM | POA: Diagnosis not present

## 2015-08-28 DIAGNOSIS — I739 Peripheral vascular disease, unspecified: Secondary | ICD-10-CM | POA: Diagnosis not present

## 2015-10-12 ENCOUNTER — Other Ambulatory Visit: Payer: Self-pay | Admitting: Internal Medicine

## 2015-10-15 ENCOUNTER — Other Ambulatory Visit: Payer: Self-pay | Admitting: Endocrinology

## 2015-10-25 ENCOUNTER — Encounter: Payer: Self-pay | Admitting: Internal Medicine

## 2015-10-25 ENCOUNTER — Ambulatory Visit (INDEPENDENT_AMBULATORY_CARE_PROVIDER_SITE_OTHER): Payer: Medicare Other | Admitting: Internal Medicine

## 2015-10-25 VITALS — BP 100/70 | HR 65 | Temp 97.8°F | Resp 16 | Ht 64.0 in | Wt 111.0 lb

## 2015-10-25 DIAGNOSIS — I69959 Hemiplegia and hemiparesis following unspecified cerebrovascular disease affecting unspecified side: Secondary | ICD-10-CM

## 2015-10-25 DIAGNOSIS — I10 Essential (primary) hypertension: Secondary | ICD-10-CM | POA: Diagnosis not present

## 2015-10-25 DIAGNOSIS — M159 Polyosteoarthritis, unspecified: Secondary | ICD-10-CM

## 2015-10-25 DIAGNOSIS — E0590 Thyrotoxicosis, unspecified without thyrotoxic crisis or storm: Secondary | ICD-10-CM

## 2015-10-25 DIAGNOSIS — M150 Primary generalized (osteo)arthritis: Secondary | ICD-10-CM | POA: Diagnosis not present

## 2015-10-25 DIAGNOSIS — M8949 Other hypertrophic osteoarthropathy, multiple sites: Secondary | ICD-10-CM

## 2015-10-25 DIAGNOSIS — E785 Hyperlipidemia, unspecified: Secondary | ICD-10-CM

## 2015-10-25 LAB — CBC WITH DIFFERENTIAL/PLATELET
BASOS PCT: 0.4 % (ref 0.0–3.0)
Basophils Absolute: 0 10*3/uL (ref 0.0–0.1)
EOS PCT: 2.3 % (ref 0.0–5.0)
Eosinophils Absolute: 0.2 10*3/uL (ref 0.0–0.7)
HEMATOCRIT: 35.6 % — AB (ref 36.0–46.0)
HEMOGLOBIN: 12.1 g/dL (ref 12.0–15.0)
Lymphocytes Relative: 17.6 % (ref 12.0–46.0)
Lymphs Abs: 1.4 10*3/uL (ref 0.7–4.0)
MCHC: 34.1 g/dL (ref 30.0–36.0)
MCV: 87.4 fl (ref 78.0–100.0)
MONO ABS: 0.9 10*3/uL (ref 0.1–1.0)
MONOS PCT: 10.9 % (ref 3.0–12.0)
Neutro Abs: 5.4 10*3/uL (ref 1.4–7.7)
Neutrophils Relative %: 68.8 % (ref 43.0–77.0)
Platelets: 221 10*3/uL (ref 150.0–400.0)
RBC: 4.08 Mil/uL (ref 3.87–5.11)
RDW: 15.4 % (ref 11.5–15.5)
WBC: 7.9 10*3/uL (ref 4.0–10.5)

## 2015-10-25 LAB — COMPREHENSIVE METABOLIC PANEL
ALBUMIN: 3.8 g/dL (ref 3.5–5.2)
ALT: 9 U/L (ref 0–35)
AST: 15 U/L (ref 0–37)
Alkaline Phosphatase: 84 U/L (ref 39–117)
BUN: 24 mg/dL — AB (ref 6–23)
CHLORIDE: 106 meq/L (ref 96–112)
CO2: 28 mEq/L (ref 19–32)
Calcium: 8.9 mg/dL (ref 8.4–10.5)
Creatinine, Ser: 0.92 mg/dL (ref 0.40–1.20)
GFR: 60.73 mL/min (ref >60.00)
Glucose, Bld: 94 mg/dL (ref 70–99)
POTASSIUM: 4 meq/L (ref 3.5–5.1)
SODIUM: 141 meq/L (ref 135–145)
Total Bilirubin: 0.4 mg/dL (ref 0.2–1.2)
Total Protein: 6.5 g/dL (ref 6.0–8.3)

## 2015-10-25 LAB — TSH: TSH: 1.69 u[IU]/mL (ref 0.35–4.50)

## 2015-10-25 LAB — T4, FREE: Free T4: 0.92 ng/dL (ref 0.60–1.60)

## 2015-10-25 MED ORDER — HYDROCODONE-ACETAMINOPHEN 5-325 MG PO TABS: 1.0000 | ORAL_TABLET | Freq: Four times a day (QID) | ORAL | 0 refills | Status: DC | PRN

## 2015-10-25 NOTE — Progress Notes (Signed)
Pre visit review using our clinic review tool, if applicable. No additional management support is needed unless otherwise documented below in the visit note. 

## 2015-10-25 NOTE — Patient Instructions (Signed)
Attempt to increase caloric intake and maintain your weight Consider in sure supplements  Increase activity level-please use walker Endocrine follow-up as scheduled  Return here in 6 months or as needed

## 2015-10-25 NOTE — Progress Notes (Signed)
Subjective:    Patient ID: [name], female    DOB: 1922/04/19, 82 y.o.   MRN: [id number]  HPI  82 year old patient who is seen for her biannual follow-up.  She is followed by endocrine for hyperthyroidism controlled with Tapazole.  She has coronary artery and cerebrovascular disease.  Remains on Plavix and pravastatin.  Denies any chest pain or focal neurological symptoms.  In general doing quite well. There has been some modest weight loss.  Past Medical History:  Diagnosis Date  . Abnormal CT of the chest    Stable patchy/nodular and ground-glass opacities in the right upper lobe, While grossly unchanged, low grade adenocarcinoma remains possible.  Marland Kitchen ANEMIA, PERNICIOUS 09/10/2006  . Bradycardia    August, 2013  . CORONARY ARTERY DISEASE 11/07/2008   a. NSTEMI 2001 & STEMI 2008 managed medically. b. Takotsubo 06/2006. c. Cath 2009 -> med rx.  . CVA WITH LEFT HEMIPARESIS 07/22/2007   May, 2009, neurology decided aspirin and Plavix at that time.  . DERMATITIS 03/02/2007  . DJD (degenerative joint disease)    L shoulder impingement  . Ejection fraction    EF 60%, echo, July, 2010, (  EF improved after tach a suitable event 2008)  . Family history of anesthesia complication    daughter has a hard time waking up  . H/O hiatal hernia   . HYPERTENSION 07/15/2006  . IBS (irritable bowel syndrome)   . MENINGIOMA 08/27/2006   Occipital meningioma, surgery, [facility], 2008 /  MRI, [place], June, 2011, no change in lesion, history believe the patient had a gamma knife treatment of a right occipital atypical meningioma  . OSTEOARTHRITIS 07/15/2006  . PULMONARY NODULE 01/14/2007   [place], December, 2011  . Stroke ([place])   . Subarachnoid hemorrhage following injury ([place]) 2007   Subarachnoid hemorrhage secondary to a fall December, 2007  . Takotsubo syndrome 04/22/2008   MI, May, 2008, question takotsubo event  . THYROID NODULE 01/14/2007     Social History   Social History  . Marital  status: Widowed    Spouse name: N/A  . Number of children: 4  . Years of education: N/A   Occupational History  .  Retired   Social History Main Topics  . Smoking status: Never Smoker  . Smokeless tobacco: Never Used  . Alcohol use No  . Drug use: No  . Sexual activity: No   Other Topics Concern  . Not on file   Social History Narrative   Widowed. Lives in [place], [place] with daughter.     Past Surgical History:  Procedure Laterality Date  . ABDOMINAL HYSTERECTOMY    . BRAIN TUMOR EXCISION     Meningioma; [facility] at [facility] in 2008  . CHOLECYSTECTOMY    . JOINT REPLACEMENT     rt THR  . SHOULDER SURGERY      Family History  Problem Relation Age of Onset  . Heart attack Brother 84    Allergies  Allergen Reactions  . Atorvastatin Other (See Comments)    Raises liver enzymes (takes pravastatin at home)  . Influenza Vaccines Other (See Comments)    Unknown  . Phenytoin Other (See Comments)    Blood clots  . Promethazine Hcl Other (See Comments)    Severe sedation  . Sulfamethoxazole     REACTION: unspecified  Per pt daughter she stated this causes rash    Current Outpatient Prescriptions on File Prior to Visit  Medication Sig Dispense Refill  . B-D SYRINGE LUER-LOK  3CC 3 ML MISC     . BD DISP NEEDLES 25G X 5/8" MISC USE AS DIRECTED ONCE A MONTH FOR B12 INJECTIONS 3 each 4  . clopidogrel (PLAVIX) 75 MG tablet TAKE 1 TABLET DAILY WITH BREAKFAST 90 tablet 1  . cyanocobalamin (,VITAMIN B-12,) 1000 MCG/ML injection INJECT 1 ML (1000 MCG TOTAL) INTRAMUSCULARLY EVERY 30 DAYS 3 mL 3  . cyclobenzaprine (FLEXERIL) 10 MG tablet TAKE 1 TABLET THREE TIMES A DAY AS NEEDED FOR MUSCLE SPASMS 270 tablet 1  . isosorbide mononitrate (IMDUR) 30 MG 24 hr tablet TAKE ONE-HALF (1/2) TABLET DAILY 90 tablet 3  . meclizine (ANTIVERT) 25 MG tablet Take 1 tablet (25 mg total) by mouth 2 (two) times daily as needed for dizziness. 10 tablet 0  . methimazole (TAPAZOLE) 5 MG tablet TAKE 1  TABLET THREE TIMES A WEEK, MONDAY, WEDNESDAY, AND FRIDAY 45 tablet 1  . nitroGLYCERIN (NITROSTAT) 0.4 MG SL tablet Place 1 tablet (0.4 mg total) under the tongue every 5 (five) minutes as needed. For chest pain. 15 tablet 3  . pravastatin (PRAVACHOL) 20 MG tablet TAKE 1 TABLET DAILY IN THE EVENING 90 tablet 1  . PREMARIN vaginal cream USE 0.5 GRAM VAGINALLY TWICE A WEEK AS NEEDED 90 g 1  . triamcinolone ointment (KENALOG) 0.1 % APPLY 1 APPLICATION TOPICALLY TWICE A DAY FOR DRY SKIN (Patient taking differently: Apply 1 application topically 2 (two) times daily. FOR DRY SKIN) 80 g 1  . [DISCONTINUED] gabapentin (NEURONTIN) 100 MG capsule Take 1 capsule (100 mg total) by mouth at bedtime. (Patient not taking: Reported on 10/20/2014) 90 capsule 2   No current facility-administered medications on file prior to visit.     BP 100/70 (BP Location: Left Arm, Patient Position: Sitting, Cuff Size: Normal)   Pulse 65   Temp 97.8 F (36.6 C) (Oral)   Resp 16   Ht 5\' 4"  (1.626 m)   Wt 111 lb (50.3 kg)   SpO2 98%   BMI 19.05 kg/m     Review of Systems  Constitutional: Positive for fatigue and unexpected weight change.  HENT: Negative for congestion, dental problem, hearing loss, rhinorrhea, sinus pressure, sore throat and tinnitus.   Eyes: Negative for pain, discharge and visual disturbance.  Respiratory: Negative for cough and shortness of breath.   Cardiovascular: Negative for chest pain, palpitations and leg swelling.  Gastrointestinal: Negative for abdominal distention, abdominal pain, blood in stool, constipation, diarrhea, nausea and vomiting.  Genitourinary: Negative for difficulty urinating, dysuria, flank pain, frequency, hematuria, pelvic pain, urgency, vaginal bleeding, vaginal discharge and vaginal pain.  Musculoskeletal: Negative for arthralgias, gait problem and joint swelling.  Skin: Negative for rash.  Neurological: Positive for weakness. Negative for dizziness, syncope, speech  difficulty, numbness and headaches.  Hematological: Negative for adenopathy.  Psychiatric/Behavioral: Negative for agitation, behavioral problems and dysphoric mood. The patient is not nervous/anxious.        Objective:   Physical Exam  Constitutional: She is oriented to person, place, and time. She appears well-developed and well-nourished.  Elderly and frail Alert Blood pressure low normal  HENT:  Head: Normocephalic.  Right Ear: External ear normal.  Left Ear: External ear normal.  Mouth/Throat: Oropharynx is clear and moist.  Eyes: Conjunctivae and EOM are normal. Pupils are equal, round, and reactive to light.  Neck: Normal range of motion. Neck supple. No thyromegaly present.  Cardiovascular: Normal rate, regular rhythm, normal heart sounds and intact distal pulses.   Pulmonary/Chest: Effort normal and breath sounds normal.  Abdominal: Soft. Bowel sounds are normal. She exhibits no mass. There is no tenderness.  Musculoskeletal: Normal range of motion.  Lymphadenopathy:    She has no cervical adenopathy.  Neurological: She is alert and oriented to person, place, and time.  Reflexes brisk  Skin: Skin is warm and dry. No rash noted.  Psychiatric: She has a normal mood and affect. Her behavior is normal.          Assessment & Plan:   Hyperthyroidism.  We'll check thyroid function studies.  Endocrine follow-up as scheduled Cerebrovascular disease, stable Vitamin B12 deficiency.  Continue monthly supplements Essential hypertension, well-controlled Osteoarthritis Weight loss   Check updated lab Endocrine follow-up as scheduled Follow-up 6 months.  Weight gain.  Encouraged.  Patient will consider supplements  Nyoka Cowden

## 2015-10-26 ENCOUNTER — Encounter: Payer: Self-pay | Admitting: Endocrinology

## 2015-10-26 ENCOUNTER — Ambulatory Visit (INDEPENDENT_AMBULATORY_CARE_PROVIDER_SITE_OTHER): Payer: Medicare Other | Admitting: Endocrinology

## 2015-10-26 VITALS — BP 118/68 | HR 63 | Ht 64.0 in | Wt 112.0 lb

## 2015-10-26 DIAGNOSIS — E0590 Thyrotoxicosis, unspecified without thyrotoxic crisis or storm: Secondary | ICD-10-CM | POA: Diagnosis not present

## 2015-10-26 NOTE — Patient Instructions (Addendum)
Please continue the same medication.   if ever you have fever while taking methimazole, stop it and call us, because of the risk of a rare side-effect.   Please come back for a follow-up appointment in 6 months.

## 2015-10-26 NOTE — Progress Notes (Signed)
Subjective:    Patient ID: [name], female    DOB: 1922-05-28, 82 y.o.   MRN: [id number]  HPI Pt returns for f/u of hyperthyroidism (dx'ed 2013; she was started on tapazole, as she can't be isolated due to frail elderly state; she was also noted to have a thyroid nodule, but w/u was felt not to be worthwhile, due to her advanced age and health problems).  She has lost 9 lbs since last ov.  She has generalized weakness.   Past Medical History:  Diagnosis Date  . Abnormal CT of the chest    Stable patchy/nodular and ground-glass opacities in the right upper lobe, While grossly unchanged, low grade adenocarcinoma remains possible.  Marland Kitchen ANEMIA, PERNICIOUS 09/10/2006  . Bradycardia    August, 2013  . CORONARY ARTERY DISEASE 11/07/2008   a. NSTEMI 2001 & STEMI 2008 managed medically. b. Takotsubo 06/2006. c. Cath 2009 -> med rx.  . CVA WITH LEFT HEMIPARESIS 07/22/2007   May, 2009, neurology decided aspirin and Plavix at that time.  . DERMATITIS 03/02/2007  . DJD (degenerative joint disease)    L shoulder impingement  . Ejection fraction    EF 60%, echo, July, 2010, (  EF improved after tach a suitable event 2008)  . Family history of anesthesia complication    daughter has a hard time waking up  . H/O hiatal hernia   . HYPERTENSION 07/15/2006  . IBS (irritable bowel syndrome)   . MENINGIOMA 08/27/2006   Occipital meningioma, surgery, [facility], 2008 /  MRI, [place], June, 2011, no change in lesion, history believe the patient had a gamma knife treatment of a right occipital atypical meningioma  . OSTEOARTHRITIS 07/15/2006  . PULMONARY NODULE 01/14/2007   [place], December, 2011  . Stroke ([place])   . Subarachnoid hemorrhage following injury ([place]) 2007   Subarachnoid hemorrhage secondary to a fall December, 2007  . Takotsubo syndrome 04/22/2008   MI, May, 2008, question takotsubo event  . THYROID NODULE 01/14/2007    Past Surgical History:  Procedure Laterality Date  . ABDOMINAL  HYSTERECTOMY    . BRAIN TUMOR EXCISION     Meningioma; [facility] at [facility] in [facility]  . CHOLECYSTECTOMY    . JOINT REPLACEMENT     rt THR  . SHOULDER SURGERY      Social History   Social History  . Marital status: Widowed    Spouse name: N/A  . Number of children: 4  . Years of education: N/A   Occupational History  .  Retired   Social History Main Topics  . Smoking status: Never Smoker  . Smokeless tobacco: Never Used  . Alcohol use No  . Drug use: No  . Sexual activity: No   Other Topics Concern  . Not on file   Social History Narrative   Widowed. Lives in [place], [place] with daughter.     Current Outpatient Prescriptions on File Prior to Visit  Medication Sig Dispense Refill  . B-D SYRINGE LUER-LOK 3CC 3 ML MISC     . BD DISP NEEDLES 25G X 5/8" MISC USE AS DIRECTED ONCE A MONTH FOR B12 INJECTIONS 3 each 4  . clopidogrel (PLAVIX) 75 MG tablet TAKE 1 TABLET DAILY WITH BREAKFAST 90 tablet 1  . cyanocobalamin (,VITAMIN B-12,) 1000 MCG/ML injection INJECT 1 ML (1000 MCG TOTAL) INTRAMUSCULARLY EVERY 30 DAYS 3 mL 3  . cyclobenzaprine (FLEXERIL) 10 MG tablet TAKE 1 TABLET THREE TIMES A DAY AS NEEDED FOR MUSCLE SPASMS 270 tablet 1  .  HYDROcodone-acetaminophen (NORCO/VICODIN) 5-325 MG tablet Take 1 tablet by mouth every 6 (six) hours as needed for moderate pain. 30 tablet 0  . isosorbide mononitrate (IMDUR) 30 MG 24 hr tablet TAKE ONE-HALF (1/2) TABLET DAILY 90 tablet 3  . meclizine (ANTIVERT) 25 MG tablet Take 1 tablet (25 mg total) by mouth 2 (two) times daily as needed for dizziness. 10 tablet 0  . methimazole (TAPAZOLE) 5 MG tablet TAKE 1 TABLET THREE TIMES A WEEK, MONDAY, WEDNESDAY, AND FRIDAY 45 tablet 1  . nitroGLYCERIN (NITROSTAT) 0.4 MG SL tablet Place 1 tablet (0.4 mg total) under the tongue every 5 (five) minutes as needed. For chest pain. 15 tablet 3  . pravastatin (PRAVACHOL) 20 MG tablet TAKE 1 TABLET DAILY IN THE EVENING 90 tablet 1  . PREMARIN vaginal cream USE  0.5 GRAM VAGINALLY TWICE A WEEK AS NEEDED 90 g 1  . triamcinolone ointment (KENALOG) 0.1 % APPLY 1 APPLICATION TOPICALLY TWICE A DAY FOR DRY SKIN (Patient taking differently: Apply 1 application topically 2 (two) times daily. FOR DRY SKIN) 80 g 1  . [DISCONTINUED] gabapentin (NEURONTIN) 100 MG capsule Take 1 capsule (100 mg total) by mouth at bedtime. (Patient not taking: Reported on 10/20/2014) 90 capsule 2   No current facility-administered medications on file prior to visit.     Allergies  Allergen Reactions  . Atorvastatin Other (See Comments)    Raises liver enzymes (takes pravastatin at home)  . Influenza Vaccines Other (See Comments)    Unknown  . Phenytoin Other (See Comments)    Blood clots  . Promethazine Hcl Other (See Comments)    Severe sedation  . Sulfamethoxazole     REACTION: unspecified  Per pt daughter she stated this causes rash    Family History  Problem Relation Age of Onset  . Heart attack Brother 84    BP 118/68   Pulse 63   Ht 5\' 4"  (1.626 m)   Wt 112 lb (50.8 kg)   SpO2 93%   BMI 19.22 kg/m    Review of Systems Denies fever.     Objective:   Physical Exam Vital signs: see vs page.   Gen: elderly, frail, no distress.  NECK: There is no palpable thyroid enlargement overall, but the left thyroid nodule is palpable again.  No palpable lymphadenopathy at the anterior neck.    Lab Results  Component Value Date   TSH 1.69 10/25/2015      Assessment & Plan:  Hyperthyroidism: well-controlled.

## 2015-11-16 ENCOUNTER — Encounter: Payer: Medicare Other | Attending: Endocrinology | Admitting: Dietician

## 2015-11-16 ENCOUNTER — Encounter: Payer: Self-pay | Admitting: Dietician

## 2015-11-16 ENCOUNTER — Other Ambulatory Visit: Payer: Self-pay | Admitting: Endocrinology

## 2015-11-16 DIAGNOSIS — R634 Abnormal weight loss: Secondary | ICD-10-CM | POA: Insufficient documentation

## 2015-11-16 NOTE — Patient Instructions (Addendum)
Choose whole milk yogurt, whole milk cottage cheese. Add butter, oil,  and sour cream, whipped cream to your foods to increase the calorie content. Make the oatmeal with whole milk (lactaid) rather than water and add raisins. Drink 3 high calorie Boost daily (add fruit and blend to change the flavor). Stay active

## 2015-11-16 NOTE — Progress Notes (Signed)
Medical Nutrition Therapy:  Appt start time: 1300 end time:  [id number].   Assessment:  Primary concerns today: Patient is here with [name].  She was referred due to weight loss.  Possible causes, hyperthyroidism, death of Boyer in 2022/08/30, inadequate intake.  Hx includes hyperthyroidism, CVA 2008, MI, and HTN.   She has lost from a UBW of 120-125 lbs six months ago to today's weight of 110 lbs.  Nutrition Focused Physical Exam preformed.  Surprisingly patient has fairly good muscle mass.  She is depleted in the temporal region.  She had a recent fall over a dog bed in Sarah home.  She walks with a cane.  She reports that she drinks 3 Boost per day but Boyer buys these and denies this. She states that the Boost makes Sarah constipated. [name] states that Sarah appetite is good.  Boyer is verbal about need to have someone in Sarah mom's home if she cannot maintain Sarah strength.  Sarah. Boyer does not want this. Patient reports increased weakness on the day she takes the thyroid medication.  Patient lives alone with 4 small dogs.  [name] lives about 1 1/2 miles away.  She is from [place] and is a concentration camp survivor.  She prefers to make Sarah own baked goods and other foods from scratch.  Some of which take extended amounts of time and will not eat many prepared products. She will eat out at times.  Preferred Learning Style:   Auditory  Visual  Hands on  No preference indicated   Learning Readiness:   Ready   MEDICATIONS: see list   DIETARY INTAKE:  Usual eating pattern includes 3 meals and 2-3 snacks per day.  Avoided foods include milk (lactose intolerant).  Dislikes eggs.  Chocolate causes migraines.  24-hr recall:  B (5 AM): sandwich with meat, butter, sometimes PB&J and instant oatmeal, hot tea with lactaid milk Snk ( AM): Boost  L (12 PM): homemade chicken noodle soup, bread and chicken OR sandwich OR hot dog with slaw and french fries or onion rings Snk ( PM):  fruit D (5 PM): occasional out to eat steak meal OR meat, rice, veges OR lasagne with veges or goulash OR oriental chicken and rice Snk ( PM): none or Boost Beverages: Boost, water, hot tea with lactaid milk and sugar, homemade chicken broth.  Usual physical activity: ADL's and some armchair exercises.  Estimated energy needs: 2000 calories 60 g protein  Progress Towards Goal(s):  In progress.   Nutritional Diagnosis:  [place]-3.2 Unintentional weight loss As related to hyperthyroidism, death of Boyer, inadequate intake.  As evidenced by loss of 12% of Sarah body weight in the past 6 months..    Intervention:  Nutrition counseling/education related to weight loss and importance of improved caloric intake to prevent further weight loss and maintain independent status.  Recommended continued Boost Plus and trial of one with fiber or additional fiber supplement to help with constipation.  Discussed alternatives to Boost but patient was not receptive.  Discussed use of high calorie baked goods from a Insurance underwriter but patient refused.  Discussed other ways to increase caloric intake.  Patient is to continue 3 meals and 2-3 snacks daily.  Discussed blending Boost with fruit to change flavor and increase nutrition density.  Choose whole milk yogurt, whole milk cottage cheese. Add butter, oil,  and sour cream, whipped cream to your foods to increase the calorie content. Make the oatmeal with whole milk (lactaid) rather than  water and add raisins. Drink 3 high calorie Boost daily (add fruit and blend to change the flavor). Stay active   Teaching Method Utilized:  Visual Auditory Hands on  Handouts given during visit include:  Tips to increase calorics from AND  Barriers to learning/adherence to lifestyle change: patient's will  Demonstrated degree of understanding via:  Teach Back   Monitoring/Evaluation:  Dietary intake, exercise, and body weight prn.

## 2015-11-29 DIAGNOSIS — I739 Peripheral vascular disease, unspecified: Secondary | ICD-10-CM | POA: Diagnosis not present

## 2015-11-29 DIAGNOSIS — L603 Nail dystrophy: Secondary | ICD-10-CM | POA: Diagnosis not present

## 2015-11-29 DIAGNOSIS — L84 Corns and callosities: Secondary | ICD-10-CM | POA: Diagnosis not present

## 2015-12-01 DIAGNOSIS — I2510 Atherosclerotic heart disease of native coronary artery without angina pectoris: Secondary | ICD-10-CM | POA: Diagnosis not present

## 2015-12-01 DIAGNOSIS — I119 Hypertensive heart disease without heart failure: Secondary | ICD-10-CM | POA: Diagnosis not present

## 2015-12-01 DIAGNOSIS — Z8673 Personal history of transient ischemic attack (TIA), and cerebral infarction without residual deficits: Secondary | ICD-10-CM | POA: Diagnosis not present

## 2015-12-01 DIAGNOSIS — E0590 Thyrotoxicosis, unspecified without thyrotoxic crisis or storm: Secondary | ICD-10-CM | POA: Diagnosis not present

## 2015-12-01 DIAGNOSIS — E785 Hyperlipidemia, unspecified: Secondary | ICD-10-CM | POA: Diagnosis not present

## 2015-12-01 DIAGNOSIS — R42 Dizziness and giddiness: Secondary | ICD-10-CM | POA: Diagnosis not present

## 2016-01-08 ENCOUNTER — Other Ambulatory Visit: Payer: Self-pay | Admitting: Internal Medicine

## 2016-02-23 ENCOUNTER — Other Ambulatory Visit: Payer: Self-pay | Admitting: Internal Medicine

## 2016-02-23 ENCOUNTER — Encounter: Payer: Self-pay | Admitting: Internal Medicine

## 2016-02-23 ENCOUNTER — Telehealth: Payer: Self-pay | Admitting: Internal Medicine

## 2016-02-23 ENCOUNTER — Ambulatory Visit (INDEPENDENT_AMBULATORY_CARE_PROVIDER_SITE_OTHER): Payer: Medicare Other | Admitting: Internal Medicine

## 2016-02-23 VITALS — BP 130/70 | HR 72 | Temp 97.5°F | Ht 65.0 in | Wt 111.2 lb

## 2016-02-23 DIAGNOSIS — N390 Urinary tract infection, site not specified: Secondary | ICD-10-CM | POA: Diagnosis not present

## 2016-02-23 DIAGNOSIS — E0590 Thyrotoxicosis, unspecified without thyrotoxic crisis or storm: Secondary | ICD-10-CM | POA: Diagnosis not present

## 2016-02-23 DIAGNOSIS — I10 Essential (primary) hypertension: Secondary | ICD-10-CM | POA: Diagnosis not present

## 2016-02-23 LAB — T4, FREE: FREE T4: 0.95 ng/dL (ref 0.60–1.60)

## 2016-02-23 LAB — TSH: TSH: 1.12 u[IU]/mL (ref 0.35–4.50)

## 2016-02-23 MED ORDER — NITROFURANTOIN MACROCRYSTAL 50 MG PO CAPS: 50.0000 mg | ORAL_CAPSULE | Freq: Three times a day (TID) | ORAL | 0 refills | Status: DC

## 2016-02-23 NOTE — Progress Notes (Signed)
Subjective:    Patient ID: [name], female    DOB: November 12, 1922, 82 y.o.   MRN: [id number]  HPI  83 year old patient who is seen today with a 2 day history of frequent urination and dysuria.  She describes some suprapubic discomfort. She has a history of essential hypertension. She has a history of hyperthyroidism and remains on Tapazole  She has coronary artery disease which has been stable.  Complains of a general sense of unwellness but no focal complaints other than UTI related  No recent lab  Past Medical History:  Diagnosis Date  . Abnormal CT of the chest    Stable patchy/nodular and ground-glass opacities in the right upper lobe, While grossly unchanged, low grade adenocarcinoma remains possible.  Marland Kitchen ANEMIA, PERNICIOUS 09/10/2006  . Bradycardia    August, 2013  . CORONARY ARTERY DISEASE 11/07/2008   a. NSTEMI 2001 & STEMI 2008 managed medically. b. Takotsubo 06/2006. c. Cath 2009 -> med rx.  . CVA WITH LEFT HEMIPARESIS 07/22/2007   May, 2009, neurology decided aspirin and Plavix at that time.  . DERMATITIS 03/02/2007  . DJD (degenerative joint disease)    L shoulder impingement  . Ejection fraction    EF 60%, echo, July, 2010, (  EF improved after tach a suitable event 2008)  . Family history of anesthesia complication    daughter has a hard time waking up  . H/O hiatal hernia   . HYPERTENSION 07/15/2006  . IBS (irritable bowel syndrome)   . MENINGIOMA 08/27/2006   Occipital meningioma, surgery, [facility], 2008 /  MRI, [place], June, 2011, no change in lesion, history believe the patient had a gamma knife treatment of a right occipital atypical meningioma  . OSTEOARTHRITIS 07/15/2006  . PULMONARY NODULE 01/14/2007   [facility], December, 2011  . Stroke ([place])   . Subarachnoid hemorrhage following injury ([place]) 2007   Subarachnoid hemorrhage secondary to a fall December, 2007  . Takotsubo syndrome 04/22/2008   MI, May, 2008, question takotsubo event  . THYROID NODULE  01/14/2007     Social History   Social History  . Marital status: Widowed    Spouse name: N/A  . Number of children: 4  . Years of education: N/A   Occupational History  .  Retired   Social History Main Topics  . Smoking status: Never Smoker  . Smokeless tobacco: Never Used  . Alcohol use No  . Drug use: No  . Sexual activity: No   Other Topics Concern  . Not on file   Social History Narrative   Widowed. Lives in [place], [place] with daughter.     Past Surgical History:  Procedure Laterality Date  . ABDOMINAL HYSTERECTOMY    . BRAIN TUMOR EXCISION     Meningioma; treated at [facility] in 2008  . CHOLECYSTECTOMY    . JOINT REPLACEMENT     rt THR  . SHOULDER SURGERY      Family History  Problem Relation Age of Onset  . Heart attack Brother 84    Allergies  Allergen Reactions  . Atorvastatin Other (See Comments)    Raises liver enzymes (takes pravastatin at home)  . Influenza Vaccines Other (See Comments)    Unknown  . Phenytoin Other (See Comments)    Blood clots  . Promethazine Hcl Other (See Comments)    Severe sedation  . Sulfamethoxazole     REACTION: unspecified  Per pt daughter she stated this causes rash    Current Outpatient Prescriptions on File  Prior to Visit  Medication Sig Dispense Refill  . B-D SYRINGE LUER-LOK 3CC 3 ML MISC     . BD DISP NEEDLES 25G X 5/8" MISC USE AS DIRECTED ONCE A MONTH FOR B12 INJECTIONS 3 each 4  . clopidogrel (PLAVIX) 75 MG tablet TAKE 1 TABLET DAILY WITH BREAKFAST 90 tablet 1  . cyanocobalamin (,VITAMIN B-12,) 1000 MCG/ML injection INJECT 1 ML (1000 MCG TOTAL) INTRAMUSCULARLY EVERY 30 DAYS 3 mL 3  . cyclobenzaprine (FLEXERIL) 10 MG tablet TAKE 1 TABLET THREE TIMES A DAY AS NEEDED FOR MUSCLE SPASMS 270 tablet 1  . HYDROcodone-acetaminophen (NORCO/VICODIN) 5-325 MG tablet Take 1 tablet by mouth every 6 (six) hours as needed for moderate pain. 30 tablet 0  . isosorbide mononitrate (IMDUR) 30 MG 24 hr tablet TAKE ONE-HALF  (1/2) TABLET DAILY 90 tablet 3  . meclizine (ANTIVERT) 25 MG tablet Take 1 tablet (25 mg total) by mouth 2 (two) times daily as needed for dizziness. 10 tablet 0  . methimazole (TAPAZOLE) 5 MG tablet TAKE 1 TABLET THREE TIMES A WEEK, MONDAY, WEDNESDAY, AND FRIDAY 45 tablet 1  . nitroGLYCERIN (NITROSTAT) 0.4 MG SL tablet Place 1 tablet (0.4 mg total) under the tongue every 5 (five) minutes as needed. For chest pain. 15 tablet 3  . pravastatin (PRAVACHOL) 20 MG tablet TAKE 1 TABLET DAILY IN THE EVENING 90 tablet 1  . PREMARIN vaginal cream USE 0.5 GRAM VAGINALLY TWICE A WEEK AS NEEDED 90 g 1  . triamcinolone ointment (KENALOG) 0.1 % APPLY 1 APPLICATION TOPICALLY TWICE A DAY FOR DRY SKIN (Patient taking differently: Apply 1 application topically 2 (two) times daily. FOR DRY SKIN) 80 g 1  . [DISCONTINUED] gabapentin (NEURONTIN) 100 MG capsule Take 1 capsule (100 mg total) by mouth at bedtime. (Patient not taking: Reported on 10/20/2014) 90 capsule 2   No current facility-administered medications on file prior to visit.     BP 130/70 (BP Location: Right Arm, Patient Position: Sitting, Cuff Size: Normal)   Pulse 72   Temp 97.5 F (36.4 C) (Oral)   Ht 5\' 5"  (1.651 m)   Wt 111 lb 3.2 oz (50.4 kg)   SpO2 98%   BMI 18.50 kg/m     Review of Systems  Constitutional: Positive for fatigue.  HENT: Negative for congestion, dental problem, hearing loss, rhinorrhea, sinus pressure, sore throat and tinnitus.   Eyes: Negative for pain, discharge and visual disturbance.  Respiratory: Negative for cough and shortness of breath.   Cardiovascular: Negative for chest pain, palpitations and leg swelling.  Gastrointestinal: Negative for abdominal distention, abdominal pain, blood in stool, constipation, diarrhea, nausea and vomiting.  Genitourinary: Positive for difficulty urinating, dysuria and frequency. Negative for flank pain, hematuria, pelvic pain, urgency, vaginal bleeding, vaginal discharge and vaginal  pain.  Musculoskeletal: Positive for arthralgias and back pain. Negative for gait problem and joint swelling.  Skin: Negative for rash.  Neurological: Positive for weakness. Negative for dizziness, syncope, speech difficulty, numbness and headaches.  Hematological: Negative for adenopathy.  Psychiatric/Behavioral: Negative for agitation, behavioral problems and dysphoric mood. The patient is not nervous/anxious.        Objective:   Physical Exam  Constitutional: She is oriented to person, place, and time. She appears well-developed and well-nourished.  Elderly, frail no acute distress.  Afebrile.  Blood pressure well controlled  HENT:  Head: Normocephalic.  Right Ear: External ear normal.  Left Ear: External ear normal.  Mouth/Throat: Oropharynx is clear and moist.  From sebaceous cyst noted  in the occipital scalp area approximately 2 cm  Eyes: Conjunctivae and EOM are normal. Pupils are equal, round, and reactive to light.  Neck: Normal range of motion. Neck supple. No thyromegaly present.  Cardiovascular: Normal rate, regular rhythm, normal heart sounds and intact distal pulses.   Pulmonary/Chest: Effort normal and breath sounds normal.  Abdominal: Soft. Bowel sounds are normal. She exhibits no mass. There is no tenderness.  Suprapubic tenderness to palpation  Musculoskeletal: Normal range of motion.  Lymphadenopathy:    She has no cervical adenopathy.  Neurological: She is alert and oriented to person, place, and time.  Skin: Skin is warm and dry. No rash noted.  Psychiatric: She has a normal mood and affect. Her behavior is normal.          Assessment & Plan:   Acute UTI.  Will treat with nitrofurantoin for 7 days Essential hypertension Hyperthyroidism.  Will review thyroid indices  Follow-up 3 months  Nyoka Cowden

## 2016-02-23 NOTE — Telephone Encounter (Signed)
[facility] Pt send to [facility] on [address]. Pt waas seen today

## 2016-02-23 NOTE — Telephone Encounter (Signed)
Re sent abx to [facility] on [address].

## 2016-02-23 NOTE — Progress Notes (Signed)
Pre visit review using our clinic review tool, if applicable. No additional management support is needed unless otherwise documented below in the visit note. 

## 2016-02-23 NOTE — Patient Instructions (Signed)
Drink as much fluid as you  can tolerate over the next few days  Take your antibiotic as prescribed until ALL of it is gone, but stop if you develop a rash, swelling, or any side effects of the medication.  Contact our office as soon as possible if  there are side effects of the medication.  Return in 6 months for follow-up  

## 2016-03-06 DIAGNOSIS — L603 Nail dystrophy: Secondary | ICD-10-CM | POA: Diagnosis not present

## 2016-03-06 DIAGNOSIS — I739 Peripheral vascular disease, unspecified: Secondary | ICD-10-CM | POA: Diagnosis not present

## 2016-03-06 DIAGNOSIS — L84 Corns and callosities: Secondary | ICD-10-CM | POA: Diagnosis not present

## 2016-03-27 ENCOUNTER — Ambulatory Visit (INDEPENDENT_AMBULATORY_CARE_PROVIDER_SITE_OTHER): Payer: Medicare Other | Admitting: Family Medicine

## 2016-03-27 VITALS — BP 110/60 | HR 58 | Temp 97.7°F | Ht 65.0 in | Wt 111.0 lb

## 2016-03-27 DIAGNOSIS — R05 Cough: Secondary | ICD-10-CM

## 2016-03-27 DIAGNOSIS — R059 Cough, unspecified: Secondary | ICD-10-CM

## 2016-03-27 MED ORDER — AZITHROMYCIN 250 MG PO TABS: ORAL_TABLET | ORAL | 0 refills | Status: AC

## 2016-03-27 NOTE — Patient Instructions (Signed)
Follow up for any fever or increased shortness of breath. 

## 2016-03-27 NOTE — Progress Notes (Signed)
Pre visit review using our clinic review tool, if applicable. No additional management support is needed unless otherwise documented below in the visit note. 

## 2016-03-27 NOTE — Progress Notes (Signed)
Subjective:     Patient ID: [name], female   DOB: 09/25/1922, 83 y.o.   MRN: [id number]  HPI Patient is an elderly 83 year old who seen with onset last Friday of some nasal congestion and cough. Over the past couple days daughter states she has developed some chills but no documented fever. Never smoked. She still eating and drinking okay. Cough has been productive of thick yellow sputum. No nausea or vomiting.  No confusion.  Denies any dyspnea at rest.  Past Medical History:  Diagnosis Date  . Abnormal CT of the chest    Stable patchy/nodular and ground-glass opacities in the right upper lobe, While grossly unchanged, low grade adenocarcinoma remains possible.  Marland Kitchen ANEMIA, PERNICIOUS 09/10/2006  . Bradycardia    August, 2013  . CORONARY ARTERY DISEASE 11/07/2008   a. NSTEMI 2001 & STEMI 2008 managed medically. b. Takotsubo 06/2006. c. Cath 2009 -> med rx.  . CVA WITH LEFT HEMIPARESIS 07/22/2007   May, 2009, neurology decided aspirin and Plavix at that time.  . DERMATITIS 03/02/2007  . DJD (degenerative joint disease)    L shoulder impingement  . Ejection fraction    EF 60%, echo, July, 2010, (  EF improved after tach a suitable event 2008)  . Family history of anesthesia complication    daughter has a hard time waking up  . H/O hiatal hernia   . HYPERTENSION 07/15/2006  . IBS (irritable bowel syndrome)   . MENINGIOMA 08/27/2006   Occipital meningioma, surgery, [facility], 2008 /  MRI, [place], June, 2011, no change in lesion, history believe the patient had a gamma knife treatment of a right occipital atypical meningioma  . OSTEOARTHRITIS 07/15/2006  . PULMONARY NODULE 01/14/2007   [facility], December, 2011  . Stroke ([place])   . Subarachnoid hemorrhage following injury ([place]) 2007   Subarachnoid hemorrhage secondary to a fall December, 2007  . Takotsubo syndrome 04/22/2008   MI, May, 2008, question takotsubo event  . THYROID NODULE 01/14/2007   Past Surgical History:  Procedure Laterality  Date  . ABDOMINAL HYSTERECTOMY    . BRAIN TUMOR EXCISION     Meningioma; treated at [facility] in 2008  . CHOLECYSTECTOMY    . JOINT REPLACEMENT     rt THR  . SHOULDER SURGERY      reports that she has never smoked. She has never used smokeless tobacco. She reports that she does not drink alcohol or use drugs. family history includes Heart attack (age of onset: 61) in her brother. Allergies  Allergen Reactions  . Atorvastatin Other (See Comments)    Raises liver enzymes (takes pravastatin at home)  . Influenza Vaccines Other (See Comments)    Unknown  . Phenytoin Other (See Comments)    Blood clots  . Promethazine Hcl Other (See Comments)    Severe sedation  . Sulfamethoxazole     REACTION: unspecified  Per pt daughter she stated this causes rash    Review of Systems  Constitutional: Positive for chills and fatigue. Negative for fever.  HENT: Positive for congestion.   Respiratory: Positive for cough.        Objective:   Physical Exam  Constitutional: She appears well-developed and well-nourished.  HENT:  Right Ear: External ear normal.  Left Ear: External ear normal.  Mouth/Throat: Oropharynx is clear and moist.  Neck: Neck supple.  Cardiovascular: Normal rate and regular rhythm.   Pulmonary/Chest: Effort normal and breath sounds normal. No respiratory distress. She has no wheezes. She has no rales.  Assessment:     Productive cough. This may be all viral but she has increased risk of complication-because of age and overall debilitated state. She is afebrile and has normal pulse oximetry and in no respiratory distress    Plan:     -Start Zithromax and treat for 5 days -Follow-up immediately for any fever or increased shortness of breath -Consider over-the-counter plain Mucinex -Stressed importance of adequate hydration  Eulas Post MD [facility]

## 2016-04-12 ENCOUNTER — Other Ambulatory Visit: Payer: Self-pay | Admitting: Endocrinology

## 2016-04-23 ENCOUNTER — Ambulatory Visit: Payer: Medicare Other | Admitting: Internal Medicine

## 2016-04-24 ENCOUNTER — Encounter: Payer: Self-pay | Admitting: Endocrinology

## 2016-04-24 ENCOUNTER — Ambulatory Visit (INDEPENDENT_AMBULATORY_CARE_PROVIDER_SITE_OTHER): Payer: Medicare Other | Admitting: Endocrinology

## 2016-04-24 VITALS — BP 132/82 | HR 68 | Wt 110.0 lb

## 2016-04-24 DIAGNOSIS — E0590 Thyrotoxicosis, unspecified without thyrotoxic crisis or storm: Secondary | ICD-10-CM | POA: Diagnosis not present

## 2016-04-24 NOTE — Progress Notes (Signed)
Subjective:    Patient ID: [name], female    DOB: Apr 15, 1922, 83 y.o.   MRN: [id number]  HPI Pt returns for f/u of hyperthyroidism (dx'ed 2013; she was started on tapazole, as she can't be isolated due to frail elderly state; she was also noted to have a thyroid nodule, but w/u was felt not to be worthwhile, due to her advanced age and health problems).  She has generalized weakness and weight loss.  Past Medical History:  Diagnosis Date  . Abnormal CT of the chest    Stable patchy/nodular and ground-glass opacities in the right upper lobe, While grossly unchanged, low grade adenocarcinoma remains possible.  Marland Kitchen ANEMIA, PERNICIOUS 09/10/2006  . Bradycardia    August, 2013  . CORONARY ARTERY DISEASE 11/07/2008   a. NSTEMI 2001 & STEMI 2008 managed medically. b. Takotsubo 06/2006. c. Cath 2009 -> med rx.  . CVA WITH LEFT HEMIPARESIS 07/22/2007   May, 2009, neurology decided aspirin and Plavix at that time.  . DERMATITIS 03/02/2007  . DJD (degenerative joint disease)    L shoulder impingement  . Ejection fraction    EF 60%, echo, July, 2010, (  EF improved after tach a suitable event 2008)  . Family history of anesthesia complication    daughter has a hard time waking up  . H/O hiatal hernia   . HYPERTENSION 07/15/2006  . IBS (irritable bowel syndrome)   . MENINGIOMA 08/27/2006   Occipital meningioma, surgery, [facility], 2008 /  MRI, [place], June, 2011, no change in lesion, history believe the patient had a gamma knife treatment of a right occipital atypical meningioma  . OSTEOARTHRITIS 07/15/2006  . PULMONARY NODULE 01/14/2007   [facility], December, 2011  . Stroke ([place])   . Subarachnoid hemorrhage following injury ([place]) 2007   Subarachnoid hemorrhage secondary to a fall December, 2007  . Takotsubo syndrome 04/22/2008   MI, May, 2008, question takotsubo event  . THYROID NODULE 01/14/2007    Past Surgical History:  Procedure Laterality Date  . ABDOMINAL HYSTERECTOMY    . BRAIN TUMOR  EXCISION     Meningioma; treated at [facility] in 2008  . CHOLECYSTECTOMY    . JOINT REPLACEMENT     rt THR  . SHOULDER SURGERY      Social History   Social History  . Marital status: Widowed    Spouse name: N/A  . Number of children: 4  . Years of education: N/A   Occupational History  .  Retired   Social History Main Topics  . Smoking status: Never Smoker  . Smokeless tobacco: Never Used  . Alcohol use No  . Drug use: No  . Sexual activity: No   Other Topics Concern  . Not on file   Social History Narrative   Widowed. Lives in [place], [place] with daughter.     Current Outpatient Prescriptions on File Prior to Visit  Medication Sig Dispense Refill  . B-D SYRINGE LUER-LOK 3CC 3 ML MISC     . BD DISP NEEDLES 25G X 5/8" MISC USE AS DIRECTED ONCE A MONTH FOR B12 INJECTIONS 3 each 4  . clopidogrel (PLAVIX) 75 MG tablet TAKE 1 TABLET DAILY WITH BREAKFAST 90 tablet 1  . cyanocobalamin (,VITAMIN B-12,) 1000 MCG/ML injection INJECT 1 ML (1000 MCG TOTAL) INTRAMUSCULARLY EVERY 30 DAYS 3 mL 3  . cyclobenzaprine (FLEXERIL) 10 MG tablet TAKE 1 TABLET THREE TIMES A DAY AS NEEDED FOR MUSCLE SPASMS 270 tablet 1  . HYDROcodone-acetaminophen (NORCO/VICODIN) 5-325 MG tablet  Take 1 tablet by mouth every 6 (six) hours as needed for moderate pain. 30 tablet 0  . isosorbide mononitrate (IMDUR) 30 MG 24 hr tablet TAKE ONE-HALF (1/2) TABLET DAILY 90 tablet 3  . meclizine (ANTIVERT) 25 MG tablet Take 1 tablet (25 mg total) by mouth 2 (two) times daily as needed for dizziness. 10 tablet 0  . methimazole (TAPAZOLE) 5 MG tablet TAKE 1 TABLET THREE TIMES A WEEK, MONDAY, WEDNESDAY, AND FRIDAY 45 tablet 1  . nitroGLYCERIN (NITROSTAT) 0.4 MG SL tablet Place 1 tablet (0.4 mg total) under the tongue every 5 (five) minutes as needed. For chest pain. 15 tablet 3  . pravastatin (PRAVACHOL) 20 MG tablet TAKE 1 TABLET DAILY IN THE EVENING 90 tablet 1  . PREMARIN vaginal cream USE 0.5 GRAM VAGINALLY TWICE A WEEK  AS NEEDED 90 g 1  . triamcinolone ointment (KENALOG) 0.1 % APPLY 1 APPLICATION TOPICALLY TWICE A DAY FOR DRY SKIN (Patient taking differently: Apply 1 application topically 2 (two) times daily. FOR DRY SKIN) 80 g 1  . [DISCONTINUED] gabapentin (NEURONTIN) 100 MG capsule Take 1 capsule (100 mg total) by mouth at bedtime. (Patient not taking: Reported on 10/20/2014) 90 capsule 2   No current facility-administered medications on file prior to visit.     Allergies  Allergen Reactions  . Atorvastatin Other (See Comments)    Raises liver enzymes (takes pravastatin at home)  . Influenza Vaccines Other (See Comments)    Unknown  . Phenytoin Other (See Comments)    Blood clots  . Promethazine Hcl Other (See Comments)    Severe sedation  . Sulfamethoxazole     REACTION: unspecified  Per pt daughter she stated this causes rash    Family History  Problem Relation Age of Onset  . Heart attack Brother 84    BP 132/82 (BP Location: Left Arm, Patient Position: Sitting)   Pulse 68   Wt 110 lb (49.9 kg)   SpO2 98%   BMI 18.30 kg/m    Review of Systems Denies fever    Objective:   Physical Exam Vital signs: see vs page.   Gen: elderly, frail, no distress.  NECK: There is no palpable thyroid enlargement overall, but the left thyroid nodule is palpable again.  No palpable lymphadenopathy at the anterior neck.    Lab Results  Component Value Date   TSH 1.12 02/23/2016      Assessment & Plan:  Hyperthyroidism: well-controlled.  Please continue the same medication. Weight loss: not thyroid-related, so other causes should be considered, if appropriate at her age.

## 2016-04-24 NOTE — Patient Instructions (Addendum)
Please continue the same methimazole.  You will probably need this for the rest of your life.  if ever you have fever while taking methimazole, stop it and call us, because of the risk of a rare side-effect.   Please come back for a follow-up appointment in 6 months.

## 2016-04-26 DIAGNOSIS — H353131 Nonexudative age-related macular degeneration, bilateral, early dry stage: Secondary | ICD-10-CM | POA: Diagnosis not present

## 2016-05-01 ENCOUNTER — Ambulatory Visit (INDEPENDENT_AMBULATORY_CARE_PROVIDER_SITE_OTHER): Payer: Medicare Other | Admitting: Internal Medicine

## 2016-05-01 ENCOUNTER — Encounter: Payer: Self-pay | Admitting: Internal Medicine

## 2016-05-01 VITALS — BP 120/70 | HR 70 | Temp 97.9°F | Wt 110.6 lb

## 2016-05-01 DIAGNOSIS — E0590 Thyrotoxicosis, unspecified without thyrotoxic crisis or storm: Secondary | ICD-10-CM | POA: Diagnosis not present

## 2016-05-01 DIAGNOSIS — Z8673 Personal history of transient ischemic attack (TIA), and cerebral infarction without residual deficits: Secondary | ICD-10-CM

## 2016-05-01 DIAGNOSIS — I10 Essential (primary) hypertension: Secondary | ICD-10-CM

## 2016-05-01 NOTE — Patient Instructions (Addendum)
WE NOW OFFER   [name]'s FAST TRACK!!!  SAME DAY Appointments for ACUTE CARE  Such as: Sprains, Injuries, cuts, abrasions, rashes, muscle pain, joint pain, back pain Colds, flu, sore throats, headache, allergies, cough, fever  Ear pain, sinus and eye infections Abdominal pain, nausea, vomiting, diarrhea, upset stomach Animal/insect bites  3 Easy Ways to Schedule: Walk-In Scheduling Call in scheduling Mychart Sign-up: [email or web address]   Return in 6 months for follow-up

## 2016-05-01 NOTE — Progress Notes (Signed)
Subjective:    Patient ID: [name], female    DOB: 1922/03/16, 83 y.o.   MRN: [id number]  HPI  83 year old patient who is seen today for her six-month follow-up.  She has a history of essential hypertension, cerebrovascular disease and hyperthyroidism.  She is followed by endocrinology.  Recent thyroid indices were stable.  She remains on Tapazole low-dose  Past Medical History:  Diagnosis Date  . Abnormal CT of the chest    Stable patchy/nodular and ground-glass opacities in the right upper lobe, While grossly unchanged, low grade adenocarcinoma remains possible.  Marland Kitchen ANEMIA, PERNICIOUS 09/10/2006  . Bradycardia    August, 2013  . CORONARY ARTERY DISEASE 11/07/2008   a. NSTEMI 2001 & STEMI 2008 managed medically. b. Takotsubo 06/2006. c. Cath 2009 -> med rx.  . CVA WITH LEFT HEMIPARESIS 07/22/2007   May, 2009, neurology decided aspirin and Plavix at that time.  . DERMATITIS 03/02/2007  . DJD (degenerative joint disease)    L shoulder impingement  . Ejection fraction    EF 60%, echo, July, 2010, (  EF improved after tach a suitable event 2008)  . Family history of anesthesia complication    daughter has a hard time waking up  . H/O hiatal hernia   . HYPERTENSION 07/15/2006  . IBS (irritable bowel syndrome)   . MENINGIOMA 08/27/2006   Occipital meningioma, surgery, [facility], 2008 /  MRI, [place], June, 2011, no change in lesion, history believe the patient had a gamma knife treatment of a right occipital atypical meningioma  . OSTEOARTHRITIS 07/15/2006  . PULMONARY NODULE 01/14/2007   [place], December, 2011  . Stroke ([place])   . Subarachnoid hemorrhage following injury ([place]) 2007   Subarachnoid hemorrhage secondary to a fall December, 2007  . Takotsubo syndrome 04/22/2008   MI, May, 2008, question takotsubo event  . THYROID NODULE 01/14/2007     Social History   Social History  . Marital status: Widowed    Spouse name: N/A  . Number of children: 4  . Years of education: N/A     Occupational History  .  Retired   Social History Main Topics  . Smoking status: Never Smoker  . Smokeless tobacco: Never Used  . Alcohol use No  . Drug use: No  . Sexual activity: No   Other Topics Concern  . Not on file   Social History Narrative   Widowed. Lives in [place], [place] with daughter.     Past Surgical History:  Procedure Laterality Date  . ABDOMINAL HYSTERECTOMY    . BRAIN TUMOR EXCISION     Meningioma; [facility] at [facility] in 2008  . CHOLECYSTECTOMY    . JOINT REPLACEMENT     rt THR  . SHOULDER SURGERY      Family History  Problem Relation Age of Onset  . Heart attack Brother 84    Allergies  Allergen Reactions  . Atorvastatin Other (See Comments)    Raises liver enzymes (takes pravastatin at home)  . Influenza Vaccines Other (See Comments)    Unknown  . Phenytoin Other (See Comments)    Blood clots  . Promethazine Hcl Other (See Comments)    Severe sedation  . Sulfamethoxazole     REACTION: unspecified  Per pt daughter she stated this causes rash    Current Outpatient Prescriptions on File Prior to Visit  Medication Sig Dispense Refill  . B-D SYRINGE LUER-LOK 3CC 3 ML MISC     . BD DISP NEEDLES 25G X 5/8"  MISC USE AS DIRECTED ONCE A MONTH FOR B12 INJECTIONS 3 each 4  . clopidogrel (PLAVIX) 75 MG tablet TAKE 1 TABLET DAILY WITH BREAKFAST 90 tablet 1  . cyanocobalamin (,VITAMIN B-12,) 1000 MCG/ML injection INJECT 1 ML (1000 MCG TOTAL) INTRAMUSCULARLY EVERY 30 DAYS 3 mL 3  . cyclobenzaprine (FLEXERIL) 10 MG tablet TAKE 1 TABLET THREE TIMES A DAY AS NEEDED FOR MUSCLE SPASMS 270 tablet 1  . HYDROcodone-acetaminophen (NORCO/VICODIN) 5-325 MG tablet Take 1 tablet by mouth every 6 (six) hours as needed for moderate pain. 30 tablet 0  . isosorbide mononitrate (IMDUR) 30 MG 24 hr tablet TAKE ONE-HALF (1/2) TABLET DAILY 90 tablet 3  . meclizine (ANTIVERT) 25 MG tablet Take 1 tablet (25 mg total) by mouth 2 (two) times daily as needed for dizziness. 10  tablet 0  . methimazole (TAPAZOLE) 5 MG tablet TAKE 1 TABLET THREE TIMES A WEEK, MONDAY, WEDNESDAY, AND FRIDAY 45 tablet 1  . nitroGLYCERIN (NITROSTAT) 0.4 MG SL tablet Place 1 tablet (0.4 mg total) under the tongue every 5 (five) minutes as needed. For chest pain. 15 tablet 3  . pravastatin (PRAVACHOL) 20 MG tablet TAKE 1 TABLET DAILY IN THE EVENING 90 tablet 1  . PREMARIN vaginal cream USE 0.5 GRAM VAGINALLY TWICE A WEEK AS NEEDED 90 g 1  . triamcinolone ointment (KENALOG) 0.1 % APPLY 1 APPLICATION TOPICALLY TWICE A DAY FOR DRY SKIN (Patient taking differently: Apply 1 application topically 2 (two) times daily. FOR DRY SKIN) 80 g 1  . [DISCONTINUED] gabapentin (NEURONTIN) 100 MG capsule Take 1 capsule (100 mg total) by mouth at bedtime. (Patient not taking: Reported on 10/20/2014) 90 capsule 2   No current facility-administered medications on file prior to visit.     BP 120/70 (BP Location: Left Arm, Patient Position: Sitting, Cuff Size: Normal)   Pulse 70   Temp 97.9 F (36.6 C) (Oral)   Wt 110 lb 9.6 oz (50.2 kg)   SpO2 97%   BMI 18.40 kg/m     Review of Systems  Constitutional: Negative.   HENT: Negative for congestion, dental problem, hearing loss, rhinorrhea, sinus pressure, sore throat and tinnitus.   Eyes: Negative for pain, discharge and visual disturbance.  Respiratory: Negative for cough and shortness of breath.   Cardiovascular: Negative for chest pain, palpitations and leg swelling.  Gastrointestinal: Negative for abdominal distention, abdominal pain, blood in stool, constipation, diarrhea, nausea and vomiting.  Genitourinary: Negative for difficulty urinating, dysuria, flank pain, frequency, hematuria, pelvic pain, urgency, vaginal bleeding, vaginal discharge and vaginal pain.  Musculoskeletal: Positive for arthralgias and back pain. Negative for gait problem and joint swelling.  Skin: Negative for rash.  Neurological: Negative for dizziness, syncope, speech difficulty,  weakness, numbness and headaches.  Hematological: Negative for adenopathy.  Psychiatric/Behavioral: Negative for agitation, behavioral problems and dysphoric mood. The patient is not nervous/anxious.        Objective:   Physical Exam  Constitutional: She is oriented to person, place, and time. She appears well-developed and well-nourished.  Elderly, frail, alert, no distress.  Blood pressure well controlled  HENT:  Head: Normocephalic.  Right Ear: External ear normal.  Left Ear: External ear normal.  Mouth/Throat: Oropharynx is clear and moist.  Eyes: Conjunctivae and EOM are normal. Pupils are equal, round, and reactive to light.  Neck: Normal range of motion. Neck supple. No thyromegaly present.  Cardiovascular: Normal rate, regular rhythm, normal heart sounds and intact distal pulses.   Pulmonary/Chest: Effort normal and breath sounds normal.  Abdominal: Soft. Bowel sounds are normal. She exhibits no mass. There is no tenderness.  Musculoskeletal: Normal range of motion.  Lymphadenopathy:    She has no cervical adenopathy.  Neurological: She is alert and oriented to person, place, and time.  Skin: Skin is warm and dry. No rash noted.  Psychiatric: She has a normal mood and affect. Her behavior is normal.          Assessment & Plan:   Essential hypertension.  Well-controlled Hypothyroidism.  Remains euthyroid on anti-thyroid medication Cerebrovascular disease, stable  No change in medical regimen Follow-up 6 months  [id number],PETER Pilar Plate

## 2016-05-01 NOTE — Progress Notes (Signed)
Pre visit review using our clinic review tool, if applicable. No additional management support is needed unless otherwise documented below in the visit note. 

## 2016-05-05 ENCOUNTER — Other Ambulatory Visit: Payer: Self-pay | Admitting: Internal Medicine

## 2016-05-07 ENCOUNTER — Encounter: Payer: Self-pay | Admitting: Internal Medicine

## 2016-05-07 ENCOUNTER — Ambulatory Visit (INDEPENDENT_AMBULATORY_CARE_PROVIDER_SITE_OTHER): Payer: Medicare Other | Admitting: Internal Medicine

## 2016-05-07 ENCOUNTER — Ambulatory Visit (INDEPENDENT_AMBULATORY_CARE_PROVIDER_SITE_OTHER)
Admission: RE | Admit: 2016-05-07 | Discharge: 2016-05-07 | Disposition: A | Discharge status: Home / Self Care | Payer: Medicare Other | Source: Ambulatory Visit | Attending: Internal Medicine | Admitting: Internal Medicine

## 2016-05-07 VITALS — BP 138/66 | HR 92 | Temp 97.4°F | Ht 65.0 in | Wt 109.8 lb

## 2016-05-07 DIAGNOSIS — M25551 Pain in right hip: Secondary | ICD-10-CM

## 2016-05-07 DIAGNOSIS — M25561 Pain in right knee: Secondary | ICD-10-CM | POA: Diagnosis not present

## 2016-05-07 DIAGNOSIS — I10 Essential (primary) hypertension: Secondary | ICD-10-CM | POA: Diagnosis not present

## 2016-05-07 DIAGNOSIS — S79911A Unspecified injury of right hip, initial encounter: Secondary | ICD-10-CM | POA: Diagnosis not present

## 2016-05-07 NOTE — Patient Instructions (Signed)
X-ray right hip and pelvis as discussed  Take pain medications as prescribed  Call or return to clinic prn if these symptoms worsen or fail to improve as anticipated.

## 2016-05-07 NOTE — Progress Notes (Signed)
Subjective:    Patient ID: [name], female    DOB: Aug 13, 1922, 83 y.o.   MRN: [id number]  HPI  83 year old patient who fell in the bathroom.  4 days ago and presents today with a chief complaint of persistent right hip pain.  Pain is aggravated by weightbearing and especially bothersome at night when she lies on her right side. She has had a right total hip replacement surgery in the past. She states the pain has worsened over the past few days and not improved  Past Medical History:  Diagnosis Date  . Abnormal CT of the chest    Stable patchy/nodular and ground-glass opacities in the right upper lobe, While grossly unchanged, low grade adenocarcinoma remains possible.  Marland Kitchen ANEMIA, PERNICIOUS 09/10/2006  . Bradycardia    August, 2013  . CORONARY ARTERY DISEASE 11/07/2008   a. NSTEMI 2001 & STEMI 2008 managed medically. b. Takotsubo 06/2006. c. Cath 2009 -> med rx.  . CVA WITH LEFT HEMIPARESIS 07/22/2007   May, 2009, neurology decided aspirin and Plavix at that time.  . DERMATITIS 03/02/2007  . DJD (degenerative joint disease)    L shoulder impingement  . Ejection fraction    EF 60%, echo, July, 2010, (  EF improved after tach a suitable event 2008)  . Family history of anesthesia complication    daughter has a hard time waking up  . H/O hiatal hernia   . HYPERTENSION 07/15/2006  . IBS (irritable bowel syndrome)   . MENINGIOMA 08/27/2006   Occipital meningioma, surgery, [facility], 2008 /  MRI, [place], June, 2011, no change in lesion, history believe the patient had a gamma knife treatment of a right occipital atypical meningioma  . OSTEOARTHRITIS 07/15/2006  . PULMONARY NODULE 01/14/2007   [place], December, 2011  . Stroke ([place])   . Subarachnoid hemorrhage following injury ([place]) 2007   Subarachnoid hemorrhage secondary to a fall December, 2007  . Takotsubo syndrome 04/22/2008   MI, May, 2008, question takotsubo event  . THYROID NODULE 01/14/2007     Social History   Social  History  . Marital status: Widowed    Spouse name: N/A  . Number of children: 4  . Years of education: N/A   Occupational History  .  Retired   Social History Main Topics  . Smoking status: Never Smoker  . Smokeless tobacco: Never Used  . Alcohol use No  . Drug use: No  . Sexual activity: No   Other Topics Concern  . Not on file   Social History Narrative   Widowed. Lives in [place], [place] with daughter.     Past Surgical History:  Procedure Laterality Date  . ABDOMINAL HYSTERECTOMY    . BRAIN TUMOR EXCISION     Meningioma; treated at [facility] in 2008  . CHOLECYSTECTOMY    . JOINT REPLACEMENT     rt THR  . SHOULDER SURGERY      Family History  Problem Relation Age of Onset  . Heart attack Brother 84    Allergies  Allergen Reactions  . Atorvastatin Other (See Comments)    Raises liver enzymes (takes pravastatin at home)  . Influenza Vaccines Other (See Comments)    Unknown  . Phenytoin Other (See Comments)    Blood clots  . Promethazine Hcl Other (See Comments)    Severe sedation  . Sulfamethoxazole     REACTION: unspecified  Per pt daughter she stated this causes rash    Current Outpatient Prescriptions on File Prior to  Visit  Medication Sig Dispense Refill  . B-D SYRINGE LUER-LOK 3CC 3 ML MISC     . BD DISP NEEDLES 25G X 5/8" MISC USE AS DIRECTED ONCE A MONTH FOR B12 INJECTIONS 3 each 4  . clopidogrel (PLAVIX) 75 MG tablet TAKE 1 TABLET DAILY WITH BREAKFAST 90 tablet 1  . cyanocobalamin (,VITAMIN B-12,) 1000 MCG/ML injection INJECT 1 ML (1000 MCG TOTAL) INTRAMUSCULARLY EVERY 30 DAYS 3 mL 3  . cyclobenzaprine (FLEXERIL) 10 MG tablet TAKE 1 TABLET THREE TIMES A DAY AS NEEDED FOR MUSCLE SPASMS 270 tablet 1  . HYDROcodone-acetaminophen (NORCO/VICODIN) 5-325 MG tablet Take 1 tablet by mouth every 6 (six) hours as needed for moderate pain. 30 tablet 0  . isosorbide mononitrate (IMDUR) 30 MG 24 hr tablet TAKE ONE-HALF (1/2) TABLET DAILY 90 tablet 3  .  meclizine (ANTIVERT) 25 MG tablet Take 1 tablet (25 mg total) by mouth 2 (two) times daily as needed for dizziness. 10 tablet 0  . methimazole (TAPAZOLE) 5 MG tablet TAKE 1 TABLET THREE TIMES A WEEK, MONDAY, WEDNESDAY, AND FRIDAY 45 tablet 1  . nitroGLYCERIN (NITROSTAT) 0.4 MG SL tablet Place 1 tablet (0.4 mg total) under the tongue every 5 (five) minutes as needed. For chest pain. 15 tablet 3  . pravastatin (PRAVACHOL) 20 MG tablet TAKE 1 TABLET DAILY IN THE EVENING 90 tablet 1  . PREMARIN vaginal cream USE 0.5 GRAM VAGINALLY TWICE A WEEK AS NEEDED 90 g 1  . triamcinolone ointment (KENALOG) 0.1 % APPLY 1 APPLICATION TOPICALLY TWICE A DAY FOR DRY SKIN (Patient taking differently: Apply 1 application topically 2 (two) times daily. FOR DRY SKIN) 80 g 1  . [DISCONTINUED] gabapentin (NEURONTIN) 100 MG capsule Take 1 capsule (100 mg total) by mouth at bedtime. (Patient not taking: Reported on 10/20/2014) 90 capsule 2   No current facility-administered medications on file prior to visit.     BP 138/66 (BP Location: Left Arm, Patient Position: Sitting, Cuff Size: Normal)   Pulse 92   Temp 97.4 F (36.3 C) (Oral)   Ht 5\' 5"  (1.651 m)   Wt 109 lb 12.8 oz (49.8 kg)   SpO2 97%   BMI 18.27 kg/m     Review of Systems  Constitutional: Negative.   HENT: Negative for congestion, dental problem, hearing loss, rhinorrhea, sinus pressure, sore throat and tinnitus.   Eyes: Negative for pain, discharge and visual disturbance.  Respiratory: Negative for cough and shortness of breath.   Cardiovascular: Negative for chest pain, palpitations and leg swelling.  Gastrointestinal: Negative for abdominal distention, abdominal pain, blood in stool, constipation, diarrhea, nausea and vomiting.  Genitourinary: Negative for difficulty urinating, dysuria, flank pain, frequency, hematuria, pelvic pain, urgency, vaginal bleeding, vaginal discharge and vaginal pain.  Musculoskeletal: Positive for arthralgias and gait  problem. Negative for joint swelling.  Skin: Negative for rash.  Neurological: Negative for dizziness, syncope, speech difficulty, weakness, numbness and headaches.  Hematological: Negative for adenopathy.  Psychiatric/Behavioral: Negative for agitation, behavioral problems and dysphoric mood. The patient is not nervous/anxious.        Objective:   Physical Exam  Constitutional: She appears well-developed and well-nourished. No distress.  Musculoskeletal:  Walks with a cane [place] Mild discomfort with right flexion of the hip          Assessment & Plan:   Posttraumatic right hip pain.  Will review radiographs.  Continue analgesics  Rule out pelvic or right hip fracture  Nyoka Cowden

## 2016-05-07 NOTE — Progress Notes (Signed)
Pre visit review using our clinic review tool, if applicable. No additional management support is needed unless otherwise documented below in the visit note. 

## 2016-05-13 ENCOUNTER — Other Ambulatory Visit: Payer: Self-pay | Admitting: Internal Medicine

## 2016-05-13 ENCOUNTER — Telehealth: Payer: Self-pay | Admitting: Internal Medicine

## 2016-05-13 MED ORDER — "NEEDLE (DISP) 25G X 5/8"" MISC": 4 refills | Status: DC

## 2016-05-13 NOTE — Telephone Encounter (Signed)
[facility]

## 2016-05-13 NOTE — Telephone Encounter (Signed)
Pt never received bd disposable needles 25 g x5/8 [facility]

## 2016-05-23 DIAGNOSIS — L84 Corns and callosities: Secondary | ICD-10-CM | POA: Diagnosis not present

## 2016-05-23 DIAGNOSIS — L603 Nail dystrophy: Secondary | ICD-10-CM | POA: Diagnosis not present

## 2016-05-23 DIAGNOSIS — I739 Peripheral vascular disease, unspecified: Secondary | ICD-10-CM | POA: Diagnosis not present

## 2016-05-27 ENCOUNTER — Ambulatory Visit (INDEPENDENT_AMBULATORY_CARE_PROVIDER_SITE_OTHER): Payer: Medicare Other | Admitting: Internal Medicine

## 2016-05-27 ENCOUNTER — Encounter: Payer: Self-pay | Admitting: Internal Medicine

## 2016-05-27 VITALS — BP 162/90 | Temp 98.3°F | Ht 65.0 in | Wt 111.0 lb

## 2016-05-27 DIAGNOSIS — I10 Essential (primary) hypertension: Secondary | ICD-10-CM

## 2016-05-27 DIAGNOSIS — D320 Benign neoplasm of cerebral meninges: Secondary | ICD-10-CM

## 2016-05-27 NOTE — Progress Notes (Signed)
Pre visit review using our clinic review tool, if applicable. No additional management support is needed unless otherwise documented below in the visit note. 

## 2016-05-27 NOTE — Progress Notes (Signed)
Subjective:    Patient ID: [name], female    DOB: July 31, 1922, 83 y.o.   MRN: [id number]  HPI  83 year old patient who has remote history of a meningioma which required a right occipital craniotomy.  Approximately 2 months ago the patient fell with some minor head trauma involving the occipital region. Since that time she has had a awareness of a nodule involving the occipital scalp region.  This is bothersome when she lays flat and applies pressure to the occipital scalp  Past Medical History:  Diagnosis Date  . Abnormal CT of the chest    Stable patchy/nodular and ground-glass opacities in the right upper lobe, While grossly unchanged, low grade adenocarcinoma remains possible.  Marland Kitchen ANEMIA, PERNICIOUS 09/10/2006  . Bradycardia    August, 2013  . CORONARY ARTERY DISEASE 11/07/2008   a. NSTEMI 2001 & STEMI 2008 managed medically. b. Takotsubo 06/2006. c. Cath 2009 -> med rx.  . CVA WITH LEFT HEMIPARESIS 07/22/2007   May, 2009, neurology decided aspirin and Plavix at that time.  . DERMATITIS 03/02/2007  . DJD (degenerative joint disease)    L shoulder impingement  . Ejection fraction    EF 60%, echo, July, 2010, (  EF improved after tach a suitable event 2008)  . Family history of anesthesia complication    daughter has a hard time waking up  . H/O hiatal hernia   . HYPERTENSION 07/15/2006  . IBS (irritable bowel syndrome)   . MENINGIOMA 08/27/2006   Occipital meningioma, surgery, [facility], 2008 /  MRI, [place], June, 2011, no change in lesion, history believe the patient had a gamma knife treatment of a right occipital atypical meningioma  . OSTEOARTHRITIS 07/15/2006  . PULMONARY NODULE 01/14/2007   [place], December, 2011  . Stroke ([place])   . Subarachnoid hemorrhage following injury ([place]) 2007   Subarachnoid hemorrhage secondary to a fall December, 2007  . Takotsubo syndrome 04/22/2008   MI, May, 2008, question takotsubo event  . THYROID NODULE 01/14/2007     Social History    Social History  . Marital status: Widowed    Spouse name: N/A  . Number of children: 4  . Years of education: N/A   Occupational History  .  Retired   Social History Main Topics  . Smoking status: Never Smoker  . Smokeless tobacco: Never Used  . Alcohol use No  . Drug use: No  . Sexual activity: No   Other Topics Concern  . Not on file   Social History Narrative   Widowed. Lives in [place], [place] with daughter.     Past Surgical History:  Procedure Laterality Date  . ABDOMINAL HYSTERECTOMY    . BRAIN TUMOR EXCISION     Meningioma; [facility] at [facility] in [facility]  . CHOLECYSTECTOMY    . JOINT REPLACEMENT     rt THR  . SHOULDER SURGERY      Family History  Problem Relation Age of Onset  . Heart attack Brother 84    Allergies  Allergen Reactions  . Atorvastatin Other (See Comments)    Raises liver enzymes (takes pravastatin at home)  . Influenza Vaccines Other (See Comments)    Unknown  . Phenytoin Other (See Comments)    Blood clots  . Promethazine Hcl Other (See Comments)    Severe sedation  . Sulfamethoxazole     REACTION: unspecified  Per pt daughter she stated this causes rash    Current Outpatient Prescriptions on File Prior to Visit  Medication Sig  Dispense Refill  . B-D SYRINGE LUER-LOK 3CC 3 ML MISC     . clopidogrel (PLAVIX) 75 MG tablet TAKE 1 TABLET DAILY WITH BREAKFAST 90 tablet 1  . cyanocobalamin (,VITAMIN B-12,) 1000 MCG/ML injection INJECT 1 ML (1000 MCG TOTAL) INTRAMUSCULARLY EVERY 30 DAYS 3 mL 3  . cyclobenzaprine (FLEXERIL) 10 MG tablet TAKE 1 TABLET THREE TIMES A DAY AS NEEDED FOR MUSCLE SPASMS 270 tablet 1  . HYDROcodone-acetaminophen (NORCO/VICODIN) 5-325 MG tablet Take 1 tablet by mouth every 6 (six) hours as needed for moderate pain. 30 tablet 0  . isosorbide mononitrate (IMDUR) 30 MG 24 hr tablet TAKE ONE-HALF (1/2) TABLET DAILY 90 tablet 3  . meclizine (ANTIVERT) 25 MG tablet Take 1 tablet (25 mg total) by mouth 2 (two) times daily  as needed for dizziness. 10 tablet 0  . methimazole (TAPAZOLE) 5 MG tablet TAKE 1 TABLET THREE TIMES A WEEK, MONDAY, WEDNESDAY, AND FRIDAY 45 tablet 1  . NEEDLE, DISP, 25 G (BD DISP NEEDLES) 25G X 5/8" MISC USE AS DIRECTED ONCE A MONTH FOR B12 INJECTIONS 3 each 4  . nitroGLYCERIN (NITROSTAT) 0.4 MG SL tablet Place 1 tablet (0.4 mg total) under the tongue every 5 (five) minutes as needed. For chest pain. 15 tablet 3  . pravastatin (PRAVACHOL) 20 MG tablet TAKE 1 TABLET DAILY IN THE EVENING 90 tablet 1  . PREMARIN vaginal cream USE 0.5 GRAM VAGINALLY TWICE A WEEK AS NEEDED 90 g 1  . triamcinolone ointment (KENALOG) 0.1 % APPLY 1 APPLICATION TOPICALLY TWICE A DAY FOR DRY SKIN (Patient taking differently: Apply 1 application topically 2 (two) times daily. FOR DRY SKIN) 80 g 1  . [DISCONTINUED] gabapentin (NEURONTIN) 100 MG capsule Take 1 capsule (100 mg total) by mouth at bedtime. (Patient not taking: Reported on 10/20/2014) 90 capsule 2   No current facility-administered medications on file prior to visit.     BP (!) 162/90 (BP Location: Right Arm, Patient Position: Sitting, Cuff Size: Normal)   Temp 98.3 F (36.8 C) (Oral)   Ht 5\' 5"  (1.651 m)   Wt 111 lb (50.3 kg)   BMI 18.47 kg/m     Review of Systems  Constitutional: Negative.   HENT: Negative for congestion, dental problem, hearing loss, rhinorrhea, sinus pressure, sore throat and tinnitus.   Eyes: Negative for pain, discharge and visual disturbance.  Respiratory: Negative for cough and shortness of breath.   Cardiovascular: Negative for chest pain, palpitations and leg swelling.  Gastrointestinal: Negative for abdominal distention, abdominal pain, blood in stool, constipation, diarrhea, nausea and vomiting.  Genitourinary: Negative for difficulty urinating, dysuria, flank pain, frequency, hematuria, pelvic pain, urgency, vaginal bleeding, vaginal discharge and vaginal pain.  Musculoskeletal: Negative for arthralgias, gait problem and  joint swelling.  Skin: Negative for rash.  Neurological: Positive for headaches. Negative for dizziness, syncope, speech difficulty, weakness and numbness.  Hematological: Negative for adenopathy.  Psychiatric/Behavioral: Negative for agitation, behavioral problems and dysphoric mood. The patient is nervous/anxious.        Objective:   Physical Exam  Constitutional: She appears well-developed and well-nourished. No distress.  Repeat blood pressure 132/74  Skin:  A small depression was noted involving the right occipital skull from prior craniotomy Just medial to this area was a soft, nontender, subcutaneous nodule          Assessment & Plan:   Nodule right occipital scalp region.  Favor noninflamed sebaceous cyst, possibly resolving small hematoma.  Patient reassured.  Will observe at this time  Will report any worsening pain or enlargement  Nyoka Cowden

## 2016-05-27 NOTE — Patient Instructions (Signed)
Call or return to clinic prn if these symptoms worsen or fail to improve as anticipated.

## 2016-05-30 ENCOUNTER — Other Ambulatory Visit: Payer: Self-pay | Admitting: Internal Medicine

## 2016-07-06 ENCOUNTER — Other Ambulatory Visit: Payer: Self-pay | Admitting: Internal Medicine

## 2016-08-02 ENCOUNTER — Emergency Department (HOSPITAL_COMMUNITY): Payer: Medicare Other

## 2016-08-02 ENCOUNTER — Encounter (HOSPITAL_COMMUNITY): Payer: Self-pay | Admitting: Emergency Medicine

## 2016-08-02 ENCOUNTER — Emergency Department (HOSPITAL_COMMUNITY)
Admission: EM | Admit: 2016-08-02 | Discharge: 2016-08-02 | Disposition: A | Discharge status: Home / Self Care | Payer: Medicare Other | Attending: Emergency Medicine | Admitting: Emergency Medicine

## 2016-08-02 DIAGNOSIS — I2510 Atherosclerotic heart disease of native coronary artery without angina pectoris: Secondary | ICD-10-CM | POA: Insufficient documentation

## 2016-08-02 DIAGNOSIS — Y929 Unspecified place or not applicable: Secondary | ICD-10-CM | POA: Insufficient documentation

## 2016-08-02 DIAGNOSIS — Z8673 Personal history of transient ischemic attack (TIA), and cerebral infarction without residual deficits: Secondary | ICD-10-CM | POA: Diagnosis not present

## 2016-08-02 DIAGNOSIS — W010XXA Fall on same level from slipping, tripping and stumbling without subsequent striking against object, initial encounter: Secondary | ICD-10-CM | POA: Insufficient documentation

## 2016-08-02 DIAGNOSIS — S064X0A Epidural hemorrhage without loss of consciousness, initial encounter: Secondary | ICD-10-CM | POA: Diagnosis not present

## 2016-08-02 DIAGNOSIS — M25532 Pain in left wrist: Secondary | ICD-10-CM | POA: Diagnosis not present

## 2016-08-02 DIAGNOSIS — I10 Essential (primary) hypertension: Secondary | ICD-10-CM | POA: Diagnosis not present

## 2016-08-02 DIAGNOSIS — Y939 Activity, unspecified: Secondary | ICD-10-CM | POA: Diagnosis not present

## 2016-08-02 DIAGNOSIS — S0990XA Unspecified injury of head, initial encounter: Secondary | ICD-10-CM | POA: Diagnosis not present

## 2016-08-02 DIAGNOSIS — S51012A Laceration without foreign body of left elbow, initial encounter: Secondary | ICD-10-CM | POA: Insufficient documentation

## 2016-08-02 DIAGNOSIS — S62102A Fracture of unspecified carpal bone, left wrist, initial encounter for closed fracture: Secondary | ICD-10-CM | POA: Diagnosis not present

## 2016-08-02 DIAGNOSIS — S6992XA Unspecified injury of left wrist, hand and finger(s), initial encounter: Secondary | ICD-10-CM | POA: Diagnosis present

## 2016-08-02 DIAGNOSIS — Y999 Unspecified external cause status: Secondary | ICD-10-CM | POA: Diagnosis not present

## 2016-08-02 DIAGNOSIS — S52502A Unspecified fracture of the lower end of left radius, initial encounter for closed fracture: Secondary | ICD-10-CM | POA: Diagnosis not present

## 2016-08-02 DIAGNOSIS — S0003XA Contusion of scalp, initial encounter: Secondary | ICD-10-CM | POA: Diagnosis not present

## 2016-08-02 MED ORDER — BUPIVACAINE HCL 0.25 % IJ SOLN
10.0000 mL | Freq: Once | INTRAMUSCULAR | Status: AC
  Administered 2016-08-02: 10 mL
  Filled 2016-08-02: qty 10

## 2016-08-02 MED ORDER — FENTANYL CITRATE (PF) 100 MCG/2ML IJ SOLN
50.0000 ug | Freq: Once | INTRAMUSCULAR | Status: AC
  Administered 2016-08-02: 50 ug via INTRAVENOUS
  Filled 2016-08-02: qty 2

## 2016-08-02 NOTE — ED Triage Notes (Signed)
Per ems, pt lives alone, tripped over something and fell into a door frame, hyper extended her left wrist. Swelling to L wrist, pain with movement. Pt has hematoma to back left side of head. Old hematoma from another fall and new hematoma on head. Denies LOC, no neck or back pain. Passed [facility]. Brace on left wrist. AAOX4. Ambulated on scene with ems. Pt given 50 mcgs fentanyl.

## 2016-08-02 NOTE — ED Provider Notes (Signed)
[facility] DEPT Provider Note   CSN: [id number] Arrival date & time: 08/02/16  8299   By signing my name below, I, Evelene Croon, attest that this documentation has been prepared under the direction and in the presence of Virgel Manifold, MD . Electronically Signed: Evelene Croon, Scribe. 08/02/2016. 9:39 AM.  History   Chief Complaint Chief Complaint  Patient presents with  . Fall  . Wrist Pain   The history is provided by the patient and a relative. No language interpreter was used.     HPI Comments:  [name] is a 83 y.o. female who presents to the Emergency Department s/p fall this AM complaining of pain to the left wrist following the incident. She has associated swelling to the site, and a abrasion to the left forearm. Pt tripped and fell into a door frame. No LOC.  Pt is currently on plavix. Tetanus is UTD within the last 10 years per daughter.    Past Medical History:  Diagnosis Date  . Abnormal CT of the chest    Stable patchy/nodular and ground-glass opacities in the right upper lobe, While grossly unchanged, low grade adenocarcinoma remains possible.  Marland Kitchen ANEMIA, PERNICIOUS 09/10/2006  . Bradycardia    August, 2013  . CORONARY ARTERY DISEASE 11/07/2008   a. NSTEMI 2001 & STEMI 2008 managed medically. b. Takotsubo 06/2006. c. Cath 2009 -> med rx.  . CVA WITH LEFT HEMIPARESIS 07/22/2007   May, 2009, neurology decided aspirin and Plavix at that time.  . DERMATITIS 03/02/2007  . DJD (degenerative joint disease)    L shoulder impingement  . Ejection fraction    EF 60%, echo, July, 2010, (  EF improved after tach a suitable event 2008)  . Family history of anesthesia complication    daughter has a hard time waking up  . H/O hiatal hernia   . HYPERTENSION 07/15/2006  . IBS (irritable bowel syndrome)   . MENINGIOMA 08/27/2006   Occipital meningioma, surgery, [facility], 2008 /  MRI, [facility], June, 2011, no change in lesion, history believe the patient had a gamma knife  treatment of a right occipital atypical meningioma  . OSTEOARTHRITIS 07/15/2006  . PULMONARY NODULE 01/14/2007   [facility], December, 2011  . Stroke ([place])   . Subarachnoid hemorrhage following injury ([place]) 2007   Subarachnoid hemorrhage secondary to a fall December, 2007  . Takotsubo syndrome 04/22/2008   MI, May, 2008, question takotsubo event  . THYROID NODULE 01/14/2007    Patient Active Problem List   Diagnosis Date Noted  . History of stroke 11/15/2014  . History of meningioma 11/15/2014  . GERD (gastroesophageal reflux disease) 11/19/2011  . Dysphagia 11/18/2011  . Palpitations 11/17/2011  . Encounter for long-term (current) use of other medications 10/29/2011  . Hyperthyroidism 10/14/2011  . Vertigo 10/14/2011  . Bradycardia   . Subarachnoid hemorrhage following injury ([place])   . Hyperlipidemia 03/05/2011  . Takotsubo syndrome 04/22/2008  . Hemiplegia, late effect of cerebrovascular disease ([place]) 07/22/2007  . THYROID NODULE 01/14/2007  . PULMONARY NODULE 01/14/2007  . MENINGIOMA 08/27/2006  . Essential hypertension 07/15/2006  . Osteoarthritis 07/15/2006    Past Surgical History:  Procedure Laterality Date  . ABDOMINAL HYSTERECTOMY    . BRAIN TUMOR EXCISION     Meningioma; treated at [facility] in 2008  . CHOLECYSTECTOMY    . JOINT REPLACEMENT     rt THR  . SHOULDER SURGERY      OB History    No data available  Home Medications    Prior to Admission medications   Medication Sig Start Date End Date Taking? Authorizing Provider  B-D SYRINGE LUER-LOK 3CC 3 ML MISC  04/05/15   [provider]  clopidogrel (PLAVIX) 75 MG tablet TAKE 1 TABLET DAILY WITH BREAKFAST 07/10/16   Marletta Lor, MD  cyanocobalamin (,VITAMIN B-12,) 1000 MCG/ML injection INJECT 1 ML (1000 MCG TOTAL) INTRAMUSCULARLY EVERY 30 DAYS 05/30/16   Marletta Lor, MD  cyclobenzaprine (FLEXERIL) 10 MG tablet TAKE 1 TABLET THREE TIMES A DAY AS NEEDED FOR MUSCLE SPASMS 04/18/14    Marletta Lor, MD  HYDROcodone-acetaminophen (NORCO/VICODIN) 5-325 MG tablet Take 1 tablet by mouth every 6 (six) hours as needed for moderate pain. 10/25/15   Marletta Lor, MD  isosorbide mononitrate (IMDUR) 30 MG 24 hr tablet TAKE ONE-HALF (1/2) TABLET DAILY 10/12/15   Marletta Lor, MD  meclizine (ANTIVERT) 25 MG tablet Take 1 tablet (25 mg total) by mouth 2 (two) times daily as needed for dizziness. 10/21/14   Everlene Balls, MD  methimazole (TAPAZOLE) 5 MG tablet TAKE 1 TABLET THREE TIMES A WEEK, MONDAY, WEDNESDAY, AND FRIDAY 04/12/16   Renato Shin, MD  NEEDLE, DISP, 25 G (BD DISP NEEDLES) 25G X 5/8" MISC USE AS DIRECTED ONCE A MONTH FOR B12 INJECTIONS 05/13/16   Marletta Lor, MD  nitroGLYCERIN (NITROSTAT) 0.4 MG SL tablet Place 1 tablet (0.4 mg total) under the tongue every 5 (five) minutes as needed. For chest pain. 05/04/13   Marletta Lor, MD  pravastatin (PRAVACHOL) 20 MG tablet TAKE 1 TABLET DAILY IN THE EVENING 07/10/16   Marletta Lor, MD  PREMARIN vaginal cream USE 0.5 GRAM VAGINALLY TWICE A WEEK AS NEEDED 01/05/15   Marletta Lor, MD  triamcinolone ointment (KENALOG) 0.1 % APPLY 1 APPLICATION TOPICALLY TWICE A DAY FOR DRY SKIN Patient taking differently: Apply 1 application topically 2 (two) times daily. FOR DRY SKIN 04/18/14   Marletta Lor, MD    Family History Family History  Problem Relation Age of Onset  . Heart attack Brother 31    Social History Social History  Substance Use Topics  . Smoking status: Never Smoker  . Smokeless tobacco: Never Used  . Alcohol use No     Allergies   Atorvastatin; Influenza vaccines; Phenytoin; Promethazine hcl; and Sulfamethoxazole   Review of Systems Review of Systems  Musculoskeletal: Positive for arthralgias and joint swelling.  Skin: Positive for wound.  Neurological: Negative for syncope and weakness.  All other systems reviewed and are negative.    Physical Exam Updated  Vital Signs BP 133/68   Pulse 61   Temp 97.6 F (36.4 C) (Oral)   Resp 18   Ht 5\' 5"  (1.651 m)   Wt 110 lb (49.9 kg)   SpO2 98%   BMI 18.30 kg/m   Physical Exam  Constitutional: She is oriented to person, place, and time. She appears well-developed and well-nourished. No distress.  HENT:  Head: Normocephalic and atraumatic.  Eyes: Conjunctivae are normal.  Cardiovascular: Normal rate.   Pulmonary/Chest: Effort normal.  Abdominal: She exhibits no distension.  Musculoskeletal:  Deformity, tenderness, and swelling to distal left wrist and proximal hand; closed injury  Can move all digits  No midline spinal tenderness   Neurological: She is alert and oriented to person, place, and time.  Skin: Skin is warm and dry.  Small skin tear to left elbow; no gross contamination, no bony tenderness Small hematoma to left parietal  region   Nursing note and vitals reviewed.    ED Treatments / Results  DIAGNOSTIC STUDIES:  Oxygen Saturation is 98% on RA, normal by my interpretation.    COORDINATION OF CARE:  9:35 AM Discussed treatment plan with pt at bedside and pt agreed to plan.   Labs (all labs ordered are listed, but only abnormal results are displayed) Labs Reviewed - No data to display  EKG  EKG Interpretation None       Radiology Dg Wrist Complete Left  Result Date: 08/02/2016 CLINICAL DATA:  Fall. EXAM: LEFT WRIST - COMPLETE 3+ VIEW COMPARISON:  No recent. FINDINGS: Angulated display fracture of the distal radial metaphysis is noted. Fracture of the ulnar styloid noted. Diffuse osteopenia. Diffuse degenerative change. IMPRESSION: 1.  Angulated displaced fracture of the distal radius. 2.  Diffuse osteopenia degenerative change pre Electronically Signed   By: Marcello Moores  Register   On: 08/02/2016 10:33   Ct Head Wo Contrast  Result Date: 08/02/2016 CLINICAL DATA:  Fall.  History of meningioma resection 2008 EXAM: CT HEAD WITHOUT CONTRAST TECHNIQUE: Contiguous axial  images were obtained from the base of the skull through the vertex without intravenous contrast. COMPARISON:  CT head 09/20/2012 FINDINGS: Brain: Right occipital craniotomy for resection of tumor. Encephalomalacia right occipital lobe unchanged. No recurrent mass. Atrophy and chronic microvascular ischemia. No acute infarct or hemorrhage. Vascular: Atherosclerotic calcification. Negative for hyperdense vessel Skull: Right occipital craniotomy.  No acute skull lesion. Sinuses/Orbits: Mucosal edema paranasal sinuses. Bilateral lens replacement. Other: None IMPRESSION: No acute intracranial abnormality. Postop tumor resection right occipital lobe, stable from prior studies. Electronically Signed   By: Franchot Gallo M.D.   On: 08/02/2016 10:26   Dg Hand Complete Left  Result Date: 08/02/2016 CLINICAL DATA:  Fall. EXAM: LEFT HAND - COMPLETE 3+ VIEW COMPARISON:  No recent . FINDINGS: Angulated displaced fracture of the distal radial metaphysis. Diffuse osteopenia degenerative change. IMPRESSION: 1. Angulated displaced fracture of the distal radial metaphysis. Slight comminution noted. 2. Diffuse osteopenia degenerative change. Electronically Signed   By: Marcello Moores  Register   On: 08/02/2016 10:34    Procedures Procedures (including critical care time)  Medications Ordered in ED Medications  bupivacaine (MARCAINE) 0.25 % (with pres) injection 10 mL (10 mLs Infiltration Given by Other 08/02/16 1119)  fentaNYL (SUBLIMAZE) injection 50 mcg (50 mcg Intravenous Given 08/02/16 1110)     Initial Impression / Assessment and Plan / ED Course  I have reviewed the triage vital signs and the nursing notes.  Pertinent labs & imaging results that were available during my care of the patient were reviewed by me and considered in my medical decision making (see chart for details).     89yF with L wrist pain after fall. Closed distal radius fx and ulnar styloid fx. NVI. Will discuss with hand surgery.   11:42 AM  Discussed case with Dr. Amedeo Plenty (hand) who advises pt be placed in a splint and discharged to be seen in his office this afernoon.   Final Clinical Impressions(s) / ED Diagnoses   Final diagnoses:  Closed fracture of distal end of left radius, unspecified fracture morphology, initial encounter    New Prescriptions New Prescriptions   No medications on file   I personally preformed the services scribed in my presence. The recorded information has been reviewed is accurate. Virgel Manifold, MD.     Virgel Manifold, MD 08/02/16 [phone]

## 2016-08-02 NOTE — ED Notes (Signed)
Ortho at bedside.

## 2016-08-02 NOTE — ED Notes (Signed)
Pt to xray

## 2016-08-02 NOTE — Discharge Instructions (Signed)
Dr Amedeo Plenty wants to see you in his office this afternoon. Please go over there right after you are discharged from the ED.

## 2016-08-02 NOTE — Progress Notes (Signed)
Orthopedic Tech Progress Note Patient Details:  [name] 1922/07/12 [id number]  Ortho Devices Type of Ortho Device: Short arm splint, Arm sling Ortho Device/Splint Location: applied short arm splint to pt left wrist.  pt tolerated application very well. Daugther at bedside.  provided arm sling for arm support because of pt's age. Ortho Device/Splint Interventions: Application   Kristopher Oppenheim 08/02/2016, 12:30 PM

## 2016-08-04 DIAGNOSIS — M25532 Pain in left wrist: Secondary | ICD-10-CM | POA: Diagnosis not present

## 2016-08-05 DIAGNOSIS — S62102D Fracture of unspecified carpal bone, left wrist, subsequent encounter for fracture with routine healing: Secondary | ICD-10-CM | POA: Diagnosis not present

## 2016-08-08 DIAGNOSIS — S62102D Fracture of unspecified carpal bone, left wrist, subsequent encounter for fracture with routine healing: Secondary | ICD-10-CM | POA: Diagnosis not present

## 2016-08-14 DIAGNOSIS — M25532 Pain in left wrist: Secondary | ICD-10-CM | POA: Diagnosis not present

## 2016-08-19 DIAGNOSIS — I739 Peripheral vascular disease, unspecified: Secondary | ICD-10-CM | POA: Diagnosis not present

## 2016-08-19 DIAGNOSIS — L84 Corns and callosities: Secondary | ICD-10-CM | POA: Diagnosis not present

## 2016-08-19 DIAGNOSIS — L603 Nail dystrophy: Secondary | ICD-10-CM | POA: Diagnosis not present

## 2016-08-22 DIAGNOSIS — M25532 Pain in left wrist: Secondary | ICD-10-CM | POA: Diagnosis not present

## 2016-08-22 DIAGNOSIS — S62102D Fracture of unspecified carpal bone, left wrist, subsequent encounter for fracture with routine healing: Secondary | ICD-10-CM | POA: Diagnosis not present

## 2016-09-05 DIAGNOSIS — S62102D Fracture of unspecified carpal bone, left wrist, subsequent encounter for fracture with routine healing: Secondary | ICD-10-CM | POA: Diagnosis not present

## 2016-09-05 DIAGNOSIS — M25532 Pain in left wrist: Secondary | ICD-10-CM | POA: Diagnosis not present

## 2016-09-25 ENCOUNTER — Ambulatory Visit (INDEPENDENT_AMBULATORY_CARE_PROVIDER_SITE_OTHER): Payer: Medicare Other | Admitting: Internal Medicine

## 2016-09-25 ENCOUNTER — Other Ambulatory Visit: Payer: Self-pay

## 2016-09-25 ENCOUNTER — Encounter: Payer: Self-pay | Admitting: Internal Medicine

## 2016-09-25 VITALS — BP 122/68 | HR 70 | Temp 98.4°F | Ht 65.0 in

## 2016-09-25 DIAGNOSIS — R51 Headache: Secondary | ICD-10-CM

## 2016-09-25 DIAGNOSIS — G44209 Tension-type headache, unspecified, not intractable: Secondary | ICD-10-CM

## 2016-09-25 DIAGNOSIS — R110 Nausea: Secondary | ICD-10-CM

## 2016-09-25 DIAGNOSIS — I10 Essential (primary) hypertension: Secondary | ICD-10-CM

## 2016-09-25 DIAGNOSIS — R519 Headache, unspecified: Secondary | ICD-10-CM | POA: Insufficient documentation

## 2016-09-25 MED ORDER — ONDANSETRON HCL 4 MG PO TABS: 4.0000 mg | ORAL_TABLET | Freq: Three times a day (TID) | ORAL | 0 refills | Status: DC | PRN

## 2016-09-25 MED ORDER — HYDROCODONE-ACETAMINOPHEN 5-325 MG PO TABS: 1.0000 | ORAL_TABLET | Freq: Four times a day (QID) | ORAL | 0 refills | Status: DC | PRN

## 2016-09-25 NOTE — Patient Instructions (Signed)
Follow these instructions at home: Watch your condition for any changes. Take these steps to help with your condition: Managing pain  Take over-the-counter and prescription medicines only as told by your health care provider.  Lie down in a dark, quiet room when you have a headache.  If directed, apply ice to the head and neck area: ? Put ice in a plastic bag. ? Place a towel between your skin and the bag. ? Leave the ice on for 20 minutes, 2-3 times per day.  Use a heating pad or hot shower to apply heat to the head and neck area as told by your health care provider.  Keep lights dim if bright lights bother you or make your headaches worse. Eating and drinking  Eat meals on a regular schedule.  Limit alcohol use.  Decrease the amount of caffeine you drink, or stop drinking caffeine. General instructions  Keep all follow-up visits as told by your health care provider. This is important.  Keep a headache journal to help find out what may trigger your headaches. For example, write down: ? What you eat and drink. ? How much sleep you get. ? Any change to your diet or medicines.  Try massage or other relaxation techniques.  Limit stress.  Sit up straight, and do not tense your muscles.  Do not use tobacco products, including cigarettes, chewing tobacco, or e-cigarettes. If you need help quitting, ask your health care provider.  Exercise regularly as told by your health care provider.  Sleep on a regular schedule. Get 7-9 hours of sleep, or the amount recommended by your health care provider. Contact a health care provider if:  Your symptoms are not helped by medicine.  You have a headache that is different from the usual headache.  You have nausea or you vomit.  You have a fever. Get help right away if:  Your headache becomes severe.  You have repeated vomiting.  You have a stiff neck.  You have a loss of vision.  You have problems with speech.  You have  pain in the eye or ear.  You have muscular weakness or loss of muscle control.  You lose your balance or have trouble walking.  You feel faint or pass out.  You have confusion.

## 2016-09-25 NOTE — Progress Notes (Signed)
Subjective:    Patient ID: [name], 83 y.o.    female    DOB: 11-02-1922, 83 y.o.   MRN: [id number]  HPI  83 year old patient who has essential hypertension.  She has remote history of a meningioma status post resection in 2008 She was fairly stable until approximately 3 AM today when she had the onset of headaches associated with nausea.  She has felt unwell.  There is been no fever or neck pain.  She complains of being dizzy and weak. She has had a recent fracture involving the left wrist but has been taking Tylenol only for her headache. No history of recent head trauma  Past Medical History:  Diagnosis Date  . Abnormal CT of the chest    Stable patchy/nodular and ground-glass opacities in the right upper lobe, While grossly unchanged, low grade adenocarcinoma remains possible.  Marland Kitchen ANEMIA, PERNICIOUS 09/10/2006  . Bradycardia    August, 2013  . CORONARY ARTERY DISEASE 11/07/2008   a. NSTEMI 2001 & STEMI 2008 managed medically. b. Takotsubo 06/2006. c. Cath 2009 -> med rx.  . CVA WITH LEFT HEMIPARESIS 07/22/2007   May, 2009, neurology decided aspirin and Plavix at that time.  . DERMATITIS 03/02/2007  . DJD (degenerative joint disease)    L shoulder impingement  . Ejection fraction    EF 60%, echo, July, 2010, (  EF improved after tach a suitable event 2008)  . Family history of anesthesia complication    daughter has a hard time waking up  . H/O hiatal hernia   . HYPERTENSION 07/15/2006  . IBS (irritable bowel syndrome)   . MENINGIOMA 08/27/2006   Occipital meningioma, surgery, [facility], 2008 /  MRI, [facility], June, 2011, no change in lesion, history believe the patient had a gamma knife treatment of a right occipital atypical meningioma  . OSTEOARTHRITIS 07/15/2006  . PULMONARY NODULE 01/14/2007   [facility], December, 2011  . Stroke ([place])   . Subarachnoid hemorrhage following injury ([place]) 2007   Subarachnoid hemorrhage secondary to a fall December, 2007  . Takotsubo syndrome 04/22/2008   MI, May, 2008, question takotsubo event  . THYROID NODULE 01/14/2007     Social History   Social History  . Marital status: Widowed    Spouse name: N/A  . Number of children: 4  . Years of education: N/A   Occupational History  .  Retired   Social History Main Topics  . Smoking status: Never Smoker  . Smokeless tobacco: Never Used  . Alcohol use No  . Drug use: No  . Sexual activity: No   Other Topics Concern  . Not on file   Social History Narrative   Widowed. Lives in [place], [place] with daughter.     Past Surgical History:  Procedure Laterality Date  . ABDOMINAL HYSTERECTOMY    . BRAIN TUMOR EXCISION     Meningioma; treated at [facility] in 2008  . CHOLECYSTECTOMY    . JOINT REPLACEMENT     rt THR  . SHOULDER SURGERY      Family History  Problem Relation Age of Onset  . Heart attack Brother 84    Allergies  Allergen Reactions  . Atorvastatin Other (See Comments)    Raises liver enzymes (takes pravastatin at home)  . Influenza Vaccines Other (See Comments)    Unknown  . Phenytoin Other (See Comments)    Blood clots  . Promethazine Hcl Other (See Comments)    Severe sedation  . Sulfamethoxazole     REACTION: unspecified  Per  pt daughter she stated this causes rash    Current Outpatient Prescriptions on File Prior to Visit  Medication Sig Dispense Refill  . B-D SYRINGE LUER-LOK 3CC 3 ML MISC     . clopidogrel (PLAVIX) 75 MG tablet TAKE 1 TABLET DAILY WITH BREAKFAST 90 tablet 1  . cyanocobalamin (,VITAMIN B-12,) 1000 MCG/ML injection INJECT 1 ML (1000 MCG TOTAL) INTRAMUSCULARLY EVERY 30 DAYS 3 mL 3  . cyclobenzaprine (FLEXERIL) 10 MG tablet TAKE 1 TABLET THREE TIMES A DAY AS NEEDED FOR MUSCLE SPASMS 270 tablet 1  . isosorbide mononitrate (IMDUR) 30 MG 24 hr tablet TAKE ONE-HALF (1/2) TABLET DAILY 90 tablet 3  . meclizine (ANTIVERT) 25 MG tablet Take 1 tablet (25 mg total) by mouth 2 (two) times daily as needed for dizziness. 10 tablet 0  . methimazole  (TAPAZOLE) 5 MG tablet TAKE 1 TABLET THREE TIMES A WEEK, MONDAY, WEDNESDAY, AND FRIDAY 45 tablet 1  . NEEDLE, DISP, 25 G (BD DISP NEEDLES) 25G X 5/8" MISC USE AS DIRECTED ONCE A MONTH FOR B12 INJECTIONS 3 each 4  . nitroGLYCERIN (NITROSTAT) 0.4 MG SL tablet Place 1 tablet (0.4 mg total) under the tongue every 5 (five) minutes as needed. For chest pain. 15 tablet 3  . pravastatin (PRAVACHOL) 20 MG tablet TAKE 1 TABLET DAILY IN THE EVENING 90 tablet 1  . PREMARIN vaginal cream USE 0.5 GRAM VAGINALLY TWICE A WEEK AS NEEDED 90 g 1  . triamcinolone ointment (KENALOG) 0.1 % APPLY 1 APPLICATION TOPICALLY TWICE A DAY FOR DRY SKIN (Patient taking differently: Apply 1 application topically 2 (two) times daily. FOR DRY SKIN) 80 g 1  . [DISCONTINUED] gabapentin (NEURONTIN) 100 MG capsule Take 1 capsule (100 mg total) by mouth at bedtime. (Patient not taking: Reported on 10/20/2014) 90 capsule 2   No current facility-administered medications on file prior to visit.     BP 122/68 (BP Location: Left Arm, Patient Position: Sitting, Cuff Size: Normal)   Pulse 70   Temp 98.4 F (36.9 C) (Oral)   Ht 5\' 5"  (1.651 m)   SpO2 97%     Review of Systems  Constitutional: Positive for activity change, appetite change and fatigue.  HENT: Negative for congestion, dental problem, hearing loss, rhinorrhea, sinus pressure, sore throat and tinnitus.   Eyes: Negative for pain, discharge and visual disturbance.  Respiratory: Negative for cough and shortness of breath.   Cardiovascular: Negative for chest pain, palpitations and leg swelling.  Gastrointestinal: Positive for nausea. Negative for abdominal distention, abdominal pain, blood in stool, constipation, diarrhea and vomiting.  Genitourinary: Negative for difficulty urinating, dysuria, flank pain, frequency, hematuria, pelvic pain, urgency, vaginal bleeding, vaginal discharge and vaginal pain.  Musculoskeletal: Negative for arthralgias, gait problem and joint swelling.   Skin: Negative for rash.  Neurological: Positive for weakness and headaches. Negative for dizziness, syncope, speech difficulty and numbness.  Hematological: Negative for adenopathy.  Psychiatric/Behavioral: Negative for agitation, behavioral problems and dysphoric mood. The patient is not nervous/anxious.        Objective:   Physical Exam  Constitutional: She is oriented to person, place, and time. She appears well-developed and well-nourished.  Appears unwell, but in no acute distress Alert Able to give a history  Blood pressure well controlled Pulse 70  HENT:  Head: Normocephalic.  Right Ear: External ear normal.  Left Ear: External ear normal.  Mouth/Throat: Oropharynx is clear and moist.  Eyes: Pupils are equal, round, and reactive to light. Conjunctivae and EOM are normal.  Neck: Normal range of motion. Neck supple. No thyromegaly present.  Cardiovascular: Normal rate, regular rhythm, normal heart sounds and intact distal pulses.   Pulmonary/Chest: Effort normal and breath sounds normal.  Abdominal: Soft. Bowel sounds are normal. She exhibits no mass. There is no tenderness.  Musculoskeletal: Normal range of motion.  Left lower arm in a brace  Lymphadenopathy:    She has no cervical adenopathy.  Neurological: She is alert and oriented to person, place, and time. No cranial nerve deficit.  Able to raise both arms and legs and hold against gravity  Skin: Skin is warm and dry. No rash noted.  Psychiatric: She has a normal mood and affect. Her behavior is normal.          Assessment & Plan:   Onset headache associated with nausea Unremarkable neuro exam  Options discussed including referral to the ED.  Have elected to treat symptomatically and observe.  In view of normal.  Clinical exam.  If she develops new symptoms or fails to improve.  Patient will be evaluated in the emergency department.  Nyoka Cowden

## 2016-09-27 ENCOUNTER — Ambulatory Visit (INDEPENDENT_AMBULATORY_CARE_PROVIDER_SITE_OTHER): Payer: Medicare Other | Admitting: Orthopaedic Surgery

## 2016-09-27 ENCOUNTER — Ambulatory Visit (INDEPENDENT_AMBULATORY_CARE_PROVIDER_SITE_OTHER): Payer: Self-pay

## 2016-09-27 ENCOUNTER — Encounter (INDEPENDENT_AMBULATORY_CARE_PROVIDER_SITE_OTHER): Payer: Self-pay | Admitting: Orthopaedic Surgery

## 2016-09-27 VITALS — Ht 64.0 in | Wt 119.0 lb

## 2016-09-27 DIAGNOSIS — M25551 Pain in right hip: Secondary | ICD-10-CM

## 2016-09-27 DIAGNOSIS — S62102D Fracture of unspecified carpal bone, left wrist, subsequent encounter for fracture with routine healing: Secondary | ICD-10-CM | POA: Diagnosis not present

## 2016-09-27 NOTE — Progress Notes (Signed)
Office Visit Note   Patient: [name]           Date of Birth: [birth year]           MRN: [id number] Visit Date: 09/27/2016              Requested by: Marletta Lor, MD [place], [place] [address] PCP: Marletta Lor, MD   Assessment & Plan: Visit Diagnoses:  1. Pain in right hip          Right hip contusion secondary to fall negative for acute fracture. Plan: patient  fell on her buttocks and has a contusion of her right buttocks. Vision negative for fracture conservative treatment recommended encouraged her to make sure she uses her walker especially since she's had a wrist fracture recently. Return when necessary  Follow-Up Instructions: Return if symptoms worsen or fail to improve.   Orders:  Orders Placed This Encounter  Procedures  . XR HIP UNILAT W OR W/O PELVIS 2-3 VIEWS RIGHT   No orders of the defined types were placed in this encounter.     Procedures: No procedures performed   Clinical Data: No additional findings.   Subjective: Chief Complaint  Patient presents with  . Right Hip - Pain    HPI patient got up wasn't using her walker she's had a left wrist fracture treated by Dr. Demetria Pore and she fell landing on her right buttocks. She's had pain difficulty ambulating since that time with pain with weightbearing. She did not have any ecchymosis. She is in a wheelchair currently. Follow her yesterday and she is here with her daughter  Review of Systems 14 point ROS systems is updated as unchanged from last office visit. Of note his past history of CVA recent wrist fracture on the left treated by Dr. Phillip Heal make history of hypertension, total hip arthroplasty on the right, fall history.   Objective: Vital Signs: Ht 5\' 4"  (1.626 m)   Wt 119 lb (54 kg)   BMI 20.43 kg/m   Physical Exam  Constitutional: She is oriented to person, place, and time. She appears well-developed.  HENT:  Head: Normocephalic.  Right Ear:  External ear normal.  Left Ear: External ear normal.  Eyes: Pupils are equal, round, and reactive to light.  Neck: No tracheal deviation present. No thyromegaly present.  Cardiovascular: Normal rate.   Pulmonary/Chest: Effort normal.  Abdominal: Soft.  Musculoskeletal:  Patient has tenderness over the right buttocks. Range of motion is good. She has some pain with attempted weightbearing. No rash over exposed skin patient's alert no trauma to the head. Left the wrist splint was on at the time of her fall to protect her wrist which had surgery.  Neurological: She is alert and oriented to person, place, and time.  Skin: Skin is warm and dry.  Psychiatric: She has a normal mood and affect. Her behavior is normal.    Ortho Exam  Specialty Comments:  No specialty comments available.  Imaging: Xr Hip Unilat W Or W/o Pelvis 2-3 Views Right  Result Date: 09/27/2016 Two-view x-rays of the pelvis with frog leg right hip obtained. This shows cemented femoral stem without loosening or subsidence. Pelvis is negative for x-rays acetabulum press-fit is in good position. Impression: Post total hip arthroplasty hybrid without evidence of loosening. Negative for acute fracture of the pelvis after her fall    PMFS History: Patient Active Problem List   Diagnosis Date Noted  . Headache 09/25/2016  .  History of stroke 11/15/2014  . History of meningioma 11/15/2014  . GERD (gastroesophageal reflux disease) 11/19/2011  . Dysphagia 11/18/2011  . Palpitations 11/17/2011  . Encounter for long-term (current) use of other medications 10/29/2011  . Hyperthyroidism 10/14/2011  . Vertigo 10/14/2011  . Bradycardia   . Subarachnoid hemorrhage following injury ([place])   . Hyperlipidemia 03/05/2011  . Takotsubo syndrome 04/22/2008  . Hemiplegia, late effect of cerebrovascular disease ([place]) 07/22/2007  . THYROID NODULE 01/14/2007  . PULMONARY NODULE 01/14/2007  . MENINGIOMA 08/27/2006  . Essential  hypertension 07/15/2006  . Osteoarthritis 07/15/2006   Past Medical History:  Diagnosis Date  . Abnormal CT of the chest    Stable patchy/nodular and ground-glass opacities in the right upper lobe, While grossly unchanged, low grade adenocarcinoma remains possible.  Marland Kitchen ANEMIA, PERNICIOUS 09/10/2006  . Bradycardia    August, 2013  . CORONARY ARTERY DISEASE 11/07/2008   a. NSTEMI 2001 & STEMI 2008 managed medically. b. Takotsubo 06/2006. c. Cath 2009 -> med rx.  . CVA WITH LEFT HEMIPARESIS 07/22/2007   May, 2009, neurology decided aspirin and Plavix at that time.  . DERMATITIS 03/02/2007  . DJD (degenerative joint disease)    L shoulder impingement  . Ejection fraction    EF 60%, echo, July, 2010, (  EF improved after tach a suitable event 2008)  . Family history of anesthesia complication    daughter has a hard time waking up  . H/O hiatal hernia   . HYPERTENSION 07/15/2006  . IBS (irritable bowel syndrome)   . MENINGIOMA 08/27/2006   Occipital meningioma, surgery, [facility], 2008 /  MRI, [place], June, 2011, no change in lesion, history believe the patient had a gamma knife treatment of a right occipital atypical meningioma  . OSTEOARTHRITIS 07/15/2006  . PULMONARY NODULE 01/14/2007   [place], December, 2011  . Stroke ([place])   . Subarachnoid hemorrhage following injury ([place]) 2007   Subarachnoid hemorrhage secondary to a fall December, 2007  . Takotsubo syndrome 04/22/2008   MI, May, 2008, question takotsubo event  . THYROID NODULE 01/14/2007    Family History  Problem Relation Age of Onset  . Heart attack Brother 41    Past Surgical History:  Procedure Laterality Date  . ABDOMINAL HYSTERECTOMY    . BRAIN TUMOR EXCISION     Meningioma; treated at [facility] in 2008  . CHOLECYSTECTOMY    . JOINT REPLACEMENT     rt THR  . SHOULDER SURGERY     Social History   Occupational History  .  Retired   Social History Main Topics  . Smoking status: Never Smoker  . Smokeless tobacco: Never  Used  . Alcohol use No  . Drug use: No  . Sexual activity: No

## 2016-10-09 ENCOUNTER — Other Ambulatory Visit: Payer: Self-pay | Admitting: Endocrinology

## 2016-10-21 ENCOUNTER — Encounter: Payer: Self-pay | Admitting: Internal Medicine

## 2016-10-21 ENCOUNTER — Telehealth: Payer: Self-pay | Admitting: Internal Medicine

## 2016-10-21 NOTE — Telephone Encounter (Signed)
ok 

## 2016-10-21 NOTE — Telephone Encounter (Signed)
Okay for patient to pick up letter tomorrow

## 2016-10-21 NOTE — Telephone Encounter (Signed)
Pts daughter Warnell Bureau) is calling to see if Dr. Raliegh Ip would write a note to the [facility] to excuse her from jury duty due to her being the caregiver of the pt.  The juror# [id number], Panel# [id number] Mailing Address: [address] [place] [address] pts daughter need to have this letter not later than 10/22/16 at noon.  Pt would like to pick up the letter tomorrow.

## 2016-10-21 NOTE — Telephone Encounter (Signed)
Please advise 

## 2016-10-22 ENCOUNTER — Ambulatory Visit (INDEPENDENT_AMBULATORY_CARE_PROVIDER_SITE_OTHER): Payer: Medicare Other | Admitting: Endocrinology

## 2016-10-22 ENCOUNTER — Encounter: Payer: Self-pay | Admitting: Endocrinology

## 2016-10-22 VITALS — BP 120/70 | HR 61 | Wt 117.2 lb

## 2016-10-22 DIAGNOSIS — E0590 Thyrotoxicosis, unspecified without thyrotoxic crisis or storm: Secondary | ICD-10-CM

## 2016-10-22 LAB — TSH: TSH: 1.24 u[IU]/mL (ref 0.35–4.50)

## 2016-10-22 NOTE — Patient Instructions (Addendum)
blood tests are requested for you today.  We'll let you know about the result.  if ever you have fever while taking methimazole, stop it and call us, because of the risk of a rare side-effect.   Please come back for a follow-up appointment in 6 months.

## 2016-10-22 NOTE — Progress Notes (Signed)
Subjective:    Patient ID: [name], female    DOB: 12-19-1922, 83 y.o.   MRN: [id number]  HPI Pt returns for f/u of hyperthyroidism (dx'ed 2013; she was started on tapazole, as she can't be isolated due to frail elderly state; she was also noted to have a thyroid nodule, but w/u was felt not to be worthwhile, due to her advanced age and health problems).  She has generalized weakness.  Past Medical History:  Diagnosis Date  . Abnormal CT of the chest    Stable patchy/nodular and ground-glass opacities in the right upper lobe, While grossly unchanged, low grade adenocarcinoma remains possible.  Marland Kitchen ANEMIA, PERNICIOUS 09/10/2006  . Bradycardia    August, 2013  . CORONARY ARTERY DISEASE 11/07/2008   a. NSTEMI 2001 & STEMI 2008 managed medically. b. Takotsubo 06/2006. c. Cath 2009 -> med rx.  . CVA WITH LEFT HEMIPARESIS 07/22/2007   May, 2009, neurology decided aspirin and Plavix at that time.  . DERMATITIS 03/02/2007  . DJD (degenerative joint disease)    L shoulder impingement  . Ejection fraction    EF 60%, echo, July, 2010, (  EF improved after tach a suitable event 2008)  . Family history of anesthesia complication    daughter has a hard time waking up  . H/O hiatal hernia   . HYPERTENSION 07/15/2006  . IBS (irritable bowel syndrome)   . MENINGIOMA 08/27/2006   Occipital meningioma, surgery, [facility], 2008 /  MRI, [place], June, 2011, no change in lesion, history believe the patient had a gamma knife treatment of a right occipital atypical meningioma  . OSTEOARTHRITIS 07/15/2006  . PULMONARY NODULE 01/14/2007   [place], December, 2011  . Stroke ([place])   . Subarachnoid hemorrhage following injury ([place]) 2007   Subarachnoid hemorrhage secondary to a fall December, 2007  . Takotsubo syndrome 04/22/2008   MI, May, 2008, question takotsubo event  . THYROID NODULE 01/14/2007    Past Surgical History:  Procedure Laterality Date  . ABDOMINAL HYSTERECTOMY    . BRAIN TUMOR EXCISION     Meningioma; [facility] in [facility]  . CHOLECYSTECTOMY    . JOINT REPLACEMENT     rt THR  . SHOULDER SURGERY      Social History   Social History  . Marital status: Widowed    Spouse name: N/A  . Number of children: 4  . Years of education: N/A   Occupational History  .  Retired   Social History Main Topics  . Smoking status: Never Smoker  . Smokeless tobacco: Never Used  . Alcohol use No  . Drug use: No  . Sexual activity: No   Other Topics Concern  . Not on file   Social History Narrative   Widowed. Lives in [place], [place] with daughter.     Current Outpatient Prescriptions on File Prior to Visit  Medication Sig Dispense Refill  . B-D SYRINGE LUER-LOK 3CC 3 ML MISC     . clopidogrel (PLAVIX) 75 MG tablet TAKE 1 TABLET DAILY WITH BREAKFAST 90 tablet 1  . cyanocobalamin (,VITAMIN B-12,) 1000 MCG/ML injection INJECT 1 ML (1000 MCG TOTAL) INTRAMUSCULARLY EVERY 30 DAYS 3 mL 3  . cyclobenzaprine (FLEXERIL) 10 MG tablet TAKE 1 TABLET THREE TIMES A DAY AS NEEDED FOR MUSCLE SPASMS 270 tablet 1  . HYDROcodone-acetaminophen (NORCO/VICODIN) 5-325 MG tablet Take 1 tablet by mouth every 6 (six) hours as needed for moderate pain. 30 tablet 0  . isosorbide mononitrate (IMDUR) 30 MG 24  hr tablet TAKE ONE-HALF (1/2) TABLET DAILY 90 tablet 3  . meclizine (ANTIVERT) 25 MG tablet Take 1 tablet (25 mg total) by mouth 2 (two) times daily as needed for dizziness. 10 tablet 0  . methimazole (TAPAZOLE) 5 MG tablet TAKE 1 TABLET THREE TIMES A WEEK, MONDAY, WEDNESDAY, AND FRIDAY 45 tablet 1  . NEEDLE, DISP, 25 G (BD DISP NEEDLES) 25G X 5/8" MISC USE AS DIRECTED ONCE A MONTH FOR B12 INJECTIONS 3 each 4  . nitroGLYCERIN (NITROSTAT) 0.4 MG SL tablet Place 1 tablet (0.4 mg total) under the tongue every 5 (five) minutes as needed. For chest pain. 15 tablet 3  . ondansetron (ZOFRAN) 4 MG tablet Take 1 tablet (4 mg total) by mouth every 8 (eight) hours as needed for nausea or vomiting. 20 tablet 0  .  pravastatin (PRAVACHOL) 20 MG tablet TAKE 1 TABLET DAILY IN THE EVENING 90 tablet 1  . PREMARIN vaginal cream USE 0.5 GRAM VAGINALLY TWICE A WEEK AS NEEDED 90 g 1  . triamcinolone ointment (KENALOG) 0.1 % APPLY 1 APPLICATION TOPICALLY TWICE A DAY FOR DRY SKIN (Patient taking differently: Apply 1 application topically 2 (two) times daily. FOR DRY SKIN) 80 g 1  . [DISCONTINUED] gabapentin (NEURONTIN) 100 MG capsule Take 1 capsule (100 mg total) by mouth at bedtime. (Patient not taking: Reported on 10/20/2014) 90 capsule 2   No current facility-administered medications on file prior to visit.     Allergies  Allergen Reactions  . Atorvastatin Other (See Comments)    Raises liver enzymes (takes pravastatin at home)  . Influenza Vaccines Other (See Comments)    Unknown  . Phenytoin Other (See Comments)    Blood clots  . Promethazine Hcl Other (See Comments)    Severe sedation  . Sulfamethoxazole     REACTION: unspecified  Per pt daughter she stated this causes rash    Family History  Problem Relation Age of Onset  . Heart attack Brother 84    BP 120/70   Pulse 61   Wt 117 lb 3.2 oz (53.2 kg)   SpO2 95%   BMI 20.12 kg/m    Review of Systems Denies fever    Objective:   Physical Exam Vital signs: see vs page.   Gen: elderly, frail, no distress.  NECK: There is no palpable thyroid enlargement overall, but the left thyroid nodule is palpable again.  No palpable lymphadenopathy at the anterior neck.       Assessment & Plan:  Hyperthyroidism: due for recheck. Patient Instructions  blood tests are requested for you today.  We'll let you know about the result.  if ever you have fever while taking methimazole, stop it and call us, because of the risk of a rare side-effect.   Please come back for a follow-up appointment in 6 months.

## 2016-10-22 NOTE — Telephone Encounter (Signed)
Letter was place in out going mail this morning.

## 2016-10-24 ENCOUNTER — Other Ambulatory Visit: Payer: Self-pay | Admitting: Internal Medicine

## 2016-10-24 ENCOUNTER — Encounter: Payer: Self-pay | Admitting: Internal Medicine

## 2016-10-28 DIAGNOSIS — S62102D Fracture of unspecified carpal bone, left wrist, subsequent encounter for fracture with routine healing: Secondary | ICD-10-CM | POA: Diagnosis not present

## 2016-10-30 DIAGNOSIS — H26492 Other secondary cataract, left eye: Secondary | ICD-10-CM | POA: Diagnosis not present

## 2016-11-04 ENCOUNTER — Encounter: Payer: Self-pay | Admitting: Internal Medicine

## 2016-11-04 ENCOUNTER — Ambulatory Visit (INDEPENDENT_AMBULATORY_CARE_PROVIDER_SITE_OTHER): Payer: Medicare Other | Admitting: Internal Medicine

## 2016-11-04 VITALS — BP 122/64 | HR 62 | Temp 97.6°F | Ht 65.0 in | Wt 116.2 lb

## 2016-11-04 DIAGNOSIS — Z8673 Personal history of transient ischemic attack (TIA), and cerebral infarction without residual deficits: Secondary | ICD-10-CM | POA: Diagnosis not present

## 2016-11-04 DIAGNOSIS — I10 Essential (primary) hypertension: Secondary | ICD-10-CM | POA: Diagnosis not present

## 2016-11-04 DIAGNOSIS — M150 Primary generalized (osteo)arthritis: Secondary | ICD-10-CM | POA: Diagnosis not present

## 2016-11-04 DIAGNOSIS — M159 Polyosteoarthritis, unspecified: Secondary | ICD-10-CM

## 2016-11-04 DIAGNOSIS — K219 Gastro-esophageal reflux disease without esophagitis: Secondary | ICD-10-CM | POA: Diagnosis not present

## 2016-11-04 DIAGNOSIS — E785 Hyperlipidemia, unspecified: Secondary | ICD-10-CM

## 2016-11-04 DIAGNOSIS — M8949 Other hypertrophic osteoarthropathy, multiple sites: Secondary | ICD-10-CM

## 2016-11-04 LAB — COMPREHENSIVE METABOLIC PANEL
ALBUMIN: 4 g/dL (ref 3.5–5.2)
ALK PHOS: 91 U/L (ref 39–117)
ALT: 7 U/L (ref 0–35)
AST: 13 U/L (ref 0–37)
BILIRUBIN TOTAL: 0.5 mg/dL (ref 0.2–1.2)
BUN: 17 mg/dL (ref 6–23)
CO2: 28 mEq/L (ref 19–32)
CREATININE: 0.96 mg/dL (ref 0.40–1.20)
Calcium: 9.5 mg/dL (ref 8.4–10.5)
Chloride: 104 mEq/L (ref 96–112)
GFR: 57.68 mL/min — ABNORMAL LOW (ref >60.00)
Glucose, Bld: 115 mg/dL — ABNORMAL HIGH (ref 70–99)
Potassium: 4.1 mEq/L (ref 3.5–5.1)
SODIUM: 141 meq/L (ref 135–145)
TOTAL PROTEIN: 6.2 g/dL (ref 6.0–8.3)

## 2016-11-04 LAB — LIPID PANEL
CHOLESTEROL: 172 mg/dL (ref 0–200)
HDL: 65 mg/dL (ref >39.00)
LDL CALC: 85 mg/dL (ref 0–99)
NonHDL: 107.29
TRIGLYCERIDES: 109 mg/dL (ref 0.0–149.0)
Total CHOL/HDL Ratio: 3
VLDL: 21.8 mg/dL (ref 0.0–40.0)

## 2016-11-04 LAB — CBC WITH DIFFERENTIAL/PLATELET
Basophils Absolute: 0.1 10*3/uL (ref 0.0–0.1)
Basophils Relative: 0.9 % (ref 0.0–3.0)
EOS ABS: 0.2 10*3/uL (ref 0.0–0.7)
Eosinophils Relative: 3.2 % (ref 0.0–5.0)
HCT: 38.3 % (ref 36.0–46.0)
HEMOGLOBIN: 12.3 g/dL (ref 12.0–15.0)
Lymphocytes Relative: 22.5 % (ref 12.0–46.0)
Lymphs Abs: 1.7 10*3/uL (ref 0.7–4.0)
MCHC: 32.1 g/dL (ref 30.0–36.0)
MCV: 92.3 fl (ref 78.0–100.0)
MONO ABS: 0.8 10*3/uL (ref 0.1–1.0)
Monocytes Relative: 10.2 % (ref 3.0–12.0)
Neutro Abs: 4.8 10*3/uL (ref 1.4–7.7)
Neutrophils Relative %: 63.2 % (ref 43.0–77.0)
Platelets: 243 10*3/uL (ref 150.0–400.0)
RBC: 4.15 Mil/uL (ref 3.87–5.11)
RDW: 14.3 % (ref 11.5–15.5)
WBC: 7.6 10*3/uL (ref 4.0–10.5)

## 2016-11-04 NOTE — Progress Notes (Signed)
Subjective:    Patient ID: [name], female    DOB: 06-25-1922, 83 y.o.   MRN: [id number]  HPI  83 year old patient who is seen today for her six-month follow-up. She has been seen by endocrinology recently due to hyperthyroidism. TSH was therapeutic. She has a history of cerebrovascular disease and prior meningioma.  She complains of some mild left chest wall tenderness.  3 months ago, she fractured her left wrist. She is accompanied by her daughter.  She does have a history of B12 deficiency and remains on monthly injections.  Past Medical History:  Diagnosis Date  . Abnormal CT of the chest    Stable patchy/nodular and ground-glass opacities in the right upper lobe, While grossly unchanged, low grade adenocarcinoma remains possible.  Marland Kitchen ANEMIA, PERNICIOUS 09/10/2006  . Bradycardia    August, 2013  . CORONARY ARTERY DISEASE 11/07/2008   a. NSTEMI 2001 & STEMI 2008 managed medically. b. Takotsubo 06/2006. c. Cath 2009 -> med rx.  . CVA WITH LEFT HEMIPARESIS 07/22/2007   May, 2009, neurology decided aspirin and Plavix at that time.  . DERMATITIS 03/02/2007  . DJD (degenerative joint disease)    L shoulder impingement  . Ejection fraction    EF 60%, echo, July, 2010, (  EF improved after tach a suitable event 2008)  . Family history of anesthesia complication    daughter has a hard time waking up  . H/O hiatal hernia   . HYPERTENSION 07/15/2006  . IBS (irritable bowel syndrome)   . MENINGIOMA 08/27/2006   Occipital meningioma, surgery, [facility], 2008 /  MRI, [place], June, 2011, no change in lesion, history believe the patient had a gamma knife treatment of a right occipital atypical meningioma  . OSTEOARTHRITIS 07/15/2006  . PULMONARY NODULE 01/14/2007   [place], December, 2011  . Stroke ([place])   . Subarachnoid hemorrhage following injury ([place]) 2007   Subarachnoid hemorrhage secondary to a fall December, 2007  . Takotsubo syndrome 04/22/2008   MI, May, 2008, question takotsubo  event  . THYROID NODULE 01/14/2007     Social History   Social History  . Marital status: Widowed    Spouse name: N/A  . Number of children: 4  . Years of education: N/A   Occupational History  .  Retired   Social History Main Topics  . Smoking status: Never Smoker  . Smokeless tobacco: Never Used  . Alcohol use No  . Drug use: No  . Sexual activity: No   Other Topics Concern  . Not on file   Social History Narrative   Widowed. Lives in [place], [place] with daughter.     Past Surgical History:  Procedure Laterality Date  . ABDOMINAL HYSTERECTOMY    . BRAIN TUMOR EXCISION     Meningioma; treated at [facility] in 2008  . CHOLECYSTECTOMY    . JOINT REPLACEMENT     rt THR  . SHOULDER SURGERY      Family History  Problem Relation Age of Onset  . Heart attack Brother 84    Allergies  Allergen Reactions  . Atorvastatin Other (See Comments)    Raises liver enzymes (takes pravastatin at home)  . Influenza Vaccines Other (See Comments)    Unknown  . Phenytoin Other (See Comments)    Blood clots  . Promethazine Hcl Other (See Comments)    Severe sedation  . Sulfamethoxazole     REACTION: unspecified  Per pt daughter she stated this causes rash    Current Outpatient  Prescriptions on File Prior to Visit  Medication Sig Dispense Refill  . B-D SYRINGE LUER-LOK 3CC 3 ML MISC USE AS DIRECTED ONCE A MONTH FOR B12 INJECTIONS 3 each 4  . clopidogrel (PLAVIX) 75 MG tablet TAKE 1 TABLET DAILY WITH BREAKFAST 90 tablet 1  . cyanocobalamin (,VITAMIN B-12,) 1000 MCG/ML injection INJECT 1 ML (1000 MCG TOTAL) INTRAMUSCULARLY EVERY 30 DAYS 3 mL 3  . isosorbide mononitrate (IMDUR) 30 MG 24 hr tablet TAKE ONE-HALF (1/2) TABLET DAILY 90 tablet 3  . methimazole (TAPAZOLE) 5 MG tablet TAKE 1 TABLET THREE TIMES A WEEK, MONDAY, WEDNESDAY, AND FRIDAY 45 tablet 1  . NEEDLE, DISP, 25 G (BD DISP NEEDLES) 25G X 5/8" MISC USE AS DIRECTED ONCE A MONTH FOR B12 INJECTIONS 3 each 4  .  nitroGLYCERIN (NITROSTAT) 0.4 MG SL tablet Place 1 tablet (0.4 mg total) under the tongue every 5 (five) minutes as needed. For chest pain. 15 tablet 3  . pravastatin (PRAVACHOL) 20 MG tablet TAKE 1 TABLET DAILY IN THE EVENING 90 tablet 1  . PREMARIN vaginal cream USE 0.5 GRAM VAGINALLY TWICE A WEEK AS NEEDED 90 g 1  . [DISCONTINUED] gabapentin (NEURONTIN) 100 MG capsule Take 1 capsule (100 mg total) by mouth at bedtime. (Patient not taking: Reported on 10/20/2014) 90 capsule 2   No current facility-administered medications on file prior to visit.     BP 122/64 (BP Location: Left Arm, Patient Position: Sitting, Cuff Size: Normal)   Pulse 62   Temp 97.6 F (36.4 C) (Oral)   Ht 5\' 5"  (1.651 m)   Wt 116 lb 3.2 oz (52.7 kg)   SpO2 98%   BMI 19.34 kg/m    Review of Systems  Constitutional: Positive for fatigue.  HENT: Negative for congestion, dental problem, hearing loss, rhinorrhea, sinus pressure, sore throat and tinnitus.   Eyes: Negative for pain, discharge and visual disturbance.  Respiratory: Negative for cough and shortness of breath.   Cardiovascular: Positive for chest pain. Negative for palpitations and leg swelling.  Gastrointestinal: Negative for abdominal distention, abdominal pain, blood in stool, constipation, diarrhea, nausea and vomiting.  Genitourinary: Negative for difficulty urinating, dysuria, flank pain, frequency, hematuria, pelvic pain, urgency, vaginal bleeding, vaginal discharge and vaginal pain.  Musculoskeletal: Negative for arthralgias, gait problem and joint swelling.  Skin: Negative for rash.  Neurological: Positive for weakness. Negative for dizziness, syncope, speech difficulty, numbness and headaches.  Hematological: Negative for adenopathy.  Psychiatric/Behavioral: Negative for agitation, behavioral problems and dysphoric mood. The patient is not nervous/anxious.        Objective:   Physical Exam  Constitutional: She is oriented to person, place, and  time. She appears well-developed and well-nourished.  Blood pressure 120/64  HENT:  Head: Normocephalic.  Right Ear: External ear normal.  Left Ear: External ear normal.  Mouth/Throat: Oropharynx is clear and moist.  Eyes: Pupils are equal, round, and reactive to light. Conjunctivae and EOM are normal.  Neck: Normal range of motion. Neck supple. No thyromegaly present.  Cardiovascular: Normal rate, regular rhythm, normal heart sounds and intact distal pulses.   Occasional ectopics  Pulmonary/Chest: Effort normal and breath sounds normal.  Abdominal: Soft. Bowel sounds are normal. She exhibits no mass. There is no tenderness.  Musculoskeletal: Normal range of motion.  Left arm in a soft brace  Lymphadenopathy:    She has no cervical adenopathy.  Neurological: She is alert and oriented to person, place, and time.  Skin: Skin is warm and dry. No rash noted.  Psychiatric: She has a normal mood and affect. Her behavior is normal.          Assessment & Plan:   History of cerebral vascular disease, stable.  We'll continue antiplatelet therapy Hyperthyroidism.  Will continue  TapazoleVitamin B12 deficiency.  Continue supplements Cerebrovascular disease.  Continue statin therapy  Flu vaccine, declined Follow-up 6 months  Deerica Waszak Pilar Plate

## 2016-11-04 NOTE — Patient Instructions (Signed)
Limit your sodium (Salt) intake    It is important that you exercise regularly, at least 20 minutes 3 to 4 times per week.  If you develop chest pain or shortness of breath seek  medical attention.  Return in 6 months for follow-up  

## 2016-11-18 DIAGNOSIS — L603 Nail dystrophy: Secondary | ICD-10-CM | POA: Diagnosis not present

## 2016-11-18 DIAGNOSIS — L84 Corns and callosities: Secondary | ICD-10-CM | POA: Diagnosis not present

## 2016-11-18 DIAGNOSIS — I739 Peripheral vascular disease, unspecified: Secondary | ICD-10-CM | POA: Diagnosis not present

## 2016-11-28 ENCOUNTER — Telehealth: Payer: Self-pay | Admitting: Internal Medicine

## 2016-11-28 MED ORDER — SYRINGE (DISPOSABLE) 3 ML MISC: 5 refills | Status: DC

## 2016-11-28 NOTE — Telephone Encounter (Signed)
Patient states she never received her syringes but received her needles. She states the refill authorization for syringes was never sent in back in. She states that the number below is a number that can be used to verbally authorize a refill request over the phone.   [phone]

## 2016-11-29 DIAGNOSIS — R42 Dizziness and giddiness: Secondary | ICD-10-CM | POA: Diagnosis not present

## 2016-11-29 DIAGNOSIS — I2510 Atherosclerotic heart disease of native coronary artery without angina pectoris: Secondary | ICD-10-CM | POA: Diagnosis not present

## 2016-11-29 DIAGNOSIS — I119 Hypertensive heart disease without heart failure: Secondary | ICD-10-CM | POA: Diagnosis not present

## 2016-11-29 DIAGNOSIS — E0590 Thyrotoxicosis, unspecified without thyrotoxic crisis or storm: Secondary | ICD-10-CM | POA: Diagnosis not present

## 2016-11-29 NOTE — Telephone Encounter (Signed)
Spoke with pharmacy and syringes were ordered.

## 2017-01-06 ENCOUNTER — Other Ambulatory Visit: Payer: Self-pay | Admitting: Internal Medicine

## 2017-01-07 ENCOUNTER — Other Ambulatory Visit: Payer: Self-pay | Admitting: Internal Medicine

## 2017-02-17 DIAGNOSIS — M84475A Pathological fracture, left foot, initial encounter for fracture: Secondary | ICD-10-CM | POA: Diagnosis not present

## 2017-02-17 DIAGNOSIS — I739 Peripheral vascular disease, unspecified: Secondary | ICD-10-CM | POA: Diagnosis not present

## 2017-02-17 DIAGNOSIS — L84 Corns and callosities: Secondary | ICD-10-CM | POA: Diagnosis not present

## 2017-02-17 DIAGNOSIS — L603 Nail dystrophy: Secondary | ICD-10-CM | POA: Diagnosis not present

## 2017-03-03 DIAGNOSIS — M84475D Pathological fracture, left foot, subsequent encounter for fracture with routine healing: Secondary | ICD-10-CM | POA: Diagnosis not present

## 2017-03-17 DIAGNOSIS — M84475D Pathological fracture, left foot, subsequent encounter for fracture with routine healing: Secondary | ICD-10-CM | POA: Diagnosis not present

## 2017-04-07 ENCOUNTER — Other Ambulatory Visit: Payer: Self-pay | Admitting: Endocrinology

## 2017-04-21 ENCOUNTER — Ambulatory Visit (INDEPENDENT_AMBULATORY_CARE_PROVIDER_SITE_OTHER): Payer: Medicare Other | Admitting: Endocrinology

## 2017-04-21 ENCOUNTER — Encounter: Payer: Self-pay | Admitting: Endocrinology

## 2017-04-21 VITALS — BP 112/72 | HR 56 | Ht 65.0 in | Wt 110.0 lb

## 2017-04-21 DIAGNOSIS — E0590 Thyrotoxicosis, unspecified without thyrotoxic crisis or storm: Secondary | ICD-10-CM | POA: Diagnosis not present

## 2017-04-21 LAB — T4, FREE: FREE T4: 0.95 ng/dL (ref 0.60–1.60)

## 2017-04-21 LAB — TSH: TSH: 2.33 u[IU]/mL (ref 0.35–4.50)

## 2017-04-21 NOTE — Progress Notes (Signed)
Subjective:    Patient ID: [name], female    DOB: May 13, 1922, 84 y.o.   MRN: [id number]  HPI Pt returns for f/u of hyperthyroidism (dx'ed 2013; she was started on tapazole, as she can't be isolated due to frail elderly state; she was also noted to have a thyroid nodule, but w/u was felt not to be worthwhile, due to her advanced age and health problems).  She has lost a few lbs.  She takes tapazole as rx'ed Past Medical History:  Diagnosis Date  . Abnormal CT of the chest    Stable patchy/nodular and ground-glass opacities in the right upper lobe, While grossly unchanged, low grade adenocarcinoma remains possible.  Marland Kitchen ANEMIA, PERNICIOUS 09/10/2006  . Bradycardia    August, 2013  . CORONARY ARTERY DISEASE 11/07/2008   a. NSTEMI 2001 & STEMI 2008 managed medically. b. Takotsubo 06/2006. c. Cath 2009 -> med rx.  . CVA WITH LEFT HEMIPARESIS 07/22/2007   May, 2009, neurology decided aspirin and Plavix at that time.  . DERMATITIS 03/02/2007  . DJD (degenerative joint disease)    L shoulder impingement  . Ejection fraction    EF 60%, echo, July, 2010, (  EF improved after tach a suitable event 2008)  . Family history of anesthesia complication    daughter has a hard time waking up  . H/O hiatal hernia   . HYPERTENSION 07/15/2006  . IBS (irritable bowel syndrome)   . MENINGIOMA 08/27/2006   Occipital meningioma, surgery, [facility], 2008 /  MRI, [facility], June, 2011, no change in lesion, history believe the patient had a gamma knife treatment of a right occipital atypical meningioma  . OSTEOARTHRITIS 07/15/2006  . PULMONARY NODULE 01/14/2007   [facility], December, 2011  . Stroke ([place])   . Subarachnoid hemorrhage following injury ([place]) 2007   Subarachnoid hemorrhage secondary to a fall December, 2007  . Takotsubo syndrome 04/22/2008   MI, May, 2008, question takotsubo event  . THYROID NODULE 01/14/2007    Past Surgical History:  Procedure Laterality Date  . ABDOMINAL HYSTERECTOMY    . BRAIN  TUMOR EXCISION     Meningioma; treated at [facility] in 2008  . CHOLECYSTECTOMY    . JOINT REPLACEMENT     rt THR  . SHOULDER SURGERY      Social History   Socioeconomic History  . Marital status: Widowed    Spouse name: Not on file  . Number of children: 4  . Years of education: Not on file  . Highest education level: Not on file  Social Needs  . Financial resource strain: Not on file  . Food insecurity - worry: Not on file  . Food insecurity - inability: Not on file  . Transportation needs - medical: Not on file  . Transportation needs - non-medical: Not on file  Occupational History    Employer: RETIRED  Tobacco Use  . Smoking status: Never Smoker  . Smokeless tobacco: Never Used  Substance and Sexual Activity  . Alcohol use: No    Alcohol/week: 0.0 oz  . Drug use: No  . Sexual activity: No    Birth control/protection: Surgical  Other Topics Concern  . Not on file  Social History Narrative   Widowed. Lives in [place], [place] with daughter.     Current Outpatient Medications on File Prior to Visit  Medication Sig Dispense Refill  . clopidogrel (PLAVIX) 75 MG tablet TAKE 1 TABLET DAILY WITH BREAKFAST 90 tablet 1  . cyanocobalamin (,VITAMIN B-12,) 1000 MCG/ML injection  INJECT 1 ML (1000 MCG TOTAL) INTRAMUSCULARLY EVERY 30 DAYS 3 mL 3  . isosorbide mononitrate (IMDUR) 30 MG 24 hr tablet TAKE ONE-HALF (1/2) TABLET DAILY 90 tablet 3  . methimazole (TAPAZOLE) 5 MG tablet TAKE 1 TABLET THREE TIMES A WEEK, MONDAY, WEDNESDAY, AND FRIDAY 45 tablet 1  . NEEDLE, DISP, 25 G (BD DISP NEEDLES) 25G X 5/8" MISC USE AS DIRECTED ONCE A MONTH FOR B12 INJECTIONS 3 each 4  . nitroGLYCERIN (NITROSTAT) 0.4 MG SL tablet Place 1 tablet (0.4 mg total) under the tongue every 5 (five) minutes as needed. For chest pain. 15 tablet 3  . pravastatin (PRAVACHOL) 20 MG tablet TAKE 1 TABLET DAILY IN THE EVENING 90 tablet 1  . PREMARIN vaginal cream USE 0.5 GRAM VAGINALLY TWICE A WEEK AS NEEDED 90 g 1  .  Syringe, Disposable, (B-D SYRINGE LUER-LOK 3CC) 3 ML MISC USE AS DIRECTED ONCE A MONTH FOR B12 INJECTIONS 3 each 5  . [DISCONTINUED] gabapentin (NEURONTIN) 100 MG capsule Take 1 capsule (100 mg total) by mouth at bedtime. (Patient not taking: Reported on 10/20/2014) 90 capsule 2   No current facility-administered medications on file prior to visit.     Allergies  Allergen Reactions  . Atorvastatin Other (See Comments)    Raises liver enzymes (takes pravastatin at home)  . Influenza Vaccines Other (See Comments)    Unknown  . Phenytoin Other (See Comments)    Blood clots  . Promethazine Hcl Other (See Comments)    Severe sedation  . Sulfamethoxazole     REACTION: unspecified  Per pt daughter she stated this causes rash    Family History  Problem Relation Age of Onset  . Heart attack Brother 84    BP 112/72   Pulse (!) 56   Ht 5\' 5"  (1.651 m)   Wt 110 lb (49.9 kg)   SpO2 95%   BMI 18.30 kg/m   Review of Systems Denies fever    Objective:   Physical Exam Vital signs: see vs page Gen: elderly, frail, no distress NECK: There is no palpable thyroid enlargement.  No thyroid nodule is palpable.  No palpable lymphadenopathy at the anterior neck.      Assessment & Plan:  Hyperthyroidism, due for recheck.  Patient Instructions  blood tests are requested for you today.  We'll let you know about the result.  If ever you have fever while taking methimazole, stop it and call us, even if the reason is obvious, because of the risk of a rare side-effect.   Please come back for a follow-up appointment in 6 months.

## 2017-04-21 NOTE — Patient Instructions (Signed)
blood tests are requested for you today.  We'll let you know about the result.  If ever you have fever while taking methimazole, stop it and call us, even if the reason is obvious, because of the risk of a rare side-effect.   Please come back for a follow-up appointment in 6 months.   

## 2017-04-29 DIAGNOSIS — H353132 Nonexudative age-related macular degeneration, bilateral, intermediate dry stage: Secondary | ICD-10-CM | POA: Diagnosis not present

## 2017-04-29 DIAGNOSIS — H04123 Dry eye syndrome of bilateral lacrimal glands: Secondary | ICD-10-CM | POA: Diagnosis not present

## 2017-05-05 ENCOUNTER — Ambulatory Visit (INDEPENDENT_AMBULATORY_CARE_PROVIDER_SITE_OTHER): Payer: Medicare Other | Admitting: Internal Medicine

## 2017-05-05 ENCOUNTER — Encounter: Payer: Self-pay | Admitting: Internal Medicine

## 2017-05-05 VITALS — BP 150/82 | HR 59 | Temp 98.0°F | Wt 109.4 lb

## 2017-05-05 DIAGNOSIS — M150 Primary generalized (osteo)arthritis: Secondary | ICD-10-CM

## 2017-05-05 DIAGNOSIS — E0590 Thyrotoxicosis, unspecified without thyrotoxic crisis or storm: Secondary | ICD-10-CM

## 2017-05-05 DIAGNOSIS — I10 Essential (primary) hypertension: Secondary | ICD-10-CM

## 2017-05-05 DIAGNOSIS — Z8673 Personal history of transient ischemic attack (TIA), and cerebral infarction without residual deficits: Secondary | ICD-10-CM | POA: Diagnosis not present

## 2017-05-05 DIAGNOSIS — M159 Polyosteoarthritis, unspecified: Secondary | ICD-10-CM

## 2017-05-05 DIAGNOSIS — M8949 Other hypertrophic osteoarthropathy, multiple sites: Secondary | ICD-10-CM

## 2017-05-05 MED ORDER — FLUTICASONE PROPIONATE 50 MCG/ACT NA SUSP: 2.0000 | Freq: Every day | NASAL | 6 refills | Status: DC

## 2017-05-05 NOTE — Progress Notes (Signed)
Subjective:    Patient ID: [name], female    DOB: [birth year], 84 y.o.   MRN: [id number]  HPI  84 year old patient who is seen today for her 58-month follow-up. She has a history of hyper thyroidism and is followed by endocrinology.  She remains on low-dose Tapazole.  Thyroid function studies recently have been normal She has a history of B12 deficiency and remains on monthly B12 injections. She has dyslipidemia and remains on statin therapy.  She also has a history of cerebrovascular disease.  She has essential hypertension and coronary artery disease.  In general she has done quite well at 89.  No cardiopulmonary complaints or focal neurological symptoms.  Past Medical History:  Diagnosis Date  . Abnormal CT of the chest    Stable patchy/nodular and ground-glass opacities in the right upper lobe, While grossly unchanged, low grade adenocarcinoma remains possible.  Marland Kitchen ANEMIA, PERNICIOUS 09/10/2006  . Bradycardia    August, 2013  . CORONARY ARTERY DISEASE 11/07/2008   a. NSTEMI 2001 & STEMI 2008 managed medically. b. Takotsubo 06/2006. c. Cath 2009 -> med rx.  . CVA WITH LEFT HEMIPARESIS 07/22/2007   May, 2009, neurology decided aspirin and Plavix at that time.  . DERMATITIS 03/02/2007  . DJD (degenerative joint disease)    L shoulder impingement  . Ejection fraction    EF 60%, echo, July, 2010, (  EF improved after tach a suitable event 2008)  . Family history of anesthesia complication    daughter has a hard time waking up  . H/O hiatal hernia   . HYPERTENSION 07/15/2006  . IBS (irritable bowel syndrome)   . MENINGIOMA 08/27/2006   Occipital meningioma, surgery, [facility], 2008 /  MRI, [facility], June, 2011, no change in lesion, history believe the patient had a gamma knife treatment of a right occipital atypical meningioma  . OSTEOARTHRITIS 07/15/2006  . PULMONARY NODULE 01/14/2007   [facility], December, 2011  . Stroke ([place])   . Subarachnoid hemorrhage following injury ([place]) 2007   Subarachnoid hemorrhage secondary to a fall December, 2007  . Takotsubo syndrome 04/22/2008   MI, May, 2008, question takotsubo event  . THYROID NODULE 01/14/2007     Social History   Socioeconomic History  . Marital status: Widowed    Spouse name: Not on file  . Number of children: 4  . Years of education: Not on file  . Highest education level: Not on file  Occupational History    Employer: RETIRED  Social Needs  . Financial resource strain: Not on file  . Food insecurity:    Worry: Not on file    Inability: Not on file  . Transportation needs:    Medical: Not on file    Non-medical: Not on file  Tobacco Use  . Smoking status: Never Smoker  . Smokeless tobacco: Never Used  Substance and Sexual Activity  . Alcohol use: No    Alcohol/week: 0.0 oz  . Drug use: No  . Sexual activity: Never    Birth control/protection: Surgical  Lifestyle  . Physical activity:    Days per week: Not on file    Minutes per session: Not on file  . Stress: Not on file  Relationships  . Social connections:    Talks on phone: Not on file    Gets together: Not on file    Attends religious service: Not on file    Active member of club or organization: Not on file    Attends meetings of clubs  or organizations: Not on file    Relationship status: Not on file  . Intimate partner violence:    Fear of current or ex partner: Not on file    Emotionally abused: Not on file    Physically abused: Not on file    Forced sexual activity: Not on file  Other Topics Concern  . Not on file  Social History Narrative   Widowed. Lives in [place], [place] with daughter.     Past Surgical History:  Procedure Laterality Date  . ABDOMINAL HYSTERECTOMY    . BRAIN TUMOR EXCISION     Meningioma; treated at [facility] in 2008  . CHOLECYSTECTOMY    . JOINT REPLACEMENT     rt THR  . SHOULDER SURGERY      Family History  Problem Relation Age of Onset  . Heart attack Brother 84    Allergies  Allergen  Reactions  . Atorvastatin Other (See Comments)    Raises liver enzymes (takes pravastatin at home)  . Influenza Vaccines Other (See Comments)    Unknown  . Phenytoin Other (See Comments)    Blood clots  . Promethazine Hcl Other (See Comments)    Severe sedation  . Sulfamethoxazole     REACTION: unspecified  Per pt daughter she stated this causes rash    Current Outpatient Medications on File Prior to Visit  Medication Sig Dispense Refill  . cycloSPORINE (RESTASIS) 0.05 % ophthalmic emulsion 1 drop 2 (two) times daily.    . isosorbide mononitrate (IMDUR) 30 MG 24 hr tablet TAKE ONE-HALF (1/2) TABLET DAILY 90 tablet 3  . methimazole (TAPAZOLE) 5 MG tablet TAKE 1 TABLET THREE TIMES A WEEK, MONDAY, WEDNESDAY, AND FRIDAY 45 tablet 1  . NEEDLE, DISP, 25 G (BD DISP NEEDLES) 25G X 5/8" MISC USE AS DIRECTED ONCE A MONTH FOR B12 INJECTIONS 3 each 4  . nitroGLYCERIN (NITROSTAT) 0.4 MG SL tablet Place 1 tablet (0.4 mg total) under the tongue every 5 (five) minutes as needed. For chest pain. 15 tablet 3  . pravastatin (PRAVACHOL) 20 MG tablet TAKE 1 TABLET DAILY IN THE EVENING 90 tablet 1  . PREMARIN vaginal cream USE 0.5 GRAM VAGINALLY TWICE A WEEK AS NEEDED 90 g 1  . Syringe, Disposable, (B-D SYRINGE LUER-LOK 3CC) 3 ML MISC USE AS DIRECTED ONCE A MONTH FOR B12 INJECTIONS 3 each 5  . [DISCONTINUED] gabapentin (NEURONTIN) 100 MG capsule Take 1 capsule (100 mg total) by mouth at bedtime. (Patient not taking: Reported on 10/20/2014) 90 capsule 2   No current facility-administered medications on file prior to visit.     BP (!) 150/82 (BP Location: Right Arm, Patient Position: Sitting, Cuff Size: Normal)   Pulse (!) 59   Temp 98 F (36.7 C) (Oral)   Wt 109 lb 6.4 oz (49.6 kg)   SpO2 100%   BMI 18.21 kg/m     Review of Systems  Constitutional: Negative.   HENT: Negative for congestion, dental problem, hearing loss, rhinorrhea, sinus pressure, sore throat and tinnitus.   Eyes: Negative for  pain, discharge and visual disturbance.  Respiratory: Negative for cough and shortness of breath.   Cardiovascular: Negative for chest pain, palpitations and leg swelling.  Gastrointestinal: Negative for abdominal distention, abdominal pain, blood in stool, constipation, diarrhea, nausea and vomiting.  Genitourinary: Negative for difficulty urinating, dysuria, flank pain, frequency, hematuria, pelvic pain, urgency, vaginal bleeding, vaginal discharge and vaginal pain.  Musculoskeletal: Positive for arthralgias, back pain and gait problem. Negative for joint swelling.  walks with a cane   Skin: Negative for rash.  Neurological: Negative for dizziness, syncope, speech difficulty, weakness, numbness and headaches.  Hematological: Negative for adenopathy.  Psychiatric/Behavioral: Negative for agitation, behavioral problems and dysphoric mood. The patient is not nervous/anxious.        Objective:   Physical Exam  Constitutional: She is oriented to person, place, and time. She appears well-developed and well-nourished.  Blood pressure 142/80 Able to stand easily from a sitting position and ambulate to the examining table  HENT:  Head: Normocephalic.  Right Ear: External ear normal.  Left Ear: External ear normal.  Mouth/Throat: Oropharynx is clear and moist.  Eyes: Pupils are equal, round, and reactive to light. Conjunctivae and EOM are normal.  Neck: Normal range of motion. Neck supple. No thyromegaly present.  Cardiovascular: Normal rate, regular rhythm, normal heart sounds and intact distal pulses.  Pulmonary/Chest: Effort normal and breath sounds normal.  Abdominal: Soft. Bowel sounds are normal. She exhibits no mass. There is no tenderness.  Musculoskeletal: Normal range of motion.  Lymphadenopathy:    She has no cervical adenopathy.  Neurological: She is alert and oriented to person, place, and time.  Skin: Skin is warm and dry. No rash noted.  Psychiatric: She has a normal  mood and affect. Her behavior is normal.          Assessment & Plan:   Vitamin B12 deficiency.  Continue monthly B12 injections indefinitely Essential hypertension stable Dyslipidemia.  Continue statin therapy Coronary artery disease stable Cerebrovascular disease stable History of hyperthyroidism.  Continue medical treatment and endocrinology follow-up  Follow-up 6 months or as needed  Nyoka Cowden

## 2017-05-05 NOTE — Patient Instructions (Signed)
Limit your sodium (Salt) intake  Return in 6 months for follow-up  endocrinology follow-up as scheduled

## 2017-05-19 DIAGNOSIS — I739 Peripheral vascular disease, unspecified: Secondary | ICD-10-CM | POA: Diagnosis not present

## 2017-05-19 DIAGNOSIS — L84 Corns and callosities: Secondary | ICD-10-CM | POA: Diagnosis not present

## 2017-05-19 DIAGNOSIS — L603 Nail dystrophy: Secondary | ICD-10-CM | POA: Diagnosis not present

## 2017-05-26 ENCOUNTER — Other Ambulatory Visit: Payer: Self-pay | Admitting: Internal Medicine

## 2017-05-27 NOTE — Telephone Encounter (Signed)
Okay for referral?

## 2017-05-27 NOTE — Telephone Encounter (Signed)
Okay for refill?  

## 2017-05-27 NOTE — Telephone Encounter (Signed)
Medication not on patient's list Okay to fill? Please advise

## 2017-06-12 ENCOUNTER — Observation Stay (HOSPITAL_COMMUNITY)
Admission: EM | Admit: 2017-06-12 | Discharge: 2017-06-14 | Disposition: A | Discharge status: Home / Self Care | Payer: Medicare Other | Attending: Internal Medicine | Admitting: Internal Medicine

## 2017-06-12 ENCOUNTER — Emergency Department (HOSPITAL_COMMUNITY): Payer: Medicare Other

## 2017-06-12 ENCOUNTER — Encounter (HOSPITAL_COMMUNITY): Payer: Self-pay | Admitting: Emergency Medicine

## 2017-06-12 DIAGNOSIS — D320 Benign neoplasm of cerebral meninges: Secondary | ICD-10-CM | POA: Diagnosis present

## 2017-06-12 DIAGNOSIS — I082 Rheumatic disorders of both aortic and tricuspid valves: Secondary | ICD-10-CM | POA: Insufficient documentation

## 2017-06-12 DIAGNOSIS — R000 Tachycardia, unspecified: Secondary | ICD-10-CM | POA: Insufficient documentation

## 2017-06-12 DIAGNOSIS — E860 Dehydration: Secondary | ICD-10-CM | POA: Diagnosis not present

## 2017-06-12 DIAGNOSIS — E0590 Thyrotoxicosis, unspecified without thyrotoxic crisis or storm: Secondary | ICD-10-CM | POA: Diagnosis not present

## 2017-06-12 DIAGNOSIS — G9341 Metabolic encephalopathy: Secondary | ICD-10-CM | POA: Diagnosis not present

## 2017-06-12 DIAGNOSIS — R531 Weakness: Secondary | ICD-10-CM | POA: Diagnosis not present

## 2017-06-12 DIAGNOSIS — I252 Old myocardial infarction: Secondary | ICD-10-CM | POA: Diagnosis not present

## 2017-06-12 DIAGNOSIS — M1990 Unspecified osteoarthritis, unspecified site: Secondary | ICD-10-CM | POA: Diagnosis present

## 2017-06-12 DIAGNOSIS — Z888 Allergy status to other drugs, medicaments and biological substances status: Secondary | ICD-10-CM | POA: Insufficient documentation

## 2017-06-12 DIAGNOSIS — R001 Bradycardia, unspecified: Secondary | ICD-10-CM | POA: Diagnosis not present

## 2017-06-12 DIAGNOSIS — Z86011 Personal history of benign neoplasm of the brain: Secondary | ICD-10-CM | POA: Insufficient documentation

## 2017-06-12 DIAGNOSIS — F0390 Unspecified dementia without behavioral disturbance: Secondary | ICD-10-CM | POA: Insufficient documentation

## 2017-06-12 DIAGNOSIS — Z887 Allergy status to serum and vaccine status: Secondary | ICD-10-CM | POA: Insufficient documentation

## 2017-06-12 DIAGNOSIS — K219 Gastro-esophageal reflux disease without esophagitis: Secondary | ICD-10-CM | POA: Diagnosis not present

## 2017-06-12 DIAGNOSIS — K589 Irritable bowel syndrome without diarrhea: Secondary | ICD-10-CM | POA: Insufficient documentation

## 2017-06-12 DIAGNOSIS — E785 Hyperlipidemia, unspecified: Secondary | ICD-10-CM | POA: Insufficient documentation

## 2017-06-12 DIAGNOSIS — Z882 Allergy status to sulfonamides status: Secondary | ICD-10-CM | POA: Insufficient documentation

## 2017-06-12 DIAGNOSIS — I10 Essential (primary) hypertension: Secondary | ICD-10-CM | POA: Diagnosis present

## 2017-06-12 DIAGNOSIS — Z66 Do not resuscitate: Secondary | ICD-10-CM | POA: Insufficient documentation

## 2017-06-12 DIAGNOSIS — W19XXXA Unspecified fall, initial encounter: Secondary | ICD-10-CM | POA: Diagnosis present

## 2017-06-12 DIAGNOSIS — N390 Urinary tract infection, site not specified: Secondary | ICD-10-CM | POA: Diagnosis not present

## 2017-06-12 DIAGNOSIS — R55 Syncope and collapse: Secondary | ICD-10-CM | POA: Diagnosis not present

## 2017-06-12 DIAGNOSIS — R911 Solitary pulmonary nodule: Secondary | ICD-10-CM | POA: Diagnosis not present

## 2017-06-12 DIAGNOSIS — G9389 Other specified disorders of brain: Secondary | ICD-10-CM | POA: Insufficient documentation

## 2017-06-12 DIAGNOSIS — M545 Low back pain: Secondary | ICD-10-CM | POA: Diagnosis not present

## 2017-06-12 DIAGNOSIS — E039 Hypothyroidism, unspecified: Secondary | ICD-10-CM | POA: Insufficient documentation

## 2017-06-12 DIAGNOSIS — I2510 Atherosclerotic heart disease of native coronary artery without angina pectoris: Secondary | ICD-10-CM | POA: Insufficient documentation

## 2017-06-12 DIAGNOSIS — S299XXA Unspecified injury of thorax, initial encounter: Secondary | ICD-10-CM | POA: Diagnosis not present

## 2017-06-12 DIAGNOSIS — Z79899 Other long term (current) drug therapy: Secondary | ICD-10-CM | POA: Insufficient documentation

## 2017-06-12 DIAGNOSIS — E43 Unspecified severe protein-calorie malnutrition: Secondary | ICD-10-CM

## 2017-06-12 DIAGNOSIS — R404 Transient alteration of awareness: Secondary | ICD-10-CM | POA: Diagnosis not present

## 2017-06-12 DIAGNOSIS — S0081XA Abrasion of other part of head, initial encounter: Secondary | ICD-10-CM | POA: Insufficient documentation

## 2017-06-12 DIAGNOSIS — Z96641 Presence of right artificial hip joint: Secondary | ICD-10-CM | POA: Insufficient documentation

## 2017-06-12 DIAGNOSIS — S0993XA Unspecified injury of face, initial encounter: Secondary | ICD-10-CM | POA: Diagnosis not present

## 2017-06-12 DIAGNOSIS — I69354 Hemiplegia and hemiparesis following cerebral infarction affecting left non-dominant side: Secondary | ICD-10-CM | POA: Insufficient documentation

## 2017-06-12 DIAGNOSIS — S0990XA Unspecified injury of head, initial encounter: Secondary | ICD-10-CM | POA: Diagnosis not present

## 2017-06-12 DIAGNOSIS — R4020 Unspecified coma: Secondary | ICD-10-CM | POA: Diagnosis not present

## 2017-06-12 DIAGNOSIS — S3993XA Unspecified injury of pelvis, initial encounter: Secondary | ICD-10-CM | POA: Diagnosis not present

## 2017-06-12 LAB — CBC WITH DIFFERENTIAL/PLATELET
Basophils Absolute: 0 10*3/uL (ref 0.0–0.1)
Basophils Relative: 0 %
EOS ABS: 0 10*3/uL (ref 0.0–0.7)
Eosinophils Relative: 0 %
HCT: 37.8 % (ref 36.0–46.0)
HEMOGLOBIN: 12.3 g/dL (ref 12.0–15.0)
LYMPHS ABS: 0.6 10*3/uL — AB (ref 0.7–4.0)
LYMPHS PCT: 5 %
MCH: 30 pg (ref 26.0–34.0)
MCHC: 32.5 g/dL (ref 30.0–36.0)
MCV: 92.2 fL (ref 78.0–100.0)
Monocytes Absolute: 1 10*3/uL (ref 0.1–1.0)
Monocytes Relative: 9 %
NEUTROS PCT: 86 %
Neutro Abs: 9.6 10*3/uL — ABNORMAL HIGH (ref 1.7–7.7)
Platelets: 190 10*3/uL (ref 150–400)
RBC: 4.1 MIL/uL (ref 3.87–5.11)
RDW: 14.7 % (ref 11.5–15.5)
WBC: 11.1 10*3/uL — AB (ref 4.0–10.5)

## 2017-06-12 LAB — COMPREHENSIVE METABOLIC PANEL
ALT: 15 U/L (ref 14–54)
ANION GAP: 9 (ref 5–15)
AST: 26 U/L (ref 15–41)
Albumin: 3.7 g/dL (ref 3.5–5.0)
Alkaline Phosphatase: 89 U/L (ref 38–126)
BUN: 15 mg/dL (ref 6–20)
CHLORIDE: 100 mmol/L — AB (ref 101–111)
CO2: 27 mmol/L (ref 22–32)
Calcium: 9 mg/dL (ref 8.9–10.3)
Creatinine, Ser: 0.95 mg/dL (ref 0.44–1.00)
GFR calc Af Amer: 58 mL/min — ABNORMAL LOW (ref >60)
GFR calc non Af Amer: 50 mL/min — ABNORMAL LOW (ref >60)
Glucose, Bld: 173 mg/dL — ABNORMAL HIGH (ref 65–99)
POTASSIUM: 4.1 mmol/L (ref 3.5–5.1)
SODIUM: 136 mmol/L (ref 135–145)
Total Bilirubin: 0.8 mg/dL (ref 0.3–1.2)
Total Protein: 6.3 g/dL — ABNORMAL LOW (ref 6.5–8.1)

## 2017-06-12 LAB — URINALYSIS, ROUTINE W REFLEX MICROSCOPIC
BILIRUBIN URINE: NEGATIVE
Glucose, UA: NEGATIVE mg/dL
HGB URINE DIPSTICK: NEGATIVE
Ketones, ur: 5 mg/dL — AB
NITRITE: NEGATIVE
PROTEIN: NEGATIVE mg/dL
Specific Gravity, Urine: 1.014 (ref 1.005–1.030)
pH: 5 (ref 5.0–8.0)

## 2017-06-12 LAB — I-STAT VENOUS BLOOD GAS, ED
ACID-BASE EXCESS: 3 mmol/L — AB (ref 0.0–2.0)
Bicarbonate: 27.6 mmol/L (ref 20.0–28.0)
O2 Saturation: 52 %
PH VEN: 7.421 (ref 7.250–7.430)
TCO2: 29 mmol/L (ref 22–32)
pCO2, Ven: 42.4 mmHg — ABNORMAL LOW (ref 44.0–60.0)
pO2, Ven: 27 mmHg — CL (ref 32.0–45.0)

## 2017-06-12 LAB — TROPONIN I

## 2017-06-12 LAB — I-STAT CG4 LACTIC ACID, ED: Lactic Acid, Venous: 1.1 mmol/L (ref 0.5–1.9)

## 2017-06-12 LAB — LIPASE, BLOOD: Lipase: 35 U/L (ref 11–51)

## 2017-06-12 MED ORDER — SODIUM CHLORIDE 0.9 % IV BOLUS
1000.0000 mL | Freq: Once | INTRAVENOUS | Status: AC
  Administered 2017-06-12: 1000 mL via INTRAVENOUS

## 2017-06-12 MED ORDER — POLYETHYLENE GLYCOL 3350 17 G PO PACK: 17.0000 g | PACK | Freq: Every day | ORAL | Status: DC | PRN

## 2017-06-12 MED ORDER — FENTANYL CITRATE (PF) 100 MCG/2ML IJ SOLN
12.5000 ug | Freq: Once | INTRAMUSCULAR | Status: AC
  Administered 2017-06-12: 12.5 ug via INTRAVENOUS
  Filled 2017-06-12: qty 2

## 2017-06-12 MED ORDER — ISOSORBIDE MONONITRATE ER 30 MG PO TB24
15.0000 mg | ORAL_TABLET | Freq: Every day | ORAL | Status: DC
  Administered 2017-06-13: 15 mg via ORAL
  Filled 2017-06-12: qty 1

## 2017-06-12 MED ORDER — ONDANSETRON HCL 4 MG PO TABS: 4.0000 mg | ORAL_TABLET | Freq: Four times a day (QID) | ORAL | Status: DC | PRN

## 2017-06-12 MED ORDER — FLUTICASONE PROPIONATE 50 MCG/ACT NA SUSP
2.0000 | Freq: Every day | NASAL | Status: DC
  Administered 2017-06-13: 2 via NASAL
  Filled 2017-06-12: qty 16

## 2017-06-12 MED ORDER — METHIMAZOLE 5 MG PO TABS
5.0000 mg | ORAL_TABLET | ORAL | Status: DC
  Administered 2017-06-13: 5 mg via ORAL
  Filled 2017-06-12: qty 1

## 2017-06-12 MED ORDER — ENOXAPARIN SODIUM 30 MG/0.3ML ~~LOC~~ SOLN
30.0000 mg | Freq: Every day | SUBCUTANEOUS | Status: DC
  Administered 2017-06-13 – 2017-06-14 (×2): 30 mg via SUBCUTANEOUS
  Filled 2017-06-12 (×2): qty 0.3

## 2017-06-12 MED ORDER — ONDANSETRON HCL 4 MG/2ML IJ SOLN: 4.0000 mg | Freq: Four times a day (QID) | INTRAMUSCULAR | Status: DC | PRN

## 2017-06-12 MED ORDER — PRAVASTATIN SODIUM 20 MG PO TABS
20.0000 mg | ORAL_TABLET | Freq: Every evening | ORAL | Status: DC
  Administered 2017-06-13: 20 mg via ORAL
  Filled 2017-06-12: qty 1

## 2017-06-12 NOTE — H&P (Signed)
History and Physical    [name] DOB: [birth year] DOA: 06/12/2017  PCP: Marletta Lor, MD   Patient coming from: Home   Chief Complaint: Fall  HPI: [name] is a 84 y.o. female with medical history significant for HTN, Osteoathritis, cardiac hypothyroidism, meningioma, was brought to the ED via EMS with reports of a fall.  She lives alone.  Ambulates with walker at baseline but with a cane in the kitchen.  Patient fell, she cannot remember the details, she pressed her medic alert button.  Patient was found on the floor, the kitchen, with her cane.  Patient's daughter who is present at bedside helps with most of the history, patient answers intermittently, daughter reports earlier in the day patient was complaining of lower left chest/rib pain, but no SOB.  Also patient's daughter notes progressive external decline/weakness over the past few weeks. Until 2 days ago patient had maintained good p.o. Intake.  Daughter checks on patient every day, bathes patient, and helps with some activities of daily living.  At baseline she has mild/minimal memory deficits. No vomiting, no known loose stools.   ED Course: Pulse rates 60s to 70s, intermittent low readings 33-40 likely spurious.  Blood pressure systolic 591M- 384Y.  WBC- 11.1, hemoglobin baseline 12.3, creatinine at baseline 0.95. UA-rare bacteria, trace leuks. LActic acid- 1.1. Head and  maxillofacial CT-acute abnormality,  S/p right occipital/parietal craniectomy for meningioma, residual/recurrent meningioma mildly increased in size from prior exam.  Lumbar spine x-ray negative for acute abnormality , two-view chest x-ray -PT for acute abnormality , 75mm right upper lobe pulmonary nodule CT chest without contrast recommended .  Pelvic x-ray negative for acute abnormality . 1 L bolus given in ED. Hospitalist was called to admit fall/syncope.  Review of Systems: Not able to fully evaluate due to lethargy.  Past Medical  History:  Diagnosis Date  . Abnormal CT of the chest    Stable patchy/nodular and ground-glass opacities in the right upper lobe, While grossly unchanged, low grade adenocarcinoma remains possible.  [facility] Kitchen ANEMIA, PERNICIOUS 09/10/2006  . Bradycardia    August, 2013  . CORONARY ARTERY DISEASE 11/07/2008   a. NSTEMI 2001 & STEMI 2008 managed medically. b. Takotsubo 06/2006. c. Cath 2009 -> med rx.  . CVA WITH LEFT HEMIPARESIS 07/22/2007   May, 2009, neurology decided aspirin and Plavix at that time.  . DERMATITIS 03/02/2007  . DJD (degenerative joint disease)    L shoulder impingement  . Ejection fraction    EF 60%, echo, July, 2010, (  EF improved after tach a suitable event 2008)  . Family history of anesthesia complication    daughter has a hard time waking up  . H/O hiatal hernia   . HYPERTENSION 07/15/2006  . IBS (irritable bowel syndrome)   . MENINGIOMA 08/27/2006   Occipital meningioma, surgery, [facility], 2008 /  MRI, [facility], June, 2011, no change in lesion, history believe the patient had a gamma knife treatment of a right occipital atypical meningioma  . OSTEOARTHRITIS 07/15/2006  . PULMONARY NODULE 01/14/2007   [place], December, 2011  . Stroke ([place])   . Subarachnoid hemorrhage following injury ([place]) 2007   Subarachnoid hemorrhage secondary to a fall December, 2007  . Takotsubo syndrome 04/22/2008   MI, May, 2008, question takotsubo event  . THYROID NODULE 01/14/2007    Past Surgical History:  Procedure Laterality Date  . ABDOMINAL HYSTERECTOMY    . BRAIN TUMOR EXCISION     Meningioma; [facility] at [facility] in 2008  .  CHOLECYSTECTOMY    . JOINT REPLACEMENT     rt THR  . SHOULDER SURGERY       reports that she has never smoked. She has never used smokeless tobacco. She reports that she does not drink alcohol or use drugs.  Allergies  Allergen Reactions  . Atorvastatin Other (See Comments)    Raises liver enzymes (takes pravastatin at home)  . Influenza Vaccines Other (See  Comments)    Unknown  . Phenytoin Other (See Comments)    Blood clots  . Promethazine Hcl Other (See Comments)    Severe sedation  . Sulfamethoxazole     REACTION: unspecified  Per pt daughter she stated this causes rash    Family History  Problem Relation Age of Onset  . Heart attack Brother 84    Prior to Admission medications   Medication Sig Start Date End Date Taking? Authorizing Provider  acetaminophen (TYLENOL) 325 MG tablet Take 325 mg by mouth every 6 (six) hours as needed for mild pain or moderate pain.   Yes [provider]  cyanocobalamin (,VITAMIN B-12,) 1000 MCG/ML injection INJECT 1 ML (1000 MCG TOTAL) INTRAMUSCULARLY EVERY 30 DAYS 05/27/17  Yes Marletta Lor, MD  cycloSPORINE (RESTASIS) 0.05 % ophthalmic emulsion 1 drop 2 (two) times daily.   Yes [provider]  fluticasone (FLONASE) 50 MCG/ACT nasal spray Place 2 sprays into both nostrils daily. 05/05/17  Yes Marletta Lor, MD  isosorbide mononitrate (IMDUR) 30 MG 24 hr tablet TAKE ONE-HALF (1/2) TABLET DAILY Patient taking differently: TAKE 0.5 TABLETS (15 MG TOTALLY)  DAILY 01/07/17  Yes Marletta Lor, MD  methimazole (TAPAZOLE) 5 MG tablet TAKE 1 TABLET THREE TIMES A WEEK, MONDAY, WEDNESDAY, AND FRIDAY Patient taking differently: TAKE 1 TABLET (5 MG TOTALLY) THREE TIMES A WEEK, MON, WED, AND FRI 04/07/17  Yes Renato Shin, MD  nitroGLYCERIN (NITROSTAT) 0.4 MG SL tablet Place 1 tablet (0.4 mg total) under the tongue every 5 (five) minutes as needed. For chest pain. 05/04/13  Yes Marletta Lor, MD  OVER THE COUNTER MEDICATION Take 1 Package by mouth See admin instructions. Take 1 pack of [employer] advocare by mouth everyday for energy   Yes [provider]  pravastatin (PRAVACHOL) 20 MG tablet TAKE 1 TABLET DAILY IN THE EVENING Patient taking differently: TAKE 1 TABLET (20 MG TOTALLY) DAILY IN THE EVENING 01/07/17  Yes Marletta Lor, MD  NEEDLE, DISP, 25 G (BD DISP  NEEDLES) 25G X 5/8" MISC USE AS DIRECTED ONCE A MONTH FOR B12 INJECTIONS 05/13/16   Marletta Lor, MD  PREMARIN vaginal cream USE 0.5 GRAM VAGINALLY TWICE A WEEK AS NEEDED Patient not taking: Reported on 06/12/2017 01/05/15   Marletta Lor, MD  Syringe, Disposable, (B-D SYRINGE LUER-LOK 3CC) 3 ML MISC USE AS DIRECTED ONCE A MONTH FOR B12 INJECTIONS 11/28/16   Marletta Lor, MD  gabapentin (NEURONTIN) 100 MG capsule Take 1 capsule (100 mg total) by mouth at bedtime. Patient not taking: Reported on 10/20/2014 09/28/14 10/21/14  Marletta Lor, MD    Physical Exam: Vitals:   06/12/17 2215 06/12/17 2230 06/12/17 2245 06/12/17 2300  BP: (!) 170/98 (!) 169/68 (!) 181/91 (!) 165/68  Pulse: 63 (!) 40 (!) 37 62  Resp: (!) 22 (!) 25 (!) 21 20  Temp:      TempSrc:      SpO2: 94% 94% 97% 93%  Weight:      Height:  Constitutional: Appears weak, lethargic Vitals:   06/12/17 2215 06/12/17 2230 06/12/17 2245 06/12/17 2300  BP: (!) 170/98 (!) 169/68 (!) 181/91 (!) 165/68  Pulse: 63 (!) 40 (!) 37 62  Resp: (!) 22 (!) 25 (!) 21 20  Temp:      TempSrc:      SpO2: 94% 94% 97% 93%  Weight:      Height:       Eyes: PERRL, lids and conjunctivae normal ENMT: Mucous membranes are dry. .  Neck: normal, supple, no masses, no thyromegaly Respiratory: clear to auscultation bilaterally, no wheezing, no crackles. Normal respiratory effort. No accessory muscle use.  Cardiovascular: Regular rate and rhythm, no murmurs / rubs / gallops. No extremity edema. 2+ pedal pulses.   Abdomen: no tenderness, no masses palpated. No hepatosplenomegaly. Bowel sounds positive.  Musculoskeletal: no clubbing / cyanosis. No joint deformity upper and lower extremities. Good ROM, no contractures. Normal muscle tone.  Skin: no rashes, lesions, ulcers. No induration Neurologic: CN 2-12 grossly intact. Strength 5/5 in all 4.  Psychiatric: Lethargic, answers some questions..   Labs on Admission: I have  personally reviewed following labs and imaging studies  CBC: Recent Labs  Lab 06/12/17 1954  WBC 11.1*  NEUTROABS 9.6*  HGB 12.3  HCT 37.8  MCV 92.2  PLT 253   Basic Metabolic Panel: Recent Labs  Lab 06/12/17 1954  NA 136  K 4.1  CL 100*  CO2 27  GLUCOSE 173*  BUN 15  CREATININE 0.95  CALCIUM 9.0   Liver Function Tests: Recent Labs  Lab 06/12/17 1954  AST 26  ALT 15  ALKPHOS 89  BILITOT 0.8  PROT 6.3*  ALBUMIN 3.7   Recent Labs  Lab 06/12/17 1954  LIPASE 35   Urine analysis:    Component Value Date/Time   COLORURINE YELLOW 06/12/2017 2155   APPEARANCEUR HAZY (A) 06/12/2017 2155   LABSPEC 1.014 06/12/2017 2155   PHURINE 5.0 06/12/2017 2155   GLUCOSEU NEGATIVE 06/12/2017 2155   HGBUR NEGATIVE 06/12/2017 2155   HGBUR negative 09/20/2009 0959   BILIRUBINUR NEGATIVE 06/12/2017 2155   BILIRUBINUR n 09/17/2012 1116   KETONESUR 5 (A) 06/12/2017 2155   PROTEINUR NEGATIVE 06/12/2017 2155   UROBILINOGEN 0.2 10/21/2014 0009   NITRITE NEGATIVE 06/12/2017 2155   LEUKOCYTESUR TRACE (A) 06/12/2017 2155    Radiological Exams on Admission: Dg Chest 2 View  Result Date: 06/12/2017 CLINICAL DATA:  Fall. EXAM: CHEST - 2 VIEW COMPARISON:  08/13/2013. FINDINGS: Lungs are hyperexpanded. The lungs are clear without focal pneumonia, edema, pneumothorax or pleural effusion. Interstitial markings are diffusely coarsened with chronic features. 12 mm nodule identified right upper lung, not seen on prior imaging. Bones are diffusely demineralized. Telemetry leads overlie the chest. IMPRESSION: 12 mm right upper lobe pulmonary nodule. CT chest without contrast recommended to further evaluate. Emphysema. Electronically Signed   By: Misty Stanley M.D.   On: 06/12/2017 20:33   Dg Lumbar Spine Complete  Result Date: 06/12/2017 CLINICAL DATA:  Fall with back pain EXAM: LUMBAR SPINE - COMPLETE 4+ VIEW COMPARISON:  None. FINDINGS: The bones are osteopenic. There is mild right convex  asymmetry of the lumbar spine. There is no acute fracture or listhesis. Vertebral body heights are maintained. Mild degenerative disc disease, greatest at L4-L5. There is extensive atherosclerotic calcification of the aorta. Normal appearance of the sacrum. IMPRESSION: No acute fracture or static subluxation of the lumbar spine. Electronically Signed   By: Ulyses Jarred M.D.   On: 06/12/2017  21:39   Dg Pelvis 1-2 Views  Result Date: 06/12/2017 CLINICAL DATA:  Fall. EXAM: PELVIS - 1-2 VIEW COMPARISON:  09/27/2016 FINDINGS: Bones are diffusely demineralized. SI joints and symphysis pubis unremarkable. Patient is status post right total hip replacement with femoral component incompletely visualized. Degenerative changes are noted in left hip, as before. IMPRESSION: Negative. Electronically Signed   By: Misty Stanley M.D.   On: 06/12/2017 20:31   Ct Head Wo Contrast  Result Date: 06/12/2017 CLINICAL DATA:  Altered level of consciousness following blunt trauma to the face and head. EXAM: CT HEAD WITHOUT CONTRAST CT MAXILLOFACIAL WITHOUT CONTRAST TECHNIQUE: Multidetector CT imaging of the head and maxillofacial structures were performed using the standard protocol without intravenous contrast. Multiplanar CT image reconstructions of the maxillofacial structures were also generated. COMPARISON:  08/02/2016 FINDINGS: CT HEAD FINDINGS Brain: There is focal encephalomalacia involving the right posterior parietal and occipital lobes. This lies underneath a prior craniotomy flap. There is an area bone resorption of the posterior calvarium involving a portion of the craniotomy flap. Within this area of resorption is a soft tissue mass that bulges into the subgaleal soft tissues and into the extra-axial space adjacent to and involving the posterior, superior sagittal sinus. This mass measures 2.8 x 2.7 cm transversely. On the prior exam, the mass is slightly smaller, measuring 2.6 x 2.5 cm. The amount of bone resorption as  slightly increased. There are no parenchymal masses or mass effect. There is no evidence of an acute infarct. There is no intracranial hemorrhage. No other extra-axial abnormality. Ventricles are normal in size and configuration. Patchy white matter hypoattenuation is noted consistent with moderate chronic microvascular ischemic change. Vascular: No hyperdense vessel or unexpected calcification. Skull: No skull fracture.  No other skull lesion. Other: None. CT MAXILLOFACIAL FINDINGS Osseous: No fracture.  No bone lesion. Orbits: Negative. No traumatic or inflammatory finding. Sinuses: Clear sinuses, mastoid air cells and middle ear cavities. Soft tissues: No convincing soft tissue contusion or hematoma. No mass or adenopathy. IMPRESSION: HEAD CT 1. No acute intracranial abnormalities. 2. Status post posterior right occipital/parietal craniotomy reportedly for a meningioma resection. There is bone resorption along the craniotomy flap within which lies an extra-axial mass spanning the defect in the craniotomy flap. This is consistent with residual/recurrent meningioma, which has mildly increased in size from the prior exam. MAXILLOFACIAL CT 1. No fracture or acute finding. Electronically Signed   By: Lajean Manes M.D.   On: 06/12/2017 20:47   Ct Maxillofacial Wo Cm  Result Date: 06/12/2017 CLINICAL DATA:  Altered level of consciousness following blunt trauma to the face and head. EXAM: CT HEAD WITHOUT CONTRAST CT MAXILLOFACIAL WITHOUT CONTRAST TECHNIQUE: Multidetector CT imaging of the head and maxillofacial structures were performed using the standard protocol without intravenous contrast. Multiplanar CT image reconstructions of the maxillofacial structures were also generated. COMPARISON:  08/02/2016 FINDINGS: CT HEAD FINDINGS Brain: There is focal encephalomalacia involving the right posterior parietal and occipital lobes. This lies underneath a prior craniotomy flap. There is an area bone resorption of the  posterior calvarium involving a portion of the craniotomy flap. Within this area of resorption is a soft tissue mass that bulges into the subgaleal soft tissues and into the extra-axial space adjacent to and involving the posterior, superior sagittal sinus. This mass measures 2.8 x 2.7 cm transversely. On the prior exam, the mass is slightly smaller, measuring 2.6 x 2.5 cm. The amount of bone resorption as slightly increased. There are no parenchymal masses or  mass effect. There is no evidence of an acute infarct. There is no intracranial hemorrhage. No other extra-axial abnormality. Ventricles are normal in size and configuration. Patchy white matter hypoattenuation is noted consistent with moderate chronic microvascular ischemic change. Vascular: No hyperdense vessel or unexpected calcification. Skull: No skull fracture.  No other skull lesion. Other: None. CT MAXILLOFACIAL FINDINGS Osseous: No fracture.  No bone lesion. Orbits: Negative. No traumatic or inflammatory finding. Sinuses: Clear sinuses, mastoid air cells and middle ear cavities. Soft tissues: No convincing soft tissue contusion or hematoma. No mass or adenopathy. IMPRESSION: HEAD CT 1. No acute intracranial abnormalities. 2. Status post posterior right occipital/parietal craniotomy reportedly for a meningioma resection. There is bone resorption along the craniotomy flap within which lies an extra-axial mass spanning the defect in the craniotomy flap. This is consistent with residual/recurrent meningioma, which has mildly increased in size from the prior exam. MAXILLOFACIAL CT 1. No fracture or acute finding. Electronically Signed   By: Lajean Manes M.D.   On: 06/12/2017 20:47    EKG: Independently reviewed. Rate 83, PVCs, prolonged Qtc.  Assessment/Plan Active Problems:   Essential hypertension   Osteoarthritis   MENINGIOMA   Bradycardia   Hyperthyroidism   Fall   Fall/syncope. Unwitnessed. Likely multifactorial- recent poor PO intake,  increasing Fraility 2/2 advanced age, possible UTI. Reports earlier of chest pain. Stable vitals. Head CT - negative for acute abnormlaities, shows recurrence and mildly increased size of prior meningioma. ? Contribution to fall. Pelvic xray and chest xray negative. 1L bolus given in ED - Echo - trend trops - Magnesium. - TSH recently checked- 2.3- 04/2017. - PT eval - Will hold off on carotid dopplers as Carotid occlusions very rarely cause syncope. - Orthostatic vitals  Metabolic encephalopathy- lethargy. UA- few leuks and bact, not very convincing for UTI but will start treatment. [place] vitals. Chest xray- clear. Lactic acid- 1.1. WBC- 11.1. Afebrile. Abdomen is benign.  - Will get urine cultures - IV ceftriazone, pending urine cultures  Pulmonary Nodule- on Xray - Ct wo contrast recommended.  Hyperthyrodism- TSH recently checked- 2.3- 04/2017. - Cont methimazole M W F.  DVT prophylaxis: \Lovenox Code Status: DNR confirmed with daughter at bedside who is HCPOA- Mallie Snooks. Family Communication: HCPOA- Mallie Snooks, at bedside Disposition Plan: Per rounding team Consults called: None Admission status: Obs, tele   Bethena Roys MD [facility] Pager [phone] From 6PM-2AM.  Otherwise please contact night-coverage [email or web address] Password [email or web address]  06/12/2017, 11:21 PM

## 2017-06-12 NOTE — ED Triage Notes (Signed)
Per EMS, pt from home. Pt reports having a mechanical fall with no obvious injury. Pt on Plavix. There is a bump on the back of her head which is normal per family. Pt lethargic and nauseous x2 days. Pt c/o pain under left breast that radiated to her jaw/"teeth", pt took a nitro that took her pain away. Pt denies pain at this time. Pt alert and oriented x3 baseline. 4mg  zofran given by ems. Hx of MI, stroke, HTN.   EMS VSS BP 172/82, HR 64, R 16, 91% room air, 98% on 2LNC. CBG 193.

## 2017-06-12 NOTE — ED Notes (Signed)
Patient transported to X-ray 

## 2017-06-12 NOTE — ED Provider Notes (Signed)
[facility] Provider Note   CSN: [id number] Arrival date & time: 06/12/17  4854     History   Chief Complaint Chief Complaint  Patient presents with  . Weakness  . Fall    HPI [name] is a 84 y.o. female.  HPI This elderly female presents with her children who assist with the HPI. Patient is chronically weak, though notably weak over the past week or so, and today had substantial increase from this recent decline in functionality. Patient had minimal oral intake throughout the day, and late in the day had an episode of unclear circumstances, syncope versus fall. Family found the patient on the ground, with abrasion to her chin, left brow, after the patient pushed her medic alert button. No apparent new deformity, nor focal weakness, but with concern for the episode, weakness and possible syncope, she was brought here for evaluation. The patient herself denies focal pain, acknowledges weakness. Family denies recent changes in medicine, activity. They state that she is interacting a much more withdrawn, noninteractive fashion than usual.  Past Medical History:  Diagnosis Date  . Abnormal CT of the chest    Stable patchy/nodular and ground-glass opacities in the right upper lobe, While grossly unchanged, low grade adenocarcinoma remains possible.  Marland Kitchen ANEMIA, PERNICIOUS 09/10/2006  . Bradycardia    August, 2013  . CORONARY ARTERY DISEASE 11/07/2008   a. NSTEMI 2001 & STEMI 2008 managed medically. b. Takotsubo 06/2006. c. Cath 2009 -> med rx.  . CVA WITH LEFT HEMIPARESIS 07/22/2007   May, 2009, neurology decided aspirin and Plavix at that time.  . DERMATITIS 03/02/2007  . DJD (degenerative joint disease)    L shoulder impingement  . Ejection fraction    EF 60%, echo, July, 2010, (  EF improved after tach a suitable event 2008)  . Family history of anesthesia complication    daughter has a hard time waking up  . H/O hiatal hernia   .  HYPERTENSION 07/15/2006  . IBS (irritable bowel syndrome)   . MENINGIOMA 08/27/2006   Occipital meningioma, surgery, [facility], 2008 /  MRI, [place], June, 2011, no change in lesion, history believe the patient had a gamma knife treatment of a right occipital atypical meningioma  . OSTEOARTHRITIS 07/15/2006  . PULMONARY NODULE 01/14/2007   [place], December, 2011  . Stroke ([place])   . Subarachnoid hemorrhage following injury ([place]) 2007   Subarachnoid hemorrhage secondary to a fall December, 2007  . Takotsubo syndrome 04/22/2008   MI, May, 2008, question takotsubo event  . THYROID NODULE 01/14/2007    Patient Active Problem List   Diagnosis Date Noted  . Headache 09/25/2016  . History of stroke 11/15/2014  . History of meningioma 11/15/2014  . GERD (gastroesophageal reflux disease) 11/19/2011  . Dysphagia 11/18/2011  . Palpitations 11/17/2011  . Encounter for long-term (current) use of other medications 10/29/2011  . Hyperthyroidism 10/14/2011  . Vertigo 10/14/2011  . Bradycardia   . Subarachnoid hemorrhage following injury ([place])   . Hyperlipidemia 03/05/2011  . Takotsubo syndrome 04/22/2008  . Hemiplegia, late effect of cerebrovascular disease ([place]) 07/22/2007  . THYROID NODULE 01/14/2007  . PULMONARY NODULE 01/14/2007  . MENINGIOMA 08/27/2006  . Essential hypertension 07/15/2006  . Osteoarthritis 07/15/2006    Past Surgical History:  Procedure Laterality Date  . ABDOMINAL HYSTERECTOMY    . BRAIN TUMOR EXCISION     Meningioma; [facility] at [facility] in 2008  . CHOLECYSTECTOMY    . JOINT REPLACEMENT  rt THR  . SHOULDER SURGERY       OB History   None      Home Medications    Prior to Admission medications   Medication Sig Start Date End Date Taking? Authorizing Provider  acetaminophen (TYLENOL) 325 MG tablet Take 325 mg by mouth every 6 (six) hours as needed for mild pain or moderate pain.   Yes [provider]  cyanocobalamin (,VITAMIN B-12,) 1000 MCG/ML  injection INJECT 1 ML (1000 MCG TOTAL) INTRAMUSCULARLY EVERY 30 DAYS 05/27/17  Yes Marletta Lor, MD  cycloSPORINE (RESTASIS) 0.05 % ophthalmic emulsion 1 drop 2 (two) times daily.   Yes [provider]  fluticasone (FLONASE) 50 MCG/ACT nasal spray Place 2 sprays into both nostrils daily. 05/05/17  Yes Marletta Lor, MD  isosorbide mononitrate (IMDUR) 30 MG 24 hr tablet TAKE ONE-HALF (1/2) TABLET DAILY Patient taking differently: TAKE 0.5 TABLETS (15 MG TOTALLY)  DAILY 01/07/17  Yes Marletta Lor, MD  methimazole (TAPAZOLE) 5 MG tablet TAKE 1 TABLET THREE TIMES A WEEK, MONDAY, WEDNESDAY, AND FRIDAY Patient taking differently: TAKE 1 TABLET (5 MG TOTALLY) THREE TIMES A WEEK, MON, WED, AND FRI 04/07/17  Yes Renato Shin, MD  nitroGLYCERIN (NITROSTAT) 0.4 MG SL tablet Place 1 tablet (0.4 mg total) under the tongue every 5 (five) minutes as needed. For chest pain. 05/04/13  Yes Marletta Lor, MD  OVER THE COUNTER MEDICATION Take 1 Package by mouth See admin instructions. Take 1 pack of [employer] advocare by mouth everyday for energy   Yes [provider]  pravastatin (PRAVACHOL) 20 MG tablet TAKE 1 TABLET DAILY IN THE EVENING Patient taking differently: TAKE 1 TABLET (20 MG TOTALLY) DAILY IN THE EVENING 01/07/17  Yes Marletta Lor, MD  NEEDLE, DISP, 25 G (BD DISP NEEDLES) 25G X 5/8" MISC USE AS DIRECTED ONCE A MONTH FOR B12 INJECTIONS 05/13/16   Marletta Lor, MD  PREMARIN vaginal cream USE 0.5 GRAM VAGINALLY TWICE A WEEK AS NEEDED Patient not taking: Reported on 06/12/2017 01/05/15   Marletta Lor, MD  Syringe, Disposable, (B-D SYRINGE LUER-LOK 3CC) 3 ML MISC USE AS DIRECTED ONCE A MONTH FOR B12 INJECTIONS 11/28/16   Marletta Lor, MD  gabapentin (NEURONTIN) 100 MG capsule Take 1 capsule (100 mg total) by mouth at bedtime. Patient not taking: Reported on 10/20/2014 09/28/14 10/21/14  Marletta Lor, MD    Family History Family History    Problem Relation Age of Onset  . Heart attack Brother 45    Social History Social History   Tobacco Use  . Smoking status: Never Smoker  . Smokeless tobacco: Never Used  Substance Use Topics  . Alcohol use: No    Alcohol/week: 0.0 oz  . Drug use: No     Allergies   Atorvastatin; Influenza vaccines; Phenytoin; Promethazine hcl; and Sulfamethoxazole   Review of Systems Review of Systems  Constitutional:       Per HPI, otherwise negative  HENT:       Per HPI, otherwise negative  Respiratory:       Per HPI, otherwise negative  Cardiovascular:       Per HPI, otherwise negative  Gastrointestinal: Negative for vomiting.  Endocrine:       Negative aside from HPI  Genitourinary:       Neg aside from HPI   Musculoskeletal:       Per HPI, otherwise negative  Skin: Positive for color change.  Neurological: Positive for weakness.  Negative for syncope.     Physical Exam Updated Vital Signs BP (!) 158/87   Pulse 61   Temp 97.7 F (36.5 C) (Oral)   Resp (!) 30   Ht 5\' 4"  (1.626 m)   Wt 49.4 kg (109 lb)   SpO2 94%   BMI 18.71 kg/m   Physical Exam  Constitutional: She is oriented to person, place, and time. She has a sickly appearance. No distress.  HENT:  Head: Normocephalic.    Eyes: Conjunctivae and EOM are normal.  Cardiovascular: Normal rate and regular rhythm.  Pulmonary/Chest: Effort normal and breath sounds normal. No stridor. No respiratory distress.  Abdominal: She exhibits no distension.  Musculoskeletal: She exhibits no edema.  Neurological: She is alert and oriented to person, place, and time. No cranial nerve deficit.  Patient is oriented x3, has very poor hearing, substantial atrophy, but otherwise unremarkable neurologic exam  Skin: Skin is warm and dry.  Psychiatric: She is slowed and withdrawn.  Nursing note and vitals reviewed.    ED Treatments / Results  Labs (all labs ordered are listed, but only abnormal results are displayed) Labs  Reviewed  COMPREHENSIVE METABOLIC PANEL - Abnormal; Notable for the following components:      Result Value   Chloride 100 (*)    Glucose, Bld 173 (*)    Total Protein 6.3 (*)    GFR calc non Af Amer 50 (*)    GFR calc Af Amer 58 (*)    All other components within normal limits  CBC WITH DIFFERENTIAL/PLATELET - Abnormal; Notable for the following components:   WBC 11.1 (*)    Neutro Abs 9.6 (*)    Lymphs Abs 0.6 (*)    All other components within normal limits  URINALYSIS, ROUTINE W REFLEX MICROSCOPIC - Abnormal; Notable for the following components:   APPearance HAZY (*)    Ketones, ur 5 (*)    Leukocytes, UA TRACE (*)    Bacteria, UA RARE (*)    All other components within normal limits  I-STAT VENOUS BLOOD GAS, ED - Abnormal; Notable for the following components:   pCO2, Ven 42.4 (*)    pO2, Ven 27.0 (*)    Acid-Base Excess 3.0 (*)    All other components within normal limits  LIPASE, BLOOD  I-STAT CG4 LACTIC ACID, ED    EKG EKG Interpretation  Date/Time:  Thursday Jun 12 2017 19:03:30 EDT Ventricular Rate:  75 PR Interval:    QRS Duration: 106 QT Interval:  488 QTC Calculation: 546 R Axis:   -11 Text Interpretation:  Unknown rhythm, irregular rate Prolonged PR interval T wave abnormality Artifact Abnormal ekg Confirmed by Carmin Muskrat [phone]) on 06/12/2017 7:09:11 PM   Radiology Dg Chest 2 View  Result Date: 06/12/2017 CLINICAL DATA:  Fall. EXAM: CHEST - 2 VIEW COMPARISON:  08/13/2013. FINDINGS: Lungs are hyperexpanded. The lungs are clear without focal pneumonia, edema, pneumothorax or pleural effusion. Interstitial markings are diffusely coarsened with chronic features. 12 mm nodule identified right upper lung, not seen on prior imaging. Bones are diffusely demineralized. Telemetry leads overlie the chest. IMPRESSION: 12 mm right upper lobe pulmonary nodule. CT chest without contrast recommended to further evaluate. Emphysema. Electronically Signed   By: Misty Stanley M.D.   On: 06/12/2017 20:33   Dg Lumbar Spine Complete  Result Date: 06/12/2017 CLINICAL DATA:  Fall with back pain EXAM: LUMBAR SPINE - COMPLETE 4+ VIEW COMPARISON:  None. FINDINGS: The bones are osteopenic. There is mild right  convex asymmetry of the lumbar spine. There is no acute fracture or listhesis. Vertebral body heights are maintained. Mild degenerative disc disease, greatest at L4-L5. There is extensive atherosclerotic calcification of the aorta. Normal appearance of the sacrum. IMPRESSION: No acute fracture or static subluxation of the lumbar spine. Electronically Signed   By: Ulyses Jarred M.D.   On: 06/12/2017 21:39   Dg Pelvis 1-2 Views  Result Date: 06/12/2017 CLINICAL DATA:  Fall. EXAM: PELVIS - 1-2 VIEW COMPARISON:  09/27/2016 FINDINGS: Bones are diffusely demineralized. SI joints and symphysis pubis unremarkable. Patient is status post right total hip replacement with femoral component incompletely visualized. Degenerative changes are noted in left hip, as before. IMPRESSION: Negative. Electronically Signed   By: Misty Stanley M.D.   On: 06/12/2017 20:31   Ct Head Wo Contrast  Result Date: 06/12/2017 CLINICAL DATA:  Altered level of consciousness following blunt trauma to the face and head. EXAM: CT HEAD WITHOUT CONTRAST CT MAXILLOFACIAL WITHOUT CONTRAST TECHNIQUE: Multidetector CT imaging of the head and maxillofacial structures were performed using the standard protocol without intravenous contrast. Multiplanar CT image reconstructions of the maxillofacial structures were also generated. COMPARISON:  08/02/2016 FINDINGS: CT HEAD FINDINGS Brain: There is focal encephalomalacia involving the right posterior parietal and occipital lobes. This lies underneath a prior craniotomy flap. There is an area bone resorption of the posterior calvarium involving a portion of the craniotomy flap. Within this area of resorption is a soft tissue mass that bulges into the subgaleal soft tissues  and into the extra-axial space adjacent to and involving the posterior, superior sagittal sinus. This mass measures 2.8 x 2.7 cm transversely. On the prior exam, the mass is slightly smaller, measuring 2.6 x 2.5 cm. The amount of bone resorption as slightly increased. There are no parenchymal masses or mass effect. There is no evidence of an acute infarct. There is no intracranial hemorrhage. No other extra-axial abnormality. Ventricles are normal in size and configuration. Patchy white matter hypoattenuation is noted consistent with moderate chronic microvascular ischemic change. Vascular: No hyperdense vessel or unexpected calcification. Skull: No skull fracture.  No other skull lesion. Other: None. CT MAXILLOFACIAL FINDINGS Osseous: No fracture.  No bone lesion. Orbits: Negative. No traumatic or inflammatory finding. Sinuses: Clear sinuses, mastoid air cells and middle ear cavities. Soft tissues: No convincing soft tissue contusion or hematoma. No mass or adenopathy. IMPRESSION: HEAD CT 1. No acute intracranial abnormalities. 2. Status post posterior right occipital/parietal craniotomy reportedly for a meningioma resection. There is bone resorption along the craniotomy flap within which lies an extra-axial mass spanning the defect in the craniotomy flap. This is consistent with residual/recurrent meningioma, which has mildly increased in size from the prior exam. MAXILLOFACIAL CT 1. No fracture or acute finding. Electronically Signed   By: Lajean Manes M.D.   On: 06/12/2017 20:47   Ct Maxillofacial Wo Cm  Result Date: 06/12/2017 CLINICAL DATA:  Altered level of consciousness following blunt trauma to the face and head. EXAM: CT HEAD WITHOUT CONTRAST CT MAXILLOFACIAL WITHOUT CONTRAST TECHNIQUE: Multidetector CT imaging of the head and maxillofacial structures were performed using the standard protocol without intravenous contrast. Multiplanar CT image reconstructions of the maxillofacial structures were also  generated. COMPARISON:  08/02/2016 FINDINGS: CT HEAD FINDINGS Brain: There is focal encephalomalacia involving the right posterior parietal and occipital lobes. This lies underneath a prior craniotomy flap. There is an area bone resorption of the posterior calvarium involving a portion of the craniotomy flap. Within this area of resorption  is a soft tissue mass that bulges into the subgaleal soft tissues and into the extra-axial space adjacent to and involving the posterior, superior sagittal sinus. This mass measures 2.8 x 2.7 cm transversely. On the prior exam, the mass is slightly smaller, measuring 2.6 x 2.5 cm. The amount of bone resorption as slightly increased. There are no parenchymal masses or mass effect. There is no evidence of an acute infarct. There is no intracranial hemorrhage. No other extra-axial abnormality. Ventricles are normal in size and configuration. Patchy white matter hypoattenuation is noted consistent with moderate chronic microvascular ischemic change. Vascular: No hyperdense vessel or unexpected calcification. Skull: No skull fracture.  No other skull lesion. Other: None. CT MAXILLOFACIAL FINDINGS Osseous: No fracture.  No bone lesion. Orbits: Negative. No traumatic or inflammatory finding. Sinuses: Clear sinuses, mastoid air cells and middle ear cavities. Soft tissues: No convincing soft tissue contusion or hematoma. No mass or adenopathy. IMPRESSION: HEAD CT 1. No acute intracranial abnormalities. 2. Status post posterior right occipital/parietal craniotomy reportedly for a meningioma resection. There is bone resorption along the craniotomy flap within which lies an extra-axial mass spanning the defect in the craniotomy flap. This is consistent with residual/recurrent meningioma, which has mildly increased in size from the prior exam. MAXILLOFACIAL CT 1. No fracture or acute finding. Electronically Signed   By: Lajean Manes M.D.   On: 06/12/2017 20:47    Procedures Procedures  (including critical care time)  Medications Ordered in ED Medications  sodium chloride 0.9 % bolus 1,000 mL (0 mLs Intravenous Stopped 06/12/17 2208)  fentaNYL (SUBLIMAZE) injection 12.5 mcg (12.5 mcg Intravenous Given 06/12/17 2107)     Initial Impression / Assessment and Plan / ED Course  I have reviewed the triage vital signs and the nursing notes.  Pertinent labs & imaging results that were available during my care of the patient were reviewed by me and considered in my medical decision making (see chart for details).     10:50 PM Patient resting, having received fentanyl for pain. Initial x-ray, CT, urinalysis, labs discussed with family members. There is some small evidence for dehydration, but otherwise generally reassuring findings, no evidence for intracranial pathology that is new, though there is recurrent meningioma. Patient has unremarkable vitals still. With concern for possible syncope, unclear circumstances of her fall, she will be admitted for continued fluid resuscitation, and observation overnight.   Final Clinical Impressions(s) / ED Diagnoses  Fall Possible syncope Dehydration   Carmin Muskrat, MD 06/12/17 2251

## 2017-06-12 NOTE — ED Notes (Signed)
Pt reported no pain upon arrival, reported severe back pain beginning at 2045. Dr. Vanita Panda notified.

## 2017-06-13 ENCOUNTER — Observation Stay (HOSPITAL_BASED_OUTPATIENT_CLINIC_OR_DEPARTMENT_OTHER): Payer: Medicare Other

## 2017-06-13 ENCOUNTER — Other Ambulatory Visit: Payer: Self-pay

## 2017-06-13 DIAGNOSIS — W19XXXA Unspecified fall, initial encounter: Secondary | ICD-10-CM | POA: Diagnosis not present

## 2017-06-13 DIAGNOSIS — I351 Nonrheumatic aortic (valve) insufficiency: Secondary | ICD-10-CM

## 2017-06-13 DIAGNOSIS — R55 Syncope and collapse: Secondary | ICD-10-CM | POA: Diagnosis not present

## 2017-06-13 DIAGNOSIS — G9341 Metabolic encephalopathy: Secondary | ICD-10-CM | POA: Diagnosis not present

## 2017-06-13 DIAGNOSIS — E43 Unspecified severe protein-calorie malnutrition: Secondary | ICD-10-CM

## 2017-06-13 LAB — TROPONIN I
Troponin I: <0.03 ng/mL (ref <0.03)
Troponin I: <0.03 ng/mL (ref <0.03)

## 2017-06-13 LAB — ECHOCARDIOGRAM COMPLETE
HEIGHTINCHES: 64 in
Weight: 1744 oz

## 2017-06-13 LAB — MAGNESIUM: Magnesium: 1.5 mg/dL — ABNORMAL LOW (ref 1.7–2.4)

## 2017-06-13 MED ORDER — METOPROLOL SUCCINATE ER 25 MG PO TB24
12.5000 mg | ORAL_TABLET | Freq: Every day | ORAL | Status: DC
  Administered 2017-06-14: 12.5 mg via ORAL
  Filled 2017-06-13: qty 1

## 2017-06-13 MED ORDER — SODIUM CHLORIDE 0.9 % IV SOLN
1.0000 g | Freq: Every day | INTRAVENOUS | Status: DC
  Administered 2017-06-13 (×2): 1 g via INTRAVENOUS
  Filled 2017-06-13 (×3): qty 10

## 2017-06-13 MED ORDER — MAGNESIUM SULFATE 2 GM/50ML IV SOLN
2.0000 g | Freq: Once | INTRAVENOUS | Status: AC
  Administered 2017-06-13: 2 g via INTRAVENOUS
  Filled 2017-06-13: qty 50

## 2017-06-13 MED ORDER — ENSURE ENLIVE PO LIQD
237.0000 mL | Freq: Two times a day (BID) | ORAL | Status: DC
  Administered 2017-06-14: 237 mL via ORAL

## 2017-06-13 NOTE — Progress Notes (Signed)
Received report from Sarah,RN in the ED 

## 2017-06-13 NOTE — Progress Notes (Signed)
Patient transferred from ED to [phone]. Patient A&Ox3. Telemetry box 07 applied and second verified. Skin assessment completed by 2 RNs. Patient instructed how to use the callbell before getting out of bed. Callbell within reach. Admission handbook given to patient. Will continue to monitor and treat per MD orders.

## 2017-06-13 NOTE — Progress Notes (Signed)
[facility] Progress Note  Patient: [name] LFY:[id number]   PCP: Marletta Lor, MD DOB: 1923-01-15   DOA: 06/12/2017   DOS: 06/13/2017   Date of Service: the patient was seen and examined on 06/13/2017  Subjective: Feeling bad, no nausea no vomiting only complaint is fatigue.  No chest pain no abdominal pain.  No diarrhea reported.  Brief hospital course: Pt. with PMH of HTN, CAD, hypothyroidism, meningioma S/P resection 2008,?  CVA history; admitted on 06/12/2017, presented with complaint of fall unwitnessed, was found to have possible UTI. Currently further plan is further work-up.  Assessment and Plan: 1. Fall syncope PACs Echocardiogram unremarkable. Neurological examination reveals bilateral ptosis. CT scan of the head denies any acute abnormality mentions there is mild increase in the size of the prior meningioma. No seizures here in the hospital. Initially thought to be having UTI, urine culture currently pending. Orthostatic even though incomplete unremarkable. Get MRI to rule out any acute abnormality. Patient has occasional palpitation and on telemetry mentions about patient having some tachycardia which is been labeled as A. fib although on my evaluation I see P waves and therefore it appears PAC with sinus tachycardia. Started the patient on low-dose Toprol.  2.  Metabolic encephalopathy. UTI. On IV ceftriaxone. Monitor.  3.  Pulmonary nodule. Outpatient CT scan.  4.  Hypothyroidism. Continue methimazole.   Diet: cardiac diet DVT Prophylaxis: subcutaneous Heparin  Advance goals of care discussion: DNR DNI  Family Communication: family was present at bedside, at the time of interview. The pt provided permission to discuss medical plan with the family. Opportunity was given to ask question and all questions were answered satisfactorily.   Disposition:  Discharge to home.  Consultants: none Procedures: Echocardiogram    Antibiotics: Anti-infectives (From admission, onward)   Start     Dose/Rate Route Frequency Ordered Stop   06/13/17 0300  cefTRIAXone (ROCEPHIN) 1 g in sodium chloride 0.9 % 100 mL IVPB     1 g 200 mL/hr over 30 Minutes Intravenous Daily at bedtime 06/13/17 0239         Objective: Physical Exam: Vitals:   06/13/17 0315 06/13/17 0345 06/13/17 0437 06/13/17 1504  BP: (!) 165/75 (!) 154/70 (!) 150/95 120/60  Pulse: 66 69 76 62  Resp: (!) 25 (!) 24 18 20   Temp:   (!) 97.5 F (36.4 C) 99.3 F (37.4 C)  TempSrc:   Oral Oral  SpO2: 95% 95% 96% 95%  Weight:      Height:        Intake/Output Summary (Last 24 hours) at 06/13/2017 1731 Last data filed at 06/13/2017 1223 Gross per 24 hour  Intake 160 ml  Output 400 ml  Net -240 ml   Filed Weights   06/12/17 1901 06/13/17 0027  Weight: 49.4 kg (109 lb) 49.4 kg (109 lb)   General: Alert, Awake and Oriented to Time, Place and Person. Appear in mild distress, affect appropriate Eyes: PERRL, Conjunctiva normal ENT: Oral Mucosa clear moist. Neck: no JVD, no Abnormal Mass Or lumps Cardiovascular: S1 and S2 Present, no Murmur, Peripheral Pulses Present Respiratory: normal respiratory effort, Bilateral Air entry equal and Decreased, no use of accessory muscle, Clear to Auscultation, no Crackles, no wheezes Abdomen: Bowel Sound present, Soft and no tenderness, no hernia Skin: no redness, no Rash, no induration Extremities: no Pedal edema, no calf tenderness Neurologic: Grossly no focal neuro deficit. Bilaterally Equal motor strength  Data Reviewed: CBC: Recent Labs  Lab 06/12/17 1954  WBC 11.1*  NEUTROABS 9.6*  HGB 12.3  HCT 37.8  MCV 92.2  PLT 106   Basic Metabolic Panel: Recent Labs  Lab 06/12/17 1954 06/13/17 0224  NA 136  --   K 4.1  --   CL 100*  --   CO2 27  --   GLUCOSE 173*  --   BUN 15  --   CREATININE 0.95  --   CALCIUM 9.0  --   MG  --  1.5*    Liver Function Tests: Recent Labs  Lab 06/12/17 1954   AST 26  ALT 15  ALKPHOS 89  BILITOT 0.8  PROT 6.3*  ALBUMIN 3.7   Recent Labs  Lab 06/12/17 1954  LIPASE 35   No results for input(s): AMMONIA in the last 168 hours. Coagulation Profile: No results for input(s): INR, PROTIME in the last 168 hours. Cardiac Enzymes: Recent Labs  Lab 06/12/17 1954 06/13/17 0224 06/13/17 0733  TROPONINI <0.03 <0.03 <0.03   BNP (last 3 results) No results for input(s): PROBNP in the last 8760 hours. CBG: No results for input(s): GLUCAP in the last 168 hours. Studies: Dg Chest 2 View  Result Date: 06/12/2017 CLINICAL DATA:  Fall. EXAM: CHEST - 2 VIEW COMPARISON:  08/13/2013. FINDINGS: Lungs are hyperexpanded. The lungs are clear without focal pneumonia, edema, pneumothorax or pleural effusion. Interstitial markings are diffusely coarsened with chronic features. 12 mm nodule identified right upper lung, not seen on prior imaging. Bones are diffusely demineralized. Telemetry leads overlie the chest. IMPRESSION: 12 mm right upper lobe pulmonary nodule. CT chest without contrast recommended to further evaluate. Emphysema. Electronically Signed   By: Misty Stanley M.D.   On: 06/12/2017 20:33   Dg Lumbar Spine Complete  Result Date: 06/12/2017 CLINICAL DATA:  Fall with back pain EXAM: LUMBAR SPINE - COMPLETE 4+ VIEW COMPARISON:  None. FINDINGS: The bones are osteopenic. There is mild right convex asymmetry of the lumbar spine. There is no acute fracture or listhesis. Vertebral body heights are maintained. Mild degenerative disc disease, greatest at L4-L5. There is extensive atherosclerotic calcification of the aorta. Normal appearance of the sacrum. IMPRESSION: No acute fracture or static subluxation of the lumbar spine. Electronically Signed   By: Ulyses Jarred M.D.   On: 06/12/2017 21:39   Dg Pelvis 1-2 Views  Result Date: 06/12/2017 CLINICAL DATA:  Fall. EXAM: PELVIS - 1-2 VIEW COMPARISON:  09/27/2016 FINDINGS: Bones are diffusely demineralized. SI joints  and symphysis pubis unremarkable. Patient is status post right total hip replacement with femoral component incompletely visualized. Degenerative changes are noted in left hip, as before. IMPRESSION: Negative. Electronically Signed   By: Misty Stanley M.D.   On: 06/12/2017 20:31   Ct Head Wo Contrast  Result Date: 06/12/2017 CLINICAL DATA:  Altered level of consciousness following blunt trauma to the face and head. EXAM: CT HEAD WITHOUT CONTRAST CT MAXILLOFACIAL WITHOUT CONTRAST TECHNIQUE: Multidetector CT imaging of the head and maxillofacial structures were performed using the standard protocol without intravenous contrast. Multiplanar CT image reconstructions of the maxillofacial structures were also generated. COMPARISON:  08/02/2016 FINDINGS: CT HEAD FINDINGS Brain: There is focal encephalomalacia involving the right posterior parietal and occipital lobes. This lies underneath a prior craniotomy flap. There is an area bone resorption of the posterior calvarium involving a portion of the craniotomy flap. Within this area of resorption is a soft tissue mass that bulges into the subgaleal soft tissues and into the extra-axial space adjacent to and involving the posterior, superior sagittal sinus. This  mass measures 2.8 x 2.7 cm transversely. On the prior exam, the mass is slightly smaller, measuring 2.6 x 2.5 cm. The amount of bone resorption as slightly increased. There are no parenchymal masses or mass effect. There is no evidence of an acute infarct. There is no intracranial hemorrhage. No other extra-axial abnormality. Ventricles are normal in size and configuration. Patchy white matter hypoattenuation is noted consistent with moderate chronic microvascular ischemic change. Vascular: No hyperdense vessel or unexpected calcification. Skull: No skull fracture.  No other skull lesion. Other: None. CT MAXILLOFACIAL FINDINGS Osseous: No fracture.  No bone lesion. Orbits: Negative. No traumatic or inflammatory  finding. Sinuses: Clear sinuses, mastoid air cells and middle ear cavities. Soft tissues: No convincing soft tissue contusion or hematoma. No mass or adenopathy. IMPRESSION: HEAD CT 1. No acute intracranial abnormalities. 2. Status post posterior right occipital/parietal craniotomy reportedly for a meningioma resection. There is bone resorption along the craniotomy flap within which lies an extra-axial mass spanning the defect in the craniotomy flap. This is consistent with residual/recurrent meningioma, which has mildly increased in size from the prior exam. MAXILLOFACIAL CT 1. No fracture or acute finding. Electronically Signed   By: Lajean Manes M.D.   On: 06/12/2017 20:47   Ct Maxillofacial Wo Cm  Result Date: 06/12/2017 CLINICAL DATA:  Altered level of consciousness following blunt trauma to the face and head. EXAM: CT HEAD WITHOUT CONTRAST CT MAXILLOFACIAL WITHOUT CONTRAST TECHNIQUE: Multidetector CT imaging of the head and maxillofacial structures were performed using the standard protocol without intravenous contrast. Multiplanar CT image reconstructions of the maxillofacial structures were also generated. COMPARISON:  08/02/2016 FINDINGS: CT HEAD FINDINGS Brain: There is focal encephalomalacia involving the right posterior parietal and occipital lobes. This lies underneath a prior craniotomy flap. There is an area bone resorption of the posterior calvarium involving a portion of the craniotomy flap. Within this area of resorption is a soft tissue mass that bulges into the subgaleal soft tissues and into the extra-axial space adjacent to and involving the posterior, superior sagittal sinus. This mass measures 2.8 x 2.7 cm transversely. On the prior exam, the mass is slightly smaller, measuring 2.6 x 2.5 cm. The amount of bone resorption as slightly increased. There are no parenchymal masses or mass effect. There is no evidence of an acute infarct. There is no intracranial hemorrhage. No other extra-axial  abnormality. Ventricles are normal in size and configuration. Patchy white matter hypoattenuation is noted consistent with moderate chronic microvascular ischemic change. Vascular: No hyperdense vessel or unexpected calcification. Skull: No skull fracture.  No other skull lesion. Other: None. CT MAXILLOFACIAL FINDINGS Osseous: No fracture.  No bone lesion. Orbits: Negative. No traumatic or inflammatory finding. Sinuses: Clear sinuses, mastoid air cells and middle ear cavities. Soft tissues: No convincing soft tissue contusion or hematoma. No mass or adenopathy. IMPRESSION: HEAD CT 1. No acute intracranial abnormalities. 2. Status post posterior right occipital/parietal craniotomy reportedly for a meningioma resection. There is bone resorption along the craniotomy flap within which lies an extra-axial mass spanning the defect in the craniotomy flap. This is consistent with residual/recurrent meningioma, which has mildly increased in size from the prior exam. MAXILLOFACIAL CT 1. No fracture or acute finding. Electronically Signed   By: Lajean Manes M.D.   On: 06/12/2017 20:47    Scheduled Meds: . enoxaparin (LOVENOX) injection  30 mg Subcutaneous Daily  . feeding supplement (ENSURE ENLIVE)  237 mL Oral BID BM  . fluticasone  2 spray Each Nare Daily  .  methimazole  5 mg Oral Once per day on Mon Wed Fri  . [START ON 06/14/2017] metoprolol succinate  12.5 mg Oral Daily  . pravastatin  20 mg Oral QPM   Continuous Infusions: . cefTRIAXone (ROCEPHIN)  IV Stopped (06/13/17 0440)   PRN Meds: ondansetron **OR** ondansetron (ZOFRAN) IV, polyethylene glycol  Time spent: 35 minutes  Author: Berle Mull, MD [facility] Pager: [phone] 06/13/2017 5:31 PM  If 7PM-7AM, please contact night-coverage at [email or web address], password [facility]

## 2017-06-13 NOTE — Evaluation (Signed)
Physical Therapy Evaluation Patient Details Name: [name] MRN: [id number] DOB: [birth year] Today's Date: 06/13/2017   History of Present Illness  84 y.o. female with medical history significant for HTN, Osteoathritis, cardiac hypothyroidism, meningioma, was brought to the ED via EMS with reports of a fall.  Clinical Impression  Pt admitted with above diagnosis. Pt currently with functional limitations due to the deficits listed below (see PT Problem List). Pt ambulated 57' with RW and min/guard assist for safety. 24* supervision recommended due to fall risk.  Pt will benefit from skilled PT to increase their independence and safety with mobility to allow discharge to the venue listed below.       Follow Up Recommendations SNF;Supervision/Assistance - 24 hour;Supervision for mobility/OOB; vs HHPT with 24* supervision from family    Equipment Recommendations  None recommended by PT    Recommendations for Other Services       Precautions / Restrictions Precautions Precautions: Fall Precaution Comments: admitted with fall Restrictions Weight Bearing Restrictions: No      Mobility  Bed Mobility Overal bed mobility: Needs Assistance Bed Mobility: Supine to Sit     Supine to sit: Mod assist     General bed mobility comments: assist to raise trunk  Transfers Overall transfer level: Needs assistance Equipment used: 1 person hand held assist Transfers: Sit to/from Stand;Stand Pivot Transfers Sit to Stand: Min assist Stand pivot transfers: Min assist       General transfer comment: steadying assist to pivot to 3 in 1  Ambulation/Gait Ambulation/Gait assistance: Min guard Ambulation Distance (Feet): 90 Feet Assistive device: Rolling walker (2 wheeled) Gait Pattern/deviations: Step-through pattern;Decreased stride length     General Gait Details: distance limited by fatigue, HR 129 with walking, no loss of balance  Stairs            Wheelchair Mobility     Modified Rankin (Stroke Patients Only)       Balance Overall balance assessment: History of Falls;Needs assistance   Sitting balance-Leahy Scale: Fair     Standing balance support: Single extremity supported Standing balance-Leahy Scale: Poor Standing balance comment: relies on single UE support                             Pertinent Vitals/Pain Pain Assessment: Faces Faces Pain Scale: Hurts even more Pain Location: R lower neck Pain Descriptors / Indicators: Grimacing;Guarding Pain Intervention(s): Limited activity within patient's tolerance;Monitored during session;Patient requesting pain meds-RN notified;Heat applied    Home Living Family/patient expects to be discharged to:: Private residence Living Arrangements: Alone             Home Equipment: Gilford Rile - 2 wheels      Prior Function           Comments: per chart, daughter assists with ADLs daily, pt reported she walks with a RW     Hand Dominance        Extremity/Trunk Assessment   Upper Extremity Assessment Upper Extremity Assessment: Overall WFL for tasks assessed    Lower Extremity Assessment Lower Extremity Assessment: Overall WFL for tasks assessed    Cervical / Trunk Assessment Cervical / Trunk Assessment: Normal  Communication   Communication: HOH  Cognition Arousal/Alertness: Awake/alert Behavior During Therapy: WFL for tasks assessed/performed Overall Cognitive Status: No family/caregiver present to determine baseline cognitive functioning  General Comments: inconsistent with answering questions, didn't respond when asked to state her birthdate, pt did report she needed to use the bathroom, poor historian      General Comments      Exercises     Assessment/Plan    PT Assessment Patient needs continued PT services  PT Problem List Decreased balance;Decreased activity tolerance;Pain       PT Treatment Interventions  Gait training;Functional mobility training;Therapeutic activities;Therapeutic exercise;Patient/family education;Balance training    PT Goals (Current goals can be found in the Care Plan section)  Acute Rehab PT Goals Patient Stated Goal: to get home to her 3 dogs PT Goal Formulation: With patient Time For Goal Achievement: 06/27/17 Potential to Achieve Goals: Good    Frequency Min 3X/week   Barriers to discharge        Co-evaluation               AM-PAC PT "6 Clicks" Daily Activity  Outcome Measure Difficulty turning over in bed (including adjusting bedclothes, sheets and blankets)?: A Little Difficulty moving from lying on back to sitting on the side of the bed? : Unable Difficulty sitting down on and standing up from a chair with arms (e.g., wheelchair, bedside commode, etc,.)?: A Little Help needed moving to and from a bed to chair (including a wheelchair)?: A Little Help needed walking in hospital room?: A Little Help needed climbing 3-5 steps with a railing? : A Lot 6 Click Score: 15    End of Session Equipment Utilized During Treatment: Gait belt Activity Tolerance: Patient tolerated treatment well Patient left: in chair;with call bell/phone within reach;with chair alarm set Nurse Communication: Mobility status PT Visit Diagnosis: History of falling (Z91.81);Difficulty in walking, not elsewhere classified (R26.2);Pain Pain - Right/Left: Right Pain - part of body: Shoulder    Time: 3419-6222 PT Time Calculation (min) (ACUTE ONLY): 52 min   Charges:   PT Evaluation $PT Eval Low Complexity: 1 Low PT Treatments $Gait Training: 8-22 mins $Therapeutic Activity: 8-22 mins   PT G Codes:          Philomena Doheny 06/13/2017, 10:16 AM [phone]

## 2017-06-13 NOTE — ED Notes (Signed)
Pt request nurse collect blood from iv.

## 2017-06-13 NOTE — Progress Notes (Signed)
Initial Nutrition Assessment  DOCUMENTATION CODES:   Severe malnutrition in context of social or environmental circumstances  INTERVENTION:   - Ensure Enlive po BID, each supplement provides 350 kcal and 20 grams of protein  - Magic cup TID with meals, each supplement provides 290 kcal and 9 grams of protein  - Encourage PO intake  NUTRITION DIAGNOSIS:   Severe Malnutrition related to social / environmental circumstances(pt lives alone and has some memory deficits) as evidenced by mild fat depletion, moderate fat depletion, severe fat depletion, mild muscle depletion, moderate muscle depletion, severe muscle depletion.  GOAL:   Patient will meet greater than or equal to 90% of their needs  MONITOR:   PO intake, Supplement acceptance, Labs, Weight trends  REASON FOR ASSESSMENT:   Malnutrition Screening Tool    ASSESSMENT:   84 year old female who presented to ED after having a mechanical fall. PMH significant for hypertension, osteoarthritis, cardiac hypothyroidism, and meningioma.  Spoke with pt briefly at bedside. No family present. Pt is poor historian and was drowsy during RD visit. Pt states she usually eats 3-4 times daily but was unable to provide specific information. Pt is unsure of her UBW or whether she has lost weight. Per weight history in chart, pt has experienced a 7.1% weight loss over the past 8 months which is insignificant for timeframe. Pt states her appetite is poor at this time. Noted unfinished meal tray in room at time of visit. Meal completion charted as 10%.  Medications reviewed and include: 20 mg pravastatin, 2 grams IV magnesium sulfate once  Labs reviewed: magnesium 1.5 (L)  NUTRITION - FOCUSED PHYSICAL EXAM:    Most Recent Value  Orbital Region  Severe depletion  Upper Arm Region  Mild depletion  Thoracic and Lumbar Region  Moderate depletion  Buccal Region  Severe depletion  Temple Region  Severe depletion  Clavicle Bone Region  Severe  depletion  Clavicle and Acromion Bone Region  Severe depletion  Scapular Bone Region  Severe depletion  Dorsal Hand  Mild depletion  Patellar Region  Severe depletion  Anterior Thigh Region  Severe depletion  Posterior Calf Region  Moderate depletion  Edema (RD Assessment)  None  Hair  Reviewed  Eyes  Reviewed  Mouth  Reviewed  Skin  Reviewed  Nails  Reviewed      Diet Order:   Diet Order           Diet Heart Room service appropriate? Yes; Fluid consistency: Thin  Diet effective now          EDUCATION NEEDS:   Not appropriate for education at this time  Skin:  Skin Assessment: Reviewed RN Assessment  Last BM:  unknown/PTA  Height:   Ht Readings from Last 1 Encounters:  06/13/17 5\' 4"  (1.626 m)    Weight:   Wt Readings from Last 1 Encounters:  06/13/17 109 lb (49.4 kg)    Ideal Body Weight:  54.5 kg  BMI:  Body mass index is 18.71 kg/m.  Estimated Nutritional Needs:   Kcal:  1300-1500 kcal/day  Protein:  60-75 grams/day  Fluid:  >/= 1.5 L/day    Gaynell Face, MS, RD, LDN Pager: [phone] Weekend/After Hours: [phone]

## 2017-06-13 NOTE — Progress Notes (Signed)
CSW received consult regarding PT recommendation of SNF at discharge.  Patient and daughter are refusing SNF.   Patient's daughter said she took care of her sister before she passed away and sister had been in SNFs several times with bad results. She states that patient will be coming to stay with her and requests home health. She stated they had all equipment and does not have a preference on which company. Her address is [address]. Daughter is on the way to the hospital. RNCM updated.  CSW signing off.   Percell Locus Kentley Blyden LCSW [phone]

## 2017-06-13 NOTE — Care Management Note (Addendum)
Case Management Note  Patient Details  Name: [name] MRN: [id number] Date of Birth: March 21, 1922  Subjective/Objective:               Spoke w patient's daughter at bedside. She will take patient home. She has extensive history caring for ailing spouse, sister and father. She wants to take patient home. Patient agrees. They state do not need any DME, they have it all from previous family members. They would like to use [facility], referral accepted by [facility] w [facility]. Requested [place] OT RN [facility] SW orders from Dr Posey Pronto.     Patient will DC to [address], [place]   Action/Plan:   Expected Discharge Date:                  Expected Discharge Plan:  [place]  In-House Referral:  Clinical Social Work  Discharge planning Services  CM Consult  Post Acute Care Choice:  Home Health Choice offered to:  Adult Children  DME Arranged:    DME Agency:     HH Arranged:  RN, OT, PT, Nurse's Aide, Social Work [employer] Agency:  [place]  Status of Service:  Completed, signed off  If discussed at [employer], dates discussed:    Additional Comments:  Sarah Collet, RN 06/13/2017, 1:27 PM

## 2017-06-14 ENCOUNTER — Observation Stay (HOSPITAL_COMMUNITY): Payer: Medicare Other

## 2017-06-14 ENCOUNTER — Encounter (HOSPITAL_COMMUNITY): Payer: Self-pay | Admitting: Radiology

## 2017-06-14 DIAGNOSIS — G9341 Metabolic encephalopathy: Secondary | ICD-10-CM | POA: Diagnosis not present

## 2017-06-14 DIAGNOSIS — R55 Syncope and collapse: Secondary | ICD-10-CM | POA: Diagnosis not present

## 2017-06-14 DIAGNOSIS — W19XXXA Unspecified fall, initial encounter: Secondary | ICD-10-CM | POA: Diagnosis not present

## 2017-06-14 DIAGNOSIS — R5383 Other fatigue: Secondary | ICD-10-CM | POA: Diagnosis not present

## 2017-06-14 LAB — COMPREHENSIVE METABOLIC PANEL
ALBUMIN: 3.1 g/dL — AB (ref 3.5–5.0)
ALT: 12 U/L — ABNORMAL LOW (ref 14–54)
AST: 18 U/L (ref 15–41)
Alkaline Phosphatase: 74 U/L (ref 38–126)
Anion gap: 8 (ref 5–15)
BILIRUBIN TOTAL: 0.7 mg/dL (ref 0.3–1.2)
BUN: 10 mg/dL (ref 6–20)
CO2: 27 mmol/L (ref 22–32)
Calcium: 8.5 mg/dL — ABNORMAL LOW (ref 8.9–10.3)
Chloride: 104 mmol/L (ref 101–111)
Creatinine, Ser: 0.84 mg/dL (ref 0.44–1.00)
GFR calc non Af Amer: 58 mL/min — ABNORMAL LOW (ref >60)
Glucose, Bld: 104 mg/dL — ABNORMAL HIGH (ref 65–99)
Potassium: 3.6 mmol/L (ref 3.5–5.1)
Sodium: 139 mmol/L (ref 135–145)
Total Protein: 5.9 g/dL — ABNORMAL LOW (ref 6.5–8.1)

## 2017-06-14 LAB — CBC WITH DIFFERENTIAL/PLATELET
BASOS ABS: 0 10*3/uL (ref 0.0–0.1)
BASOS PCT: 0 %
EOS ABS: 0.1 10*3/uL (ref 0.0–0.7)
EOS PCT: 1 %
HEMATOCRIT: 33.5 % — AB (ref 36.0–46.0)
Hemoglobin: 10.7 g/dL — ABNORMAL LOW (ref 12.0–15.0)
Lymphocytes Relative: 11 %
Lymphs Abs: 1 10*3/uL (ref 0.7–4.0)
MCH: 29.3 pg (ref 26.0–34.0)
MCHC: 31.9 g/dL (ref 30.0–36.0)
MCV: 91.8 fL (ref 78.0–100.0)
MONO ABS: 1.5 10*3/uL — AB (ref 0.1–1.0)
MONOS PCT: 16 %
Neutro Abs: 6.6 10*3/uL (ref 1.7–7.7)
Neutrophils Relative %: 72 %
PLATELETS: 170 10*3/uL (ref 150–400)
RBC: 3.65 MIL/uL — ABNORMAL LOW (ref 3.87–5.11)
RDW: 14.5 % (ref 11.5–15.5)
WBC: 9.2 10*3/uL (ref 4.0–10.5)

## 2017-06-14 LAB — MAGNESIUM: Magnesium: 1.8 mg/dL (ref 1.7–2.4)

## 2017-06-14 LAB — URINE CULTURE

## 2017-06-14 MED ORDER — RISPERIDONE 0.25 MG PO TABS: 0.2500 mg | ORAL_TABLET | Freq: Two times a day (BID) | ORAL | 0 refills | Status: DC | PRN

## 2017-06-14 MED ORDER — CEPHALEXIN 500 MG PO CAPS: 500.0000 mg | ORAL_CAPSULE | Freq: Two times a day (BID) | ORAL | 0 refills | Status: AC

## 2017-06-14 MED ORDER — ENSURE ENLIVE PO LIQD: 237.0000 mL | Freq: Two times a day (BID) | ORAL | 12 refills | Status: DC

## 2017-06-14 MED ORDER — POLYETHYLENE GLYCOL 3350 17 G PO PACK: 17.0000 g | PACK | Freq: Every day | ORAL | 0 refills | Status: DC | PRN

## 2017-06-14 MED ORDER — LORAZEPAM 2 MG/ML IJ SOLN: 0.5000 mg | INTRAMUSCULAR | Status: DC | PRN

## 2017-06-14 MED ORDER — METOPROLOL SUCCINATE ER 25 MG PO TB24: 12.5000 mg | ORAL_TABLET | Freq: Every day | ORAL | 0 refills | Status: DC

## 2017-06-14 NOTE — Progress Notes (Signed)
When transport came to take patient to MRI she continuously refused to go saying ,"I don't need that. I'm fine." As a result, transport left and said they would try again Saturday morning when patient's daughter present.

## 2017-06-14 NOTE — Care Management Obs Status (Signed)
[place] NOTIFICATION   Patient Details  Name: [name] MRN: [id number] Date of Birth: 1922-09-06   Medicare Observation Status Notification Given:  Yes    Bethena Roys, RN 06/14/2017, 12:18 PM

## 2017-06-14 NOTE — Progress Notes (Signed)
Upon entering the room to check on patient, patient was removing her telemetry box. She said, "A lady came into my room and told me I could take the box off. I don't need it." RN explained that patient needs the tele box in order for [place] to monitor her heart rate and rhythm and so the doctor can analyze the rhythm.  RN attempted to reapply telemetry monitoring back to patient and patient continuously refused for the telemetry box to be reapplied. Patient said, "Since you did not tell me to take off the box, I am not going to let you put it back on.  The lady who told me I could take it off should be the person to put it back on." RN repeatedly tried to reapply telemetry but was unsuccessful. RN explained to patient that she was just trying to help her and patient ignored the RN and stated ,"You are only trying to help yourself." Patient would not allow telemetry to be reapplied. Patient continues to seem more confused now than prior to midnight. RN notified Bodenheimer,NP and he discontinued telemetry for this patient.

## 2017-06-14 NOTE — Progress Notes (Signed)
Overnight patient continuously thought she was in her own home and wanted to always get out of bed. She tried to pull out her IV and rip off the dressing applied around the IV. Patient tried ripping off DNR bracelet. RN explained to patient the reasons for the arm bracelets and pt would not listen and kept repeating "I don't need these. The doctor did not place these." Patient refused to listen to RN and other nursing staff. Patient currently resting in bed. Will continue to monitor and treat per MD orders.

## 2017-06-14 NOTE — Care Management (Signed)
1300 06-14-17 CM did speak with daughter and she was in search of a safety mat (falls) for home. CM did ask [facility] if they have in stock and they do not carry. CM did call [facility] and they can order. Price $168.00- Insurance can not be billed. No further needs from CM at this time. Bethena Roys, RN,BSN Case Manager [phone]

## 2017-06-14 NOTE — Progress Notes (Signed)
Patient discharged home after IV removed. Daughter given discahrge instructions. Home health and DME available at homw post discharge.

## 2017-06-16 NOTE — Discharge Summary (Signed)
[facility] Discharge Summary   Patient: [name] WUJ:[id number]   PCP: Marletta Lor, MD DOB: 04/05/1922   Date of admission: 06/12/2017   Date of discharge: 06/14/2017    Discharge Diagnoses:  Active Problems:   Essential hypertension   Osteoarthritis   MENINGIOMA   Bradycardia   Hyperthyroidism   Fall   Protein-calorie malnutrition, severe   Admitted From: home Disposition:  Home with  Home health  Recommendations for Outpatient Follow-up:  1. Please follow-up with PCP in 1 week, consider transition to hospice. 2. Recommended to follow-up with neurosurgery for further work-up on reoccurrence-residual meningioma  Follow-up Information    [facility].   Specialty:  Home Health Services Why:  For home health services. They will call you in the next 1-2 days to start home health services.  Contact information: [place] [address] [phone]        Marletta Lor, MD. Schedule an appointment as soon as possible for a visit in 1 week(s).   Specialty:  Internal Medicine Contact information: [place] [address] [phone]        Milas Gain., MD. Call in 1 week(s).   Specialty:  Neurosurgery Contact information: [place] [address] [phone]          Diet recommendation: Dysphagia 3 diet  Activity: The patient is advised to gradually reintroduce usual activities.  Discharge Condition: good  Code Status: DNR/DNI  History of present illness: As per the H and P dictated on admission, "[name] is a 84 y.o. female with medical history significant for HTN, Osteoathritis, cardiac hypothyroidism, meningioma, was brought to the ED via EMS with reports of a fall.  She lives alone.  Ambulates with walker at baseline but with a cane in the kitchen.  Patient fell, she cannot remember the details, she pressed her medic alert  button.  Patient was found on the floor, the kitchen, with her cane.  Patient's daughter who is present at bedside helps with most of the history, patient answers intermittently, daughter reports earlier in the day patient was complaining of lower left chest/rib pain, but no SOB.  Also patient's daughter notes progressive external decline/weakness over the past few weeks. Until 2 days ago patient had maintained good p.o. Intake.  Daughter checks on patient every day, bathes patient, and helps with some activities of daily living.  At baseline she has mild/minimal memory deficits. No vomiting, no known loose stools."  Hospital Course:  Summary of her active problems in the hospital is as following. 1. Fall syncope PACs Echocardiogram unremarkable. Neurological examination reveals bilateral ptosis. CT scan of the head denies any acute abnormality mentions there is mild increase in the size of the prior meningioma. No seizures here in the hospital. Initially thought to be having UTI, urine culture currently growing no significant organism.  Patient was started on IV ceftriaxone will complete antibiotic course for a total of 5 days. Orthostatic even though incomplete unremarkable. MRI brain is negative for any acute stroke. It does show presence of progressive meningioma.  No significant compression of the surrounding structure or edema Patient has occasional palpitation and on telemetry shows about patient having some tachycardia which is been labeled as A. fib although on my evaluation I see P waves and therefore it appears PAC with sinus tachycardia. Started the patient on low-dose Toprol.  2.    Acute metabolic encephalopathy. UTI. History of dementia  History of meningioma S/P gamma knife resection with recurrence On IV ceftriaxone.  Change to oral antibiotics. Suspect that this is actually progressive dementia as opposed to delirium secondary to UTI and patient with dementia. Recommended  family to anticipate further changes down the road with regards to patient's mental status. Patient has shown occasional agitation in the hospital as well and therefore we will discharge her on oral Risperdal PRN. Recommend patient's family to follow-up with PCP and have a discussion regarding goals of care although I recommended that consideration of a hospice is a good idea given that the patient also has evidence of progression of her meningioma. No seizures identified.  3.  Pulmonary nodule. Outpatient CT scan.  Although goals of care discussion with PCP recommended before that  4.  Hypothyroidism. Continue methimazole.  All other chronic medical condition were stable during the hospitalization.  Patient was seen by physical therapy, who recommended SNF, family chose to take the patient home with home health which was arranged by Education officer, museum and case Freight forwarder. On the day of the discharge the patient's vitals were stable , and no other acute medical condition were reported by patient. the patient was felt safe to be discharge at home with home health.  Consultants: none Procedures: none  DISCHARGE MEDICATION: Allergies as of 06/14/2017      Reactions   Atorvastatin Other (See Comments)   Raises liver enzymes (takes pravastatin at home)   Influenza Vaccines Other (See Comments)   Unknown   Phenytoin Other (See Comments)   Blood clots   Promethazine Hcl Other (See Comments)   Severe sedation   Sulfamethoxazole    REACTION: unspecified Per pt daughter she stated this causes rash      Medication List    STOP taking these medications   isosorbide mononitrate 30 MG 24 hr tablet Commonly known as:  IMDUR     TAKE these medications   acetaminophen 325 MG tablet Commonly known as:  TYLENOL Take 325 mg by mouth every 6 (six) hours as needed for mild pain or moderate pain.   cephALEXin 500 MG capsule Commonly known as:  KEFLEX Take 1 capsule (500 mg total) by mouth 2 (two)  times daily for 2 days.   cyanocobalamin 1000 MCG/ML injection Commonly known as:  (VITAMIN B-12) INJECT 1 ML (1000 MCG TOTAL) INTRAMUSCULARLY EVERY 30 DAYS   cycloSPORINE 0.05 % ophthalmic emulsion Commonly known as:  RESTASIS 1 drop 2 (two) times daily.   feeding supplement (ENSURE ENLIVE) Liqd Take 237 mLs by mouth 2 (two) times daily between meals.   fluticasone 50 MCG/ACT nasal spray Commonly known as:  FLONASE Place 2 sprays into both nostrils daily.   methimazole 5 MG tablet Commonly known as:  TAPAZOLE TAKE 1 TABLET THREE TIMES A WEEK, MONDAY, WEDNESDAY, AND FRIDAY What changed:  See the new instructions.   metoprolol succinate 25 MG 24 hr tablet Commonly known as:  TOPROL-XL Take 0.5 tablets (12.5 mg total) by mouth daily.   NEEDLE (DISP) 25 G 25G X 5/8" Misc Commonly known as:  BD DISP NEEDLES USE AS DIRECTED ONCE A MONTH FOR B12 INJECTIONS   nitroGLYCERIN 0.4 MG SL tablet Commonly known as:  NITROSTAT Place 1 tablet (0.4 mg total) under the tongue every 5 (five) minutes as needed. For chest pain.   OVER THE COUNTER MEDICATION Take 1 Package by mouth See admin instructions. Take 1 pack of [employer] advocare by mouth everyday for energy   polyethylene glycol packet Commonly known  as:  MIRALAX / GLYCOLAX Take 17 g by mouth daily as needed for mild constipation.   pravastatin 20 MG tablet Commonly known as:  PRAVACHOL TAKE 1 TABLET DAILY IN THE EVENING What changed:    how much to take  how to take this  when to take this   PREMARIN vaginal cream Generic drug:  conjugated estrogens USE 0.5 GRAM VAGINALLY TWICE A WEEK AS NEEDED   risperiDONE 0.25 MG tablet Commonly known as:  RISPERDAL Take 1 tablet (0.25 mg total) by mouth 2 (two) times daily as needed (agitation).   Syringe (Disposable) 3 ML Misc Commonly known as:  B-D SYRINGE LUER-LOK 3CC USE AS DIRECTED ONCE A MONTH FOR B12 INJECTIONS      Allergies  Allergen Reactions  . Atorvastatin Other  (See Comments)    Raises liver enzymes (takes pravastatin at home)  . Influenza Vaccines Other (See Comments)    Unknown  . Phenytoin Other (See Comments)    Blood clots  . Promethazine Hcl Other (See Comments)    Severe sedation  . Sulfamethoxazole     REACTION: unspecified  Per pt daughter she stated this causes rash   Discharge Instructions    Diet - low sodium heart healthy   Complete by:  As directed    Increase activity slowly   Complete by:  As directed      Discharge Exam: Filed Weights   06/12/17 1901 06/13/17 0027  Weight: 49.4 kg (109 lb) 49.4 kg (109 lb)   Vitals:   06/13/17 2206 06/14/17 0530  BP: (!) 141/69 (!) 150/84  Pulse: 71 85  Resp: 18 18  Temp: 98.2 F (36.8 C) 98.1 F (36.7 C)  SpO2: 94% 92%   General: Appear in no distress, no Rash; Oral Mucosa moist. Cardiovascular: S1 and S2 Present, no Murmur, no JVD Respiratory: Bilateral Air entry present and Clear to Auscultation, no Crackles, no wheezes Abdomen: Bowel Sound present, Soft and no tenderness Extremities: no Pedal edema, no calf tenderness Neurology: Grossly no focal neuro deficit.  The results of significant diagnostics from this hospitalization (including imaging, microbiology, ancillary and laboratory) are listed below for reference.    Significant Diagnostic Studies: Dg Chest 2 View  Result Date: 06/12/2017 CLINICAL DATA:  Fall. EXAM: CHEST - 2 VIEW COMPARISON:  08/13/2013. FINDINGS: Lungs are hyperexpanded. The lungs are clear without focal pneumonia, edema, pneumothorax or pleural effusion. Interstitial markings are diffusely coarsened with chronic features. 12 mm nodule identified right upper lung, not seen on prior imaging. Bones are diffusely demineralized. Telemetry leads overlie the chest. IMPRESSION: 12 mm right upper lobe pulmonary nodule. CT chest without contrast recommended to further evaluate. Emphysema. Electronically Signed   By: Misty Stanley M.D.   On: 06/12/2017 20:33    Dg Lumbar Spine Complete  Result Date: 06/12/2017 CLINICAL DATA:  Fall with back pain EXAM: LUMBAR SPINE - COMPLETE 4+ VIEW COMPARISON:  None. FINDINGS: The bones are osteopenic. There is mild right convex asymmetry of the lumbar spine. There is no acute fracture or listhesis. Vertebral body heights are maintained. Mild degenerative disc disease, greatest at L4-L5. There is extensive atherosclerotic calcification of the aorta. Normal appearance of the sacrum. IMPRESSION: No acute fracture or static subluxation of the lumbar spine. Electronically Signed   By: Ulyses Jarred M.D.   On: 06/12/2017 21:39   Dg Pelvis 1-2 Views  Result Date: 06/12/2017 CLINICAL DATA:  Fall. EXAM: PELVIS - 1-2 VIEW COMPARISON:  09/27/2016 FINDINGS: Bones are diffusely demineralized. SI  joints and symphysis pubis unremarkable. Patient is status post right total hip replacement with femoral component incompletely visualized. Degenerative changes are noted in left hip, as before. IMPRESSION: Negative. Electronically Signed   By: Misty Stanley M.D.   On: 06/12/2017 20:31   Ct Head Wo Contrast  Result Date: 06/12/2017 CLINICAL DATA:  Altered level of consciousness following blunt trauma to the face and head. EXAM: CT HEAD WITHOUT CONTRAST CT MAXILLOFACIAL WITHOUT CONTRAST TECHNIQUE: Multidetector CT imaging of the head and maxillofacial structures were performed using the standard protocol without intravenous contrast. Multiplanar CT image reconstructions of the maxillofacial structures were also generated. COMPARISON:  08/02/2016 FINDINGS: CT HEAD FINDINGS Brain: There is focal encephalomalacia involving the right posterior parietal and occipital lobes. This lies underneath a prior craniotomy flap. There is an area bone resorption of the posterior calvarium involving a portion of the craniotomy flap. Within this area of resorption is a soft tissue mass that bulges into the subgaleal soft tissues and into the extra-axial space  adjacent to and involving the posterior, superior sagittal sinus. This mass measures 2.8 x 2.7 cm transversely. On the prior exam, the mass is slightly smaller, measuring 2.6 x 2.5 cm. The amount of bone resorption as slightly increased. There are no parenchymal masses or mass effect. There is no evidence of an acute infarct. There is no intracranial hemorrhage. No other extra-axial abnormality. Ventricles are normal in size and configuration. Patchy white matter hypoattenuation is noted consistent with moderate chronic microvascular ischemic change. Vascular: No hyperdense vessel or unexpected calcification. Skull: No skull fracture.  No other skull lesion. Other: None. CT MAXILLOFACIAL FINDINGS Osseous: No fracture.  No bone lesion. Orbits: Negative. No traumatic or inflammatory finding. Sinuses: Clear sinuses, mastoid air cells and middle ear cavities. Soft tissues: No convincing soft tissue contusion or hematoma. No mass or adenopathy. IMPRESSION: HEAD CT 1. No acute intracranial abnormalities. 2. Status post posterior right occipital/parietal craniotomy reportedly for a meningioma resection. There is bone resorption along the craniotomy flap within which lies an extra-axial mass spanning the defect in the craniotomy flap. This is consistent with residual/recurrent meningioma, which has mildly increased in size from the prior exam. MAXILLOFACIAL CT 1. No fracture or acute finding. Electronically Signed   By: Lajean Manes M.D.   On: 06/12/2017 20:47   Mr Brain Wo Contrast  Result Date: 06/14/2017 CLINICAL DATA:  Fatigue. EXAM: MRI HEAD WITHOUT CONTRAST TECHNIQUE: Multiplanar, multiecho pulse sequences of the brain and surrounding structures were obtained without intravenous contrast. COMPARISON:  CT 06/12/2017.  MRI 10/21/2014. FINDINGS: Brain: Diffusion imaging does not show any acute or subacute infarction. Mild chronic small-vessel ischemic changes affect the pons. There are a few old small vessel  cerebellar infarctions. Cerebral hemispheres show moderate to marked changes of chronic small vessel disease throughout the deep and subcortical white matter. Small right parietal cortical infarction at the vertex. Right parieto-occipital cortical and subcortical encephalomalacia with surrounding gliosis related to previous tumor resection in that region. There has been previous right occipital craniotomy for meningioma resection. There is residual tumor at the medial margin with intracranial component measuring about 1.8 cm in size, clearly invading and occluding the sagittal sinus, tumor involving the calvarium at the craniotomy a edge and within the adjacent and tumor bulging into the soft tissues of the scalp. That component measures about 3 cm in diameter. Vascular: Major vessels at the base of the brain show flow. Skull and upper cervical spine: Otherwise negative Sinuses/Orbits: Mild mucosal inflammatory changes of  the paranasal sinuses. Orbits negative. Other: None IMPRESSION: No evidence of acute or subacute infarction. Extensive chronic small-vessel ischemic changes throughout the brain. Postoperative encephalomalacia in the right parieto-occipital brain with adjacent gliosis. Growing residual/recurrent meningioma tissue measuring in total about 3 cm in diameter, with a component along the medial intracranial margin, invading and occluding the sagittal sinus, with intraosseous extension, and with bulging into the soft tissues of the scalp. Electronically Signed   By: Nelson Chimes M.D.   On: 06/14/2017 11:55   Ct Maxillofacial Wo Cm  Result Date: 06/12/2017 CLINICAL DATA:  Altered level of consciousness following blunt trauma to the face and head. EXAM: CT HEAD WITHOUT CONTRAST CT MAXILLOFACIAL WITHOUT CONTRAST TECHNIQUE: Multidetector CT imaging of the head and maxillofacial structures were performed using the standard protocol without intravenous contrast. Multiplanar CT image reconstructions of the  maxillofacial structures were also generated. COMPARISON:  08/02/2016 FINDINGS: CT HEAD FINDINGS Brain: There is focal encephalomalacia involving the right posterior parietal and occipital lobes. This lies underneath a prior craniotomy flap. There is an area bone resorption of the posterior calvarium involving a portion of the craniotomy flap. Within this area of resorption is a soft tissue mass that bulges into the subgaleal soft tissues and into the extra-axial space adjacent to and involving the posterior, superior sagittal sinus. This mass measures 2.8 x 2.7 cm transversely. On the prior exam, the mass is slightly smaller, measuring 2.6 x 2.5 cm. The amount of bone resorption as slightly increased. There are no parenchymal masses or mass effect. There is no evidence of an acute infarct. There is no intracranial hemorrhage. No other extra-axial abnormality. Ventricles are normal in size and configuration. Patchy white matter hypoattenuation is noted consistent with moderate chronic microvascular ischemic change. Vascular: No hyperdense vessel or unexpected calcification. Skull: No skull fracture.  No other skull lesion. Other: None. CT MAXILLOFACIAL FINDINGS Osseous: No fracture.  No bone lesion. Orbits: Negative. No traumatic or inflammatory finding. Sinuses: Clear sinuses, mastoid air cells and middle ear cavities. Soft tissues: No convincing soft tissue contusion or hematoma. No mass or adenopathy. IMPRESSION: HEAD CT 1. No acute intracranial abnormalities. 2. Status post posterior right occipital/parietal craniotomy reportedly for a meningioma resection. There is bone resorption along the craniotomy flap within which lies an extra-axial mass spanning the defect in the craniotomy flap. This is consistent with residual/recurrent meningioma, which has mildly increased in size from the prior exam. MAXILLOFACIAL CT 1. No fracture or acute finding. Electronically Signed   By: Lajean Manes M.D.   On: 06/12/2017  20:47    Microbiology: Recent Results (from the past 240 hour(s))  Culture, Urine     Status: Abnormal   Collection Time: 06/12/17  9:55 PM  Result Value Ref Range Status   Specimen Description URINE, RANDOM  Final   Special Requests NONE  Final   Culture (A)  Final    <10,000 COLONIES/mL INSIGNIFICANT GROWTH Performed at [facility], [address] [place], [place] [address]    Report Status 06/14/2017 FINAL  Final     Labs: CBC: Recent Labs  Lab 06/12/17 1954 06/14/17 0357  WBC 11.1* 9.2  NEUTROABS 9.6* 6.6  HGB 12.3 10.7*  HCT 37.8 33.5*  MCV 92.2 91.8  PLT 190 017   Basic Metabolic Panel: Recent Labs  Lab 06/12/17 1954 06/13/17 0224 06/14/17 0357  NA 136  --  139  K 4.1  --  3.6  CL 100*  --  104  CO2 27  --  27  GLUCOSE 173*  --  104*  BUN 15  --  10  CREATININE 0.95  --  0.84  CALCIUM 9.0  --  8.5*  MG  --  1.5* 1.8   Liver Function Tests: Recent Labs  Lab 06/12/17 1954 06/14/17 0357  AST 26 18  ALT 15 12*  ALKPHOS 89 74  BILITOT 0.8 0.7  PROT 6.3* 5.9*  ALBUMIN 3.7 3.1*   Recent Labs  Lab 06/12/17 1954  LIPASE 35   No results for input(s): AMMONIA in the last 168 hours. Cardiac Enzymes: Recent Labs  Lab 06/12/17 1954 06/13/17 0224 06/13/17 0733  TROPONINI <0.03 <0.03 <0.03   BNP (last 3 results) No results for input(s): BNP in the last 8760 hours. CBG: No results for input(s): GLUCAP in the last 168 hours. Time spent: 35 minutes  Signed:  Berle Mull  [facility] 06/14/2017 , 8:13 AM

## 2017-06-17 ENCOUNTER — Telehealth: Payer: Self-pay | Admitting: Family Medicine

## 2017-06-17 ENCOUNTER — Telehealth: Payer: Self-pay | Admitting: Internal Medicine

## 2017-06-17 DIAGNOSIS — I69354 Hemiplegia and hemiparesis following cerebral infarction affecting left non-dominant side: Secondary | ICD-10-CM | POA: Diagnosis not present

## 2017-06-17 DIAGNOSIS — Z87440 Personal history of urinary (tract) infections: Secondary | ICD-10-CM | POA: Diagnosis not present

## 2017-06-17 DIAGNOSIS — I10 Essential (primary) hypertension: Secondary | ICD-10-CM | POA: Diagnosis not present

## 2017-06-17 DIAGNOSIS — E039 Hypothyroidism, unspecified: Secondary | ICD-10-CM | POA: Diagnosis not present

## 2017-06-17 DIAGNOSIS — M1990 Unspecified osteoarthritis, unspecified site: Secondary | ICD-10-CM | POA: Diagnosis not present

## 2017-06-17 DIAGNOSIS — Z9181 History of falling: Secondary | ICD-10-CM | POA: Diagnosis not present

## 2017-06-17 DIAGNOSIS — I2510 Atherosclerotic heart disease of native coronary artery without angina pectoris: Secondary | ICD-10-CM | POA: Diagnosis not present

## 2017-06-17 DIAGNOSIS — K589 Irritable bowel syndrome without diarrhea: Secondary | ICD-10-CM | POA: Diagnosis not present

## 2017-06-17 DIAGNOSIS — D510 Vitamin B12 deficiency anemia due to intrinsic factor deficiency: Secondary | ICD-10-CM | POA: Diagnosis not present

## 2017-06-17 DIAGNOSIS — Z85841 Personal history of malignant neoplasm of brain: Secondary | ICD-10-CM | POA: Diagnosis not present

## 2017-06-17 NOTE — Telephone Encounter (Signed)
Copied from [place] [id number]. Topic: General - Other >> Jun 17, 2017  4:02 PM Margot Ables wrote: Reason for CRM: requesting VO for [facility] PT 1-2 x week for 4-5 weeks. She has secure VM and ok to leave detailed msg.

## 2017-06-17 NOTE — Telephone Encounter (Signed)
Okay for verbal orders. 

## 2017-06-17 NOTE — Telephone Encounter (Signed)
Noted  

## 2017-06-17 NOTE — Telephone Encounter (Signed)
Copied from Home [id number]. Topic: General - Other >> Jun 17, 2017 11:13 AM Yvette Rack wrote: Reason for CRM: Donita with [facility] called for orders. Donita stated the orders would need to be signed. Donita will fax orders and ask that Dr. Burnice Logan follow the pt.

## 2017-06-18 ENCOUNTER — Encounter: Payer: Self-pay | Admitting: Internal Medicine

## 2017-06-18 ENCOUNTER — Ambulatory Visit (INDEPENDENT_AMBULATORY_CARE_PROVIDER_SITE_OTHER): Payer: Medicare Other | Admitting: Internal Medicine

## 2017-06-18 VITALS — BP 160/80 | HR 58 | Temp 98.1°F

## 2017-06-18 DIAGNOSIS — E0590 Thyrotoxicosis, unspecified without thyrotoxic crisis or storm: Secondary | ICD-10-CM | POA: Diagnosis not present

## 2017-06-18 DIAGNOSIS — I10 Essential (primary) hypertension: Secondary | ICD-10-CM | POA: Diagnosis not present

## 2017-06-18 DIAGNOSIS — D320 Benign neoplasm of cerebral meninges: Secondary | ICD-10-CM

## 2017-06-18 DIAGNOSIS — W19XXXD Unspecified fall, subsequent encounter: Secondary | ICD-10-CM | POA: Diagnosis not present

## 2017-06-18 DIAGNOSIS — Z8719 Personal history of other diseases of the digestive system: Secondary | ICD-10-CM | POA: Diagnosis not present

## 2017-06-18 NOTE — Patient Instructions (Signed)
Return as scheduled for follow-up in September  Physical therapy as scheduled

## 2017-06-18 NOTE — Progress Notes (Signed)
Subjective:    Patient ID: [name], female    DOB: 1923-01-21, 84 y.o.   MRN: [id number]  HPI  Date of admission: 06/12/2017             Date of discharge: 06/14/2017    Discharge Diagnoses:  Active Problems:   Essential hypertension   Osteoarthritis   MENINGIOMA   Bradycardia   Hyperthyroidism   Fall   Protein-calorie malnutrition, severe   Admitted From: home Disposition:  Home with  Home health  84 year old patient who is seen today following a recent hospital discharge 4 days ago. She was admitted after a fall with dehydration malnutrition and weakness. Evaluation included a brain MRI that revealed slight increase in the size of a meningioma. Since her discharge she has done fairly well.  Presently she is staying with a daughter and has begun a physical therapy since her discharge.  Her appetite has improved with encouragement from her daughter.  She walks with a cane.  Hospital records reviewed Discharge medications reviewed.  And there has been discontinued and now is on Toprol 25 mg daily;  she has a history of essential hypertension   Past Medical History:  Diagnosis Date  . Abnormal CT of the chest    Stable patchy/nodular and ground-glass opacities in the right upper lobe, While grossly unchanged, low grade adenocarcinoma remains possible.  Marland Kitchen ANEMIA, PERNICIOUS 09/10/2006  . Bradycardia    August, 2013  . CORONARY ARTERY DISEASE 11/07/2008   a. NSTEMI 2001 & STEMI 2008 managed medically. b. Takotsubo 06/2006. c. Cath 2009 -> med rx.  . CVA WITH LEFT HEMIPARESIS 07/22/2007   May, 2009, neurology decided aspirin and Plavix at that time.  . DERMATITIS 03/02/2007  . DJD (degenerative joint disease)    L shoulder impingement  . Ejection fraction    EF 60%, echo, July, 2010, (  EF improved after tach a suitable event 2008)  . Family history of anesthesia complication    daughter has a hard time waking up  . H/O hiatal hernia   . HYPERTENSION 07/15/2006  .  IBS (irritable bowel syndrome)   . MENINGIOMA 08/27/2006   Occipital meningioma, surgery, [facility], 2008 /  MRI, [facility], June, 2011, no change in lesion, history believe the patient had a gamma knife treatment of a right occipital atypical meningioma  . OSTEOARTHRITIS 07/15/2006  . PULMONARY NODULE 01/14/2007   [facility], December, 2011  . Stroke ([place])   . Subarachnoid hemorrhage following injury ([place]) 2007   Subarachnoid hemorrhage secondary to a fall December, 2007  . Takotsubo syndrome 04/22/2008   MI, May, 2008, question takotsubo event  . THYROID NODULE 01/14/2007     Social History   Socioeconomic History  . Marital status: Widowed    Spouse name: Not on file  . Number of children: 4  . Years of education: Not on file  . Highest education level: Not on file  Occupational History    Employer: RETIRED  Social Needs  . Financial resource strain: Not on file  . Food insecurity:    Worry: Not on file    Inability: Not on file  . Transportation needs:    Medical: Not on file    Non-medical: Not on file  Tobacco Use  . Smoking status: Never Smoker  . Smokeless tobacco: Never Used  Substance and Sexual Activity  . Alcohol use: No    Alcohol/week: 0.0 oz  . Drug use: No  . Sexual activity: Never  Birth control/protection: Surgical  Lifestyle  . Physical activity:    Days per week: Not on file    Minutes per session: Not on file  . Stress: Not on file  Relationships  . Social connections:    Talks on phone: Not on file    Gets together: Not on file    Attends religious service: Not on file    Active member of club or organization: Not on file    Attends meetings of clubs or organizations: Not on file    Relationship status: Not on file  . Intimate partner violence:    Fear of current or ex partner: Not on file    Emotionally abused: Not on file    Physically abused: Not on file    Forced sexual activity: Not on file  Other Topics Concern  . Not on file  Social  History Narrative   Widowed. Lives in [place], [place] with daughter.     Past Surgical History:  Procedure Laterality Date  . ABDOMINAL HYSTERECTOMY    . BRAIN TUMOR EXCISION     Meningioma; treated at [facility] in 2008  . CHOLECYSTECTOMY    . JOINT REPLACEMENT     rt THR  . SHOULDER SURGERY      Family History  Problem Relation Age of Onset  . Heart attack Brother 84    Allergies  Allergen Reactions  . Atorvastatin Other (See Comments)    Raises liver enzymes (takes pravastatin at home)  . Influenza Vaccines Other (See Comments)    Unknown  . Phenytoin Other (See Comments)    Blood clots  . Promethazine Hcl Other (See Comments)    Severe sedation  . Sulfamethoxazole     REACTION: unspecified  Per pt daughter she stated this causes rash    Current Outpatient Medications on File Prior to Visit  Medication Sig Dispense Refill  . acetaminophen (TYLENOL) 325 MG tablet Take 325 mg by mouth every 6 (six) hours as needed for mild pain or moderate pain.    . cyanocobalamin (,VITAMIN B-12,) 1000 MCG/ML injection INJECT 1 ML (1000 MCG TOTAL) INTRAMUSCULARLY EVERY 30 DAYS 3 mL 3  . cycloSPORINE (RESTASIS) 0.05 % ophthalmic emulsion 1 drop 2 (two) times daily.    . feeding supplement, ENSURE ENLIVE, (ENSURE ENLIVE) LIQD Take 237 mLs by mouth 2 (two) times daily between meals. 237 mL 12  . methimazole (TAPAZOLE) 5 MG tablet TAKE 1 TABLET THREE TIMES A WEEK, MONDAY, WEDNESDAY, AND FRIDAY (Patient taking differently: TAKE 1 TABLET (5 MG TOTALLY) THREE TIMES A WEEK, MON, WED, AND FRI) 45 tablet 1  . metoprolol succinate (TOPROL-XL) 25 MG 24 hr tablet Take 0.5 tablets (12.5 mg total) by mouth daily. 30 tablet 0  . NEEDLE, DISP, 25 G (BD DISP NEEDLES) 25G X 5/8" MISC USE AS DIRECTED ONCE A MONTH FOR B12 INJECTIONS 3 each 4  . nitroGLYCERIN (NITROSTAT) 0.4 MG SL tablet Place 1 tablet (0.4 mg total) under the tongue every 5 (five) minutes as needed. For chest pain. 15 tablet 3  . OVER THE  COUNTER MEDICATION Take 1 Package by mouth See admin instructions. Take 1 pack of Spark advocare by mouth everyday for energy    . polyethylene glycol (MIRALAX / GLYCOLAX) packet Take 17 g by mouth daily as needed for mild constipation. 14 each 0  . pravastatin (PRAVACHOL) 20 MG tablet TAKE 1 TABLET DAILY IN THE EVENING (Patient taking differently: TAKE 1 TABLET (20 MG TOTALLY) DAILY IN THE EVENING)  90 tablet 1  . PREMARIN vaginal cream USE 0.5 GRAM VAGINALLY TWICE A WEEK AS NEEDED 90 g 1  . risperiDONE (RISPERDAL) 0.25 MG tablet Take 1 tablet (0.25 mg total) by mouth 2 (two) times daily as needed (agitation). 30 tablet 0  . Syringe, Disposable, (B-D SYRINGE LUER-LOK 3CC) 3 ML MISC USE AS DIRECTED ONCE A MONTH FOR B12 INJECTIONS 3 each 5  . fluticasone (FLONASE) 50 MCG/ACT nasal spray Place 2 sprays into both nostrils daily. (Patient not taking: Reported on 06/18/2017) 16 g 6   No current facility-administered medications on file prior to visit.     BP (!) 160/80 (BP Location: Right Arm, Patient Position: Sitting, Cuff Size: Large)   Pulse (!) 58   Temp 98.1 F (36.7 C) (Oral)   SpO2 97%      Review of Systems  Constitutional: Positive for activity change, appetite change and fatigue.  HENT: Negative for congestion, dental problem, hearing loss, rhinorrhea, sinus pressure, sore throat and tinnitus.   Eyes: Negative for pain, discharge and visual disturbance.  Respiratory: Negative for cough and shortness of breath.   Cardiovascular: Negative for chest pain, palpitations and leg swelling.  Gastrointestinal: Negative for abdominal distention, abdominal pain, blood in stool, constipation, diarrhea, nausea and vomiting.  Genitourinary: Negative for difficulty urinating, dysuria, flank pain, frequency, hematuria, pelvic pain, urgency, vaginal bleeding, vaginal discharge and vaginal pain.  Musculoskeletal: Positive for back pain and gait problem. Negative for arthralgias and joint swelling.    Skin: Negative for rash.  Neurological: Positive for weakness. Negative for dizziness, syncope, speech difficulty, numbness and headaches.  Hematological: Negative for adenopathy.  Psychiatric/Behavioral: Negative for agitation, behavioral problems and dysphoric mood. The patient is not nervous/anxious.        Objective:   Physical Exam  Constitutional: She is oriented to person, place, and time. She appears well-developed and well-nourished.  Repeat blood pressure 130/72  HENT:  Head: Normocephalic.  Right Ear: External ear normal.  Left Ear: External ear normal.  Mouth/Throat: Oropharynx is clear and moist.  Eyes: Pupils are equal, round, and reactive to light. Conjunctivae and EOM are normal.  Neck: Normal range of motion. Neck supple. No thyromegaly present.  Cardiovascular: Normal rate, normal heart sounds and intact distal pulses.  Frequent ectopics  Pulmonary/Chest: Effort normal and breath sounds normal.  Abdominal: Soft. Bowel sounds are normal. She exhibits no mass. There is no tenderness.  Musculoskeletal: Normal range of motion.  Trace left ankle edema  Lymphadenopathy:    She has no cervical adenopathy.  Neurological: She is alert and oriented to person, place, and time.  Skin: Skin is warm and dry. No rash noted.  Psychiatric: She has a normal mood and affect. Her behavior is normal.          Assessment & Plan:  Status post fall History dehydration/deconditioning.  Will continue home physical therapy History of hypertension stable Hyperthyroidism.  Presently controlled on Tapazole  No change in medical therapy Continue physical therapy Follow-up in September as scheduled   [name]

## 2017-06-18 NOTE — Telephone Encounter (Signed)
Okay 

## 2017-06-18 NOTE — Telephone Encounter (Signed)
left message given okay orders

## 2017-06-19 NOTE — Telephone Encounter (Signed)
Okay for verbal 

## 2017-06-19 NOTE — Telephone Encounter (Signed)
[name] is calling back and states she did the patients start of care after hospital discharge and she needs verbal orders that is okay to proceed with nursing.   [phone]

## 2017-06-20 NOTE — Telephone Encounter (Signed)
Donita given verbal orders 

## 2017-06-20 NOTE — Telephone Encounter (Signed)
Okay 

## 2017-06-23 ENCOUNTER — Telehealth: Payer: Self-pay | Admitting: Internal Medicine

## 2017-06-23 NOTE — Telephone Encounter (Signed)
Copied from [place] [id number]. Topic: General - Other >> Jun 23, 2017 11:37 AM Valla Leaver wrote: Reason for CRM: Beth, PT assist with [facility] to report that at her home visit for physical therapy, the patient appeared somewhat lethargic The patients daughter confirmed that the patient has not been very active at all and has lost her appetite. The patient exhibited shallow breathing.  Vitals: Heart Rate: low 40's  O2 Sat 90-91% (increase w cativity but goes back to baseline)  Blood Pressure 130/70

## 2017-06-23 NOTE — Telephone Encounter (Signed)
Patient's daughter called and situation discussed.  She is better today compared to Sunday.  Metoprolol will be discontinued due to bradycardia.  Will call back for office visit tomorrow if unimproved

## 2017-06-23 NOTE — Telephone Encounter (Signed)
Dr. Raliegh Ip - Please review note and advise, if needed. Thanks!

## 2017-06-23 NOTE — Telephone Encounter (Signed)
See note

## 2017-06-25 ENCOUNTER — Telehealth: Payer: Self-pay | Admitting: Family Medicine

## 2017-06-25 NOTE — Telephone Encounter (Signed)
Copied from [place] [phone]. Topic: Inquiry >> Jun 25, 2017 11:16 AM Pricilla Handler wrote: Reason for CRM: Sharee Pimple with [facility] [phone]) called requesting permission to give this patient a medication (Mucinex). Sharee Pimple states that patient's throat is clear, but she has a "rattle" in her throat. Patient's appetitie in not great at this time also. Please call Sharee Pimple at [phone] asap.       Thank You!!!

## 2017-06-25 NOTE — Telephone Encounter (Signed)
Yes okay to administer Mucinex

## 2017-06-25 NOTE — Telephone Encounter (Signed)
Okay to administer?  Please advise

## 2017-06-27 ENCOUNTER — Encounter: Payer: Self-pay | Admitting: Internal Medicine

## 2017-06-27 NOTE — Telephone Encounter (Signed)
Spoke to Regina and asked if the pt still had the rattling in her throat. She stated she hasn't seen her in 2 days but she thinks it's still there. I gave her the okay orders for mucinex if it is still there today.

## 2017-07-01 ENCOUNTER — Other Ambulatory Visit: Payer: Self-pay | Admitting: Internal Medicine

## 2017-07-01 MED ORDER — AZITHROMYCIN 250 MG PO TABS: ORAL_TABLET | ORAL | 0 refills | Status: DC

## 2017-07-03 ENCOUNTER — Telehealth: Payer: Self-pay | Admitting: Family Medicine

## 2017-07-03 NOTE — Telephone Encounter (Signed)
Okay for verbal orders. 

## 2017-07-03 NOTE — Telephone Encounter (Signed)
Copied from [place] [id number]. Topic: Inquiry >> Jul 03, 2017  9:59 AM Oneta Rack wrote: Osvaldo Human name: Claiborne Billings  Relation to pt: PT from [facility]  Call back number: [phone]    Reason for call:  Verbal orders for to continue home PT 2x 4, Speech Eval orders for swallowing and cognition, please advise

## 2017-07-04 NOTE — Telephone Encounter (Signed)
ok 

## 2017-07-04 NOTE — Telephone Encounter (Signed)
Left message for pt to return phone call

## 2017-07-04 NOTE — Telephone Encounter (Signed)
Verbal orders given to Kelly 

## 2017-07-06 ENCOUNTER — Other Ambulatory Visit: Payer: Self-pay | Admitting: Internal Medicine

## 2017-07-08 ENCOUNTER — Telehealth: Payer: Self-pay | Admitting: Internal Medicine

## 2017-07-08 DIAGNOSIS — R1310 Dysphagia, unspecified: Secondary | ICD-10-CM

## 2017-07-08 NOTE — Telephone Encounter (Signed)
Okay for verbal orders. 

## 2017-07-08 NOTE — Telephone Encounter (Signed)
Copied from [place]. Topic: Quick Communication - See Telephone Encounter >> Jul 08, 2017  2:03 PM Vernona Rieger wrote: CRM for notification. See Telephone encounter for: 07/08/17.  Sonia Baller, [facility] therapist from [facility] needs orders to see her for swallowing issues 2 week 1 & 1 week 2. She wants to send her for a modified barring swallowing test. The order needs to be sent to [facility]. It can not be done with home health. Call back is [phone]

## 2017-07-10 NOTE — Telephone Encounter (Signed)
Verbal orders called as directed and MBS order placed for [facility].

## 2017-07-11 ENCOUNTER — Other Ambulatory Visit (HOSPITAL_COMMUNITY): Payer: Self-pay | Admitting: Internal Medicine

## 2017-07-11 DIAGNOSIS — R1310 Dysphagia, unspecified: Secondary | ICD-10-CM

## 2017-07-17 DIAGNOSIS — D420 Neoplasm of uncertain behavior of cerebral meninges: Secondary | ICD-10-CM | POA: Diagnosis not present

## 2017-07-21 ENCOUNTER — Ambulatory Visit (HOSPITAL_COMMUNITY)
Admission: RE | Admit: 2017-07-21 | Discharge: 2017-07-21 | Disposition: A | Discharge status: Home / Self Care | Payer: Medicare Other | Source: Ambulatory Visit | Attending: Internal Medicine | Admitting: Internal Medicine

## 2017-07-21 DIAGNOSIS — R05 Cough: Secondary | ICD-10-CM | POA: Diagnosis not present

## 2017-07-21 DIAGNOSIS — R1310 Dysphagia, unspecified: Secondary | ICD-10-CM | POA: Diagnosis not present

## 2017-07-21 NOTE — Progress Notes (Signed)
Objective Swallowing Evaluation: Type of Study: MBS-Modified Barium Swallow Study   Patient Details  Name: [name] MRN: [id number] Date of Birth: 1922/02/27  Today's Date: 07/21/2017 Time: SLP Start Time (ACUTE ONLY): 1120 -SLP Stop Time (ACUTE ONLY): 1141  SLP Time Calculation (min) (ACUTE ONLY): 21 min   Past Medical History:  Past Medical History:  Diagnosis Date  . Abnormal CT of the chest    Stable patchy/nodular and ground-glass opacities in the right upper lobe, While grossly unchanged, low grade adenocarcinoma remains possible.  Marland Kitchen ANEMIA, PERNICIOUS 09/10/2006  . Bradycardia    August, 2013  . CORONARY ARTERY DISEASE 11/07/2008   a. NSTEMI 2001 & STEMI 2008 managed medically. b. Takotsubo 06/2006. c. Cath 2009 -> med rx.  . CVA WITH LEFT HEMIPARESIS 07/22/2007   May, 2009, neurology decided aspirin and Plavix at that time.  . DERMATITIS 03/02/2007  . DJD (degenerative joint disease)    L shoulder impingement  . Ejection fraction    EF 60%, echo, July, 2010, (  EF improved after tach a suitable event 2008)  . Family history of anesthesia complication    daughter has a hard time waking up  . H/O hiatal hernia   . HYPERTENSION 07/15/2006  . IBS (irritable bowel syndrome)   . MENINGIOMA 08/27/2006   Occipital meningioma, surgery, [facility], 2008 /  MRI, [place], June, 2011, no change in lesion, history believe the patient had a gamma knife treatment of a right occipital atypical meningioma  . OSTEOARTHRITIS 07/15/2006  . PULMONARY NODULE 01/14/2007   [facility], December, 2011  . Stroke ([place])   . Subarachnoid hemorrhage following injury ([place]) 2007   Subarachnoid hemorrhage secondary to a fall December, 2007  . Takotsubo syndrome 04/22/2008   MI, May, 2008, question takotsubo event  . THYROID NODULE 01/14/2007   Past Surgical History:  Past Surgical History:  Procedure Laterality Date  . ABDOMINAL HYSTERECTOMY    . BRAIN TUMOR EXCISION     Meningioma; treated at [facility] in  2008  . CHOLECYSTECTOMY    . JOINT REPLACEMENT     rt THR  . SHOULDER SURGERY     HPI: Pt is a 84 yr old brought for outpatient MBS by daughter. History includes hiatal hernia, stroke, MI, SAH and tyroid nodule. Pt and daughter report coughing mostly after meals and globus sensaiton. Denies pneumonia.     No data recorded   Assessment / Plan / Recommendation  CHL IP CLINICAL IMPRESSIONS 07/21/2017  Clinical Impression Pt demonstrated mild oral and pharyngeal dysphagia with penetration and aspiration of thin liquid via cup. Decreased oral cohesion led to liquid falling sublingually and piecemeal transit pattern. Penetration occured before the swallow with trace aspiration during without sensation although cued throat clear successfully removed penetrates. Likely combination of decreased timing and incomplete laryngeal closure as etiology for dysphagia. No significant pharyngeal residue. Chin tuck posture consistently protected and prevented airway intrusion especially with small sips. She orally transited pill in puree barium without difficulty with pill noted to stop mid-lower esophagus and not descend esophagus despite trials of puree and thin. Recommend continue regular texture, thin liquids with chin tuck (liquid), pills whole in applesauce, volitional throat clear during meals and small sips.   SLP Visit Diagnosis Dysphagia, oropharyngeal phase (R13.12)  Attention and concentration deficit following --  Frontal lobe and executive function deficit following --  Impact on safety and function Moderate aspiration risk      CHL IP TREATMENT RECOMMENDATION 07/21/2017  Treatment Recommendations No treatment  recommended at this time     No flowsheet data found.  CHL IP DIET RECOMMENDATION 07/21/2017  SLP Diet Recommendations Regular solids;Thin liquid  Liquid Administration via Cup  Medication Administration Whole meds with puree  Compensations Minimize environmental distractions;Slow  rate;Small sips/bites;Chin tuck;Follow solids with liquid  Postural Changes Seated upright at 90 degrees;Remain semi-upright after after feeds/meals (Comment)      CHL IP OTHER RECOMMENDATIONS 07/21/2017  Recommended Consults --  Oral Care Recommendations Oral care BID  Other Recommendations --      CHL IP FOLLOW UP RECOMMENDATIONS 07/21/2017  Follow up Recommendations None      No flowsheet data found.         CHL IP ORAL PHASE 07/21/2017  Oral Phase Impaired  Oral - Pudding Teaspoon --  Oral - Pudding Cup --  Oral - Honey Teaspoon --  Oral - Honey Cup --  Oral - Nectar Teaspoon --  Oral - Nectar Cup --  Oral - Nectar Straw --  Oral - Thin Teaspoon --  Oral - Thin Cup Decreased bolus cohesion;Premature spillage;Piecemeal swallowing  Oral - Thin Straw --  Oral - Puree --  Oral - Mech Soft --  Oral - Regular WFL  Oral - Multi-Consistency --  Oral - Pill WFL  Oral Phase - Comment --    CHL IP PHARYNGEAL PHASE 07/21/2017  Pharyngeal Phase Impaired  Pharyngeal- Pudding Teaspoon --  Pharyngeal --  Pharyngeal- Pudding Cup --  Pharyngeal --  Pharyngeal- Honey Teaspoon --  Pharyngeal --  Pharyngeal- Honey Cup --  Pharyngeal --  Pharyngeal- Nectar Teaspoon --  Pharyngeal --  Pharyngeal- Nectar Cup --  Pharyngeal --  Pharyngeal- Nectar Straw --  Pharyngeal --  Pharyngeal- Thin Teaspoon --  Pharyngeal --  Pharyngeal- Thin Cup Penetration/Aspiration before swallow;Penetration/Aspiration during swallow;Reduced airway/laryngeal closure  Pharyngeal Material enters airway, passes BELOW cords without attempt by patient to eject out (silent aspiration)  Pharyngeal- Thin Straw --  Pharyngeal --  Pharyngeal- Puree --  Pharyngeal --  Pharyngeal- Mechanical Soft --  Pharyngeal --  Pharyngeal- Regular WFL  Pharyngeal --  Pharyngeal- Multi-consistency --  Pharyngeal --  Pharyngeal- Pill WFL  Pharyngeal --  Pharyngeal Comment --     CHL IP CERVICAL ESOPHAGEAL PHASE  07/21/2017  Cervical Esophageal Phase WFL  Pudding Teaspoon --  Pudding Cup --  Honey Teaspoon --  Honey Cup --  Nectar Teaspoon --  Nectar Cup --  Nectar Straw --  Thin Teaspoon --  Thin Cup --  Thin Straw --  Puree --  Mechanical Soft --  Regular --  Multi-consistency --  Pill --  Cervical Esophageal Comment --    CHL IP GO 11/18/2011  Functional Assessment Tool Used skilled clinical observation  Functional Limitations Swallowing  Swallow Current Status (Y0998) CI  Swallow Goal Status (P3825) CI  Swallow Discharge Status (K5397) CI  Motor Speech Current Status (Q7341) (None)  Motor Speech Goal Status (P3790) (None)  Motor Speech Goal Status (W4097) (None)  Spoken Language Comprehension Current Status (D5329) (None)  Spoken Language Comprehension Goal Status (J2426) (None)  Spoken Language Comprehension Discharge Status (S3419) (None)  Spoken Language Expression Current Status (Q2229) (None)  Spoken Language Expression Goal Status (N9892) (None)  Spoken Language Expression Discharge Status [phone]) (None)  Attention Current Status (D4081) (None)  Attention Goal Status (K4818) (None)  Attention Discharge Status (H6314) (None)  Memory Current Status (H7026) (None)  Memory Goal Status (V7858) (None)  Memory Discharge Status (I5027) (None)  Voice Current Status (  G9171) (None)  Voice Goal Status [phone]) (None)  Voice Discharge Status [phone]) (None)  Other Speech-Language Pathology Functional Limitation Current Status [phone]) (None)  Other Speech-Language Pathology Functional Limitation Goal Status (Q2411) (None)  Other Speech-Language Pathology Functional Limitation Discharge Status [phone]) (None)    Houston Siren 07/21/2017, 1:52 PM  Orbie Pyo Colvin Caroli.[employer] [phone]

## 2017-07-24 DIAGNOSIS — Z510 Encounter for antineoplastic radiation therapy: Secondary | ICD-10-CM | POA: Diagnosis not present

## 2017-07-24 DIAGNOSIS — D420 Neoplasm of uncertain behavior of cerebral meninges: Secondary | ICD-10-CM | POA: Diagnosis not present

## 2017-07-24 DIAGNOSIS — D329 Benign neoplasm of meninges, unspecified: Secondary | ICD-10-CM | POA: Diagnosis not present

## 2017-07-27 ENCOUNTER — Other Ambulatory Visit: Payer: Self-pay | Admitting: Internal Medicine

## 2017-07-28 DIAGNOSIS — H04123 Dry eye syndrome of bilateral lacrimal glands: Secondary | ICD-10-CM | POA: Diagnosis not present

## 2017-07-28 DIAGNOSIS — H353132 Nonexudative age-related macular degeneration, bilateral, intermediate dry stage: Secondary | ICD-10-CM | POA: Diagnosis not present

## 2017-08-11 ENCOUNTER — Encounter: Payer: Self-pay | Admitting: Internal Medicine

## 2017-08-11 ENCOUNTER — Ambulatory Visit (INDEPENDENT_AMBULATORY_CARE_PROVIDER_SITE_OTHER): Payer: Medicare Other | Admitting: Internal Medicine

## 2017-08-11 VITALS — BP 150/80 | HR 65 | Temp 98.2°F | Wt 111.8 lb

## 2017-08-11 DIAGNOSIS — M79601 Pain in right arm: Secondary | ICD-10-CM | POA: Diagnosis not present

## 2017-08-11 DIAGNOSIS — I10 Essential (primary) hypertension: Secondary | ICD-10-CM

## 2017-08-11 DIAGNOSIS — M150 Primary generalized (osteo)arthritis: Secondary | ICD-10-CM | POA: Diagnosis not present

## 2017-08-11 DIAGNOSIS — D500 Iron deficiency anemia secondary to blood loss (chronic): Secondary | ICD-10-CM | POA: Diagnosis not present

## 2017-08-11 DIAGNOSIS — E0590 Thyrotoxicosis, unspecified without thyrotoxic crisis or storm: Secondary | ICD-10-CM

## 2017-08-11 DIAGNOSIS — D320 Benign neoplasm of cerebral meninges: Secondary | ICD-10-CM | POA: Diagnosis not present

## 2017-08-11 DIAGNOSIS — M8949 Other hypertrophic osteoarthropathy, multiple sites: Secondary | ICD-10-CM

## 2017-08-11 DIAGNOSIS — M159 Polyosteoarthritis, unspecified: Secondary | ICD-10-CM

## 2017-08-11 LAB — CBC WITH DIFFERENTIAL/PLATELET
BASOS PCT: 0.8 % (ref 0.0–3.0)
Basophils Absolute: 0.1 10*3/uL (ref 0.0–0.1)
EOS PCT: 2.6 % (ref 0.0–5.0)
Eosinophils Absolute: 0.2 10*3/uL (ref 0.0–0.7)
HCT: 34.9 % — ABNORMAL LOW (ref 36.0–46.0)
Hemoglobin: 11.7 g/dL — ABNORMAL LOW (ref 12.0–15.0)
LYMPHS ABS: 1.5 10*3/uL (ref 0.7–4.0)
Lymphocytes Relative: 19.8 % (ref 12.0–46.0)
MCHC: 33.6 g/dL (ref 30.0–36.0)
MCV: 89 fl (ref 78.0–100.0)
MONO ABS: 0.8 10*3/uL (ref 0.1–1.0)
MONOS PCT: 10.8 % (ref 3.0–12.0)
Neutro Abs: 5 10*3/uL (ref 1.4–7.7)
Neutrophils Relative %: 66 % (ref 43.0–77.0)
Platelets: 278 10*3/uL (ref 150.0–400.0)
RBC: 3.92 Mil/uL (ref 3.87–5.11)
RDW: 15.9 % — AB (ref 11.5–15.5)
WBC: 7.5 10*3/uL (ref 4.0–10.5)

## 2017-08-11 MED ORDER — ISOSORBIDE MONONITRATE ER 30 MG PO TB24: 30.0000 mg | ORAL_TABLET | Freq: Every day | ORAL | 6 refills | Status: DC

## 2017-08-11 NOTE — Patient Instructions (Signed)
  Use Tylenol 3 times daily  Return in 1 month for follow-up  No change in your blood pressure medication at this time

## 2017-08-11 NOTE — Progress Notes (Signed)
Subjective:    Patient ID: [name], female    DOB: 1922/10/18, 84 y.o.   MRN: [id number]  HPI  84 year old patient who is seen today for follow-up.  For the past 2 weeks she has had some right upper arm discomfort.  There is been no history of trauma.  Pain seems localized to the upper arm and not the shoulder.  She has a history of meningioma and is status post prior craniotomy in 2009 and gamma knife treatment in 2010.  She has had a recent gamma knife stereotactic radiosurgery and has done well.  She has a history of essential hypertension.  She has been on Imdur in the past due to coronary artery disease as well as metoprolol.  Presently both his medications have been on hold.  Blood pressure readings especially systolic have been elevated periodically,  Laboratory studies reviewed.  1 month ago hematocrit dropped to 10.7  Past Medical History:  Diagnosis Date  . Abnormal CT of the chest    Stable patchy/nodular and ground-glass opacities in the right upper lobe, While grossly unchanged, low grade adenocarcinoma remains possible.  Marland Kitchen ANEMIA, PERNICIOUS 09/10/2006  . Bradycardia    August, 2013  . CORONARY ARTERY DISEASE 11/07/2008   a. NSTEMI 2001 & STEMI 2008 managed medically. b. Takotsubo 06/2006. c. Cath 2009 -> med rx.  . CVA WITH LEFT HEMIPARESIS 07/22/2007   May, 2009, neurology decided aspirin and Plavix at that time.  . DERMATITIS 03/02/2007  . DJD (degenerative joint disease)    L shoulder impingement  . Ejection fraction    EF 60%, echo, July, 2010, (  EF improved after tach a suitable event 2008)  . Family history of anesthesia complication    daughter has a hard time waking up  . H/O hiatal hernia   . HYPERTENSION 07/15/2006  . IBS (irritable bowel syndrome)   . MENINGIOMA 08/27/2006   Occipital meningioma, surgery, [facility], 2008 /  MRI, [place], June, 2011, no change in lesion, history believe the patient had a gamma knife treatment of a right occipital  atypical meningioma  . OSTEOARTHRITIS 07/15/2006  . PULMONARY NODULE 01/14/2007   [facility], December, 2011  . Stroke ([place])   . Subarachnoid hemorrhage following injury ([place]) 2007   Subarachnoid hemorrhage secondary to a fall December, 2007  . Takotsubo syndrome 04/22/2008   MI, May, 2008, question takotsubo event  . THYROID NODULE 01/14/2007     Social History   Socioeconomic History  . Marital status: Widowed    Spouse name: Not on file  . Number of children: 4  . Years of education: Not on file  . Highest education level: Not on file  Occupational History    Employer: RETIRED  Social Needs  . Financial resource strain: Not on file  . Food insecurity:    Worry: Not on file    Inability: Not on file  . Transportation needs:    Medical: Not on file    Non-medical: Not on file  Tobacco Use  . Smoking status: Never Smoker  . Smokeless tobacco: Never Used  Substance and Sexual Activity  . Alcohol use: No    Alcohol/week: 0.0 oz  . Drug use: No  . Sexual activity: Never    Birth control/protection: Surgical  Lifestyle  . Physical activity:    Days per week: Not on file    Minutes per session: Not on file  . Stress: Not on file  Relationships  . Social connections:  Talks on phone: Not on file    Gets together: Not on file    Attends religious service: Not on file    Active member of club or organization: Not on file    Attends meetings of clubs or organizations: Not on file    Relationship status: Not on file  . Intimate partner violence:    Fear of current or ex partner: Not on file    Emotionally abused: Not on file    Physically abused: Not on file    Forced sexual activity: Not on file  Other Topics Concern  . Not on file  Social History Narrative   Widowed. Lives in [place], [place] with daughter.     Past Surgical History:  Procedure Laterality Date  . ABDOMINAL HYSTERECTOMY    . BRAIN TUMOR EXCISION     Meningioma; [facility] in [facility]  .  CHOLECYSTECTOMY    . JOINT REPLACEMENT     rt THR  . SHOULDER SURGERY      Family History  Problem Relation Age of Onset  . Heart attack Brother 84    Allergies  Allergen Reactions  . Atorvastatin Other (See Comments)    Raises liver enzymes (takes pravastatin at home)  . Influenza Vaccines Other (See Comments)    Unknown  . Phenytoin Other (See Comments)    Blood clots  . Promethazine Hcl Other (See Comments)    Severe sedation  . Sulfamethoxazole     REACTION: unspecified  Per pt daughter she stated this causes rash    Current Outpatient Medications on File Prior to Visit  Medication Sig Dispense Refill  . acetaminophen (TYLENOL) 325 MG tablet Take 325 mg by mouth every 6 (six) hours as needed for mild pain or moderate pain.    Marland Kitchen clopidogrel (PLAVIX) 75 MG tablet TAKE 1 TABLET DAILY WITH BREAKFAST 90 tablet 1  . cyanocobalamin (,VITAMIN B-12,) 1000 MCG/ML injection INJECT 1 ML (1000 MCG TOTAL) INTRAMUSCULARLY EVERY 30 DAYS 3 mL 3  . cycloSPORINE (RESTASIS) 0.05 % ophthalmic emulsion 1 drop 2 (two) times daily.    . feeding supplement, ENSURE ENLIVE, (ENSURE ENLIVE) LIQD Take 237 mLs by mouth 2 (two) times daily between meals. 237 mL 12  . fluticasone (FLONASE) 50 MCG/ACT nasal spray Place 2 sprays into both nostrils daily. 16 g 6  . methimazole (TAPAZOLE) 5 MG tablet TAKE 1 TABLET THREE TIMES A WEEK, MONDAY, WEDNESDAY, AND FRIDAY (Patient taking differently: TAKE 1 TABLET (5 MG TOTALLY) THREE TIMES A WEEK, MON, WED, AND FRI) 45 tablet 1  . NEEDLE, DISP, 25 G (BD DISP NEEDLES) 25G X 5/8" MISC USE ONCE A MONTH FOR B12 INJECTIONS AS DIRECTED 3 each 4  . nitroGLYCERIN (NITROSTAT) 0.4 MG SL tablet Place 1 tablet (0.4 mg total) under the tongue every 5 (five) minutes as needed. For chest pain. 15 tablet 3  . OVER THE COUNTER MEDICATION Take 1 Package by mouth See admin instructions. Take 1 pack of Spark advocare by mouth everyday for energy    . pravastatin (PRAVACHOL) 20 MG tablet  TAKE 1 TABLET DAILY IN THE EVENING 90 tablet 1  . PREMARIN vaginal cream USE 0.5 GRAM VAGINALLY TWICE A WEEK AS NEEDED 90 g 1  . Syringe, Disposable, (B-D SYRINGE LUER-LOK 3CC) 3 ML MISC USE AS DIRECTED ONCE A MONTH FOR B12 INJECTIONS 3 each 5  . polyethylene glycol (MIRALAX / GLYCOLAX) packet Take 17 g by mouth daily as needed for mild constipation. (Patient not taking:  Reported on 08/11/2017) 14 each 0   No current facility-administered medications on file prior to visit.     BP (!) 150/80 (BP Location: Right Arm, Patient Position: Sitting, Cuff Size: Normal)   Pulse 65   Temp 98.2 F (36.8 C) (Oral)   Wt 111 lb 12.8 oz (50.7 kg)   SpO2 96%   BMI 19.19 kg/m     Review of Systems  Constitutional: Negative.   HENT: Negative for congestion, dental problem, hearing loss, rhinorrhea, sinus pressure, sore throat and tinnitus.   Eyes: Negative for pain, discharge and visual disturbance.  Respiratory: Negative for cough and shortness of breath.   Cardiovascular: Negative for chest pain, palpitations and leg swelling.  Gastrointestinal: Negative for abdominal distention, abdominal pain, blood in stool, constipation, diarrhea, nausea and vomiting.  Genitourinary: Negative for difficulty urinating, dysuria, flank pain, frequency, hematuria, pelvic pain, urgency, vaginal bleeding, vaginal discharge and vaginal pain.  Musculoskeletal: Negative for arthralgias, gait problem and joint swelling.       Right upper arm pain  Skin: Negative for rash.  Neurological: Negative for dizziness, syncope, speech difficulty, weakness, numbness and headaches.  Hematological: Negative for adenopathy.  Psychiatric/Behavioral: Negative for agitation, behavioral problems and dysphoric mood. The patient is not nervous/anxious.        Objective:   Physical Exam  Constitutional: She is oriented to person, place, and time. She appears well-developed and well-nourished.  Blood pressure 160/70  HENT:  Head:  Normocephalic.  Right Ear: External ear normal.  Left Ear: External ear normal.  Mouth/Throat: Oropharynx is clear and moist.  Eyes: Pupils are equal, round, and reactive to light. Conjunctivae and EOM are normal.  Neck: Normal range of motion. Neck supple. No thyromegaly present.  Cardiovascular: Normal rate, regular rhythm, normal heart sounds and intact distal pulses.  Pulmonary/Chest: Effort normal and breath sounds normal.  Abdominal: Soft. Bowel sounds are normal. She exhibits no mass. There is no tenderness.  Musculoskeletal: Normal range of motion.  Tenderness to gentle palpation over the right upper arm.  No soft tissue inflammatory changes noted.  No swelling range of motion of the upper arm did not appear to cause shoulder discomfort  Lymphadenopathy:    She has no cervical adenopathy.  Neurological: She is alert and oriented to person, place, and time.  Skin: Skin is warm and dry. No rash noted.  Psychiatric: She has a normal mood and affect. Her behavior is normal.          Assessment & Plan:   Right upper arm pain.  The patient has been using only Tylenol at bedtime which has been very helpful pain is only been of 2 weeks duration.  Will treat more intensively with Tylenol and observe  Status post gamma knife surgery for recurrent meningioma  Hypertension.  Daughter states that patient has done quite well with isosorbide  mononitrate.  Will resume and continue to monitor blood pressure carefully.  Recheck 4 weeks  History of anemia.  Will check follow-up CBC  Marletta Lor

## 2017-09-10 ENCOUNTER — Encounter: Payer: Self-pay | Admitting: Internal Medicine

## 2017-09-11 ENCOUNTER — Encounter: Payer: Self-pay | Admitting: Internal Medicine

## 2017-09-11 ENCOUNTER — Ambulatory Visit (INDEPENDENT_AMBULATORY_CARE_PROVIDER_SITE_OTHER): Payer: Medicare Other | Admitting: Internal Medicine

## 2017-09-11 ENCOUNTER — Ambulatory Visit (INDEPENDENT_AMBULATORY_CARE_PROVIDER_SITE_OTHER): Payer: Medicare Other

## 2017-09-11 VITALS — BP 140/70 | HR 67 | Temp 99.3°F | Wt 110.8 lb

## 2017-09-11 DIAGNOSIS — M79621 Pain in right upper arm: Secondary | ICD-10-CM

## 2017-09-11 DIAGNOSIS — I10 Essential (primary) hypertension: Secondary | ICD-10-CM

## 2017-09-11 DIAGNOSIS — R911 Solitary pulmonary nodule: Secondary | ICD-10-CM

## 2017-09-11 NOTE — Patient Instructions (Signed)
Most patients with low back pain will improve with time over the next two to 6 weeks.  Keep active but avoid any activities that cause pain.  Apply moist heat to the low back area several times daily.  Call or return to clinic prn if these symptoms worsen or fail to improve as anticipated.  Follow-up with Dr. Jerilee Hoh in 3 months

## 2017-09-11 NOTE — Progress Notes (Signed)
   Subjective:    Patient ID: [name], female    DOB: 01-19-1923, 84 y.o.   MRN: [id number]  HPI 84 year old patient who is seen today for follow-up. She was seen 1 month ago and at that time complained of a 2-week history of right upper arm discomfort.  Pain seems localized to the right upper arm and not involving the shoulder area.  She did have a fall in May.  Chest x-ray at that time revealed a 12 mm right upper lobe nodule.  This was discussed with the patient and family who did not wish any further evaluation She has a history of meningioma and has done fairly well following a recent gamma knife stereotactic radiosurgery.  She had an initial craniotomy in 2009 and subsequent gamma knife treatment in 2010 She has a history of hypertension in both Imdur and metoprolol have been on hold.  Blood pressure was a bit elevated 1 month ago and she is asked return today in follow-up.  Isosorbide mononitrate was resumed.  Blood pressure has been much better controlled Laboratory studies also revealed a drop in her hemoglobin to 10.7.  Follow-up hemoglobin improved to 11.35-month ago   Review of Systems  Constitutional: Positive for fatigue.  HENT: Negative for congestion, dental problem, hearing loss, rhinorrhea, sinus pressure, sore throat and tinnitus.   Eyes: Negative for pain, discharge and visual disturbance.  Respiratory: Negative for cough and shortness of breath.   Cardiovascular: Negative for chest pain, palpitations and leg swelling.  Gastrointestinal: Negative for abdominal distention, abdominal pain, blood in stool, constipation, diarrhea, nausea and vomiting.  Genitourinary: Negative for difficulty urinating, dysuria, flank pain, frequency, hematuria, pelvic pain, urgency, vaginal bleeding, vaginal discharge and vaginal pain.  Musculoskeletal: Negative for arthralgias, gait problem and joint swelling.       Right upper arm pain  Skin: Negative for rash.  Neurological: Negative  for dizziness, syncope, speech difficulty, weakness, numbness and headaches.  Hematological: Negative for adenopathy.  Psychiatric/Behavioral: Positive for confusion. Negative for agitation, behavioral problems and dysphoric mood. The patient is nervous/anxious.        Objective:   Physical Exam  Constitutional: She appears well-developed and well-nourished. No distress.  Blood pressure 122/62  Musculoskeletal:  Tenderness involving gentle palpation of the right upper arm.  No inflammatory changes noted          Assessment & Plan:   Essential hypertension.  Blood pressure well controlled today.  No change in therapy History of anemia improved Pulmonary nodule.  Consider follow-up chest x-ray in 6 months Right upper arm pain.  Will review radiographs  Marletta Lor

## 2017-09-11 NOTE — Telephone Encounter (Signed)
Pt was seen in the office for this issue today! No further action needed!

## 2017-09-17 DIAGNOSIS — L84 Corns and callosities: Secondary | ICD-10-CM | POA: Diagnosis not present

## 2017-09-17 DIAGNOSIS — L603 Nail dystrophy: Secondary | ICD-10-CM | POA: Diagnosis not present

## 2017-09-17 DIAGNOSIS — I739 Peripheral vascular disease, unspecified: Secondary | ICD-10-CM | POA: Diagnosis not present

## 2017-10-04 ENCOUNTER — Other Ambulatory Visit: Payer: Self-pay | Admitting: Endocrinology

## 2017-10-07 ENCOUNTER — Ambulatory Visit: Payer: Medicare Other | Admitting: Internal Medicine

## 2017-10-22 ENCOUNTER — Ambulatory Visit: Payer: Medicare Other | Admitting: Endocrinology

## 2017-10-28 ENCOUNTER — Ambulatory Visit (INDEPENDENT_AMBULATORY_CARE_PROVIDER_SITE_OTHER): Payer: Medicare Other | Admitting: Endocrinology

## 2017-10-28 ENCOUNTER — Encounter: Payer: Self-pay | Admitting: Endocrinology

## 2017-10-28 VITALS — BP 122/64 | HR 66 | Ht 64.0 in | Wt 115.8 lb

## 2017-10-28 DIAGNOSIS — E0590 Thyrotoxicosis, unspecified without thyrotoxic crisis or storm: Secondary | ICD-10-CM

## 2017-10-28 LAB — TSH: TSH: 1.5 u[IU]/mL (ref 0.35–4.50)

## 2017-10-28 LAB — T4, FREE: Free T4: 1.15 ng/dL (ref 0.60–1.60)

## 2017-10-28 NOTE — Patient Instructions (Signed)
blood tests are requested for you today.  We'll let you know about the result.  If ever you have fever while taking methimazole, stop it and call us, even if the reason is obvious, because of the risk of a rare side-effect.   Please come back for a follow-up appointment in 6 months.

## 2017-10-28 NOTE — Progress Notes (Signed)
Subjective:    Patient ID: [name], female    DOB: May 22, 1922, 84 y.o.   MRN: [id number]  HPI Pt returns for f/u of hyperthyroidism (dx'ed 2013; she was started on tapazole, as she can't be isolated due to frail elderly state; she was also noted to have a thyroid nodule, but w/u was felt not to be worthwhile, due to her advanced age and health problems).  She has regained a few lbs.  She takes tapazole as rx'ed.    Past Medical History:  Diagnosis Date  . Abnormal CT of the chest    Stable patchy/nodular and ground-glass opacities in the right upper lobe, While grossly unchanged, low grade adenocarcinoma remains possible.  Marland Kitchen ANEMIA, PERNICIOUS 09/10/2006  . Bradycardia    August, 2013  . CORONARY ARTERY DISEASE 11/07/2008   a. NSTEMI 2001 & STEMI 2008 managed medically. b. Takotsubo 06/2006. c. Cath 2009 -> med rx.  . CVA WITH LEFT HEMIPARESIS 07/22/2007   May, 2009, neurology decided aspirin and Plavix at that time.  . DERMATITIS 03/02/2007  . DJD (degenerative joint disease)    L shoulder impingement  . Ejection fraction    EF 60%, echo, July, 2010, (  EF improved after tach a suitable event 2008)  . Family history of anesthesia complication    daughter has a hard time waking up  . H/O hiatal hernia   . HYPERTENSION 07/15/2006  . IBS (irritable bowel syndrome)   . MENINGIOMA 08/27/2006   Occipital meningioma, surgery, [facility], 2008 /  MRI, [place], June, 2011, no change in lesion, history believe the patient had a gamma knife treatment of a right occipital atypical meningioma  . OSTEOARTHRITIS 07/15/2006  . PULMONARY NODULE 01/14/2007   [facility], December, 2011  . Stroke ([place])   . Subarachnoid hemorrhage following injury ([place]) 2007   Subarachnoid hemorrhage secondary to a fall December, 2007  . Takotsubo syndrome 04/22/2008   MI, May, 2008, question takotsubo event  . THYROID NODULE 01/14/2007    Past Surgical History:  Procedure Laterality Date  . ABDOMINAL HYSTERECTOMY      . BRAIN TUMOR EXCISION     Meningioma; [facility] in [facility]  . CHOLECYSTECTOMY    . JOINT REPLACEMENT     rt THR  . SHOULDER SURGERY      Social History   Socioeconomic History  . Marital status: Widowed    Spouse name: Not on file  . Number of children: 4  . Years of education: Not on file  . Highest education level: Not on file  Occupational History    Employer: RETIRED  Social Needs  . Financial resource strain: Not on file  . Food insecurity:    Worry: Not on file    Inability: Not on file  . Transportation needs:    Medical: Not on file    Non-medical: Not on file  Tobacco Use  . Smoking status: Never Smoker  . Smokeless tobacco: Never Used  Substance and Sexual Activity  . Alcohol use: No    Alcohol/week: 0.0 standard drinks  . Drug use: No  . Sexual activity: Never    Birth control/protection: Surgical  Lifestyle  . Physical activity:    Days per week: Not on file    Minutes per session: Not on file  . Stress: Not on file  Relationships  . Social connections:    Talks on phone: Not on file    Gets together: Not on file    Attends religious  service: Not on file    Active member of club or organization: Not on file    Attends meetings of clubs or organizations: Not on file    Relationship status: Not on file  . Intimate partner violence:    Fear of current or ex partner: Not on file    Emotionally abused: Not on file    Physically abused: Not on file    Forced sexual activity: Not on file  Other Topics Concern  . Not on file  Social History Narrative   Widowed. Lives in [place], [place] with daughter.     Current Outpatient Medications on File Prior to Visit  Medication Sig Dispense Refill  . acetaminophen (TYLENOL) 325 MG tablet Take 325 mg by mouth every 6 (six) hours as needed for mild pain or moderate pain.    Marland Kitchen clopidogrel (PLAVIX) 75 MG tablet TAKE 1 TABLET DAILY WITH BREAKFAST 90 tablet 1  . cyanocobalamin (,VITAMIN B-12,) 1000 MCG/ML  injection INJECT 1 ML (1000 MCG TOTAL) INTRAMUSCULARLY EVERY 30 DAYS 3 mL 3  . cycloSPORINE (RESTASIS) 0.05 % ophthalmic emulsion 1 drop 2 (two) times daily.    . feeding supplement, ENSURE ENLIVE, (ENSURE ENLIVE) LIQD Take 237 mLs by mouth 2 (two) times daily between meals. 237 mL 12  . isosorbide mononitrate (IMDUR) 30 MG 24 hr tablet Take 1 tablet (30 mg total) by mouth daily. (Patient taking differently: Take 30 mg by mouth daily. Pt takes 1/2 tab daily) 30 tablet 6  . methimazole (TAPAZOLE) 5 MG tablet TAKE 1 TABLET (5 MG TOTALLY) THREE TIMES A WEEK, MON, WED, AND FRI 45 tablet 4  . NEEDLE, DISP, 25 G (BD DISP NEEDLES) 25G X 5/8" MISC USE ONCE A MONTH FOR B12 INJECTIONS AS DIRECTED 3 each 4  . nitroGLYCERIN (NITROSTAT) 0.4 MG SL tablet Place 1 tablet (0.4 mg total) under the tongue every 5 (five) minutes as needed. For chest pain. 15 tablet 3  . OVER THE COUNTER MEDICATION Take 1 Package by mouth See admin instructions. Take 1 pack of Spark advocare by mouth everyday for energy    . polyethylene glycol (MIRALAX / GLYCOLAX) packet Take 17 g by mouth daily as needed for mild constipation. 14 each 0  . pravastatin (PRAVACHOL) 20 MG tablet TAKE 1 TABLET DAILY IN THE EVENING 90 tablet 1  . PREMARIN vaginal cream USE 0.5 GRAM VAGINALLY TWICE A WEEK AS NEEDED 90 g 1  . Syringe, Disposable, (B-D SYRINGE LUER-LOK 3CC) 3 ML MISC USE AS DIRECTED ONCE A MONTH FOR B12 INJECTIONS 3 each 5   No current facility-administered medications on file prior to visit.     Allergies  Allergen Reactions  . Atorvastatin Other (See Comments)    Raises liver enzymes (takes pravastatin at home)  . Fluorometholone     Falling down  . Influenza Vaccines Other (See Comments)    Unknown  . Phenytoin Other (See Comments)    Blood clots  . Promethazine Hcl Other (See Comments)    Severe sedation  . Sulfamethoxazole     REACTION: unspecified  Per pt daughter she stated this causes rash    Family History  Problem  Relation Age of Onset  . Heart attack Brother 84    BP 122/64 (BP Location: Right Arm)   Pulse 66   Ht 5\' 4"  (1.626 m)   Wt 115 lb 12.8 oz (52.5 kg)   SpO2 98%   BMI 19.88 kg/m    Review of Systems Denies fever.  Objective:   Physical Exam  Vital signs: see vs page Gen: elderly, frail, no distress NECK: There is no palpable thyroid enlargement.  No thyroid nodule is palpable.  No palpable lymphadenopathy at the anterior neck.     Lab Results  Component Value Date   TSH 1.50 10/28/2017      Assessment & Plan:  Hyperthyroidism: well-controlled.  Please continue the same medication Thyroid nodule: no w/u is indicated

## 2017-11-11 ENCOUNTER — Ambulatory Visit (INDEPENDENT_AMBULATORY_CARE_PROVIDER_SITE_OTHER): Payer: Medicare Other | Admitting: Family Medicine

## 2017-11-11 ENCOUNTER — Encounter: Payer: Self-pay | Admitting: Family Medicine

## 2017-11-11 VITALS — BP 130/80 | HR 80 | Temp 97.8°F | Wt 116.9 lb

## 2017-11-11 DIAGNOSIS — Z111 Encounter for screening for respiratory tuberculosis: Secondary | ICD-10-CM | POA: Diagnosis not present

## 2017-11-11 NOTE — Patient Instructions (Signed)
We recommend for your long-term safety and health to be in an assisted living facility

## 2017-11-11 NOTE — Progress Notes (Signed)
Floriene is a combative 84 year old female who comes in today accompanied by her daughter who wants [place] to approve her mom going to an assisted living place.  Lynnae wants to go home by herself.  She is 4 and is not able to take care of her ADLs therefore I would concur she needs to be in assisted living  .vs BP 130/80 (BP Location: Right Arm, Patient Position: Sitting, Cuff Size: Normal)   Pulse 80   Temp 97.8 F (36.6 C) (Oral)   Wt 116 lb 14.4 oz (53 kg)   SpO2 98%   BMI 20.07 kg/m  No exam done today  1.  Dementia age-related.........Marland Kitchen recommended long-term assisted living  2.  Hypertension  3.  History of coronary artery disease  4.  History of subarachnoid hemorrhage following a fall  5.  History of meningioma

## 2017-11-18 ENCOUNTER — Ambulatory Visit (INDEPENDENT_AMBULATORY_CARE_PROVIDER_SITE_OTHER): Payer: Medicare Other | Admitting: Family Medicine

## 2017-11-18 ENCOUNTER — Encounter: Payer: Self-pay | Admitting: Family Medicine

## 2017-11-18 VITALS — BP 140/80 | HR 65 | Temp 97.8°F | Resp 16 | Ht 64.0 in

## 2017-11-18 DIAGNOSIS — R519 Headache, unspecified: Secondary | ICD-10-CM

## 2017-11-18 DIAGNOSIS — D420 Neoplasm of uncertain behavior of cerebral meninges: Secondary | ICD-10-CM | POA: Diagnosis not present

## 2017-11-18 DIAGNOSIS — R51 Headache: Secondary | ICD-10-CM | POA: Diagnosis not present

## 2017-11-18 MED ORDER — TRAMADOL HCL 50 MG PO TABS: 50.0000 mg | ORAL_TABLET | Freq: Two times a day (BID) | ORAL | 0 refills | Status: AC | PRN

## 2017-11-18 MED ORDER — ONDANSETRON HCL 4 MG PO TABS: 4.0000 mg | ORAL_TABLET | Freq: Two times a day (BID) | ORAL | 0 refills | Status: AC | PRN

## 2017-11-18 NOTE — Progress Notes (Signed)
ACUTE VISIT   HPI:  Chief Complaint  Patient presents with  . Headache    started this morning    [name] is a 84 y.o. female, who is here today with her daughter complaining of global headache that started this morning. Daughter provides most of the history. According to her daughter, she has had headaches but usually did not last this long and alleviated by Tylenol x1. She is also complaining about blurry vision.  Today she has taken Tylenol x2, still having headache.  Mild nausea and "very" dizziness when she moves fast. Daughter is not sure about pain intensity but it seems to be severe, constant.  She has had some associated nausea but no vomiting.  Her daughter has not noted MS changes. No changes in appetite. She history of unstable gait, at home she has a cane and a walker, most of the time she used with a cane. Today here in the office she is in a wheelchair.  She has history of CVD with residual hemiplegia. Hx of atypical grade 2 meningioma.   S/P craniotomy with partial resection.and S/P gamma knife stereotactic radiosurgery on 07/24/2017. She follows with neurosurgeon, Dr. Salomon Fick and radiation oncologist, Dr Vallarie Mare.   According to records, she presented with visual changes in 2009, TIA work-up done, occipital convexity meningioma was discovered, which was responsible for visual changes.  Head MRI 06/14/17: No evidence of acute or subacute infarction. Extensive chronic small-vessel ischemic changes throughout the brain. Postoperative encephalomalacia in the right parieto-occipital brain with adjacent gliosis. Growing residual/recurrent meningioma tissue measuring in total about 3 cm in diameter, with a component along the medial intracranial margin, invading and occluding the sagittal sinus, with intraosseous extension, and with bulging into the soft tissues of the scalp.   Review of Systems  Constitutional: Positive for fatigue. Negative for  activity change, appetite change and fever.  HENT: Negative for mouth sores, nosebleeds and trouble swallowing.   Eyes: Positive for visual disturbance. Negative for redness.  Respiratory: Negative for cough, shortness of breath and wheezing.   Cardiovascular: Negative for chest pain, palpitations and leg swelling.  Gastrointestinal: Positive for nausea. Negative for abdominal pain and vomiting.       Negative for changes in bowel habits.  Genitourinary: Negative for decreased urine volume, dysuria and hematuria.  Neurological: Positive for dizziness and headaches. Negative for seizures and syncope.      Current Outpatient Medications on File Prior to Visit  Medication Sig Dispense Refill  . acetaminophen (TYLENOL) 325 MG tablet Take 325 mg by mouth every 6 (six) hours as needed for mild pain or moderate pain.    Marland Kitchen clopidogrel (PLAVIX) 75 MG tablet TAKE 1 TABLET DAILY WITH BREAKFAST 90 tablet 1  . cyanocobalamin (,VITAMIN B-12,) 1000 MCG/ML injection INJECT 1 ML (1000 MCG TOTAL) INTRAMUSCULARLY EVERY 30 DAYS 3 mL 3  . cycloSPORINE (RESTASIS) 0.05 % ophthalmic emulsion 1 drop 2 (two) times daily.    . feeding supplement, ENSURE ENLIVE, (ENSURE ENLIVE) LIQD Take 237 mLs by mouth 2 (two) times daily between meals. 237 mL 12  . isosorbide mononitrate (IMDUR) 30 MG 24 hr tablet Take 1 tablet (30 mg total) by mouth daily. (Patient taking differently: Take 30 mg by mouth daily. Pt takes 1/2 tab daily) 30 tablet 6  . methimazole (TAPAZOLE) 5 MG tablet TAKE 1 TABLET (5 MG TOTALLY) THREE TIMES A WEEK, MON, WED, AND FRI 45 tablet 4  . NEEDLE, DISP, 25 G (BD DISP NEEDLES)  25G X 5/8" MISC USE ONCE A MONTH FOR B12 INJECTIONS AS DIRECTED 3 each 4  . nitroGLYCERIN (NITROSTAT) 0.4 MG SL tablet Place 1 tablet (0.4 mg total) under the tongue every 5 (five) minutes as needed. For chest pain. 15 tablet 3  . OVER THE COUNTER MEDICATION Take 1 Package by mouth See admin instructions. Take 1 pack of Spark advocare  by mouth everyday for energy    . polyethylene glycol (MIRALAX / GLYCOLAX) packet Take 17 g by mouth daily as needed for mild constipation. 14 each 0  . pravastatin (PRAVACHOL) 20 MG tablet TAKE 1 TABLET DAILY IN THE EVENING 90 tablet 1  . PREMARIN vaginal cream USE 0.5 GRAM VAGINALLY TWICE A WEEK AS NEEDED 90 g 1  . Syringe, Disposable, (B-D SYRINGE LUER-LOK 3CC) 3 ML MISC USE AS DIRECTED ONCE A MONTH FOR B12 INJECTIONS 3 each 5   No current facility-administered medications on file prior to visit.      Past Medical History:  Diagnosis Date  . Abnormal CT of the chest    Stable patchy/nodular and ground-glass opacities in the right upper lobe, While grossly unchanged, low grade adenocarcinoma remains possible.  Marland Kitchen ANEMIA, PERNICIOUS 09/10/2006  . Bradycardia    August, 2013  . CORONARY ARTERY DISEASE 11/07/2008   a. NSTEMI 2001 & STEMI 2008 managed medically. b. Takotsubo 06/2006. c. Cath 2009 -> med rx.  . CVA WITH LEFT HEMIPARESIS 07/22/2007   May, 2009, neurology decided aspirin and Plavix at that time.  . DERMATITIS 03/02/2007  . DJD (degenerative joint disease)    L shoulder impingement  . Ejection fraction    EF 60%, echo, July, 2010, (  EF improved after tach a suitable event 2008)  . Family history of anesthesia complication    daughter has a hard time waking up  . H/O hiatal hernia   . HYPERTENSION 07/15/2006  . IBS (irritable bowel syndrome)   . MENINGIOMA 08/27/2006   Occipital meningioma, surgery, [facility], 2008 /  MRI, [place], June, 2011, no change in lesion, history believe the patient had a gamma knife treatment of a right occipital atypical meningioma  . OSTEOARTHRITIS 07/15/2006  . PULMONARY NODULE 01/14/2007   [facility], December, 2011  . Stroke ([place])   . Subarachnoid hemorrhage following injury ([place]) 2007   Subarachnoid hemorrhage secondary to a fall December, 2007  . Takotsubo syndrome 04/22/2008   MI, May, 2008, question takotsubo event  . THYROID NODULE 01/14/2007    Allergies  Allergen Reactions  . Atorvastatin Other (See Comments)    Raises liver enzymes (takes pravastatin at home)  . Fluorometholone     Falling down  . Influenza Vaccines Other (See Comments)    Unknown  . Phenytoin Other (See Comments)    Blood clots  . Promethazine Hcl Other (See Comments)    Severe sedation  . Sulfamethoxazole     REACTION: unspecified  Per pt daughter she stated this causes rash    Social History   Socioeconomic History  . Marital status: Widowed    Spouse name: Not on file  . Number of children: 4  . Years of education: Not on file  . Highest education level: Not on file  Occupational History    Employer: RETIRED  Social Needs  . Financial resource strain: Not on file  . Food insecurity:    Worry: Not on file    Inability: Not on file  . Transportation needs:    Medical: Not on file  Non-medical: Not on file  Tobacco Use  . Smoking status: Never Smoker  . Smokeless tobacco: Never Used  Substance and Sexual Activity  . Alcohol use: No    Alcohol/week: 0.0 standard drinks  . Drug use: No  . Sexual activity: Never    Birth control/protection: Surgical  Lifestyle  . Physical activity:    Days per week: Not on file    Minutes per session: Not on file  . Stress: Not on file  Relationships  . Social connections:    Talks on phone: Not on file    Gets together: Not on file    Attends religious service: Not on file    Active member of club or organization: Not on file    Attends meetings of clubs or organizations: Not on file    Relationship status: Not on file  Other Topics Concern  . Not on file  Social History Narrative   Widowed. Lives in [place], [place] with daughter.     Vitals:   11/18/17 1505  BP: 140/80  Pulse: 65  Resp: 16  Temp: 97.8 F (36.6 C)  SpO2: 98%   Body mass index is 20.07 kg/m.   Physical Exam  Nursing note and vitals reviewed. Constitutional: She appears well-developed. No distress.  HENT:   Head: Normocephalic and atraumatic.    Mouth/Throat: Oropharynx is clear and moist and mucous membranes are normal.  Firm mass on mid occipital area,3-4 cm. No skin changes and no tenderness.  Eyes: Conjunctivae are normal.  Cardiovascular: Normal rate and regular rhythm.  Respiratory: Effort normal and breath sounds normal. No respiratory distress.  GI: Soft. There is no tenderness.  Musculoskeletal: She exhibits no edema.  Neurological: She is alert. Gait abnormal.  No focal deficit appreciated. Oriented in place and person, disoriented in time. She is in a wheelchair.  Skin: Skin is warm. No rash noted. No erythema.  Psychiatric: She has a normal mood and affect.  Well groomed, good eye contact.    ASSESSMENT AND PLAN:   Ms. Aquarius was seen today for headache.  Diagnoses and all orders for this visit:  Headache, unspecified headache type  Headache is listed on her problem list. We discussed possible etiologies,? Tension headache. Mass effect or other complications related to atypical meningioma also needs to be considered.       We discussed options at this time, including pain management vs brain MRI today.        Daughter prefers pain management. We will  manage pain with tramadol, we discussed some side effects.  Zofran 4 mg twice daily as needed for nausea.  -     traMADol (ULTRAM) 50 MG tablet; Take 1 tablet (50 mg total) by mouth every 12 (twelve) hours as needed for up to 5 days. -     ondansetron (ZOFRAN) 4 MG tablet; Take 1 tablet (4 mg total) by mouth every 12 (twelve) hours as needed for up to 5 days for nausea or vomiting.  Atypical meningioma of brain [facility])  Daughter prefers to hold on brain MRI for now, she will call neurosurgeon tomorrow. Clearly instructed about warning signs. Daughter voices understanding.   Return if symptoms worsen or fail to improve.       Stephanne Greeley G. Martinique, MD  [facility]. [place]  office.

## 2017-11-18 NOTE — Patient Instructions (Signed)
A few things to remember from today's visit:   Headache, unspecified headache type - Plan: traMADol (ULTRAM) 50 MG tablet, ondansetron (ZOFRAN) 4 MG tablet  History of meningioma  Be careful with falls. Recommend using a walker instead your cane. Please call your oncologist, you may need brain MRI sooner than planned.

## 2017-11-20 DIAGNOSIS — Z86011 Personal history of benign neoplasm of the brain: Secondary | ICD-10-CM | POA: Diagnosis not present

## 2017-11-20 DIAGNOSIS — R410 Disorientation, unspecified: Secondary | ICD-10-CM | POA: Diagnosis not present

## 2017-11-20 DIAGNOSIS — G936 Cerebral edema: Secondary | ICD-10-CM | POA: Diagnosis not present

## 2017-11-20 DIAGNOSIS — F0390 Unspecified dementia without behavioral disturbance: Secondary | ICD-10-CM | POA: Diagnosis not present

## 2017-11-20 DIAGNOSIS — E039 Hypothyroidism, unspecified: Secondary | ICD-10-CM | POA: Diagnosis not present

## 2017-11-20 DIAGNOSIS — H547 Unspecified visual loss: Secondary | ICD-10-CM | POA: Diagnosis not present

## 2017-11-20 DIAGNOSIS — D420 Neoplasm of uncertain behavior of cerebral meninges: Secondary | ICD-10-CM | POA: Diagnosis not present

## 2017-11-20 DIAGNOSIS — D320 Benign neoplasm of cerebral meninges: Secondary | ICD-10-CM | POA: Diagnosis not present

## 2017-11-20 DIAGNOSIS — Z8673 Personal history of transient ischemic attack (TIA), and cerebral infarction without residual deficits: Secondary | ICD-10-CM | POA: Diagnosis not present

## 2017-11-20 DIAGNOSIS — I252 Old myocardial infarction: Secondary | ICD-10-CM | POA: Diagnosis not present

## 2017-11-20 DIAGNOSIS — Z7902 Long term (current) use of antithrombotics/antiplatelets: Secondary | ICD-10-CM | POA: Diagnosis not present

## 2017-11-20 DIAGNOSIS — R451 Restlessness and agitation: Secondary | ICD-10-CM | POA: Diagnosis not present

## 2017-11-20 DIAGNOSIS — D329 Benign neoplasm of meninges, unspecified: Secondary | ICD-10-CM | POA: Diagnosis not present

## 2017-11-25 DIAGNOSIS — E039 Hypothyroidism, unspecified: Secondary | ICD-10-CM | POA: Diagnosis not present

## 2017-11-25 DIAGNOSIS — I252 Old myocardial infarction: Secondary | ICD-10-CM | POA: Diagnosis not present

## 2017-11-25 DIAGNOSIS — F0390 Unspecified dementia without behavioral disturbance: Secondary | ICD-10-CM | POA: Diagnosis not present

## 2017-11-25 DIAGNOSIS — D329 Benign neoplasm of meninges, unspecified: Secondary | ICD-10-CM | POA: Diagnosis not present

## 2017-11-25 MED ORDER — ACETAMINOPHEN 325 MG PO TABS
650.00 | ORAL_TABLET | ORAL | Status: DC
Start: ? — End: 2017-11-25

## 2017-11-25 MED ORDER — PRAVASTATIN SODIUM 10 MG PO TABS
20.00 | ORAL_TABLET | ORAL | Status: DC
Start: 2017-11-25 — End: 2017-11-25

## 2017-11-25 MED ORDER — HYDRALAZINE HCL 20 MG/ML IJ SOLN
10.00 | INTRAMUSCULAR | Status: DC
Start: ? — End: 2017-11-25

## 2017-11-25 MED ORDER — METHIMAZOLE 5 MG PO TABS
5.00 | ORAL_TABLET | ORAL | Status: DC
Start: 2017-11-26 — End: 2017-11-25

## 2017-11-25 MED ORDER — SODIUM CHLORIDE 0.9 % IV SOLN
75.00 | INTRAVENOUS | Status: DC
Start: ? — End: 2017-11-25

## 2017-11-25 MED ORDER — CARBOXYMETHYLCELLULOSE SODIUM 0.5 % OP SOLN
1.00 | OPHTHALMIC | Status: DC
Start: ? — End: 2017-11-25

## 2017-11-25 MED ORDER — ISOSORBIDE MONONITRATE ER 30 MG PO TB24
15.00 | ORAL_TABLET | ORAL | Status: DC
Start: 2017-11-26 — End: 2017-11-25

## 2017-11-25 MED ORDER — CLOPIDOGREL BISULFATE 75 MG PO TABS
75.00 | ORAL_TABLET | ORAL | Status: DC
Start: 2017-11-26 — End: 2017-11-25

## 2017-11-25 MED ORDER — DEXAMETHASONE 4 MG PO TABS
4.00 | ORAL_TABLET | ORAL | Status: DC
Start: 2017-11-26 — End: 2017-11-25

## 2017-11-27 DIAGNOSIS — Z8673 Personal history of transient ischemic attack (TIA), and cerebral infarction without residual deficits: Secondary | ICD-10-CM | POA: Diagnosis not present

## 2017-11-27 DIAGNOSIS — D329 Benign neoplasm of meninges, unspecified: Secondary | ICD-10-CM | POA: Diagnosis not present

## 2017-11-27 DIAGNOSIS — Z96641 Presence of right artificial hip joint: Secondary | ICD-10-CM | POA: Diagnosis not present

## 2017-11-27 DIAGNOSIS — M1990 Unspecified osteoarthritis, unspecified site: Secondary | ICD-10-CM | POA: Diagnosis not present

## 2017-11-27 DIAGNOSIS — Z7902 Long term (current) use of antithrombotics/antiplatelets: Secondary | ICD-10-CM | POA: Diagnosis not present

## 2017-11-27 DIAGNOSIS — I10 Essential (primary) hypertension: Secondary | ICD-10-CM | POA: Diagnosis not present

## 2017-11-27 DIAGNOSIS — H9190 Unspecified hearing loss, unspecified ear: Secondary | ICD-10-CM | POA: Diagnosis not present

## 2017-11-27 DIAGNOSIS — I252 Old myocardial infarction: Secondary | ICD-10-CM | POA: Diagnosis not present

## 2017-11-27 DIAGNOSIS — E039 Hypothyroidism, unspecified: Secondary | ICD-10-CM | POA: Diagnosis not present

## 2017-11-27 DIAGNOSIS — Z9181 History of falling: Secondary | ICD-10-CM | POA: Diagnosis not present

## 2017-11-28 ENCOUNTER — Telehealth: Payer: Self-pay | Admitting: Internal Medicine

## 2017-11-28 NOTE — Telephone Encounter (Signed)
Copied from [place] [id number]. Topic: Quick Communication - Home Health Verbal Orders >> Nov 28, 2017  8:53 AM Blase Mess A wrote: Caller/Agency: [facility] Number: [phone] Requesting OT/PT/Skilled Nursing/Social Work: [place], PT Frequency: 1x a week for 3 week 1 week 3, Report elevated BP 158/80,

## 2017-11-28 NOTE — Telephone Encounter (Signed)
Verbal orders given to [facility] via vm.

## 2017-12-04 NOTE — Progress Notes (Signed)
Cardiology Office Note:    Date:  12/05/2017   ID:  [name], DOB 06/25/1922, MRN [id number]  PCP:  Marletta Lor, MD  Cardiologist:  Shirlee More, MD    Referring MD: Marletta Lor, MD    ASSESSMENT:    1. Takotsubo syndrome   2. Essential hypertension    PLAN:    In order of problems listed above:  1. Stable asymptomatic she will continue current treatment clopidogrel and a low intensity statin and oral nitrates.  I would not advise an ischemia evaluation at this time 2. Stable blood pressure target 3. Dyslipidemia stable continue statin   Next appointment: One year Dr. Wynonia Lawman   Medication Adjustments/Labs and Tests Ordered: Current medicines are reviewed at length with the patient today.  Concerns regarding medicines are outlined above.  No orders of the defined types were placed in this encounter.  No orders of the defined types were placed in this encounter.   No chief complaint on file.   History of Present Illness:    [name] is a 84 y.o. female with a hx of Takotsubo syndrome in 2008 and stroke  last seen  on 11/29/2016.  An echocardiogram was performed in May that showed normal left ventricular function and mild aortic regurgitation.  He has a history of meningioma and craniotomy in 2008 gamma knife surgery 2010 and a recurrence in June 2019 followed [facility] neurosurgery Compliance with diet, lifestyle and medications: Yes  she has not done as well since her last procedure is transition living in her daughter's home she has had no chest pain palpitations syncope or TIA her EKG today shows sinus rhythm with frequent APCs she has had no chest pain. Past Medical History:  Diagnosis Date  . Abnormal CT of the chest    Stable patchy/nodular and ground-glass opacities in the right upper lobe, While grossly unchanged, low grade adenocarcinoma remains possible.  Marland Kitchen ANEMIA, PERNICIOUS 09/10/2006  . Bradycardia    August, 2013  .  CORONARY ARTERY DISEASE 11/07/2008   a. NSTEMI 2001 & STEMI 2008 managed medically. b. Takotsubo 06/2006. c. Cath 2009 -> med rx.  . CVA WITH LEFT HEMIPARESIS 07/22/2007   May, 2009, neurology decided aspirin and Plavix at that time.  . DERMATITIS 03/02/2007  . DJD (degenerative joint disease)    L shoulder impingement  . Ejection fraction    EF 60%, echo, July, 2010, (  EF improved after tach a suitable event 2008)  . Family history of anesthesia complication    daughter has a hard time waking up  . H/O hiatal hernia   . HYPERTENSION 07/15/2006  . IBS (irritable bowel syndrome)   . MENINGIOMA 08/27/2006   Occipital meningioma, surgery, [facility], 2008 /  MRI, [place], June, 2011, no change in lesion, history believe the patient had a gamma knife treatment of a right occipital atypical meningioma  . OSTEOARTHRITIS 07/15/2006  . PULMONARY NODULE 01/14/2007   [facility], December, 2011  . Stroke ([place])   . Subarachnoid hemorrhage following injury ([place]) 2007   Subarachnoid hemorrhage secondary to a fall December, 2007  . Takotsubo syndrome 04/22/2008   MI, May, 2008, question takotsubo event  . THYROID NODULE 01/14/2007    Past Surgical History:  Procedure Laterality Date  . ABDOMINAL HYSTERECTOMY    . BRAIN TUMOR EXCISION     Meningioma; [facility] at [facility] in [facility]  . CHOLECYSTECTOMY    . JOINT REPLACEMENT     rt THR  . SHOULDER  SURGERY      Current Medications: Current Meds  Medication Sig  . acetaminophen (TYLENOL) 325 MG tablet Take 325 mg by mouth every 6 (six) hours as needed for mild pain or moderate pain.  Marland Kitchen clopidogrel (PLAVIX) 75 MG tablet TAKE 1 TABLET DAILY WITH BREAKFAST  . cyanocobalamin (,VITAMIN B-12,) 1000 MCG/ML injection INJECT 1 ML (1000 MCG TOTAL) INTRAMUSCULARLY EVERY 30 DAYS  . cycloSPORINE (RESTASIS) 0.05 % ophthalmic emulsion 1 drop 2 (two) times daily.  Marland Kitchen dexamethasone (DECADRON) 2 MG tablet Take 1.5 tablets by mouth daily.  . feeding supplement, ENSURE ENLIVE,  (ENSURE ENLIVE) LIQD Take 237 mLs by mouth 2 (two) times daily between meals.  . isosorbide mononitrate (IMDUR) 30 MG 24 hr tablet Take 1 tablet (30 mg total) by mouth daily. (Patient taking differently: Take 15 mg by mouth daily. )  . methimazole (TAPAZOLE) 5 MG tablet TAKE 1 TABLET (5 MG TOTALLY) THREE TIMES A WEEK, MON, WED, AND FRI  . NEEDLE, DISP, 25 G (BD DISP NEEDLES) 25G X 5/8" MISC USE ONCE A MONTH FOR B12 INJECTIONS AS DIRECTED  . nitroGLYCERIN (NITROSTAT) 0.4 MG SL tablet Place 1 tablet (0.4 mg total) under the tongue every 5 (five) minutes as needed. For chest pain.  Marland Kitchen OVER THE COUNTER MEDICATION Take 1 Package by mouth See admin instructions. Take 1 pack of Spark advocare by mouth everyday for energy  . polyethylene glycol (MIRALAX / GLYCOLAX) packet Take 17 g by mouth daily as needed for mild constipation.  . pravastatin (PRAVACHOL) 20 MG tablet TAKE 1 TABLET DAILY IN THE EVENING  . PREMARIN vaginal cream USE 0.5 GRAM VAGINALLY TWICE A WEEK AS NEEDED  . Syringe, Disposable, (B-D SYRINGE LUER-LOK 3CC) 3 ML MISC USE AS DIRECTED ONCE A MONTH FOR B12 INJECTIONS     Allergies:   Atorvastatin; Fluorometholone; Influenza vaccines; Phenytoin; Promethazine hcl; and Sulfamethoxazole   Social History   Socioeconomic History  . Marital status: Widowed    Spouse name: Not on file  . Number of children: 4  . Years of education: Not on file  . Highest education level: Not on file  Occupational History    Employer: RETIRED  Social Needs  . Financial resource strain: Not on file  . Food insecurity:    Worry: Not on file    Inability: Not on file  . Transportation needs:    Medical: Not on file    Non-medical: Not on file  Tobacco Use  . Smoking status: Never Smoker  . Smokeless tobacco: Never Used  Substance and Sexual Activity  . Alcohol use: No    Alcohol/week: 0.0 standard drinks  . Drug use: No  . Sexual activity: Never    Birth control/protection: Surgical  Lifestyle  .  Physical activity:    Days per week: Not on file    Minutes per session: Not on file  . Stress: Not on file  Relationships  . Social connections:    Talks on phone: Not on file    Gets together: Not on file    Attends religious service: Not on file    Active member of club or organization: Not on file    Attends meetings of clubs or organizations: Not on file    Relationship status: Not on file  Other Topics Concern  . Not on file  Social History Narrative   Widowed. Lives in [place], [place] with daughter.      Family History: The patient's family history includes Diabetes in her  brother; Heart attack (age of onset: 45) in her brother; Stroke in her brother and sister. ROS:   Please see the history of present illness.    All other systems reviewed and are negative.  EKGs/Labs/Other Studies Reviewed:    The following studies were reviewed today:  EKG:  EKG ordered today.  The ekg ordered today demonstrates sinus rhythm frequent APCs nonspecific ST  Recent Labs: 06/14/2017: ALT 12; BUN 10; Creatinine, Ser 0.84; Magnesium 1.8; Potassium 3.6; Sodium 139 08/11/2017: Hemoglobin 11.7; Platelets 278.0 10/28/2017: TSH 1.50  Recent Lipid Panel    Component Value Date/Time   CHOL 172 11/04/2016 1152   TRIG 109.0 11/04/2016 1152   HDL 65.00 11/04/2016 1152   CHOLHDL 3 11/04/2016 1152   VLDL 21.8 11/04/2016 1152   LDLCALC 85 11/04/2016 1152    Physical Exam:    VS:  BP 118/62 (BP Location: Right Arm, Patient Position: Sitting, Cuff Size: Normal)   Pulse 96   Ht 5' 4.5" (1.638 m)   Wt 115 lb (52.2 kg)   SpO2 98%   BMI 19.43 kg/m     Wt Readings from Last 3 Encounters:  12/05/17 115 lb (52.2 kg)  11/11/17 116 lb 14.4 oz (53 kg)  10/28/17 115 lb 12.8 oz (52.5 kg)     GEN: Very frail looking underweight in no acute distress HEENT: Normal NECK: No JVD; No carotid bruits LYMPHATICS: No lymphadenopathy CARDIAC: RRR, no murmurs, rubs, gallops RESPIRATORY:  Clear to  auscultation without rales, wheezing or rhonchi  ABDOMEN: Soft, non-tender, non-distended MUSCULOSKELETAL:  No edema; No deformity  SKIN: Warm and dry NEUROLOGIC:  Alert and oriented x 3 PSYCHIATRIC:  Normal affect    Signed, Shirlee More, MD  12/05/2017 11:07 AM    [place]

## 2017-12-05 ENCOUNTER — Ambulatory Visit (INDEPENDENT_AMBULATORY_CARE_PROVIDER_SITE_OTHER): Payer: Medicare Other | Admitting: Cardiology

## 2017-12-05 ENCOUNTER — Encounter: Payer: Self-pay | Admitting: Cardiology

## 2017-12-05 VITALS — BP 118/62 | HR 96 | Ht 64.5 in | Wt 115.0 lb

## 2017-12-05 DIAGNOSIS — I10 Essential (primary) hypertension: Secondary | ICD-10-CM

## 2017-12-05 DIAGNOSIS — I5181 Takotsubo syndrome: Secondary | ICD-10-CM | POA: Diagnosis not present

## 2017-12-05 NOTE — Addendum Note (Signed)
Addended by: Jerl Santos R on: 12/05/2017 01:14 PM   Modules accepted: Orders

## 2017-12-05 NOTE — Patient Instructions (Signed)
Medication Instructions:  Your physician recommends that you continue on your current medications as directed. Please refer to the Current Medication list given to you today.  If you need a refill on your cardiac medications before your next appointment, please call your pharmacy.   Lab work: None  If you have labs (blood work) drawn today and your tests are completely normal, you will receive your results only by: . MyChart Message (if you have MyChart) OR . A paper copy in the mail If you have any lab test that is abnormal or we need to change your treatment, we will call you to review the results.  Testing/Procedures: None  Follow-Up: At [facility], you and your health needs are our priority.  As part of our continuing mission to provide you with exceptional heart care, we have created designated Provider Care Teams.  These Care Teams include your primary Cardiologist (physician) and Advanced Practice Providers (APPs -  Physician Assistants and Nurse Practitioners) who all work together to provide you with the care you need, when you need it. You will need a follow up appointment in 1 years.  Please call our office 2 months in advance to schedule this appointment.   

## 2017-12-08 ENCOUNTER — Ambulatory Visit: Payer: Self-pay

## 2017-12-08 ENCOUNTER — Telehealth: Payer: Self-pay | Admitting: Internal Medicine

## 2017-12-08 NOTE — Telephone Encounter (Signed)
Kelly from [facility] called to report pt's lightheadedness. Pt reports that she feels tired and her head feels "very heavy." Pt denies vertigo. Standing makes her symptoms worse.  Last Friday, pt was leaning over her walker and she bent forward and became lightheaded. Pt stated that she has been lightheaded ever since.  Pt denies ever having lightheadedness before. Pt denies weakness or numbness to the face, arms or legs. Pt's smile is even and no arm drift per [employer] Pt stated that she can still walk around the house and can go to the bathroom by herself. HR 58 BP sitting 122/70 and BP standing 110/68. Care advice given and [facility] Billings and pt's daughter verbalized understanding. Appointment made for tomorrow at 1:40 with Dr Elease Hashimoto.  Reason for Disposition . [1] MODERATE dizziness (e.g., interferes with normal activities) AND [2] has NOT been evaluated by physician for this  (Exception: dizziness caused by heat exposure, sudden standing, or poor fluid intake)  Answer Assessment - Initial Assessment Questions 1. DESCRIPTION: "Describe your dizziness."     Dizziness and very very weak, head is so heavy 2. LIGHTHEADED: "Do you feel lightheaded?" (e.g., somewhat faint, woozy, weak upon standing)     yes 3. VERTIGO: "Do you feel like either you or the room is spinning or tilting?" (i.e. vertigo)     no 4. SEVERITY: "How bad is it?"  "Do you feel like you are going to faint?" "Can you stand and walk?"   - MILD - walking normally   - MODERATE - interferes with normal activities (e.g., work, school)    - SEVERE - unable to stand, requires support to walk, feels like passing out now.      Mild to moderate 5. ONSET:  "When did the dizziness begin?"     Friday 6. AGGRAVATING FACTORS: "Does anything make it worse?" (e.g., standing, change in head position)     Standing,  7. HEART RATE: "Can you tell me your heart rate?" "How many beats in 15 seconds?"  (Note: not all patients can do this)        58 8. CAUSE: "What do you think is causing the dizziness?"     Pt doesn't know 9. RECURRENT SYMPTOM: "Have you had dizziness before?" If so, ask: "When was the last time?" "What happened that time?"     no 10. OTHER SYMPTOMS: "Do you have any other symptoms?" (e.g., fever, chest pain, vomiting, diarrhea, bleeding)       no 11. PREGNANCY: "Is there any chance you are pregnant?" "When was your last menstrual period?"       n/a  Protocols used: DIZZINESS [facility]

## 2017-12-08 NOTE — Telephone Encounter (Unsigned)
Copied from [place] [id number]. Topic: Quick Communication - Home Health Verbal Orders >> Dec 08, 2017 11:23 AM Yvette Rack wrote: Caller/Agency: [facility] / [facility] Number: [phone] Requesting OT/PT/Skilled Nursing/Social Work: PT - Home Health Frequency: requests extension of 1 more visit

## 2017-12-09 ENCOUNTER — Ambulatory Visit (INDEPENDENT_AMBULATORY_CARE_PROVIDER_SITE_OTHER): Payer: Medicare Other | Admitting: Family Medicine

## 2017-12-09 ENCOUNTER — Encounter: Payer: Self-pay | Admitting: Family Medicine

## 2017-12-09 ENCOUNTER — Other Ambulatory Visit: Payer: Medicare Other

## 2017-12-09 ENCOUNTER — Other Ambulatory Visit: Payer: Self-pay

## 2017-12-09 VITALS — BP 108/62 | HR 69 | Temp 97.9°F | Ht 64.0 in | Wt 117.4 lb

## 2017-12-09 DIAGNOSIS — E0590 Thyrotoxicosis, unspecified without thyrotoxic crisis or storm: Secondary | ICD-10-CM | POA: Diagnosis not present

## 2017-12-09 DIAGNOSIS — R42 Dizziness and giddiness: Secondary | ICD-10-CM | POA: Diagnosis not present

## 2017-12-09 DIAGNOSIS — R531 Weakness: Secondary | ICD-10-CM

## 2017-12-09 DIAGNOSIS — I5181 Takotsubo syndrome: Secondary | ICD-10-CM

## 2017-12-09 DIAGNOSIS — D320 Benign neoplasm of cerebral meninges: Secondary | ICD-10-CM

## 2017-12-09 DIAGNOSIS — D729 Disorder of white blood cells, unspecified: Secondary | ICD-10-CM

## 2017-12-09 DIAGNOSIS — I10 Essential (primary) hypertension: Secondary | ICD-10-CM

## 2017-12-09 LAB — POCT URINALYSIS DIPSTICK
Bilirubin, UA: NEGATIVE
Glucose, UA: NEGATIVE
KETONES UA: NEGATIVE
LEUKOCYTES UA: NEGATIVE
NITRITE UA: NEGATIVE
PROTEIN UA: NEGATIVE
RBC UA: NEGATIVE
SPEC GRAV UA: 1.025 (ref 1.010–1.025)
UROBILINOGEN UA: 0.2 U/dL
pH, UA: 6 (ref 5.0–8.0)

## 2017-12-09 LAB — CBC WITH DIFFERENTIAL/PLATELET
BASOS PCT: 0.1 % (ref 0.0–3.0)
Basophils Absolute: 0 10*3/uL (ref 0.0–0.1)
Eosinophils Absolute: 0 10*3/uL (ref 0.0–0.7)
Eosinophils Relative: 0 % (ref 0.0–5.0)
HCT: 38.1 % (ref 36.0–46.0)
HEMOGLOBIN: 12.5 g/dL (ref 12.0–15.0)
Lymphocytes Relative: 5.6 % — ABNORMAL LOW (ref 12.0–46.0)
Lymphs Abs: 1 10*3/uL (ref 0.7–4.0)
MCHC: 32.8 g/dL (ref 30.0–36.0)
MCV: 93.2 fl (ref 78.0–100.0)
MONO ABS: 0.5 10*3/uL (ref 0.1–1.0)
MONOS PCT: 2.9 % — AB (ref 3.0–12.0)
Neutro Abs: 15.8 10*3/uL — ABNORMAL HIGH (ref 1.4–7.7)
Neutrophils Relative %: 91.4 % — ABNORMAL HIGH (ref 43.0–77.0)
Platelets: 230 10*3/uL (ref 150.0–400.0)
RBC: 4.08 Mil/uL (ref 3.87–5.11)
RDW: 15.2 % (ref 11.5–15.5)
WBC: 17.4 10*3/uL — AB (ref 4.0–10.5)

## 2017-12-09 LAB — COMPREHENSIVE METABOLIC PANEL
ALBUMIN: 4 g/dL (ref 3.5–5.2)
ALK PHOS: 64 U/L (ref 39–117)
ALT: 13 U/L (ref 0–35)
AST: 14 U/L (ref 0–37)
BUN: 33 mg/dL — ABNORMAL HIGH (ref 6–23)
CALCIUM: 9 mg/dL (ref 8.4–10.5)
CHLORIDE: 104 meq/L (ref 96–112)
CO2: 24 mEq/L (ref 19–32)
Creatinine, Ser: 0.86 mg/dL (ref 0.40–1.20)
GFR: 65.34 mL/min (ref >60.00)
Glucose, Bld: 162 mg/dL — ABNORMAL HIGH (ref 70–99)
POTASSIUM: 4.1 meq/L (ref 3.5–5.1)
Sodium: 138 mEq/L (ref 135–145)
TOTAL PROTEIN: 6.2 g/dL (ref 6.0–8.3)
Total Bilirubin: 0.4 mg/dL (ref 0.2–1.2)

## 2017-12-09 LAB — MAGNESIUM: MAGNESIUM: 1.9 mg/dL (ref 1.5–2.5)

## 2017-12-09 NOTE — Patient Instructions (Signed)
Follow up for any fever, confusion, or increased weakness.

## 2017-12-09 NOTE — Telephone Encounter (Signed)
Verbal orders given to [facility] via vm.

## 2017-12-09 NOTE — Progress Notes (Signed)
Subjective:     Patient ID: [name], female   DOB: 01-Jul-1922, 84 y.o.   MRN: [id number]  HPI Has chronic problems including history of hypertension, history of Takotsubo syndrome, hyperthyroidism, cerebral meningioma, osteoarthritis, hyperlipidemia, protein calorie malnutrition.  Her recent history is that she was admitted at [facility] couple of weeks ago and was placed apparently back on Decadron for concerns of some cerebral edema.  She has close follow-up with neurosurgery there.  She is currently on tapering dose of Decadron.  She has scheduled follow-up MRI brain 19th of  December.  Past history of posterior parasagittal and subcutaneous atypical meningioma status post craniotomy 2008 followed by [facility] 2010 for marginal recurrence and repeat gamma knife radiation for recurrence 07/24/2017.  Most recent [facility] admission secondary to concerns for decreased visual acuity, decreased balance, headache.    She complains of some nonspecific weakness and dizziness.  Her dizziness apparently is vertigo type.  No syncope.  Dizziness is very transient.  She has occasional headaches but not consistently.  No fevers or chills.  Denies any cough, shortness of breath, chest pain, abdominal pain, dysuria, or any change in stool habits.  Occasional nausea without vomiting.  Daughter thinks she is drinking plenty of fluids and eating fairly well.  She has actually gained some weight since starting the Decadron.  She had thyroid functions done recently in September which were normal.  Past Medical History:  Diagnosis Date  . Abnormal CT of the chest    Stable patchy/nodular and ground-glass opacities in the right upper lobe, While grossly unchanged, low grade adenocarcinoma remains possible.  Marland Kitchen ANEMIA, PERNICIOUS 09/10/2006  . Bradycardia    August, 2013  . CORONARY ARTERY DISEASE 11/07/2008   a. NSTEMI 2001 & STEMI 2008 managed medically. b. Takotsubo 06/2006. c. Cath 2009 -> med rx.  . CVA WITH  LEFT HEMIPARESIS 07/22/2007   May, 2009, neurology decided aspirin and Plavix at that time.  . DERMATITIS 03/02/2007  . DJD (degenerative joint disease)    L shoulder impingement  . Ejection fraction    EF 60%, echo, July, 2010, (  EF improved after tach a suitable event 2008)  . Family history of anesthesia complication    daughter has a hard time waking up  . H/O hiatal hernia   . HYPERTENSION 07/15/2006  . IBS (irritable bowel syndrome)   . MENINGIOMA 08/27/2006   Occipital meningioma, surgery, [facility], 2008 /  MRI, [place], June, 2011, no change in lesion, history believe the patient had a gamma knife treatment of a right occipital atypical meningioma  . OSTEOARTHRITIS 07/15/2006  . PULMONARY NODULE 01/14/2007   [place], December, 2011  . Stroke ([place])   . Subarachnoid hemorrhage following injury ([place]) 2007   Subarachnoid hemorrhage secondary to a fall December, 2007  . Takotsubo syndrome 04/22/2008   MI, May, 2008, question takotsubo event  . THYROID NODULE 01/14/2007   Past Surgical History:  Procedure Laterality Date  . ABDOMINAL HYSTERECTOMY    . BRAIN TUMOR EXCISION     Meningioma; treated at [facility] in 2008  . CHOLECYSTECTOMY    . JOINT REPLACEMENT     rt THR  . SHOULDER SURGERY      reports that she has never smoked. She has never used smokeless tobacco. She reports that she does not drink alcohol or use drugs. family history includes Diabetes in her brother; Heart attack (age of onset: 31) in her brother; Stroke in her brother and sister. Allergies  Allergen  Reactions  . Atorvastatin Other (See Comments)    Raises liver enzymes (takes pravastatin at home)  . Fluorometholone     Falling down  . Influenza Vaccines Other (See Comments)    "dizziness"  . Phenytoin Other (See Comments)    Blood clots  . Promethazine Hcl Other (See Comments)    Severe sedation  . Sulfamethoxazole     REACTION: unspecified  Per pt daughter she stated this causes rash     Review of  Systems  Constitutional: Positive for fatigue. Negative for chills and fever.  HENT: Negative for sore throat.   Respiratory: Negative for cough and shortness of breath.   Cardiovascular: Negative for chest pain.  Genitourinary: Negative for dysuria.  Neurological: Positive for dizziness and weakness. Negative for seizures, syncope, speech difficulty and headaches.       Objective:   Physical Exam  Constitutional:  Alert somewhat frail appearing 84 year old female in no distress  HENT:  Mouth/Throat: Oropharynx is clear and moist.  Neck: Neck supple.  Cardiovascular: Normal rate and regular rhythm.  Pulmonary/Chest: Effort normal and breath sounds normal.  Musculoskeletal: She exhibits no edema.  Neurological: She is alert. No cranial nerve deficit.  Skin: No rash noted.       Assessment:     Patient with multiple medical problems as above including history of cerebral meningioma who presents with nonspecific complaints of dizziness and weakness.  Her dizziness sounds more vertigo related.  Nonfocal neuro exam at this time.  Vital signs are stable.    Plan:     -Check further labs with comprehensive metabolic panel, CBC, magnesium level, urinalysis -Continue close follow-up at [facility] regarding her meningiomas -continue Decadron taper -follow up immediately for any fever, confusion, or other concerns.  Eulas Post MD [facility]

## 2017-12-10 ENCOUNTER — Ambulatory Visit (INDEPENDENT_AMBULATORY_CARE_PROVIDER_SITE_OTHER): Payer: Medicare Other

## 2017-12-10 ENCOUNTER — Other Ambulatory Visit: Payer: Self-pay

## 2017-12-10 DIAGNOSIS — D72829 Elevated white blood cell count, unspecified: Secondary | ICD-10-CM

## 2017-12-10 DIAGNOSIS — J449 Chronic obstructive pulmonary disease, unspecified: Secondary | ICD-10-CM | POA: Diagnosis not present

## 2017-12-10 LAB — PATHOLOGIST SMEAR REVIEW

## 2017-12-13 ENCOUNTER — Emergency Department (HOSPITAL_COMMUNITY): Payer: Medicare Other

## 2017-12-13 ENCOUNTER — Observation Stay (HOSPITAL_COMMUNITY)
Admission: EM | Admit: 2017-12-13 | Discharge: 2017-12-16 | Disposition: A | Discharge status: Home / Self Care | Payer: Medicare Other | Attending: Internal Medicine | Admitting: Internal Medicine

## 2017-12-13 ENCOUNTER — Other Ambulatory Visit: Payer: Self-pay

## 2017-12-13 ENCOUNTER — Encounter (HOSPITAL_COMMUNITY): Payer: Self-pay | Admitting: Emergency Medicine

## 2017-12-13 DIAGNOSIS — W19XXXA Unspecified fall, initial encounter: Secondary | ICD-10-CM | POA: Diagnosis present

## 2017-12-13 DIAGNOSIS — S32401A Unspecified fracture of right acetabulum, initial encounter for closed fracture: Principal | ICD-10-CM | POA: Insufficient documentation

## 2017-12-13 DIAGNOSIS — S32591A Other specified fracture of right pubis, initial encounter for closed fracture: Secondary | ICD-10-CM | POA: Insufficient documentation

## 2017-12-13 DIAGNOSIS — Z8673 Personal history of transient ischemic attack (TIA), and cerebral infarction without residual deficits: Secondary | ICD-10-CM | POA: Insufficient documentation

## 2017-12-13 DIAGNOSIS — S32413A Displaced fracture of anterior wall of unspecified acetabulum, initial encounter for closed fracture: Secondary | ICD-10-CM | POA: Diagnosis not present

## 2017-12-13 DIAGNOSIS — Z86018 Personal history of other benign neoplasm: Secondary | ICD-10-CM

## 2017-12-13 DIAGNOSIS — K219 Gastro-esophageal reflux disease without esophagitis: Secondary | ICD-10-CM | POA: Diagnosis present

## 2017-12-13 DIAGNOSIS — D72829 Elevated white blood cell count, unspecified: Secondary | ICD-10-CM | POA: Insufficient documentation

## 2017-12-13 DIAGNOSIS — S32411A Displaced fracture of anterior wall of right acetabulum, initial encounter for closed fracture: Secondary | ICD-10-CM | POA: Diagnosis not present

## 2017-12-13 DIAGNOSIS — S32501A Unspecified fracture of right pubis, initial encounter for closed fracture: Secondary | ICD-10-CM | POA: Diagnosis not present

## 2017-12-13 DIAGNOSIS — M1990 Unspecified osteoarthritis, unspecified site: Secondary | ICD-10-CM | POA: Diagnosis present

## 2017-12-13 DIAGNOSIS — E785 Hyperlipidemia, unspecified: Secondary | ICD-10-CM | POA: Insufficient documentation

## 2017-12-13 DIAGNOSIS — R296 Repeated falls: Secondary | ICD-10-CM | POA: Insufficient documentation

## 2017-12-13 DIAGNOSIS — R001 Bradycardia, unspecified: Secondary | ICD-10-CM | POA: Diagnosis not present

## 2017-12-13 DIAGNOSIS — M8580 Other specified disorders of bone density and structure, unspecified site: Secondary | ICD-10-CM | POA: Diagnosis not present

## 2017-12-13 DIAGNOSIS — I5181 Takotsubo syndrome: Secondary | ICD-10-CM | POA: Insufficient documentation

## 2017-12-13 DIAGNOSIS — Z7902 Long term (current) use of antithrombotics/antiplatelets: Secondary | ICD-10-CM | POA: Diagnosis not present

## 2017-12-13 DIAGNOSIS — W06XXXA Fall from bed, initial encounter: Secondary | ICD-10-CM | POA: Insufficient documentation

## 2017-12-13 DIAGNOSIS — K5730 Diverticulosis of large intestine without perforation or abscess without bleeding: Secondary | ICD-10-CM | POA: Insufficient documentation

## 2017-12-13 DIAGNOSIS — J449 Chronic obstructive pulmonary disease, unspecified: Secondary | ICD-10-CM | POA: Insufficient documentation

## 2017-12-13 DIAGNOSIS — S32509A Unspecified fracture of unspecified pubis, initial encounter for closed fracture: Secondary | ICD-10-CM | POA: Diagnosis not present

## 2017-12-13 DIAGNOSIS — R0682 Tachypnea, not elsewhere classified: Secondary | ICD-10-CM

## 2017-12-13 DIAGNOSIS — Z882 Allergy status to sulfonamides status: Secondary | ICD-10-CM | POA: Insufficient documentation

## 2017-12-13 DIAGNOSIS — I252 Old myocardial infarction: Secondary | ICD-10-CM | POA: Insufficient documentation

## 2017-12-13 DIAGNOSIS — I700 Atherosclerosis of aorta: Secondary | ICD-10-CM | POA: Diagnosis not present

## 2017-12-13 DIAGNOSIS — S32434A Nondisplaced fracture of anterior column [iliopubic] of right acetabulum, initial encounter for closed fracture: Secondary | ICD-10-CM

## 2017-12-13 DIAGNOSIS — K589 Irritable bowel syndrome without diarrhea: Secondary | ICD-10-CM | POA: Insufficient documentation

## 2017-12-13 DIAGNOSIS — Z96641 Presence of right artificial hip joint: Secondary | ICD-10-CM | POA: Diagnosis not present

## 2017-12-13 DIAGNOSIS — E559 Vitamin D deficiency, unspecified: Secondary | ICD-10-CM | POA: Diagnosis not present

## 2017-12-13 DIAGNOSIS — I10 Essential (primary) hypertension: Secondary | ICD-10-CM | POA: Diagnosis not present

## 2017-12-13 DIAGNOSIS — D649 Anemia, unspecified: Secondary | ICD-10-CM | POA: Insufficient documentation

## 2017-12-13 DIAGNOSIS — E0590 Thyrotoxicosis, unspecified without thyrotoxic crisis or storm: Secondary | ICD-10-CM | POA: Diagnosis present

## 2017-12-13 DIAGNOSIS — Y92003 Bedroom of unspecified non-institutional (private) residence as the place of occurrence of the external cause: Secondary | ICD-10-CM | POA: Diagnosis not present

## 2017-12-13 DIAGNOSIS — R52 Pain, unspecified: Secondary | ICD-10-CM | POA: Diagnosis not present

## 2017-12-13 DIAGNOSIS — M6281 Muscle weakness (generalized): Secondary | ICD-10-CM | POA: Diagnosis not present

## 2017-12-13 DIAGNOSIS — Z79899 Other long term (current) drug therapy: Secondary | ICD-10-CM | POA: Insufficient documentation

## 2017-12-13 DIAGNOSIS — I2510 Atherosclerotic heart disease of native coronary artery without angina pectoris: Secondary | ICD-10-CM | POA: Diagnosis not present

## 2017-12-13 DIAGNOSIS — S329XXA Fracture of unspecified parts of lumbosacral spine and pelvis, initial encounter for closed fracture: Secondary | ICD-10-CM | POA: Diagnosis present

## 2017-12-13 DIAGNOSIS — Z66 Do not resuscitate: Secondary | ICD-10-CM | POA: Insufficient documentation

## 2017-12-13 HISTORY — DX: Fracture of unspecified parts of lumbosacral spine and pelvis, initial encounter for closed fracture: S32.9XXA

## 2017-12-13 LAB — BASIC METABOLIC PANEL
Anion gap: 8 (ref 5–15)
BUN: 38 mg/dL — ABNORMAL HIGH (ref 8–23)
CALCIUM: 8.5 mg/dL — AB (ref 8.9–10.3)
CO2: 21 mmol/L — AB (ref 22–32)
CREATININE: 0.84 mg/dL (ref 0.44–1.00)
Chloride: 108 mmol/L (ref 98–111)
GFR calc non Af Amer: 58 mL/min — ABNORMAL LOW (ref >60)
Glucose, Bld: 131 mg/dL — ABNORMAL HIGH (ref 70–99)
Potassium: 4 mmol/L (ref 3.5–5.1)
SODIUM: 137 mmol/L (ref 135–145)

## 2017-12-13 LAB — CBC WITH DIFFERENTIAL/PLATELET
Abs Immature Granulocytes: 0.31 10*3/uL — ABNORMAL HIGH (ref 0.00–0.07)
BASOS ABS: 0 10*3/uL (ref 0.0–0.1)
Basophils Relative: 0 %
EOS PCT: 1 %
Eosinophils Absolute: 0.2 10*3/uL (ref 0.0–0.5)
HEMATOCRIT: 35.9 % — AB (ref 36.0–46.0)
HEMOGLOBIN: 11.2 g/dL — AB (ref 12.0–15.0)
Immature Granulocytes: 2 %
LYMPHS ABS: 1.8 10*3/uL (ref 0.7–4.0)
Lymphocytes Relative: 10 %
MCH: 30.3 pg (ref 26.0–34.0)
MCHC: 31.2 g/dL (ref 30.0–36.0)
MCV: 97 fL (ref 80.0–100.0)
MONO ABS: 1.3 10*3/uL — AB (ref 0.1–1.0)
Monocytes Relative: 8 %
NRBC: 0 % (ref 0.0–0.2)
Neutro Abs: 13.6 10*3/uL — ABNORMAL HIGH (ref 1.7–7.7)
Neutrophils Relative %: 79 %
Platelets: 184 10*3/uL (ref 150–400)
RBC: 3.7 MIL/uL — ABNORMAL LOW (ref 3.87–5.11)
RDW: 14.9 % (ref 11.5–15.5)
WBC: 17.3 10*3/uL — ABNORMAL HIGH (ref 4.0–10.5)

## 2017-12-13 LAB — URINALYSIS, ROUTINE W REFLEX MICROSCOPIC
Bacteria, UA: NONE SEEN
Bilirubin Urine: NEGATIVE
Glucose, UA: NEGATIVE mg/dL
Hgb urine dipstick: NEGATIVE
Ketones, ur: NEGATIVE mg/dL
Nitrite: NEGATIVE
Protein, ur: NEGATIVE mg/dL
Specific Gravity, Urine: 1.014 (ref 1.005–1.030)
pH: 5 (ref 5.0–8.0)

## 2017-12-13 MED ORDER — DEXAMETHASONE 0.5 MG PO TABS
1.0000 mg | ORAL_TABLET | Freq: Every day | ORAL | Status: DC
  Administered 2017-12-14: 1 mg via ORAL
  Filled 2017-12-13 (×2): qty 2

## 2017-12-13 MED ORDER — CLOPIDOGREL BISULFATE 75 MG PO TABS
75.0000 mg | ORAL_TABLET | Freq: Every day | ORAL | Status: DC
  Administered 2017-12-14 – 2017-12-16 (×3): 75 mg via ORAL
  Filled 2017-12-13 (×3): qty 1

## 2017-12-13 MED ORDER — FLEET ENEMA 7-19 GM/118ML RE ENEM: 1.0000 | ENEMA | Freq: Once | RECTAL | Status: DC | PRN

## 2017-12-13 MED ORDER — SODIUM CHLORIDE 0.9% FLUSH
3.0000 mL | Freq: Two times a day (BID) | INTRAVENOUS | Status: DC
  Administered 2017-12-13 – 2017-12-16 (×5): 3 mL via INTRAVENOUS

## 2017-12-13 MED ORDER — HYDROCODONE-ACETAMINOPHEN 5-325 MG PO TABS
1.0000 | ORAL_TABLET | Freq: Four times a day (QID) | ORAL | Status: DC | PRN
  Administered 2017-12-13 (×2): 2 via ORAL
  Administered 2017-12-14: 1 via ORAL
  Administered 2017-12-14: 2 via ORAL
  Filled 2017-12-13 (×5): qty 2

## 2017-12-13 MED ORDER — NITROGLYCERIN 0.4 MG SL SUBL: 0.4000 mg | SUBLINGUAL_TABLET | SUBLINGUAL | Status: DC | PRN

## 2017-12-13 MED ORDER — ENOXAPARIN SODIUM 40 MG/0.4ML ~~LOC~~ SOLN
40.0000 mg | SUBCUTANEOUS | Status: DC
  Filled 2017-12-13 (×2): qty 0.4

## 2017-12-13 MED ORDER — POLYETHYLENE GLYCOL 3350 17 G PO PACK: 17.0000 g | PACK | Freq: Every day | ORAL | Status: DC | PRN

## 2017-12-13 MED ORDER — ISOSORBIDE MONONITRATE ER 30 MG PO TB24
15.0000 mg | ORAL_TABLET | Freq: Every day | ORAL | Status: DC
Start: 2017-12-14 — End: 2017-12-15
  Administered 2017-12-14 – 2017-12-15 (×2): 15 mg via ORAL
  Filled 2017-12-13 (×2): qty 1

## 2017-12-13 MED ORDER — METHOCARBAMOL 1000 MG/10ML IJ SOLN
500.0000 mg | Freq: Four times a day (QID) | INTRAVENOUS | Status: DC | PRN
  Filled 2017-12-13: qty 5

## 2017-12-13 MED ORDER — OXYCODONE HCL 5 MG PO TABS
5.0000 mg | ORAL_TABLET | ORAL | Status: DC | PRN
  Administered 2017-12-14 – 2017-12-15 (×2): 5 mg via ORAL
  Filled 2017-12-13 (×3): qty 1

## 2017-12-13 MED ORDER — METHOCARBAMOL 500 MG PO TABS
500.0000 mg | ORAL_TABLET | Freq: Four times a day (QID) | ORAL | Status: DC | PRN
  Administered 2017-12-13 – 2017-12-14 (×3): 500 mg via ORAL
  Filled 2017-12-13 (×5): qty 1

## 2017-12-13 MED ORDER — DOCUSATE SODIUM 100 MG PO CAPS
100.0000 mg | ORAL_CAPSULE | Freq: Two times a day (BID) | ORAL | Status: DC
  Administered 2017-12-13 – 2017-12-16 (×5): 100 mg via ORAL
  Filled 2017-12-13 (×5): qty 1

## 2017-12-13 MED ORDER — PRAVASTATIN SODIUM 20 MG PO TABS
20.0000 mg | ORAL_TABLET | Freq: Every evening | ORAL | Status: DC
  Administered 2017-12-13 – 2017-12-15 (×3): 20 mg via ORAL
  Filled 2017-12-13 (×3): qty 1

## 2017-12-13 MED ORDER — MORPHINE SULFATE (PF) 2 MG/ML IV SOLN: 0.5000 mg | INTRAVENOUS | Status: DC | PRN

## 2017-12-13 MED ORDER — MELATONIN 1 MG PO TABS
1.0000 mg | ORAL_TABLET | Freq: Every evening | ORAL | Status: DC | PRN
  Filled 2017-12-13: qty 1

## 2017-12-13 MED ORDER — CYCLOSPORINE 0.05 % OP EMUL: 1.0000 [drp] | Freq: Every evening | OPHTHALMIC | Status: DC | PRN

## 2017-12-13 MED ORDER — ASPIRIN EC 325 MG PO TBEC
325.0000 mg | DELAYED_RELEASE_TABLET | Freq: Every day | ORAL | Status: DC
  Administered 2017-12-14 – 2017-12-16 (×3): 325 mg via ORAL
  Filled 2017-12-13 (×3): qty 1

## 2017-12-13 MED ORDER — BISACODYL 5 MG PO TBEC
5.0000 mg | DELAYED_RELEASE_TABLET | Freq: Every day | ORAL | Status: DC | PRN
  Administered 2017-12-15 – 2017-12-16 (×2): 5 mg via ORAL
  Filled 2017-12-13 (×2): qty 1

## 2017-12-13 MED ORDER — MORPHINE SULFATE (PF) 2 MG/ML IV SOLN: 2.0000 mg | INTRAVENOUS | Status: DC | PRN

## 2017-12-13 MED ORDER — ACETAMINOPHEN 325 MG PO TABS
650.0000 mg | ORAL_TABLET | Freq: Four times a day (QID) | ORAL | Status: DC | PRN
  Administered 2017-12-14: 650 mg via ORAL
  Filled 2017-12-13: qty 2

## 2017-12-13 MED ORDER — FENTANYL CITRATE (PF) 100 MCG/2ML IJ SOLN
100.0000 ug | Freq: Once | INTRAMUSCULAR | Status: AC
  Administered 2017-12-13: 100 ug via INTRAVENOUS
  Filled 2017-12-13: qty 2

## 2017-12-13 MED ORDER — METHIMAZOLE 5 MG PO TABS
5.0000 mg | ORAL_TABLET | ORAL | Status: DC
  Filled 2017-12-13: qty 1

## 2017-12-13 NOTE — Plan of Care (Signed)
  Problem: Activity: Goal: Risk for activity intolerance will decrease Outcome: Progressing   Problem: Pain Managment: Goal: General experience of comfort will improve Outcome: Progressing   Problem: Safety: Goal: Ability to remain free from injury will improve Outcome: Progressing   

## 2017-12-13 NOTE — ED Notes (Signed)
ED Provider at bedside.[place]

## 2017-12-13 NOTE — Consult Note (Signed)
Reason for Consult:Right hip pain. Referring Physician: Jovita Kussmaul, MD Consulting Physician:James Louanne Skye, MD  Orthopedic Diagnosis: Right superior and inferior pubic ramus fracture.  [name] is an 84 y.o. female.History of previous right total hip replacement by Dr. Lorin Mercy in 1998. She fell 18 months ago and sustained a left distal radius fracture treated by Dr. Annie Main. Fell In 10/2016 with contusion of the right greater trochanter. Last seen by Dr. Lorin Mercy in 2018 following the fall a year ago. Last eveing daughter notes that she was putting on her coat and she lost her balance. Normally uses a walker at home and is independent in walking. She fell. Family was able to get her to bed however pain is persisting with any attempts at standing and walking or weight bearing on the right leg.     Past Medical History:  Diagnosis Date  . Abnormal CT of the chest    Stable patchy/nodular and ground-glass opacities in the right upper lobe, While grossly unchanged, low grade adenocarcinoma remains possible.  Marland Kitchen ANEMIA, PERNICIOUS 09/10/2006  . Bradycardia    August, 2013  . CORONARY ARTERY DISEASE 11/07/2008   a. NSTEMI 2001 & STEMI 2008 managed medically. b. Takotsubo 06/2006. c. Cath 2009 -> med rx.  . CVA WITH LEFT HEMIPARESIS 07/22/2007   May, 2009, neurology decided aspirin and Plavix at that time.  . DERMATITIS 03/02/2007  . DJD (degenerative joint disease)    L shoulder impingement  . Ejection fraction    EF 60%, echo, July, 2010, (  EF improved after tach a suitable event 2008)  . Family history of anesthesia complication    daughter has a hard time waking up  . H/O hiatal hernia   . HYPERTENSION 07/15/2006  . IBS (irritable bowel syndrome)   . MENINGIOMA 08/27/2006   Occipital meningioma, surgery, [facility], 2008 /  MRI, [place], June, 2011, no change in lesion, history believe the patient had a gamma knife treatment of a right occipital atypical meningioma  . OSTEOARTHRITIS  07/15/2006  . PULMONARY NODULE 01/14/2007   [place], December, 2011  . Stroke ([place])   . Subarachnoid hemorrhage following injury ([place]) 2007   Subarachnoid hemorrhage secondary to a fall December, 2007  . Takotsubo syndrome 04/22/2008   MI, May, 2008, question takotsubo event  . THYROID NODULE 01/14/2007    Past Surgical History:  Procedure Laterality Date  . ABDOMINAL HYSTERECTOMY    . BRAIN TUMOR EXCISION     Meningioma; [facility] at [facility] in [facility]  . CHOLECYSTECTOMY    . JOINT REPLACEMENT     rt THR  . SHOULDER SURGERY      Family History  Problem Relation Age of Onset  . Heart attack Brother 56  . Stroke Brother   . Diabetes Brother   . Stroke Sister     Social History:  reports that she has never smoked. She has never used smokeless tobacco. She reports that she does not drink alcohol or use drugs.  Allergies:  Allergies  Allergen Reactions  . Atorvastatin Other (See Comments)    Raises liver enzymes (takes pravastatin at home)  . Fluorometholone     Falling down  . Influenza Vaccines Other (See Comments)    "dizziness"  . Phenytoin Other (See Comments)    Blood clots  . Promethazine Hcl Other (See Comments)    Severe sedation  . Sulfamethoxazole     REACTION: unspecified  Per pt daughter she stated this causes rash    Medications:  I have reviewed the patient's current medications. Prior to Admission:  Medications Prior to Admission  Medication Sig Dispense Refill Last Dose  . acetaminophen (TYLENOL) 325 MG tablet Take 650 mg by mouth every 6 (six) hours as needed for mild pain or moderate pain.    12/13/2017 at Unknown time  . clopidogrel (PLAVIX) 75 MG tablet TAKE 1 TABLET DAILY WITH BREAKFAST 90 tablet 1 12/13/2017 at 0600  . cyanocobalamin (,VITAMIN B-12,) 1000 MCG/ML injection INJECT 1 ML (1000 MCG TOTAL) INTRAMUSCULARLY EVERY 30 DAYS (Patient taking differently: Inject 1,000 mcg into the muscle every 30 (thirty) days. ) 3 mL 3 12/07/2017  . cycloSPORINE  (RESTASIS) 0.05 % ophthalmic emulsion Place 1 drop into both eyes at bedtime as needed (dry eyes).    12/12/2017 at Unknown time  . dexamethasone (DECADRON) 2 MG tablet Take 1 mg by mouth daily.   0 12/13/2017 at Unknown time  . isosorbide mononitrate (IMDUR) 30 MG 24 hr tablet Take 1 tablet (30 mg total) by mouth daily. (Patient taking differently: Take 15 mg by mouth daily. ) 30 tablet 6 12/13/2017 at 0600  . Melatonin 1 MG TABS Take 1 mg by mouth at bedtime as needed (for sleep).   12/12/2017 at Unknown time  . methimazole (TAPAZOLE) 5 MG tablet TAKE 1 TABLET (5 MG TOTALLY) THREE TIMES A WEEK, MON, WED, AND FRI 45 tablet 4 12/12/2017 at Unknown time  . nitroGLYCERIN (NITROSTAT) 0.4 MG SL tablet Place 1 tablet (0.4 mg total) under the tongue every 5 (five) minutes as needed. For chest pain. 15 tablet 3 unk at prn  . pravastatin (PRAVACHOL) 20 MG tablet TAKE 1 TABLET DAILY IN THE EVENING 90 tablet 1 12/12/2017 at Unknown time  . PREMARIN vaginal cream USE 0.5 GRAM VAGINALLY TWICE A WEEK AS NEEDED (Patient taking differently: Place 1 Applicatorful vaginally 2 (two) times a week. As needed) 90 g 1 unk at prn  . feeding supplement, ENSURE ENLIVE, (ENSURE ENLIVE) LIQD Take 237 mLs by mouth 2 (two) times daily between meals. (Patient not taking: Reported on 12/13/2017) 237 mL 12 Not Taking at Unknown time  . NEEDLE, DISP, 25 G (BD DISP NEEDLES) 25G X 5/8" MISC USE ONCE A MONTH FOR B12 INJECTIONS AS DIRECTED 3 each 4 Taking  . polyethylene glycol (MIRALAX / GLYCOLAX) packet Take 17 g by mouth daily as needed for mild constipation. (Patient not taking: Reported on 12/13/2017) 14 each 0 Not Taking at Unknown time  . Syringe, Disposable, (B-D SYRINGE LUER-LOK 3CC) 3 ML MISC USE AS DIRECTED ONCE A MONTH FOR B12 INJECTIONS 3 each 5 Taking   Scheduled: . [START ON 12/14/2017] clopidogrel  75 mg Oral Q breakfast  . [START ON 12/14/2017] dexamethasone  1 mg Oral Daily  . enoxaparin (LOVENOX) injection  40 mg Subcutaneous  Q24H  . [START ON 12/14/2017] isosorbide mononitrate  15 mg Oral Daily  . [START ON 12/15/2017] methimazole  5 mg Oral Q M,W,F  . pravastatin  20 mg Oral QPM  . sodium chloride flush  3 mL Intravenous Q12H   Continuous:  JOA:[id number], cycloSPORINE, Melatonin, morphine injection, nitroGLYCERIN, oxyCODONE Anti-infectives (From admission, onward)   None      Results for orders placed or performed during the hospital encounter of 12/13/17 (from the past 48 hour(s))  Basic metabolic panel     Status: Abnormal   Collection Time: 12/13/17  8:01 AM  Result Value Ref Range   Sodium 137 135 - 145 mmol/L   Potassium 4.0 3.5 -  5.1 mmol/L   Chloride 108 98 - 111 mmol/L   CO2 21 (L) 22 - 32 mmol/L   Glucose, Bld 131 (H) 70 - 99 mg/dL   BUN 38 (H) 8 - 23 mg/dL   Creatinine, Ser 0.84 0.44 - 1.00 mg/dL   Calcium 8.5 (L) 8.9 - 10.3 mg/dL   GFR calc non Af Amer 58 (L) >60 mL/min   GFR calc Af Amer >60 >60 mL/min    Comment: (NOTE) The eGFR has been calculated using the CKD EPI equation. This calculation has not been validated in all clinical situations. eGFR's persistently <60 mL/min signify possible Chronic Kidney Disease.    Anion gap 8 5 - 15    Comment: Performed at [facility] [address] [place], [place] [address]  CBC with Differential/Platelet     Status: Abnormal   Collection Time: 12/13/17  8:01 AM  Result Value Ref Range   WBC 17.3 (H) 4.0 - 10.5 K/uL   RBC 3.70 (L) 3.87 - 5.11 MIL/uL   Hemoglobin 11.2 (L) 12.0 - 15.0 g/dL   HCT 35.9 (L) 36.0 - 46.0 %   MCV 97.0 80.0 - 100.0 fL   MCH 30.3 26.0 - 34.0 pg   MCHC 31.2 30.0 - 36.0 g/dL   RDW 14.9 11.5 - 15.5 %   Platelets 184 150 - 400 K/uL   nRBC 0.0 0.0 - 0.2 %   Neutrophils Relative % 79 %   Neutro Abs 13.6 (H) 1.7 - 7.7 K/uL   Lymphocytes Relative 10 %   Lymphs Abs 1.8 0.7 - 4.0 K/uL   Monocytes Relative 8 %   Monocytes Absolute 1.3 (H) 0.1 - 1.0 K/uL   Eosinophils Relative 1 %   Eosinophils Absolute  0.2 0.0 - 0.5 K/uL   Basophils Relative 0 %   Basophils Absolute 0.0 0.0 - 0.1 K/uL   Immature Granulocytes 2 %   Abs Immature Granulocytes 0.31 (H) 0.00 - 0.07 K/uL    Comment: Performed at [facility], [address], [place], [place] [address]    Ct Pelvis Wo Contrast  Result Date: 12/13/2017 CLINICAL DATA:  Fall at 1 a.m.  Right leg and hip pain. EXAM: CT PELVIS WITHOUT CONTRAST TECHNIQUE: Multidetector CT imaging of the pelvis was performed following the standard protocol without intravenous contrast. COMPARISON:  Pelvic CT from 09/13/2011 and pelvic radiographs 12/13/2017 FINDINGS: Urinary Tract:  Unremarkable Bowel:  Sigmoid colon diverticulosis. Vascular/Lymphatic: Aortoiliac atherosclerotic vascular disease. Reproductive:  Uterus absent.  Adnexa unremarkable. Other:  No supplemental non-categorized findings. Musculoskeletal: Displaced fracture of the right inferior pubic ramus, 9 mm displacement observed. There is fracture of the right acetabulum involving the quadrilateral plate on image 61/6, and also extending into the anterior wall in the vicinity of a lucency adjacent to the hemiarthroplasty which may be from poly ethylene where. Chronic avulsion of the distal right piriformis muscle with calcification along its distal margin, and asymmetric atrophy of the muscle belly. No diastasis of the pubis or sacroiliac joints. I do not observe a well-defined sacral fracture, if a sacral fractures present it is occult by CT. Bony demineralization is present. Right hip hemiarthroplasty common no proximal femoral fracture but the distal stem of the arthroplasty is not fully included. Moderate to severe degenerative chondral thinning in the left hip joint with marginal spurring. No appreciable fracture at L4 or L5. Chronic atrophy of the gluteus medius and minimus muscles, left greater than right. IMPRESSION: 1. Fracture of the anterior  wall of the right acetabulum through a lucency which may be  from polyethylene particulate disease. This fracture also involves the quadrilateral plate of the right acetabulum, and there is a transverse mildly displaced fracture of the right inferior pubic ramus. 2. Chronic avulsion of the distal right piriformis muscle, with calcification along the distal tendon margin and asymmetric atrophy of the muscle belly. 3. Diffuse bony demineralization. 4. Moderate to severe degenerative arthropathy of the left hip. 5.  Aortic Atherosclerosis (ICD10-I70.0). 6. Sigmoid colon diverticulosis. Electronically Signed   By: Van Clines M.D.   On: 12/13/2017 10:29   Dg Hip Unilat With Pelvis 2-3 Views Right  Result Date: 12/13/2017 CLINICAL DATA:  Fall this morning with right hip pain. EXAM: DG HIP (WITH OR WITHOUT PELVIS) 2-3V RIGHT COMPARISON:  05/07/2016 FINDINGS: There is moderate diffuse osteopenia. Right total hip arthroplasty is intact and normally located. There is a right inferior pubic ramus fracture which appears acute with minimal displacement. Mild degenerate change of the left hip and spine. IMPRESSION: Right inferior pubic ramus fracture with minimal displacement. Right total hip arthroplasty intact and normally located. Electronically Signed   By: Marin Olp M.D.   On: 12/13/2017 09:12    ROS Blood pressure (!) 146/83, pulse 72, temperature 97.9 F (36.6 C), temperature source Oral, resp. rate 16, height '5\' 4"'  (1.626 m), weight 53.1 kg, SpO2 100 %. Physical Exam  Orthopaedic Exam: The right leg is equal in length to the left Pain with any ROM of the right hip.  PT pulses are normal. Motor is intact but pain prevents right hip movement.Tender right anterior pelvis and pubis.  Plain radiographs show well seated right acetabular total hip implant and well seated right femoral component. No lucencies about the right hip implants.Right inferior pubic ramus fracture. CT Scan of right hip with right inferior pubic ramus fracture and a transverse nondisplace  right anterior column acetabular fracture. Bone is osteopenic and suggestive of possible osteolysis due to chronic poly wear.   Assessment/Plan: Right pubic ramus fracture and nondisplaced right acetabular fracture. The hip implants appear seated and there is no acute loosening so conservative management with bed rest and ambulation with walker partial weight bearing on the lef for 3-4 weeks. Short term SNF placement.  Pain control and therapy to mobilize.  Anticoagulation as necessary.  Basil Dess 12/13/2017, 1:44 PM

## 2017-12-13 NOTE — ED Provider Notes (Signed)
[facility] EMERGENCY DEPARTMENT Provider Note   CSN: [id number] Arrival date & time: 12/13/17  7322     History   Chief Complaint Chief Complaint  Patient presents with  . Fall    HPI [name] is a 84 y.o. female.  History from patient and from EMS.  Patient fell beside her bed 1 AM today.  She was unable to get off the floor.  Family called EMS this morning.  She complains of right hip pain.  Denies other complaint.  She was feeling well last night.  Pain is worse with movement and improved with remaining still.  She did not lose consciousness.  She denies headache denies neck pain denies other complaint.  No treatment prior to coming here except EMS placed a towel around her neck per protocol  HPI  Past Medical History:  Diagnosis Date  . Abnormal CT of the chest    Stable patchy/nodular and ground-glass opacities in the right upper lobe, While grossly unchanged, low grade adenocarcinoma remains possible.  Marland Kitchen ANEMIA, PERNICIOUS 09/10/2006  . Bradycardia    August, 2013  . CORONARY ARTERY DISEASE 11/07/2008   a. NSTEMI 2001 & STEMI 2008 managed medically. b. Takotsubo 06/2006. c. Cath 2009 -> med rx.  . CVA WITH LEFT HEMIPARESIS 07/22/2007   May, 2009, neurology decided aspirin and Plavix at that time.  . DERMATITIS 03/02/2007  . DJD (degenerative joint disease)    L shoulder impingement  . Ejection fraction    EF 60%, echo, July, 2010, (  EF improved after tach a suitable event 2008)  . Family history of anesthesia complication    daughter has a hard time waking up  . H/O hiatal hernia   . HYPERTENSION 07/15/2006  . IBS (irritable bowel syndrome)   . MENINGIOMA 08/27/2006   Occipital meningioma, surgery, [facility], 2008 /  MRI, [facility], June, 2011, no change in lesion, history believe the patient had a gamma knife treatment of a right occipital atypical meningioma  . OSTEOARTHRITIS 07/15/2006  . PULMONARY NODULE 01/14/2007   [place], December, 2011  .  Stroke ([place])   . Subarachnoid hemorrhage following injury ([place]) 2007   Subarachnoid hemorrhage secondary to a fall December, 2007  . Takotsubo syndrome 04/22/2008   MI, May, 2008, question takotsubo event  . THYROID NODULE 01/14/2007    Patient Active Problem List   Diagnosis Date Noted  . Protein-calorie malnutrition, severe 06/13/2017  . Fall 06/12/2017  . Headache 09/25/2016  . History of stroke 11/15/2014  . History of meningioma 11/15/2014  . GERD (gastroesophageal reflux disease) 11/19/2011  . Dysphagia 11/18/2011  . Palpitations 11/17/2011  . Encounter for long-term (current) use of other medications 10/29/2011  . Hyperthyroidism 10/14/2011  . Vertigo 10/14/2011  . Bradycardia   . Subarachnoid hemorrhage following injury ([place])   . Hyperlipidemia 03/05/2011  . Takotsubo syndrome 04/22/2008  . Hemiplegia, late effect of cerebrovascular disease ([place]) 07/22/2007  . THYROID NODULE 01/14/2007  . Pulmonary nodule 01/14/2007  . MENINGIOMA 08/27/2006  . Essential hypertension 07/15/2006  . Osteoarthritis 07/15/2006    Past Surgical History:  Procedure Laterality Date  . ABDOMINAL HYSTERECTOMY    . BRAIN TUMOR EXCISION     Meningioma; [facility] in 2008  . CHOLECYSTECTOMY    . JOINT REPLACEMENT     rt THR  . SHOULDER SURGERY       OB History   None      Home Medications    Prior to Admission medications  Medication Sig Start Date End Date Taking? Authorizing Provider  acetaminophen (TYLENOL) 325 MG tablet Take 325 mg by mouth every 6 (six) hours as needed for mild pain or moderate pain.    [provider]  clopidogrel (PLAVIX) 75 MG tablet TAKE 1 TABLET DAILY WITH BREAKFAST 07/07/17   Marletta Lor, MD  cyanocobalamin (,VITAMIN B-12,) 1000 MCG/ML injection INJECT 1 ML (1000 MCG TOTAL) INTRAMUSCULARLY EVERY 30 DAYS 05/27/17   Marletta Lor, MD  cycloSPORINE (RESTASIS) 0.05 % ophthalmic emulsion 1 drop 2 (two) times daily.    [provider]  dexamethasone (DECADRON) 2 MG tablet Take 1.5 tablets by mouth daily. 11/25/17   [provider]  feeding supplement, ENSURE ENLIVE, (ENSURE ENLIVE) LIQD Take 237 mLs by mouth 2 (two) times daily between meals. 06/14/17   Lavina Hamman, MD  isosorbide mononitrate (IMDUR) 30 MG 24 hr tablet Take 1 tablet (30 mg total) by mouth daily. Patient taking differently: Take 15 mg by mouth daily.  08/11/17   Marletta Lor, MD  methimazole (TAPAZOLE) 5 MG tablet TAKE 1 TABLET (5 MG TOTALLY) THREE TIMES A WEEK, MON, WED, AND FRI 10/04/17   Renato Shin, MD  NEEDLE, DISP, 25 G (BD DISP NEEDLES) 25G X 5/8" MISC USE ONCE A MONTH FOR B12 INJECTIONS AS DIRECTED 07/30/17   Marletta Lor, MD  nitroGLYCERIN (NITROSTAT) 0.4 MG SL tablet Place 1 tablet (0.4 mg total) under the tongue every 5 (five) minutes as needed. For chest pain. 05/04/13   Marletta Lor, MD  OVER THE COUNTER MEDICATION Take 1 Package by mouth See admin instructions. Take 1 pack of Spark advocare by mouth everyday for energy    [provider]  polyethylene glycol (MIRALAX / GLYCOLAX) packet Take 17 g by mouth daily as needed for mild constipation. 06/14/17   Lavina Hamman, MD  pravastatin (PRAVACHOL) 20 MG tablet TAKE 1 TABLET DAILY IN THE EVENING 07/07/17   Marletta Lor, MD  PREMARIN vaginal cream USE 0.5 GRAM VAGINALLY TWICE A WEEK AS NEEDED 01/05/15   Marletta Lor, MD  Syringe, Disposable, (B-D SYRINGE LUER-LOK 3CC) 3 ML MISC USE AS DIRECTED ONCE A MONTH FOR B12 INJECTIONS 11/28/16   Marletta Lor, MD    Family History Family History  Problem Relation Age of Onset  . Heart attack Brother 9  . Stroke Brother   . Diabetes Brother   . Stroke Sister     Social History Social History   Tobacco Use  . Smoking status: Never Smoker  . Smokeless tobacco: Never Used  Substance Use Topics  . Alcohol use: No    Alcohol/week: 0.0 standard drinks  . Drug use: No      Allergies   Atorvastatin; Fluorometholone; Influenza vaccines; Phenytoin; Promethazine hcl; and Sulfamethoxazole   Review of Systems Review of Systems  Musculoskeletal: Positive for arthralgias and gait problem.       Walks with walker.  Right hip pain.  Chronic left wrist pain from old fracture.  Chronically wears a Velcro splint on left wrist  Hematological: Bruises/bleeds easily.  All other systems reviewed and are negative.    Physical Exam Updated Vital Signs BP (!) 187/67   Pulse (!) 44   Temp (!) 97.5 F (36.4 C) (Oral)   Ht 5\' 4"  (1.626 m)   Wt 53.1 kg   SpO2 100%   BMI 20.08 kg/m   Physical Exam  Constitutional: She is oriented to person, place, and time.  Chronically ill-appearing, frail  HENT:  Head: Normocephalic and atraumatic.  Eyes: Pupils are equal, round, and reactive to light. Conjunctivae are normal.  Neck: Neck supple. No tracheal deviation present. No thyromegaly present.  Cardiovascular: Intact distal pulses.  No murmur heard. Bradycardic, irregular, grade 3/6 systolic murmur  Pulmonary/Chest: Effort normal and breath sounds normal.  Abdominal: Soft. Bowel sounds are normal. She exhibits no distension. There is no tenderness.  Musculoskeletal: Normal range of motion. She exhibits no edema or tenderness.  Tire spine nontender.  Thoracic kyphosis.  Pelvis stable.  Right lower extremity no deformity she is tender at the hip with pain on internal rotation of the thigh.  DP pulse 2+.  Good capillary refill.  Left upper extremity with deformity at wrist.  None tender.  Radial pulse 2+.  Good capillary refill.  All other extremities without contusion abrasion or tenderness neurovascular intact  Neurological: She is alert and oriented to person, place, and time. No cranial nerve deficit. Coordination normal.  Skin: Skin is warm and dry. No rash noted.  Psychiatric: She has a normal mood and affect.  Nursing note and vitals reviewed.    ED Treatments  / Results  Labs (all labs ordered are listed, but only abnormal results are displayed) Labs Reviewed - No data to display  EKG EKG Interpretation  Date/Time:  Saturday December 13 2017 07:54:59 EST Ventricular Rate:  91 PR Interval:    QRS Duration: 91 QT Interval:  382 QTC Calculation: 395 R Axis:   -12 Text Interpretation:  Sinus rhythm Ventricular bigeminy Borderline prolonged PR interval Low voltage, precordial leads Minimal ST depression, diffuse leads No significant change since last tracing Confirmed by Orlie Dakin [phone]) on 12/13/2017 8:06:49 AM   Radiology No results found.  Procedures Procedures (including critical care time)  Medications Ordered in ED Medications  fentaNYL (SUBLIMAZE) injection 100 mcg (has no administration in time range)   8:15 AM further history from patient's daughter who arrived patient fell 1 AM today.  She was doing well before that.  She was treated with Tylenol immediately after the fall and again at 5 AM.  She was gotten up off the floor by family and put to bed this morning. X-rays viewed by me Results for orders placed or performed during the hospital encounter of 48/18/56  Basic metabolic panel  Result Value Ref Range   Sodium 137 135 - 145 mmol/L   Potassium 4.0 3.5 - 5.1 mmol/L   Chloride 108 98 - 111 mmol/L   CO2 21 (L) 22 - 32 mmol/L   Glucose, Bld 131 (H) 70 - 99 mg/dL   BUN 38 (H) 8 - 23 mg/dL   Creatinine, Ser 0.84 0.44 - 1.00 mg/dL   Calcium 8.5 (L) 8.9 - 10.3 mg/dL   GFR calc non Af Amer 58 (L) >60 mL/min   GFR calc Af Amer >60 >60 mL/min   Anion gap 8 5 - 15  CBC with Differential/Platelet  Result Value Ref Range   WBC 17.3 (H) 4.0 - 10.5 K/uL   RBC 3.70 (L) 3.87 - 5.11 MIL/uL   Hemoglobin 11.2 (L) 12.0 - 15.0 g/dL   HCT 35.9 (L) 36.0 - 46.0 %   MCV 97.0 80.0 - 100.0 fL   MCH 30.3 26.0 - 34.0 pg   MCHC 31.2 30.0 - 36.0 g/dL   RDW 14.9 11.5 - 15.5 %   Platelets 184 150 - 400 K/uL   nRBC 0.0 0.0 - 0.2 %    Neutrophils  Relative % 79 %   Neutro Abs 13.6 (H) 1.7 - 7.7 K/uL   Lymphocytes Relative 10 %   Lymphs Abs 1.8 0.7 - 4.0 K/uL   Monocytes Relative 8 %   Monocytes Absolute 1.3 (H) 0.1 - 1.0 K/uL   Eosinophils Relative 1 %   Eosinophils Absolute 0.2 0.0 - 0.5 K/uL   Basophils Relative 0 %   Basophils Absolute 0.0 0.0 - 0.1 K/uL   Immature Granulocytes 2 %   Abs Immature Granulocytes 0.31 (H) 0.00 - 0.07 K/uL   Dg Chest 2 View  Result Date: 12/11/2017 CLINICAL DATA:  Leukocytosis, dizziness, headache, history coronary artery disease post MI, hypertension, stroke EXAM: CHEST - 2 VIEW COMPARISON:  06/12/2017 FINDINGS: Enlargement of cardiac silhouette. Mediastinal contours and pulmonary vascularity normal. Atherosclerotic calcification aorta. Persistent nodular density at RIGHT apex, 13 mm diameter previously 12 mm. Emphysematous and minimal bronchitic changes consistent with COPD. Minimal chronic accentuation of pulmonary markings without acute infiltrate, pleural effusion or pneumothorax. Bones demineralized. IMPRESSION: Changes of COPD with persistent nodular focus at the RIGHT upper lobe now 13 mm diameter previously 12 mm; further evaluation by CT chest recommended. No acute infiltrates. These results will be called to the ordering clinician or representative by the Radiologist Assistant, and communication documented in the PACS or zVision Dashboard. Electronically Signed   By: Lavonia Dana M.D.   On: 12/11/2017 08:24   Ct Pelvis Wo Contrast  Result Date: 12/13/2017 CLINICAL DATA:  Fall at 1 a.m.  Right leg and hip pain. EXAM: CT PELVIS WITHOUT CONTRAST TECHNIQUE: Multidetector CT imaging of the pelvis was performed following the standard protocol without intravenous contrast. COMPARISON:  Pelvic CT from 09/13/2011 and pelvic radiographs 12/13/2017 FINDINGS: Urinary Tract:  Unremarkable Bowel:  Sigmoid colon diverticulosis. Vascular/Lymphatic: Aortoiliac atherosclerotic vascular disease.  Reproductive:  Uterus absent.  Adnexa unremarkable. Other:  No supplemental non-categorized findings. Musculoskeletal: Displaced fracture of the right inferior pubic ramus, 9 mm displacement observed. There is fracture of the right acetabulum involving the quadrilateral plate on image 78/2, and also extending into the anterior wall in the vicinity of a lucency adjacent to the hemiarthroplasty which may be from poly ethylene where. Chronic avulsion of the distal right piriformis muscle with calcification along its distal margin, and asymmetric atrophy of the muscle belly. No diastasis of the pubis or sacroiliac joints. I do not observe a well-defined sacral fracture, if a sacral fractures present it is occult by CT. Bony demineralization is present. Right hip hemiarthroplasty common no proximal femoral fracture but the distal stem of the arthroplasty is not fully included. Moderate to severe degenerative chondral thinning in the left hip joint with marginal spurring. No appreciable fracture at L4 or L5. Chronic atrophy of the gluteus medius and minimus muscles, left greater than right. IMPRESSION: 1. Fracture of the anterior wall of the right acetabulum through a lucency which may be from polyethylene particulate disease. This fracture also involves the quadrilateral plate of the right acetabulum, and there is a transverse mildly displaced fracture of the right inferior pubic ramus. 2. Chronic avulsion of the distal right piriformis muscle, with calcification along the distal tendon margin and asymmetric atrophy of the muscle belly. 3. Diffuse bony demineralization. 4. Moderate to severe degenerative arthropathy of the left hip. 5.  Aortic Atherosclerosis (ICD10-I70.0). 6. Sigmoid colon diverticulosis. Electronically Signed   By: Van Clines M.D.   On: 12/13/2017 10:29   Dg Hip Unilat With Pelvis 2-3 Views Right  Result Date: 12/13/2017 CLINICAL DATA:  Fall this morning with right hip pain. EXAM: DG HIP  (WITH OR WITHOUT PELVIS) 2-3V RIGHT COMPARISON:  05/07/2016 FINDINGS: There is moderate diffuse osteopenia. Right total hip arthroplasty is intact and normally located. There is a right inferior pubic ramus fracture which appears acute with minimal displacement. Mild degenerate change of the left hip and spine. IMPRESSION: Right inferior pubic ramus fracture with minimal displacement. Right total hip arthroplasty intact and normally located. Electronically Signed   By: Marin Olp M.D.   On: 12/13/2017 09:12   Initial Impression / Assessment and Plan / ED Course  I have reviewed the triage vital signs and the nursing notes.  Pertinent labs & imaging results that were available during my care of the patient were reviewed by me and considered in my medical decision making (see chart for details).     9:30 AM patient states she is comfortable "as long as I do not move" after treatment with intravenous fentanyl.  She declines further pain medicine presently 11:30 AM patient continues to remain comfortable.  Dr. Louanne Skye from orthopedics consulted.  Will see patient in the hospital.  I also spoke with Dr. Daryll Drown from hospitalist service will arrange for overnight stay off work remarkable for leukocytosis, mild anemia otherwise normal.  Urine is pending Final Clinical Impressions(s) / ED Diagnoses  Diagnoses #1 closed pelvic fracture with fracture of right acetabulum and mildly displaced inferior pubic ramus fracture #2 anemia #3 fall Final diagnoses:  None    ED Discharge Orders    None       Orlie Dakin, MD 12/13/17 1212

## 2017-12-13 NOTE — ED Notes (Signed)
MD aware pt hypertensive.  Will repeat BP manually once pt returns from XR

## 2017-12-13 NOTE — H&P (Signed)
History and Physical    [name] VEH:[id number] DOB: April 14, 1922 DOA: 12/13/2017  PCP: Marletta Lor, MD  Patient coming from: Home  Chief Complaint: Right hip pain  HPI: [name] is a 84 y.o. female with medical history significant of stroke, OA, IBS, HTN, CAD, anemia, hyperthyroidism and most recently meningioma s/p gamma knife surgery at [facility] with brain swelling afterwards which led her to be on decadron (this is a chronic issue, initial surgery in 2008, recurrence in 2010 and gamma knife on 07/24/2017)   She presents today after a fall in the early hours of the morning which led to severe right hip pain and inability to walk.  She has a hip replacement in that hip.  She had imaging which showed a pelvic fracture and right acetabular fracture.  She has no other symptoms currently, denies chest pain, fever, chills, recent illness (beyond the meningioma treatment), urinary problems, changes to her bowels, nausea, vomiting.  She has been gaining weight with the steroids, which she does not like.  She was recently seen by Dr. Elease Hashimoto in [facility] for dizziness which was felt to be vertigo.    ED Course: In the ED, she was treated with fentanyl which helped her pain.  She had Xrays which showed right inferior pubic ramis fracture and CT scan of the pelvis showed right acetabular fracture.  ED called on call [facility], Dr. Louanne Skye who reviewed films and will see the patient.    Review of Systems: As per HPI otherwise 10 point review of systems negative.   Past Medical History:  Diagnosis Date  . Abnormal CT of the chest    Stable patchy/nodular and ground-glass opacities in the right upper lobe, While grossly unchanged, low grade adenocarcinoma remains possible.  Marland Kitchen ANEMIA, PERNICIOUS 09/10/2006  . Bradycardia    August, 2013  . CORONARY ARTERY DISEASE 11/07/2008   a. NSTEMI 2001 & STEMI 2008 managed medically. b. Takotsubo 06/2006. c. Cath 2009 -> med rx.  . CVA WITH LEFT  HEMIPARESIS 07/22/2007   May, 2009, neurology decided aspirin and Plavix at that time.  . DERMATITIS 03/02/2007  . DJD (degenerative joint disease)    L shoulder impingement  . Ejection fraction    EF 60%, echo, July, 2010, (  EF improved after tach a suitable event 2008)  . Family history of anesthesia complication    daughter has a hard time waking up  . H/O hiatal hernia   . HYPERTENSION 07/15/2006  . IBS (irritable bowel syndrome)   . MENINGIOMA 08/27/2006   Occipital meningioma, surgery, [facility], 2008 /  MRI, [place], June, 2011, no change in lesion, history believe the patient had a gamma knife treatment of a right occipital atypical meningioma  . OSTEOARTHRITIS 07/15/2006  . PULMONARY NODULE 01/14/2007   [place], December, 2011  . Stroke ([place])   . Subarachnoid hemorrhage following injury ([place]) 2007   Subarachnoid hemorrhage secondary to a fall December, 2007  . Takotsubo syndrome 04/22/2008   MI, May, 2008, question takotsubo event  . THYROID NODULE 01/14/2007    Past Surgical History:  Procedure Laterality Date  . ABDOMINAL HYSTERECTOMY    . BRAIN TUMOR EXCISION     Meningioma; treated at [facility] in 2008  . CHOLECYSTECTOMY    . JOINT REPLACEMENT     rt THR  . SHOULDER SURGERY       reports that she has never smoked. She has never used smokeless tobacco. She reports that she does not drink alcohol or  use drugs.  Allergies  Allergen Reactions  . Atorvastatin Other (See Comments)    Raises liver enzymes (takes pravastatin at home)  . Fluorometholone     Falling down  . Influenza Vaccines Other (See Comments)    "dizziness"  . Phenytoin Other (See Comments)    Blood clots  . Promethazine Hcl Other (See Comments)    Severe sedation  . Sulfamethoxazole     REACTION: unspecified  Per pt daughter she stated this causes rash   Reviewed with patient Family History  Problem Relation Age of Onset  . Heart attack Brother 14  . Stroke Brother   . Diabetes Brother   .  Stroke Sister     Prior to Admission medications   Medication Sig Start Date End Date Taking? Authorizing Provider  acetaminophen (TYLENOL) 325 MG tablet Take 650 mg by mouth every 6 (six) hours as needed for mild pain or moderate pain.    Yes [provider]  clopidogrel (PLAVIX) 75 MG tablet TAKE 1 TABLET DAILY WITH BREAKFAST 07/07/17  Yes Marletta Lor, MD  cyanocobalamin (,VITAMIN B-12,) 1000 MCG/ML injection INJECT 1 ML (1000 MCG TOTAL) INTRAMUSCULARLY EVERY 30 DAYS Patient taking differently: Inject 1,000 mcg into the muscle every 30 (thirty) days.  05/27/17  Yes Marletta Lor, MD  cycloSPORINE (RESTASIS) 0.05 % ophthalmic emulsion Place 1 drop into both eyes at bedtime as needed (dry eyes).    Yes [provider]  dexamethasone (DECADRON) 2 MG tablet Take 1 mg by mouth daily.  11/25/17 12/14/17 Yes [provider]  isosorbide mononitrate (IMDUR) 30 MG 24 hr tablet Take 1 tablet (30 mg total) by mouth daily. Patient taking differently: Take 15 mg by mouth daily.  08/11/17  Yes Marletta Lor, MD  Melatonin 1 MG TABS Take 1 mg by mouth at bedtime as needed (for sleep).   Yes [provider]  methimazole (TAPAZOLE) 5 MG tablet TAKE 1 TABLET (5 MG TOTALLY) THREE TIMES A WEEK, MON, WED, AND FRI 10/04/17  Yes Renato Shin, MD  nitroGLYCERIN (NITROSTAT) 0.4 MG SL tablet Place 1 tablet (0.4 mg total) under the tongue every 5 (five) minutes as needed. For chest pain. 05/04/13  Yes Marletta Lor, MD  pravastatin (PRAVACHOL) 20 MG tablet TAKE 1 TABLET DAILY IN THE EVENING 07/07/17  Yes Marletta Lor, MD  PREMARIN vaginal cream USE 0.5 GRAM VAGINALLY TWICE A WEEK AS NEEDED Patient taking differently: Place 1 Applicatorful vaginally 2 (two) times a week. As needed 01/05/15  Yes Marletta Lor, MD  feeding supplement, ENSURE ENLIVE, (ENSURE ENLIVE) LIQD Take 237 mLs by mouth 2 (two) times daily between meals. Patient not taking: Reported  on 12/13/2017 06/14/17   Lavina Hamman, MD  NEEDLE, DISP, 25 G (BD DISP NEEDLES) 25G X 5/8" MISC USE ONCE A MONTH FOR B12 INJECTIONS AS DIRECTED 07/30/17   Marletta Lor, MD  polyethylene glycol [facility] / Floria Raveling) packet Take 17 g by mouth daily as needed for mild constipation. Patient not taking: Reported on 12/13/2017 06/14/17   Lavina Hamman, MD  Syringe, Disposable, (B-D SYRINGE LUER-LOK 3CC) 3 ML MISC USE AS DIRECTED ONCE A MONTH FOR B12 INJECTIONS 11/28/16   Marletta Lor, MD    Physical Exam:  Constitutional: Elderly woman, lying in bed Vitals:   12/13/17 1115 12/13/17 1130 12/13/17 1145 12/13/17 1200  BP: 125/60 (!) 165/75 138/65 (!) 162/70  Pulse: (!) 102 60 70 (!) 38  Resp: 17 (!) 21  19 (!) 24  Temp:      TempSrc:      SpO2: 100% 100% 100% 100%  Weight:      Height:       Eyes:  lids and conjunctivae normal ENMT: Mucous membranes are moist. Normal dentition Neck: normal, supple Respiratory: CTAB, no wheezing Cardiovascular: RR, NR, no murmur. Palpable pulses in DP bilaterally and radial arteries Abdomen: No TTP, +BS, non distended Musculoskeletal: She has no clubbing or cyanosis, right leg did not appear shortened or everted on my exam.   Skin: She has dry skin, prominent veins in the feet.   Unable to examine back due to patient being unable to roll over given pain and broken hip.   Neurologic: CN 2-12 grossly intact. Sensation intact to light touch in LE.  She was able to move toes easily.  She had pain with movement of hip.  Psychiatric: Normal judgment and insight.   Labs on Admission: I have personally reviewed following labs and imaging studies  CBC: Recent Labs  Lab 12/09/17 1406 12/13/17 0801  WBC 17.4* 17.3*  NEUTROABS 15.8* 13.6*  HGB 12.5 11.2*  HCT 38.1 35.9*  MCV 93.2 97.0  PLT 230.0 782   Basic Metabolic Panel: Recent Labs  Lab 12/09/17 1406 12/13/17 0801  NA 138 137  K 4.1 4.0  CL 104 108  CO2 24 21*  GLUCOSE 162* 131*    BUN 33* 38*  CREATININE 0.86 0.84  CALCIUM 9.0 8.5*  MG 1.9  --    GFR: Estimated Creatinine Clearance: 35.1 mL/min (by C-G formula based on SCr of 0.84 mg/dL). Liver Function Tests: Recent Labs  Lab 12/09/17 1406  AST 14  ALT 13  ALKPHOS 64  BILITOT 0.4  PROT 6.2  ALBUMIN 4.0   No results for input(s): LIPASE, AMYLASE in the last 168 hours. No results for input(s): AMMONIA in the last 168 hours. Coagulation Profile: No results for input(s): INR, PROTIME in the last 168 hours. Cardiac Enzymes: No results for input(s): CKTOTAL, CKMB, CKMBINDEX, TROPONINI in the last 168 hours. BNP (last 3 results) No results for input(s): PROBNP in the last 8760 hours. HbA1C: No results for input(s): HGBA1C in the last 72 hours. CBG: No results for input(s): GLUCAP in the last 168 hours. Lipid Profile: No results for input(s): CHOL, HDL, LDLCALC, TRIG, CHOLHDL, LDLDIRECT in the last 72 hours. Thyroid Function Tests: No results for input(s): TSH, T4TOTAL, FREET4, T3FREE, THYROIDAB in the last 72 hours. Anemia Panel: No results for input(s): VITAMINB12, FOLATE, FERRITIN, TIBC, IRON, RETICCTPCT in the last 72 hours. Urine analysis:    Component Value Date/Time   COLORURINE YELLOW 06/12/2017 2155   APPEARANCEUR HAZY (A) 06/12/2017 2155   LABSPEC 1.014 06/12/2017 2155   PHURINE 5.0 06/12/2017 2155   GLUCOSEU NEGATIVE 06/12/2017 2155   HGBUR NEGATIVE 06/12/2017 2155   HGBUR negative 09/20/2009 0959   BILIRUBINUR Negative 12/09/2017 1711   KETONESUR 5 (A) 06/12/2017 2155   PROTEINUR Negative 12/09/2017 1711   PROTEINUR NEGATIVE 06/12/2017 2155   UROBILINOGEN 0.2 12/09/2017 1711   UROBILINOGEN 0.2 10/21/2014 0009   NITRITE Negative 12/09/2017 1711   NITRITE NEGATIVE 06/12/2017 2155   LEUKOCYTESUR Negative 12/09/2017 1711    Radiological Exams on Admission: Ct Pelvis Wo Contrast  Result Date: 12/13/2017 CLINICAL DATA:  Fall at 1 a.m.  Right leg and hip pain. EXAM: CT PELVIS WITHOUT  CONTRAST TECHNIQUE: Multidetector CT imaging of the pelvis was performed following the standard protocol without intravenous contrast. COMPARISON:  Pelvic CT from 09/13/2011 and pelvic radiographs 12/13/2017 FINDINGS: Urinary Tract:  Unremarkable Bowel:  Sigmoid colon diverticulosis. Vascular/Lymphatic: Aortoiliac atherosclerotic vascular disease. Reproductive:  Uterus absent.  Adnexa unremarkable. Other:  No supplemental non-categorized findings. Musculoskeletal: Displaced fracture of the right inferior pubic ramus, 9 mm displacement observed. There is fracture of the right acetabulum involving the quadrilateral plate on image 43/1, and also extending into the anterior wall in the vicinity of a lucency adjacent to the hemiarthroplasty which may be from poly ethylene where. Chronic avulsion of the distal right piriformis muscle with calcification along its distal margin, and asymmetric atrophy of the muscle belly. No diastasis of the pubis or sacroiliac joints. I do not observe a well-defined sacral fracture, if a sacral fractures present it is occult by CT. Bony demineralization is present. Right hip hemiarthroplasty common no proximal femoral fracture but the distal stem of the arthroplasty is not fully included. Moderate to severe degenerative chondral thinning in the left hip joint with marginal spurring. No appreciable fracture at L4 or L5. Chronic atrophy of the gluteus medius and minimus muscles, left greater than right. IMPRESSION: 1. Fracture of the anterior wall of the right acetabulum through a lucency which may be from polyethylene particulate disease. This fracture also involves the quadrilateral plate of the right acetabulum, and there is a transverse mildly displaced fracture of the right inferior pubic ramus. 2. Chronic avulsion of the distal right piriformis muscle, with calcification along the distal tendon margin and asymmetric atrophy of the muscle belly. 3. Diffuse bony demineralization. 4.  Moderate to severe degenerative arthropathy of the left hip. 5.  Aortic Atherosclerosis (ICD10-I70.0). 6. Sigmoid colon diverticulosis. Electronically Signed   By: Van Clines M.D.   On: 12/13/2017 10:29   Dg Hip Unilat With Pelvis 2-3 Views Right  Result Date: 12/13/2017 CLINICAL DATA:  Fall this morning with right hip pain. EXAM: DG HIP (WITH OR WITHOUT PELVIS) 2-3V RIGHT COMPARISON:  05/07/2016 FINDINGS: There is moderate diffuse osteopenia. Right total hip arthroplasty is intact and normally located. There is a right inferior pubic ramus fracture which appears acute with minimal displacement. Mild degenerate change of the left hip and spine. IMPRESSION: Right inferior pubic ramus fracture with minimal displacement. Right total hip arthroplasty intact and normally located. Electronically Signed   By: Marin Olp M.D.   On: 12/13/2017 09:12    EKG: Independently reviewed. Sinus, ventricular bigeminy. Not seen on previous EKGs, but she has had PVCs and PACs frequent noted in the past.   Assessment/Plan Pelvic fracture and acetabular fracture, right s/p Fall - From review with daughter and patient, appears to be a mechanical fall.  No LOC or dizziness reported - Pain control with tylenol, oxycodone, morphine - Orthopedic surgery to see - PT/OT evaluation after orthopedic surgery sees the patient - May need short term rehab stay    Hyperthyroidism - She is on low dose methimazole, 3 times a week.  - Continue home dose   HLD - Continue home pravastatin    History of meningioma - Prolonged course, completing a steroid taper now and on 1mg  of decadron for 2 more days per daughter.     Takotsubo Syndrome (2008) and non obstructive CAD on LHC in 2008 - Reviewed chart and apparently she had an MI around the time of her meningioma and was felt to be this syndrome. She is on IMDUR low dose, plavix and pravastatin which will be continued.   DVT prophylaxis: Lovenox  Code Status:  DNR Family  Communication: Daughter [name] in room, [phone].  Would like to be kept informed of any medical discussions/decisions  Disposition Plan: Admit to obs for PT/OT  Consults called: Ortho by ED, [place] Admission status: Obs, Med-surg    Gilles Chiquito MD [facility] Pager [phone]  If 7PM-7AM, please contact night-coverage [email or web address] Password [email or web address]  12/13/2017, 12:45 PM

## 2017-12-13 NOTE — ED Notes (Signed)
Patient transported to CT 

## 2017-12-13 NOTE — ED Notes (Signed)
Patient transported to X-ray 

## 2017-12-13 NOTE — ED Triage Notes (Addendum)
Per EMS: Pt from home with c/o mechanical fall that happened 0100 this AM.  Pt has right leg and hip pain.  Pt takes Plavix.  Usually walks with walker at home.  No meds given by EMS.

## 2017-12-14 ENCOUNTER — Encounter (HOSPITAL_COMMUNITY): Payer: Self-pay | Admitting: Internal Medicine

## 2017-12-14 DIAGNOSIS — R52 Pain, unspecified: Secondary | ICD-10-CM

## 2017-12-14 DIAGNOSIS — I10 Essential (primary) hypertension: Secondary | ICD-10-CM | POA: Diagnosis not present

## 2017-12-14 DIAGNOSIS — Z86018 Personal history of other benign neoplasm: Secondary | ICD-10-CM | POA: Diagnosis not present

## 2017-12-14 DIAGNOSIS — E0590 Thyrotoxicosis, unspecified without thyrotoxic crisis or storm: Secondary | ICD-10-CM | POA: Diagnosis not present

## 2017-12-14 DIAGNOSIS — S32401A Unspecified fracture of right acetabulum, initial encounter for closed fracture: Secondary | ICD-10-CM | POA: Diagnosis not present

## 2017-12-14 LAB — BASIC METABOLIC PANEL
Anion gap: 5 (ref 5–15)
BUN: 27 mg/dL — ABNORMAL HIGH (ref 8–23)
CO2: 26 mmol/L (ref 22–32)
CREATININE: 0.87 mg/dL (ref 0.44–1.00)
Calcium: 8.6 mg/dL — ABNORMAL LOW (ref 8.9–10.3)
Chloride: 105 mmol/L (ref 98–111)
GFR, EST NON AFRICAN AMERICAN: 56 mL/min — AB (ref >60)
Glucose, Bld: 110 mg/dL — ABNORMAL HIGH (ref 70–99)
POTASSIUM: 4.4 mmol/L (ref 3.5–5.1)
SODIUM: 136 mmol/L (ref 135–145)

## 2017-12-14 LAB — PROTIME-INR
INR: 0.96
PROTHROMBIN TIME: 12.7 s (ref 11.4–15.2)

## 2017-12-14 LAB — CBC
HCT: 37.8 % (ref 36.0–46.0)
Hemoglobin: 11.6 g/dL — ABNORMAL LOW (ref 12.0–15.0)
MCH: 29.5 pg (ref 26.0–34.0)
MCHC: 30.7 g/dL (ref 30.0–36.0)
MCV: 96.2 fL (ref 80.0–100.0)
NRBC: 0 % (ref 0.0–0.2)
Platelets: 201 10*3/uL (ref 150–400)
RBC: 3.93 MIL/uL (ref 3.87–5.11)
RDW: 14.6 % (ref 11.5–15.5)
WBC: 15.4 10*3/uL — AB (ref 4.0–10.5)

## 2017-12-14 MED ORDER — HYDROCODONE-ACETAMINOPHEN 5-325 MG PO TABS: 1.0000 | ORAL_TABLET | ORAL | Status: DC | PRN

## 2017-12-14 MED ORDER — ACETAMINOPHEN 325 MG PO TABS
650.0000 mg | ORAL_TABLET | Freq: Three times a day (TID) | ORAL | Status: DC
  Administered 2017-12-14 – 2017-12-15 (×2): 650 mg via ORAL
  Filled 2017-12-14 (×2): qty 2

## 2017-12-14 MED ORDER — MELATONIN 3 MG PO TABS
1.5000 mg | ORAL_TABLET | Freq: Every evening | ORAL | Status: DC | PRN
  Administered 2017-12-15: 1.5 mg via ORAL
  Filled 2017-12-14 (×2): qty 0.5

## 2017-12-14 MED ORDER — ACETAMINOPHEN 325 MG PO TABS: 650.0000 mg | ORAL_TABLET | Freq: Three times a day (TID) | ORAL | Status: DC

## 2017-12-14 MED ORDER — MORPHINE SULFATE (PF) 2 MG/ML IV SOLN: 0.5000 mg | INTRAVENOUS | Status: DC | PRN

## 2017-12-14 NOTE — Care Management Obs Status (Signed)
[facility] NOTIFICATION   Patient Details  Name: [name] MRN: [id number] Date of Birth: 02/28/1922   Medicare Observation Status Notification Given:  Yes    Carles Collet, RN 12/14/2017, 12:08 PM

## 2017-12-14 NOTE — Evaluation (Signed)
Occupational Therapy Evaluation Patient Details Name: [name] MRN: [id number] DOB: [birth year] Today's Date: 12/14/2017    History of Present Illness Admitted after fall resulting in R inferior pubic ramus fracture and nondisplaced T acetabular fracture; Opting for conservative, non-op management; PWB 50% RLE with walker;  has a past medical history of ANEMIA, PERNICIOUS (09/10/2006), Bradycardia, CAD (11/07/2008), CVA WITH LEFT HEMIPARESIS (07/22/2007), DERMATITIS (03/02/2007), DJD (degenerative joint disease), Ejection fraction,  Stroke, Subarachnoid hemorrhage following injury (2007), Takotsubo syndrome (04/22/2008), history of falls, with previous L raduis fx, and contusion of R greater trochanter.   Clinical Impression   PTA, pt was living with her daughter and son-in-law and was performing BADLs and using RW. Pt currently requiring Min A for UB ADLs, Mod-Max A for LB ADLs, and Max A +2 for functional transfers. Pt would benefit from further acute OT to facilitate safe dc. Recommend dc to SNF for further OT to optimize safety, independence with ADLs, and return to PLOF.      Follow Up Recommendations  SNF;Supervision/Assistance - 24 hour    Equipment Recommendations  Other (comment)(Defer to next venue)    Recommendations for Other Services PT consult     Precautions / Restrictions Precautions Precautions: Fall Restrictions Weight Bearing Restrictions: Yes RLE Weight Bearing: Partial weight bearing RLE Partial Weight Bearing Percentage or Pounds: 50%      Mobility Bed Mobility Overal bed mobility: Needs Assistance Bed Mobility: Supine to Sit     Supine to sit: Min assist;+2 for safety/equipment;HOB elevated     General bed mobility comments: Min A to assist with LEs  Transfers Overall transfer level: Needs assistance   Transfers: Sit to/from [employer] Transfers Sit to Stand: Mod assist;+2 physical assistance Stand pivot transfers: Max assist;+2 physical  assistance       General transfer comment: Mod A to power up into standing and then gain balance. Requiring Max A +2 (two person hand held A) to maintain WB status and pivot to left    Balance Overall balance assessment: Needs assistance Sitting-balance support: Feet supported;No upper extremity supported Sitting balance-Leahy Scale: Fair     Standing balance support: Bilateral upper extremity supported;During functional activity Standing balance-Leahy Scale: Poor                             ADL either performed or assessed with clinical judgement   ADL Overall ADL's : Needs assistance/impaired Eating/Feeding: Supervision/ safety;Set up;Sitting   Grooming: Supervision/safety;Set up;Sitting   Upper Body Bathing: Minimal assistance;Sitting   Lower Body Bathing: Moderate assistance;Sit to/from stand   Upper Body Dressing : Minimal assistance;Sitting   Lower Body Dressing: Maximal assistance;Sit to/from stand Lower Body Dressing Details (indicate cue type and reason): don socks with Max A Toilet Transfer: Maximal assistance;+2 for physical assistance;Stand-pivot(two person hand held) Toilet Transfer Details (indicate cue type and reason): Mod A for ist<>stand and then Max A to maintain standing and pivot towards left. Pt requiring Max A to maintain WB status         Functional mobility during ADLs: Maximal assistance;+2 for physical assistance(stand pivot) General ADL Comments: Pt with decreased strength and balance. Pt fatigues quickly. However, is highly motivated and works Land      Pertinent Vitals/Pain Pain Assessment: 0-10 Pain Score: 8  Pain Location: Left lower back and hip Pain Descriptors / Indicators: Discomfort;Grimacing  Pain Intervention(s): Limited activity within patient's tolerance;Monitored during session;Repositioned     Hand Dominance Right   Extremity/Trunk Assessment Upper Extremity  Assessment Upper Extremity Assessment: Generalized weakness;LUE deficits/detail LUE Deficits / Details: Left wrist fx from prior fall. Wears wrist splint for protection   Lower Extremity Assessment Lower Extremity Assessment: Defer to PT evaluation   Cervical / Trunk Assessment Cervical / Trunk Assessment: Kyphotic   Communication Communication Communication: HOH   Cognition Arousal/Alertness: Awake/alert Behavior During Therapy: WFL for tasks assessed/performed Overall Cognitive Status: Within Functional Limits for tasks assessed                                     General Comments  Son in law present throughout session    Exercises     Shoulder Instructions      Home Living Family/patient expects to be discharged to:: Skilled nursing facility Living Arrangements: Children(Daughter and son-in-law) Available Help at Discharge: Family;Available 24 hours/day                         Home Equipment: Walker - 2 wheels;Cane - single point          Prior Functioning/Environment Level of Independence: Independent with assistive device(s)        Comments: Used RW since May for functional mobility. Performing BADLs        OT Problem List: Decreased strength;Decreased range of motion;Decreased activity tolerance;Impaired balance (sitting and/or standing);Decreased safety awareness;Decreased knowledge of use of DME or AE;Decreased knowledge of precautions;Pain      OT Treatment/Interventions: Self-care/ADL training;Therapeutic exercise;Energy conservation;DME and/or AE instruction;Therapeutic activities;Patient/family education    OT Goals(Current goals can be found in the care plan section) Acute Rehab OT Goals Patient Stated Goal: "Walk again" OT Goal Formulation: With patient Time For Goal Achievement: 12/28/17 Potential to Achieve Goals: Good  OT Frequency: Min 2X/week   Barriers to D/C:            Co-evaluation PT/OT/SLP  Co-Evaluation/Treatment: Yes Reason for Co-Treatment: For patient/therapist safety;To address functional/ADL transfers   OT goals addressed during session: ADL's and self-care      AM-PAC PT "6 Clicks" Daily Activity     Outcome Measure Help from another person eating meals?: None Help from another person taking care of personal grooming?: None Help from another person toileting, which includes using toliet, bedpan, or urinal?: A Lot Help from another person bathing (including washing, rinsing, drying)?: A Lot Help from another person to put on and taking off regular upper body clothing?: A Little Help from another person to put on and taking off regular lower body clothing?: A Lot 6 Click Score: 17   End of Session Equipment Utilized During Treatment: Gait belt Nurse Communication: Mobility status;Precautions;Weight bearing status  Activity Tolerance: Patient tolerated treatment well;Patient limited by fatigue Patient left: in chair;with call bell/phone within reach;with chair alarm set;with family/visitor present  OT Visit Diagnosis: Unsteadiness on feet (R26.81);Other abnormalities of gait and mobility (R26.89);Muscle weakness (generalized) (M62.81);Pain Pain - Right/Left: Left Pain - part of body: (Lower back)                Time: 1018-1040 OT Time Calculation (min): 22 min Charges:  OT General Charges $OT Visit: 1 Visit OT Evaluation $OT Eval Moderate Complexity: Brooks, OTR/L Acute Rehab Pager: [phone] Office: [place] 12/14/2017, 11:47 AM

## 2017-12-14 NOTE — Clinical Social Work Note (Addendum)
Clinical Social Work Assessment  Patient Details  Name: [name] MRN: [id number] Date of Birth: September 05, 1922  Date of referral:  12/14/17               Reason for consult:  Facility Placement                Permission sought to share information with:  Family Supports Permission granted to share information::  Yes, Release of Information Signed  Name::     Doctor, hospital::     Relationship::  daughter  Contact Information:     Housing/Transportation Living arrangements for the past 2 months:  Single Family Home Source of Information:  Adult Children Patient Interpreter Needed:  None Criminal Activity/Legal Involvement Pertinent to Current Situation/Hospitalization:  No - Comment as needed Significant Relationships:  Adult Children Lives with:  Adult Children Do you feel safe going back to the place where you live?  No Need for family participation in patient care:  No (Coment)  Care giving concerns:  Pt is disoriented to situation. CSW spoke with pt's daughter via telephone.   Social Worker assessment / plan:  Pt's daughter states she is agreeable to pt going to SNF at d/c. IN the past pt's daughter has taken pt home. Pt's daughter had a sister who was in and out of several SNF's that she was unimpressed with. Pt's daughter will only send her mom to [facility] or [facility]--CSW explained both of these facilities were ALF's--pt's daughter understanding. Pt's daughter is for sure she doesn't want pt to go to [facility]. CSW provided a list of facilities at bedside.  Employment status:  Retired Nurse, adult PT Recommendations:  [place] / Referral to community resources:  [place]  Patient/Family's Response to care:  Pt's daughter verbalized understanding of CSW role and expressed appreciation for support. Pt's daughter denies any concern regarding pt care at this time.   Patient/Family's Understanding  of and Emotional Response to Diagnosis, Current Treatment, and Prognosis:  Pt's daughter understanding and realistic regarding pt's physical limitations. Pt's daughter understands the need for pt to go to SNF at d/c.  Pt's daughter denies any concern regarding pt's treatment plan at this time. CSW will continue to provide support and facilitate d/c needs.   Emotional Assessment Appearance:  Appears stated age Attitude/Demeanor/Rapport:  Unable to Assess Affect (typically observed):  Unable to Assess Orientation:  Oriented to Self, Oriented to Place, Oriented to  Time Alcohol / Substance use:  Not Applicable Psych involvement (Current and /or in the community):  No (Comment)  Discharge Needs  Concerns to be addressed:  Care Coordination, Basic Needs Readmission within the last 30 days:  Yes Current discharge risk:  Dependent with Mobility Barriers to Discharge:  Continued Medical Work up   [employer], LCSW 12/14/2017, 11:04 AM

## 2017-12-14 NOTE — Progress Notes (Signed)
     Subjective:   Awake, alert and oriented x 4. Some pain left buttock and left low back. Passive urine collector. Tolerating po nourishment.  PT/OT today able to transfer bed to chair today. SNF placement for gradual progressive weight bearing right hip and pelvis.   Patient reports pain as moderate.    Objective:   VITALS:  Temp:  [97.9 F (36.6 C)-98.1 F (36.7 C)] 98.1 F (36.7 C) (11/10 0407) Pulse Rate:  [66-80] 80 (11/10 0407) Resp:  [16-20] 20 (11/10 0407) BP: (145-147)/(60-83) 145/78 (11/10 0407) SpO2:  [99 %-100 %] 100 % (11/10 0407)  Neurologically intact ABD soft Neurovascular intact Sensation intact distally Intact pulses distally Dorsiflexion/Plantar flexion intact Compartment soft   LABS Recent Labs    12/13/17 0801 12/14/17 0340  HGB 11.2* 11.6*  WBC 17.3* 15.4*  PLT 184 201   Recent Labs    12/13/17 0801 12/14/17 0340  NA 137 136  K 4.0 4.4  CL 108 105  CO2 21* 26  BUN 38* 27*  CREATININE 0.84 0.87  GLUCOSE 131* 110*   Recent Labs    12/14/17 0340  INR 0.96     Assessment/Plan: Right pelvis fractures. Peri acetabular anterior column right THR acetabular component appears stable. Right pubic ramus fx. Multiple falls Osteopenia     Advance diet Up with therapy Discharge to SNF  Basil Dess 12/14/2017, 1:00 PMPatient ID: [name], female   DOB: Dec 03, 1922, 84 y.o.   MRN: [id number]

## 2017-12-14 NOTE — NC FL2 (Signed)
[place] LEVEL OF CARE SCREENING TOOL     IDENTIFICATION  Patient Name: [name] Birthdate: 1922/05/04 Sex: female Admission Date (Current Location): 12/13/2017  [facility] and [place] Number:  Herbalist and Address:  [facility] [place], [place] [address] [place],  [address]      Provider Number: [id number]  Attending Physician Name and Address:  Geradine Girt, DO  Relative Name and Phone Number:       Current Level of Care: Hospital Recommended Level of Care: [place] Prior Approval Number:    Date Approved/Denied:   PASRR Number: [id number]  Discharge Plan: SNF    Current Diagnoses: Patient Active Problem List   Diagnosis Date Noted  . Pelvic fracture ([place]) 12/13/2017  . Protein-calorie malnutrition, severe 06/13/2017  . Fall 06/12/2017  . Headache 09/25/2016  . History of stroke 11/15/2014  . History of meningioma 11/15/2014  . GERD (gastroesophageal reflux disease) 11/19/2011  . Dysphagia 11/18/2011  . Palpitations 11/17/2011  . Encounter for long-term (current) use of other medications 10/29/2011  . Hyperthyroidism 10/14/2011  . Vertigo 10/14/2011  . Bradycardia   . Subarachnoid hemorrhage following injury ([place])   . Hyperlipidemia 03/05/2011  . Takotsubo syndrome 04/22/2008  . Hemiplegia, late effect of cerebrovascular disease ([place]) 07/22/2007  . THYROID NODULE 01/14/2007  . Pulmonary nodule 01/14/2007  . MENINGIOMA 08/27/2006  . Essential hypertension 07/15/2006  . Osteoarthritis 07/15/2006    Orientation RESPIRATION BLADDER Height & Weight     Time, Place, Self  Normal Continent Weight: 117 lb (53.1 kg) Height:  5\' 4"  (162.6 cm)  BEHAVIORAL SYMPTOMS/MOOD NEUROLOGICAL BOWEL NUTRITION STATUS      Continent Diet(heart healthy/carb modified, thin liquids)  AMBULATORY STATUS COMMUNICATION OF NEEDS Skin   Extensive Assist Verbally Normal                       Personal Care  Assistance Level of Assistance  Bathing, Feeding, Dressing Bathing Assistance: Maximum assistance Feeding assistance: Limited assistance Dressing Assistance: Maximum assistance     Functional Limitations Info  Sight, Hearing, Speech Sight Info: Adequate Hearing Info: Adequate Speech Info: Adequate    SPECIAL CARE FACTORS FREQUENCY  PT (By licensed PT), OT (By licensed OT)                    Contractures Contractures Info: Not present    Additional Factors Info  Code Status, Allergies Code Status Info: Full Code Allergies Info:  Atorvastatin, Fluorometholone, Influenza Vaccines, Phenytoin, Promethazine Hcl, Sulfamethoxazole           Current Medications (12/14/2017):  This is the current hospital active medication list Current Facility-Administered Medications  Medication Dose Route Frequency Provider Last Rate Last Dose  . acetaminophen (TYLENOL) tablet 650 mg  650 mg Oral Q6H PRN Sid Falcon, MD   650 mg at 12/14/17 0959  . aspirin EC tablet 325 mg  325 mg Oral Daily Jessy Oto, MD   325 mg at 12/14/17 0848  . bisacodyl (DULCOLAX) EC tablet 5 mg  5 mg Oral Daily PRN Jessy Oto, MD      . clopidogrel (PLAVIX) tablet 75 mg  75 mg Oral Q breakfast Sid Falcon, MD   75 mg at 12/14/17 0848  . cycloSPORINE (RESTASIS) 0.05 % ophthalmic emulsion 1 drop  1 drop Both Eyes QHS PRN Gilles Chiquito B, MD      . dexamethasone (DECADRON) tablet 1 mg  1 mg Oral Daily Gilles Chiquito B, MD   1 mg at 12/14/17 0847  . docusate sodium (COLACE) capsule 100 mg  100 mg Oral BID Jessy Oto, MD   100 mg at 12/14/17 0848  . enoxaparin (LOVENOX) injection 40 mg  40 mg Subcutaneous Q24H Gilles Chiquito B, MD      . HYDROcodone-acetaminophen (NORCO/VICODIN) 5-325 MG per tablet 1-2 tablet  1-2 tablet Oral Q6H PRN Jessy Oto, MD   2 tablet at 12/14/17 0547  . isosorbide mononitrate (IMDUR) 24 hr tablet 15 mg  15 mg Oral Daily Gilles Chiquito B, MD   15 mg at 12/14/17 0848  .  Melatonin TABS 1 mg  1 mg Oral QHS PRN Sid Falcon, MD      . Derrill Memo ON 12/15/2017] methimazole (TAPAZOLE) tablet 5 mg  5 mg Oral Q M,W,F Gilles Chiquito B, MD      . methocarbamol (ROBAXIN) tablet 500 mg  500 mg Oral Q6H PRN Jessy Oto, MD   500 mg at 12/14/17 1059   Or  . methocarbamol (ROBAXIN) 500 mg in dextrose 5 % 50 mL IVPB  500 mg Intravenous Q6H PRN Jessy Oto, MD      . morphine 2 MG/ML injection 0.5 mg  0.5 mg Intravenous Q2H PRN Jessy Oto, MD      . nitroGLYCERIN (NITROSTAT) SL tablet 0.4 mg  0.4 mg Sublingual Q5 min PRN Gilles Chiquito B, MD      . oxyCODONE (Oxy IR/ROXICODONE) immediate release tablet 5 mg  5 mg Oral Q4H PRN Gilles Chiquito B, MD      . polyethylene glycol (MIRALAX / GLYCOLAX) packet 17 g  17 g Oral Daily PRN Jessy Oto, MD      . pravastatin (PRAVACHOL) tablet 20 mg  20 mg Oral QPM Gilles Chiquito B, MD   20 mg at 12/13/17 1714  . sodium chloride flush (NS) 0.9 % injection 3 mL  3 mL Intravenous Q12H Gilles Chiquito B, MD   3 mL at 12/14/17 0849  . sodium phosphate (FLEET) 7-19 GM/118ML enema 1 enema  1 enema Rectal Once PRN Jessy Oto, MD         Discharge Medications: Please see discharge summary for a list of discharge medications.  Relevant Imaging Results:  Relevant Lab Results:   Additional Information SSN; [id number]  Eileen Stanford, LCSW

## 2017-12-14 NOTE — Evaluation (Signed)
Physical Therapy Evaluation Patient Details Name: [name] MRN: [id number] DOB: 01/23/1923 Today's Date: 12/14/2017   History of Present Illness  Admitted after fall resulting in R inferior pubic ramus fracture and nondisplaced T acetabular fracture; Opting for conservative, non-op management; PWB 50% RLE with walker;  has a past medical history of ANEMIA, PERNICIOUS (09/10/2006), Bradycardia, CAD (11/07/2008), CVA WITH LEFT HEMIPARESIS (07/22/2007), DERMATITIS (03/02/2007), DJD (degenerative joint disease), Ejection fraction,  Stroke, Subarachnoid hemorrhage following injury (2007), Takotsubo syndrome (04/22/2008), history of falls, with previous L raduis fx, and contusion of R greater trochanter.  Clinical Impression   Pt admitted with above diagnosis. Pt currently with functional limitations due to the deficits listed below (see PT Problem List). Walked with RW prior to admission; lived with daughter and son-in-law; Presents with decr functional mobility, pain, decr activity tolerance, incr fall risk;  Pt will benefit from skilled PT to increase their independence and safety with mobility to allow discharge to the venue listed below.       Follow Up Recommendations SNF    Equipment Recommendations  Rolling walker with 5" wheels;3in1 (PT)    Recommendations for Other Services       Precautions / Restrictions Precautions Precautions: Fall Restrictions Weight Bearing Restrictions: Yes RLE Weight Bearing: Partial weight bearing RLE Partial Weight Bearing Percentage or Pounds: 50%      Mobility  Bed Mobility Overal bed mobility: Needs Assistance Bed Mobility: Supine to Sit     Supine to sit: Min assist;+2 for safety/equipment;HOB elevated     General bed mobility comments: Min A to assist with LEs  Transfers Overall transfer level: Needs assistance   Transfers: Sit to/from [employer] Transfers Sit to Stand: Mod assist;+2 physical assistance Stand pivot transfers:  Max assist;+2 physical assistance       General transfer comment: Mod A to power up into standing and then gain balance. Requiring Max A +2 (two person hand held A) to maintain WB status and pivot to left  Ambulation/Gait                Stairs            Wheelchair Mobility    Modified Rankin (Stroke Patients Only)       Balance Overall balance assessment: Needs assistance Sitting-balance support: Feet supported;No upper extremity supported Sitting balance-Leahy Scale: Fair     Standing balance support: Bilateral upper extremity supported;During functional activity Standing balance-Leahy Scale: Poor                               Pertinent Vitals/Pain Pain Assessment: 0-10 Pain Score: 8  Pain Location: Left lower back and hip Pain Descriptors / Indicators: Discomfort;Grimacing Pain Intervention(s): Monitored during session    Home Living Family/patient expects to be discharged to:: Skilled nursing facility Living Arrangements: Children(Daughter and son-in-law) Available Help at Discharge: Family;Available 24 hours/day           Home Equipment: Walker - 2 wheels;Cane - single point      Prior Function Level of Independence: Independent with assistive device(s)         Comments: Used RW since May for functional mobility. Performing BADLs     Hand Dominance   Dominant Hand: Right    Extremity/Trunk Assessment   Upper Extremity Assessment Upper Extremity Assessment: Generalized weakness LUE Deficits / Details: Left wrist fx from prior fall. Wears wrist splint for protection    Lower Extremity Assessment Lower  Extremity Assessment: Generalized weakness;RLE deficits/detail RLE Deficits / Details: Grossly decr AROM and strentght, limited by pain post fall RLE: Unable to fully assess due to pain    Cervical / Trunk Assessment Cervical / Trunk Assessment: Kyphotic  Communication   Communication: HOH  Cognition  Arousal/Alertness: Awake/alert Behavior During Therapy: WFL for tasks assessed/performed Overall Cognitive Status: Within Functional Limits for tasks assessed                                        General Comments General comments (skin integrity, edema, etc.): Son in law present throughout session    Exercises     Assessment/Plan    PT Assessment Patient needs continued PT services  PT Problem List Decreased strength;Decreased range of motion;Decreased activity tolerance;Decreased balance;Decreased mobility;Decreased coordination;Decreased knowledge of use of DME;Decreased safety awareness;Decreased knowledge of precautions;Pain       PT Treatment Interventions DME instruction;Gait training;Functional mobility training;Therapeutic activities;Therapeutic exercise;Balance training;Neuromuscular re-education;Cognitive remediation;Patient/family education    PT Goals (Current goals can be found in the Care Plan section)  Acute Rehab PT Goals Patient Stated Goal: "Walk again" PT Goal Formulation: With patient Time For Goal Achievement: 12/28/17 Potential to Achieve Goals: Good    Frequency Min 2X/week   Barriers to discharge        Co-evaluation PT/OT/SLP Co-Evaluation/Treatment: Yes Reason for Co-Treatment: For patient/therapist safety PT goals addressed during session: Mobility/safety with mobility OT goals addressed during session: ADL's and self-care       AM-PAC PT "6 Clicks" Daily Activity  Outcome Measure Difficulty turning over in bed (including adjusting bedclothes, sheets and blankets)?: Unable Difficulty moving from lying on back to sitting on the side of the bed? : A Lot Difficulty sitting down on and standing up from a chair with arms (e.g., wheelchair, bedside commode, etc,.)?: Unable Help needed moving to and from a bed to chair (including a wheelchair)?: Total Help needed walking in hospital room?: Total Help needed climbing 3-5 steps with  a railing? : Total 6 Click Score: 7    End of Session Equipment Utilized During Treatment: Gait belt Activity Tolerance: Patient tolerated treatment well Patient left: in chair;with call bell/phone within reach;with chair alarm set Nurse Communication: Mobility status PT Visit Diagnosis: Unsteadiness on feet (R26.81);Other abnormalities of gait and mobility (R26.89);Repeated falls (R29.6);Muscle weakness (generalized) (M62.81)    Time: 2751-7001 PT Time Calculation (min) (ACUTE ONLY): 22 min   Charges:   PT Evaluation $PT Eval Moderate Complexity: 1 Mod          Roney Marion, Ricardo Pager [phone] Office [phone]   [place]letta [place] 12/14/2017, 1:51 PM

## 2017-12-14 NOTE — Progress Notes (Signed)
[facility] PROGRESS NOTE  Ebonee Stober RKY:[id number] DOB: 11-Jan-1923 DOA: 12/13/2017 PCP: Marletta Lor, MD  Assessment/Plan:  Pelvic fracture and acetabular fracture, right s/p Fall - From review with daughter and patient, appears to be a mechanical fall.  No LOC or dizziness reported. Poor pain control. Evaluated by ortho who opine hip implants appear seated no acute loosening and recommends conservative managemtn -will schedule tylenol -change norco to 1 pill every 4 hours prn -continue robaxin -SNF for gradual progressive weight bearing right hip and pelvis -PT and social work have seen    Hyperthyroidism - She is on low dose methimazole, 3 times a week.  -TSH 1.5 10/28/17 - Continue home dose   HLD - Continue home pravastatin    History of meningioma - Prolonged course, completing a steroid taper now and on 1mg  of decadron for 2 more days per daughter.  -Monday 12/15/17 last dose    Takotsubo Syndrome (2008) and non obstructive CAD on LHC in 2008 - Reviewed chart and apparently she had an MI around the time of her meningioma and was felt to be this syndrome. She is on IMDUR low dose, plavix and pravastatin. -continue home med   Code Status: full Family Communication: daughter at bedside Disposition Plan: snf hopefully tomorrow   Consultants:  [facility]  Procedures:    Antibiotics:    HPI/Subjective: 84 yo admitted 12/13/17 right pelvis fractures after mechanical fall. Ortho on board. Lives at home with family. Oriented x3. Thin frail. Will need placement  Daughter reports poor pain control. PT eval complete. Ortho recommends gradual progression partial weight bearing.  snf when bed available  Objective: Vitals:   12/13/17 2032 12/14/17 0407  BP: (!) 147/60 (!) 145/78  Pulse: 66 80  Resp: 20 20  Temp: 97.9 F (36.6 C) 98.1 F (36.7 C)  SpO2: 99% 100%    Intake/Output Summary (Last 24 hours) at 12/14/2017 1452 Last data filed at  12/14/2017 0300 Gross per 24 hour  Intake 240 ml  Output 500 ml  Net -260 ml   Filed Weights   12/13/17 0758  Weight: 53.1 kg    Exam:   General:  Thin frail slightly lethargic no acute distress  Cardiovascular: RRR no mgr no LE edema  Respiratory: normal effort BS clear bilaterally no wheeze  Abdomen: flat soft +BS no guarding or rebounding  Musculoskeletal: decreased rom on right leg due to pain   Data Reviewed: Basic Metabolic Panel: Recent Labs  Lab 12/09/17 1406 12/13/17 0801 12/14/17 0340  NA 138 137 136  K 4.1 4.0 4.4  CL 104 108 105  CO2 24 21* 26  GLUCOSE 162* 131* 110*  BUN 33* 38* 27*  CREATININE 0.86 0.84 0.87  CALCIUM 9.0 8.5* 8.6*  MG 1.9  --   --    Liver Function Tests: Recent Labs  Lab 12/09/17 1406  AST 14  ALT 13  ALKPHOS 64  BILITOT 0.4  PROT 6.2  ALBUMIN 4.0   No results for input(s): LIPASE, AMYLASE in the last 168 hours. No results for input(s): AMMONIA in the last 168 hours. CBC: Recent Labs  Lab 12/09/17 1406 12/13/17 0801 12/14/17 0340  WBC 17.4* 17.3* 15.4*  NEUTROABS 15.8* 13.6*  --   HGB 12.5 11.2* 11.6*  HCT 38.1 35.9* 37.8  MCV 93.2 97.0 96.2  PLT 230.0 184 201   Cardiac Enzymes: No results for input(s): CKTOTAL, CKMB, CKMBINDEX, TROPONINI in the last 168 hours. BNP (last 3 results) No results for  input(s): BNP in the last 8760 hours.  ProBNP (last 3 results) No results for input(s): PROBNP in the last 8760 hours.  CBG: No results for input(s): GLUCAP in the last 168 hours.  No results found for this or any previous visit (from the past 240 hour(s)).   Studies: Ct Pelvis Wo Contrast  Result Date: 12/13/2017 CLINICAL DATA:  Fall at 1 a.m.  Right leg and hip pain. EXAM: CT PELVIS WITHOUT CONTRAST TECHNIQUE: Multidetector CT imaging of the pelvis was performed following the standard protocol without intravenous contrast. COMPARISON:  Pelvic CT from 09/13/2011 and pelvic radiographs 12/13/2017 FINDINGS:  Urinary Tract:  Unremarkable Bowel:  Sigmoid colon diverticulosis. Vascular/Lymphatic: Aortoiliac atherosclerotic vascular disease. Reproductive:  Uterus absent.  Adnexa unremarkable. Other:  No supplemental non-categorized findings. Musculoskeletal: Displaced fracture of the right inferior pubic ramus, 9 mm displacement observed. There is fracture of the right acetabulum involving the quadrilateral plate on image 60/6, and also extending into the anterior wall in the vicinity of a lucency adjacent to the hemiarthroplasty which may be from poly ethylene where. Chronic avulsion of the distal right piriformis muscle with calcification along its distal margin, and asymmetric atrophy of the muscle belly. No diastasis of the pubis or sacroiliac joints. I do not observe a well-defined sacral fracture, if a sacral fractures present it is occult by CT. Bony demineralization is present. Right hip hemiarthroplasty common no proximal femoral fracture but the distal stem of the arthroplasty is not fully included. Moderate to severe degenerative chondral thinning in the left hip joint with marginal spurring. No appreciable fracture at L4 or L5. Chronic atrophy of the gluteus medius and minimus muscles, left greater than right. IMPRESSION: 1. Fracture of the anterior wall of the right acetabulum through a lucency which may be from polyethylene particulate disease. This fracture also involves the quadrilateral plate of the right acetabulum, and there is a transverse mildly displaced fracture of the right inferior pubic ramus. 2. Chronic avulsion of the distal right piriformis muscle, with calcification along the distal tendon margin and asymmetric atrophy of the muscle belly. 3. Diffuse bony demineralization. 4. Moderate to severe degenerative arthropathy of the left hip. 5.  Aortic Atherosclerosis (ICD10-I70.0). 6. Sigmoid colon diverticulosis. Electronically Signed   By: Van Clines M.D.   On: 12/13/2017 10:29   Dg Hip  Unilat With Pelvis 2-3 Views Right  Result Date: 12/13/2017 CLINICAL DATA:  Fall this morning with right hip pain. EXAM: DG HIP (WITH OR WITHOUT PELVIS) 2-3V RIGHT COMPARISON:  05/07/2016 FINDINGS: There is moderate diffuse osteopenia. Right total hip arthroplasty is intact and normally located. There is a right inferior pubic ramus fracture which appears acute with minimal displacement. Mild degenerate change of the left hip and spine. IMPRESSION: Right inferior pubic ramus fracture with minimal displacement. Right total hip arthroplasty intact and normally located. Electronically Signed   By: Marin Olp M.D.   On: 12/13/2017 09:12    Scheduled Meds: . acetaminophen  650 mg Oral Q8H  . aspirin EC  325 mg Oral Daily  . clopidogrel  75 mg Oral Q breakfast  . dexamethasone  1 mg Oral Daily  . docusate sodium  100 mg Oral BID  . enoxaparin (LOVENOX) injection  40 mg Subcutaneous Q24H  . isosorbide mononitrate  15 mg Oral Daily  . [START ON 12/15/2017] methimazole  5 mg Oral Q M,W,F  . pravastatin  20 mg Oral QPM  . sodium chloride flush  3 mL Intravenous Q12H   Continuous Infusions: .  methocarbamol (ROBAXIN) IV      Principal Problem:   Pelvic fracture (HCC) Active Problems:   Fall   Essential hypertension   Osteoarthritis   Hyperthyroidism   GERD (gastroesophageal reflux disease)   History of meningioma    Time spent: 59 minutes    [place] NP [facility]  If 7PM-7AM, please contact night-coverage at [email or web address], password [facility] 12/14/2017, 2:52 PM  LOS: 0 days

## 2017-12-15 ENCOUNTER — Observation Stay (HOSPITAL_COMMUNITY): Payer: Medicare Other

## 2017-12-15 ENCOUNTER — Encounter (HOSPITAL_COMMUNITY): Payer: Self-pay | Admitting: *Deleted

## 2017-12-15 DIAGNOSIS — E0590 Thyrotoxicosis, unspecified without thyrotoxic crisis or storm: Secondary | ICD-10-CM

## 2017-12-15 DIAGNOSIS — S32401A Unspecified fracture of right acetabulum, initial encounter for closed fracture: Secondary | ICD-10-CM | POA: Diagnosis not present

## 2017-12-15 DIAGNOSIS — Z86018 Personal history of other benign neoplasm: Secondary | ICD-10-CM

## 2017-12-15 DIAGNOSIS — R0602 Shortness of breath: Secondary | ICD-10-CM | POA: Diagnosis not present

## 2017-12-15 DIAGNOSIS — W19XXXA Unspecified fall, initial encounter: Secondary | ICD-10-CM

## 2017-12-15 DIAGNOSIS — I10 Essential (primary) hypertension: Secondary | ICD-10-CM | POA: Diagnosis not present

## 2017-12-15 LAB — CBC
HCT: 36.8 % (ref 36.0–46.0)
HEMOGLOBIN: 12.1 g/dL (ref 12.0–15.0)
MCH: 31 pg (ref 26.0–34.0)
MCHC: 32.9 g/dL (ref 30.0–36.0)
MCV: 94.4 fL (ref 80.0–100.0)
NRBC: 0 % (ref 0.0–0.2)
Platelets: 193 10*3/uL (ref 150–400)
RBC: 3.9 MIL/uL (ref 3.87–5.11)
RDW: 14.7 % (ref 11.5–15.5)
WBC: 16.3 10*3/uL — AB (ref 4.0–10.5)

## 2017-12-15 LAB — BASIC METABOLIC PANEL
ANION GAP: 6 (ref 5–15)
BUN: 27 mg/dL — ABNORMAL HIGH (ref 8–23)
CALCIUM: 8.6 mg/dL — AB (ref 8.9–10.3)
CHLORIDE: 103 mmol/L (ref 98–111)
CO2: 24 mmol/L (ref 22–32)
Creatinine, Ser: 0.78 mg/dL (ref 0.44–1.00)
GFR calc non Af Amer: >60 mL/min (ref >60)
Glucose, Bld: 115 mg/dL — ABNORMAL HIGH (ref 70–99)
POTASSIUM: 4.3 mmol/L (ref 3.5–5.1)
Sodium: 133 mmol/L — ABNORMAL LOW (ref 135–145)

## 2017-12-15 LAB — VITAMIN D 25 HYDROXY (VIT D DEFICIENCY, FRACTURES): Vit D, 25-Hydroxy: 10 ng/mL — ABNORMAL LOW (ref 30.0–100.0)

## 2017-12-15 MED ORDER — VITAMIN D (ERGOCALCIFEROL) 1.25 MG (50000 UNIT) PO CAPS
50000.0000 [IU] | ORAL_CAPSULE | ORAL | Status: DC
  Administered 2017-12-15: 50000 [IU] via ORAL
  Filled 2017-12-15: qty 1

## 2017-12-15 MED ORDER — ACETAMINOPHEN 325 MG PO TABS
650.0000 mg | ORAL_TABLET | Freq: Four times a day (QID) | ORAL | Status: DC
  Administered 2017-12-15 – 2017-12-16 (×3): 650 mg via ORAL
  Filled 2017-12-15 (×3): qty 2

## 2017-12-15 MED ORDER — TRAMADOL HCL 50 MG PO TABS
50.0000 mg | ORAL_TABLET | Freq: Four times a day (QID) | ORAL | Status: DC | PRN
  Administered 2017-12-15 – 2017-12-16 (×3): 50 mg via ORAL
  Filled 2017-12-15 (×3): qty 1

## 2017-12-15 NOTE — Plan of Care (Signed)
  Problem: Education: Goal: Knowledge of General Education information will improve Description: Including pain rating scale, medication(s)/side effects and non-pharmacologic comfort measures Outcome: Progressing   Problem: Health Behavior/Discharge Planning: Goal: Ability to manage health-related needs will improve Outcome: Progressing   Problem: Clinical Measurements: Goal: Ability to maintain clinical measurements within normal limits will improve Outcome: Progressing Goal: Will remain free from infection Outcome: Progressing   Problem: Activity: Goal: Risk for activity intolerance will decrease Outcome: Progressing   Problem: Nutrition: Goal: Adequate nutrition will be maintained Outcome: Progressing   Problem: Elimination: Goal: Will not experience complications related to bowel motility Outcome: Progressing   Problem: Pain Managment: Goal: General experience of comfort will improve Outcome: Progressing   Problem: Safety: Goal: Ability to remain free from injury will improve Outcome: Progressing   Problem: Skin Integrity: Goal: Risk for impaired skin integrity will decrease Outcome: Progressing   

## 2017-12-15 NOTE — Progress Notes (Signed)
[facility] PROGRESS NOTE  [name] XVQ:[id number] DOB: Sep 02, 1922 DOA: 12/13/2017 PCP: Marletta Lor, MD  84 yo admitted 12/13/17 right pelvis fractures after mechanical fall. Ortho on board. Lives at home with family. Oriented x3.    Assessment/Plan:  Non-operative Pelvic fracture and acetabular fracture, right s/p Fall - mechanical fall  -pain control with scheduled tylenol and PRN ultram (norco appears to be making her very sedate but adjust as needed) -continue robaxin -SNF for gradual progressive weight bearing right hip and pelvis -PT and social work have seen-- await insurance authorization    Hyperthyroidism - She is on low dose methimazole, 3 times a week.  -TSH 1.5 10/28/17 - Continue home dose   HLD - continue home medications   Leukocytosis -was on decadron -no sign of PNA/UTI  Vit D def -replace  History of meningioma -recently hospitalized at [facility] -has finished decadron taper today   Takotsubo Syndrome (2008) and non obstructive CAD on LHC in 2008 - Reviewed chart and apparently she had an MI around the time of her meningioma and was felt to be this syndrome. She is on IMDUR low dose, plavix and pravastatin. -stop imdur for now-- HR <50  Code Status: full Family Communication: no family at bedside Disposition Plan: snf when insurance authorization obtained   Consultants:  ortho    HPI/Subjective: Sleepy but will awaken-- has not complaints    Objective: Vitals:   12/14/17 2133 12/15/17 0457  BP: (!) 156/90 (!) 162/75  Pulse: (!) 44 (!) 46  Resp: 15 14  Temp: (!) 97.5 F (36.4 C) 98.2 F (36.8 C)  SpO2: 98% 99%    Intake/Output Summary (Last 24 hours) at 12/15/2017 1143 Last data filed at 12/15/2017 0545 Gross per 24 hour  Intake 145 ml  Output 950 ml  Net -805 ml   Filed Weights   12/13/17 0758  Weight: 53.1 kg    Exam:   General:  Chronically ill appearing- sleeping but will awaken to  voice  Cardiovascular: rrr, no LE edema  Respiratory: coarse upper airway sounds-- unable to get patient to clear  Abdomen: +BS, soft   Data Reviewed: Basic Metabolic Panel: Recent Labs  Lab 12/09/17 1406 12/13/17 0801 12/14/17 0340 12/15/17 0220  NA 138 137 136 133*  K 4.1 4.0 4.4 4.3  CL 104 108 105 103  CO2 24 21* 26 24  GLUCOSE 162* 131* 110* 115*  BUN 33* 38* 27* 27*  CREATININE 0.86 0.84 0.87 0.78  CALCIUM 9.0 8.5* 8.6* 8.6*  MG 1.9  --   --   --    Liver Function Tests: Recent Labs  Lab 12/09/17 1406  AST 14  ALT 13  ALKPHOS 64  BILITOT 0.4  PROT 6.2  ALBUMIN 4.0   No results for input(s): LIPASE, AMYLASE in the last 168 hours. No results for input(s): AMMONIA in the last 168 hours. CBC: Recent Labs  Lab 12/09/17 1406 12/13/17 0801 12/14/17 0340 12/15/17 0220  WBC 17.4* 17.3* 15.4* 16.3*  NEUTROABS 15.8* 13.6*  --   --   HGB 12.5 11.2* 11.6* 12.1  HCT 38.1 35.9* 37.8 36.8  MCV 93.2 97.0 96.2 94.4  PLT 230.0 184 201 193   Cardiac Enzymes: No results for input(s): CKTOTAL, CKMB, CKMBINDEX, TROPONINI in the last 168 hours. BNP (last 3 results) No results for input(s): BNP in the last 8760 hours.  ProBNP (last 3 results) No results for input(s): PROBNP in the last 8760 hours.  CBG: No results  for input(s): GLUCAP in the last 168 hours.  No results found for this or any previous visit (from the past 240 hour(s)).   Studies: Dg Chest Port 1 View  Result Date: 12/15/2017 CLINICAL DATA:  Rapid breathing and shortness of breath this morning, history coronary artery disease post NSTEMI, hypertension, stroke EXAM: PORTABLE CHEST 1 VIEW COMPARISON:  Portable exam 0921 hours compared to 12/10/2017 FINDINGS: Enlargement of cardiac silhouette. Atherosclerotic calcification aorta. Mediastinal contours and pulmonary vascularity normal. Nodular density seen previously at the RIGHT apex is less well demonstrated on current exam. Lungs emphysematous with  minimal bibasilar atelectasis. No acute infiltrate, pleural effusion or pneumothorax. Bones demineralized. IMPRESSION: COPD changes with bibasilar atelectasis. RIGHT upper lobe nodular density seen previously less well demonstrated on current study. Electronically Signed   By: Lavonia Dana M.D.   On: 12/15/2017 09:30    Scheduled Meds: . acetaminophen  650 mg Oral Q8H  . aspirin EC  325 mg Oral Daily  . clopidogrel  75 mg Oral Q breakfast  . dexamethasone  1 mg Oral Daily  . docusate sodium  100 mg Oral BID  . enoxaparin (LOVENOX) injection  40 mg Subcutaneous Q24H  . isosorbide mononitrate  15 mg Oral Daily  . methimazole  5 mg Oral Q M,W,F  . pravastatin  20 mg Oral QPM  . sodium chloride flush  3 mL Intravenous Q12H   Continuous Infusions:   Principal Problem:   Pelvic fracture (HCC) Active Problems:   Essential hypertension   Osteoarthritis   Hyperthyroidism   GERD (gastroesophageal reflux disease)   History of meningioma   Fall    Time spent: 25 min    [facility]  If 7PM-7AM, please contact night-coverage at [email or web address], password [facility] 12/15/2017, 11:43 AM  LOS: 0 days

## 2017-12-15 NOTE — Plan of Care (Signed)
  Problem: Clinical Measurements: Goal: Ability to maintain clinical measurements within normal limits will improve Outcome: Progressing Goal: Will remain free from infection Outcome: Progressing Goal: Diagnostic test results will improve Outcome: Progressing Goal: Respiratory complications will improve Outcome: Progressing Goal: Cardiovascular complication will be avoided Outcome: Progressing   Problem: Coping: Goal: Level of anxiety will decrease Outcome: Progressing   Problem: Pain Managment: Goal: General experience of comfort will improve Outcome: Progressing   Problem: Safety: Goal: Ability to remain free from injury will improve Outcome: Progressing   

## 2017-12-15 NOTE — Progress Notes (Signed)
CSW spoke with patient daughter who reports top three SNF choices of [facility], [facility], and [facility] has made bed offer, CSW has faxed insurance authorization paperwork to [employer] who will start insurance auth today.   [place], [place]

## 2017-12-15 NOTE — Progress Notes (Signed)
     Subjective:   Awake, alert and oriented x3. Moderate pain.  Patient reports pain as moderate.    Objective:   VITALS:  Temp:  [97.5 F (36.4 C)-98.2 F (36.8 C)] 98.2 F (36.8 C) (11/11 0457) Pulse Rate:  [43-46] 46 (11/11 0457) Resp:  [14-15] 14 (11/11 0457) BP: (134-162)/(57-90) 162/75 (11/11 0457) SpO2:  [96 %-99 %] 99 % (11/11 0457)  Neurologically intact ABD soft Neurovascular intact Sensation intact distally Intact pulses distally Dorsiflexion/Plantar flexion intact   LABS Recent Labs    12/13/17 0801 12/14/17 0340 12/15/17 0220  HGB 11.2* 11.6* 12.1  WBC 17.3* 15.4* 16.3*  PLT 184 201 193   Recent Labs    12/14/17 0340 12/15/17 0220  NA 136 133*  K 4.4 4.3  CL 105 103  CO2 26 24  BUN 27* 27*  CREATININE 0.87 0.78  GLUCOSE 110* 115*   Recent Labs    12/14/17 0340  INR 0.96     Assessment/Plan:Right closed inferior pubic ramus fracture, right anterior column acetabular fracture right THR intact no loosening.      Advance diet Up with therapy Discharge to SNF for progressive ambulation and pain control.   Basil Dess 12/15/2017, 7:42 AMPatient ID: [name], female   DOB: 1922-11-10, 84 y.o.   MRN: [id number]

## 2017-12-16 DIAGNOSIS — S32401A Unspecified fracture of right acetabulum, initial encounter for closed fracture: Secondary | ICD-10-CM | POA: Diagnosis not present

## 2017-12-16 DIAGNOSIS — R41841 Cognitive communication deficit: Secondary | ICD-10-CM | POA: Diagnosis not present

## 2017-12-16 DIAGNOSIS — I69354 Hemiplegia and hemiparesis following cerebral infarction affecting left non-dominant side: Secondary | ICD-10-CM | POA: Diagnosis not present

## 2017-12-16 DIAGNOSIS — Z7401 Bed confinement status: Secondary | ICD-10-CM | POA: Diagnosis not present

## 2017-12-16 DIAGNOSIS — W19XXXD Unspecified fall, subsequent encounter: Secondary | ICD-10-CM | POA: Diagnosis not present

## 2017-12-16 DIAGNOSIS — Z9181 History of falling: Secondary | ICD-10-CM | POA: Diagnosis not present

## 2017-12-16 DIAGNOSIS — I5181 Takotsubo syndrome: Secondary | ICD-10-CM | POA: Diagnosis not present

## 2017-12-16 DIAGNOSIS — I69828 Other speech and language deficits following other cerebrovascular disease: Secondary | ICD-10-CM | POA: Diagnosis not present

## 2017-12-16 DIAGNOSIS — K5730 Diverticulosis of large intestine without perforation or abscess without bleeding: Secondary | ICD-10-CM | POA: Diagnosis not present

## 2017-12-16 DIAGNOSIS — E559 Vitamin D deficiency, unspecified: Secondary | ICD-10-CM | POA: Diagnosis not present

## 2017-12-16 DIAGNOSIS — I252 Old myocardial infarction: Secondary | ICD-10-CM | POA: Diagnosis not present

## 2017-12-16 DIAGNOSIS — I700 Atherosclerosis of aorta: Secondary | ICD-10-CM | POA: Diagnosis not present

## 2017-12-16 DIAGNOSIS — I10 Essential (primary) hypertension: Secondary | ICD-10-CM | POA: Diagnosis not present

## 2017-12-16 DIAGNOSIS — E785 Hyperlipidemia, unspecified: Secondary | ICD-10-CM | POA: Diagnosis not present

## 2017-12-16 DIAGNOSIS — E0590 Thyrotoxicosis, unspecified without thyrotoxic crisis or storm: Secondary | ICD-10-CM | POA: Diagnosis not present

## 2017-12-16 DIAGNOSIS — Z7189 Other specified counseling: Secondary | ICD-10-CM | POA: Diagnosis not present

## 2017-12-16 DIAGNOSIS — D649 Anemia, unspecified: Secondary | ICD-10-CM | POA: Diagnosis not present

## 2017-12-16 DIAGNOSIS — R600 Localized edema: Secondary | ICD-10-CM | POA: Diagnosis not present

## 2017-12-16 DIAGNOSIS — Z86018 Personal history of other benign neoplasm: Secondary | ICD-10-CM | POA: Diagnosis not present

## 2017-12-16 DIAGNOSIS — S32401D Unspecified fracture of right acetabulum, subsequent encounter for fracture with routine healing: Secondary | ICD-10-CM | POA: Diagnosis not present

## 2017-12-16 DIAGNOSIS — I2510 Atherosclerotic heart disease of native coronary artery without angina pectoris: Secondary | ICD-10-CM | POA: Diagnosis not present

## 2017-12-16 DIAGNOSIS — E43 Unspecified severe protein-calorie malnutrition: Secondary | ICD-10-CM | POA: Diagnosis not present

## 2017-12-16 DIAGNOSIS — M6281 Muscle weakness (generalized): Secondary | ICD-10-CM | POA: Diagnosis not present

## 2017-12-16 DIAGNOSIS — R1312 Dysphagia, oropharyngeal phase: Secondary | ICD-10-CM | POA: Diagnosis not present

## 2017-12-16 DIAGNOSIS — M8580 Other specified disorders of bone density and structure, unspecified site: Secondary | ICD-10-CM | POA: Diagnosis not present

## 2017-12-16 DIAGNOSIS — R2242 Localized swelling, mass and lump, left lower limb: Secondary | ICD-10-CM | POA: Diagnosis not present

## 2017-12-16 DIAGNOSIS — D72829 Elevated white blood cell count, unspecified: Secondary | ICD-10-CM | POA: Diagnosis not present

## 2017-12-16 DIAGNOSIS — I69391 Dysphagia following cerebral infarction: Secondary | ICD-10-CM | POA: Diagnosis not present

## 2017-12-16 DIAGNOSIS — R05 Cough: Secondary | ICD-10-CM | POA: Diagnosis not present

## 2017-12-16 DIAGNOSIS — S32591D Other specified fracture of right pubis, subsequent encounter for fracture with routine healing: Secondary | ICD-10-CM | POA: Diagnosis not present

## 2017-12-16 DIAGNOSIS — J449 Chronic obstructive pulmonary disease, unspecified: Secondary | ICD-10-CM | POA: Diagnosis not present

## 2017-12-16 DIAGNOSIS — S32511D Fracture of superior rim of right pubis, subsequent encounter for fracture with routine healing: Secondary | ICD-10-CM | POA: Diagnosis not present

## 2017-12-16 DIAGNOSIS — S32434D Nondisplaced fracture of anterior column [iliopubic] of right acetabulum, subsequent encounter for fracture with routine healing: Secondary | ICD-10-CM | POA: Diagnosis not present

## 2017-12-16 DIAGNOSIS — M79605 Pain in left leg: Secondary | ICD-10-CM | POA: Diagnosis not present

## 2017-12-16 DIAGNOSIS — E039 Hypothyroidism, unspecified: Secondary | ICD-10-CM | POA: Diagnosis not present

## 2017-12-16 DIAGNOSIS — S32434A Nondisplaced fracture of anterior column [iliopubic] of right acetabulum, initial encounter for closed fracture: Secondary | ICD-10-CM | POA: Diagnosis not present

## 2017-12-16 DIAGNOSIS — R2681 Unsteadiness on feet: Secondary | ICD-10-CM | POA: Diagnosis not present

## 2017-12-16 DIAGNOSIS — M2550 Pain in unspecified joint: Secondary | ICD-10-CM | POA: Diagnosis not present

## 2017-12-16 DIAGNOSIS — R001 Bradycardia, unspecified: Secondary | ICD-10-CM | POA: Diagnosis not present

## 2017-12-16 DIAGNOSIS — M25551 Pain in right hip: Secondary | ICD-10-CM | POA: Diagnosis not present

## 2017-12-16 DIAGNOSIS — R0600 Dyspnea, unspecified: Secondary | ICD-10-CM | POA: Diagnosis not present

## 2017-12-16 DIAGNOSIS — S32591A Other specified fracture of right pubis, initial encounter for closed fracture: Secondary | ICD-10-CM | POA: Diagnosis not present

## 2017-12-16 DIAGNOSIS — K589 Irritable bowel syndrome without diarrhea: Secondary | ICD-10-CM | POA: Diagnosis not present

## 2017-12-16 MED ORDER — SENNA 8.6 MG PO TABS: 1.0000 | ORAL_TABLET | Freq: Every day | ORAL | 0 refills | Status: DC

## 2017-12-16 MED ORDER — BISACODYL 10 MG RE SUPP: 10.0000 mg | Freq: Every day | RECTAL | 0 refills | Status: AC | PRN

## 2017-12-16 MED ORDER — METHOCARBAMOL 500 MG PO TABS: 250.0000 mg | ORAL_TABLET | Freq: Four times a day (QID) | ORAL | 0 refills | Status: DC | PRN

## 2017-12-16 MED ORDER — TRAMADOL HCL 50 MG PO TABS: 50.0000 mg | ORAL_TABLET | Freq: Four times a day (QID) | ORAL | 0 refills | Status: DC | PRN

## 2017-12-16 MED ORDER — DOCUSATE SODIUM 100 MG PO CAPS: 100.0000 mg | ORAL_CAPSULE | Freq: Two times a day (BID) | ORAL | 0 refills | Status: DC

## 2017-12-16 MED ORDER — VITAMIN D (ERGOCALCIFEROL) 1.25 MG (50000 UNIT) PO CAPS: 50000.0000 [IU] | ORAL_CAPSULE | ORAL | Status: DC

## 2017-12-16 MED ORDER — BISACODYL 10 MG RE SUPP
10.0000 mg | Freq: Every day | RECTAL | Status: DC | PRN
  Administered 2017-12-16: 10 mg via RECTAL
  Filled 2017-12-16: qty 1

## 2017-12-16 MED ORDER — SENNA 8.6 MG PO TABS: 1.0000 | ORAL_TABLET | Freq: Every day | ORAL | Status: DC

## 2017-12-16 MED ORDER — ACETAMINOPHEN 325 MG PO TABS: 650.0000 mg | ORAL_TABLET | Freq: Four times a day (QID) | ORAL | Status: DC

## 2017-12-16 NOTE — Plan of Care (Signed)

## 2017-12-16 NOTE — Progress Notes (Signed)
Pt alert, oriented to self. No acute distress. Noted to be retaining urine. Bladder scan completed and it was noted that she had +999 in her bladder. Pt was in and out cathed and I obtained 1100 ml clear, yellow urine.  Pt was administered a suppository per MD order. She was starting to have some results in the form of a smear of stool on her bed pad and in the gluteal cleft. Dtr present for the bath and I+O cath. Educated dtr on what we were doing, and answered any questions she had. Continue to monitor.

## 2017-12-16 NOTE — Progress Notes (Addendum)
Patient will DC to: [facility] Anticipated DC date: 12/16/17 Family notified:daughter [name] at bedside Transport by: Corey Harold  Per MD patient ready for DC to [facility] . RN, patient, patient's family, and facility notified of DC. Discharge Summary sent to facility. RN given number for report [phone]. DC packet on chart. Ambulance transport requested for 12:00 noon for patient.  CSW signing off.  [place], [place]

## 2017-12-16 NOTE — Clinical Social Work Placement (Signed)
   CLINICAL SOCIAL WORK PLACEMENT  NOTE  Date:  12/16/2017  Patient Details  Name: [name] MRN: [id number] Date of Birth: 02-Dec-1922  Clinical Social Work is seeking post-discharge placement for this patient at the [place] level of care (*CSW will initial, date and re-position this form in  chart as items are completed):  Yes   Patient/family provided with [place] Work Department's list of facilities offering this level of care within the geographic area requested by the patient (or if unable, by the patient's family).  Yes   Patient/family informed of their freedom to choose among providers that offer the needed level of care, that participate in Medicare, Medicaid or managed care program needed by the patient, have an available bed and are willing to accept the patient.      Patient/family informed of [facility]'s ownership interest in [facility] and [facility], as well as of the fact that they are under no obligation to receive care at these facilities.  PASRR submitted to EDS on       PASRR number received on 12/14/17     Existing PASRR number confirmed on       FL2 transmitted to all facilities in geographic area requested by pt/family on 12/15/17     FL2 transmitted to all facilities within larger geographic area on       Patient informed that his/her managed care company has contracts with or will negotiate with certain facilities, including the following:        Yes   Patient/family informed of bed offers received.  Patient chooses bed at [facility]     Physician recommends and patient chooses bed at      Patient to be transferred to [facility] on 12/16/17.  Patient to be transferred to facility by [facility]     Patient family notified on 12/16/17 of transfer.  Name of family member notified:  [name] (daughter in room)     PHYSICIAN       Additional Comment:     _______________________________________________ Alberteen Sam, LCSW 12/16/2017, 10:33 AM

## 2017-12-16 NOTE — Progress Notes (Signed)
Patient ID: [name], female   DOB: [birth year], 84 y.o.   MRN: [id number]     Subjective: Awake, alert and orient x 3. Foley due to urinary retention probable secondary to pelvis fracture and  Pelvic floor spasm. intermittant cath or indwelling foley recommended.   Patient reports pain as moderate.    Objective:   VITALS:  Temp:  [97.6 F (36.4 C)-98 F (36.7 C)] 98 F (36.7 C) (11/12 0528) Pulse Rate:  [51-81] 51 (11/12 0528) Resp:  [14-18] 14 (11/12 0528) BP: (113-154)/(62-73) 151/73 (11/12 0528) SpO2:  [96 %-98 %] 98 % (11/12 0528)  Neurologically intact ABD soft Neurovascular intact Sensation intact distally Intact pulses distally Dorsiflexion/Plantar flexion intact Compartment soft   LABS Recent Labs    12/14/17 0340 12/15/17 0220  HGB 11.6* 12.1  WBC 15.4* 16.3*  PLT 201 193   Recent Labs    12/14/17 0340 12/15/17 0220  NA 136 133*  K 4.4 4.3  CL 105 103  CO2 26 24  BUN 27* 27*  CREATININE 0.87 0.78  GLUCOSE 110* 115*   Recent Labs    12/14/17 0340  INR 0.96     Assessment/Plan:     Advance diet Up with therapy Discharge to SNF  Urinary retention usually requires foley to AD until she is off pain  meds and pain due to pelvic fracture is improved. Usually 2 weeks.  I will see her in follow up in 2 weeks.   Basil Dess 12/16/2017, 12:24 PM

## 2017-12-16 NOTE — Discharge Summary (Signed)
Physician Discharge Summary  [name] ZES:[id number] DOB: 16-Apr-1922 DOA: 12/13/2017  PCP: Marletta Lor, MD  Admit date: 12/13/2017 Discharge date: 12/16/2017  Admitted From: home Discharge disposition: SNF   Recommendations for Outpatient Follow-Up:   ambulation with walker partial weight bearing on the left for 3-4 weeks. Bowel regimen Cbc/BMP 1 week   Discharge Diagnosis:   Principal Problem:   Pelvic fracture (HCC) Active Problems:   Essential hypertension   Osteoarthritis   Hyperthyroidism   GERD (gastroesophageal reflux disease)   History of meningioma   Fall    Discharge Condition: Improved.  Diet recommendation:Regular.  Wound care: None.  Code status: Full.   History of Present Illness:  [name] is a 84 y.o. female with medical history significant of stroke, OA, IBS, HTN, CAD, anemia, hyperthyroidism and most recently meningioma s/p gamma knife surgery at [facility] with brain swelling afterwards which led her to be on decadron (this is a chronic issue, initial surgery in 2008, recurrence in 2010 and gamma knife on 07/24/2017)   She presents today after a fall in the early hours of the morning which led to severe right hip pain and inability to walk.  She has a hip replacement in that hip.  She had imaging which showed a pelvic fracture and right acetabular fracture.  She has no other symptoms currently, denies chest pain, fever, chills, recent illness (beyond the meningioma treatment), urinary problems, changes to her bowels, nausea, vomiting.  She has been gaining weight with the steroids, which she does not like.  She was recently seen by Dr. Elease Hashimoto in [facility] for dizziness which was felt to be vertigo.     Hospital Course by Problem:   Non-operative Pelvic fractureand acetabular fracture, right s/pFall - mechanical fall  -pain control with scheduled tylenol and PRN ultram (norco appears to be making her very sedated but adjust as  needed) -continue robaxin but at a lower dose as she was becoming sedate -SNF for gradual progressive weight bearing right hip and pelvis  Hyperthyroidism - She is on low dose methimazole, 3 times a week.  -TSH 1.5 10/28/17 - Continue home dose  HLD - continue home medications  Leukocytosis -was on decadron -no sign of PNA/UTI  Vit D def -replace  History of meningioma -recently hospitalized at [facility] -has finished decadron taper today   Takotsubo Syndrome(2008) and non obstructive CAD on LHC in 2008 - Reviewed chart and apparently she had an MI around the time of her meningioma and was felt to be this syndrome. She is on IMDUR low dose, plavix and pravastatin. -stop imdur for now-- HR <50    Medical Consultants:   Orthopedics Louanne Skye)   Discharge Exam:   Vitals:   12/15/17 1928 12/16/17 0528  BP: (!) 154/62 (!) 151/73  Pulse: 63 (!) 51  Resp: 14 14  Temp: 97.8 F (36.6 C) 98 F (36.7 C)  SpO2: 96% 98%   Vitals:   12/15/17 0457 12/15/17 1318 12/15/17 1928 12/16/17 0528  BP: (!) 162/75 113/66 (!) 154/62 (!) 151/73  Pulse: (!) 46 81 63 (!) 51  Resp: 14 18 14 14   Temp: 98.2 F (36.8 C) 97.6 F (36.4 C) 97.8 F (36.6 C) 98 F (36.7 C)  TempSrc: Axillary Oral Oral Oral  SpO2: 99% 98% 96% 98%  Weight:      Height:        General exam: Appears calm and comfortable.   The results of significant diagnostics from  this hospitalization (including imaging, microbiology, ancillary and laboratory) are listed below for reference.     Procedures and Diagnostic Studies:   Ct Pelvis Wo Contrast  Result Date: 12/13/2017 CLINICAL DATA:  Fall at 1 a.m.  Right leg and hip pain. EXAM: CT PELVIS WITHOUT CONTRAST TECHNIQUE: Multidetector CT imaging of the pelvis was performed following the standard protocol without intravenous contrast. COMPARISON:  Pelvic CT from 09/13/2011 and pelvic radiographs 12/13/2017 FINDINGS: Urinary Tract:  Unremarkable Bowel:   Sigmoid colon diverticulosis. Vascular/Lymphatic: Aortoiliac atherosclerotic vascular disease. Reproductive:  Uterus absent.  Adnexa unremarkable. Other:  No supplemental non-categorized findings. Musculoskeletal: Displaced fracture of the right inferior pubic ramus, 9 mm displacement observed. There is fracture of the right acetabulum involving the quadrilateral plate on image 40/3, and also extending into the anterior wall in the vicinity of a lucency adjacent to the hemiarthroplasty which may be from poly ethylene where. Chronic avulsion of the distal right piriformis muscle with calcification along its distal margin, and asymmetric atrophy of the muscle belly. No diastasis of the pubis or sacroiliac joints. I do not observe a well-defined sacral fracture, if a sacral fractures present it is occult by CT. Bony demineralization is present. Right hip hemiarthroplasty common no proximal femoral fracture but the distal stem of the arthroplasty is not fully included. Moderate to severe degenerative chondral thinning in the left hip joint with marginal spurring. No appreciable fracture at L4 or L5. Chronic atrophy of the gluteus medius and minimus muscles, left greater than right. IMPRESSION: 1. Fracture of the anterior wall of the right acetabulum through a lucency which may be from polyethylene particulate disease. This fracture also involves the quadrilateral plate of the right acetabulum, and there is a transverse mildly displaced fracture of the right inferior pubic ramus. 2. Chronic avulsion of the distal right piriformis muscle, with calcification along the distal tendon margin and asymmetric atrophy of the muscle belly. 3. Diffuse bony demineralization. 4. Moderate to severe degenerative arthropathy of the left hip. 5.  Aortic Atherosclerosis (ICD10-I70.0). 6. Sigmoid colon diverticulosis. Electronically Signed   By: Van Clines M.D.   On: 12/13/2017 10:29   Dg Hip Unilat With Pelvis 2-3 Views  Right  Result Date: 12/13/2017 CLINICAL DATA:  Fall this morning with right hip pain. EXAM: DG HIP (WITH OR WITHOUT PELVIS) 2-3V RIGHT COMPARISON:  05/07/2016 FINDINGS: There is moderate diffuse osteopenia. Right total hip arthroplasty is intact and normally located. There is a right inferior pubic ramus fracture which appears acute with minimal displacement. Mild degenerate change of the left hip and spine. IMPRESSION: Right inferior pubic ramus fracture with minimal displacement. Right total hip arthroplasty intact and normally located. Electronically Signed   By: Marin Olp M.D.   On: 12/13/2017 09:12     Labs:   Basic Metabolic Panel: Recent Labs  Lab 12/09/17 1406 12/13/17 0801 12/14/17 0340 12/15/17 0220  NA 138 137 136 133*  K 4.1 4.0 4.4 4.3  CL 104 108 105 103  CO2 24 21* 26 24  GLUCOSE 162* 131* 110* 115*  BUN 33* 38* 27* 27*  CREATININE 0.86 0.84 0.87 0.78  CALCIUM 9.0 8.5* 8.6* 8.6*  MG 1.9  --   --   --    GFR Estimated Creatinine Clearance: 36.8 mL/min (by C-G formula based on SCr of 0.78 mg/dL). Liver Function Tests: Recent Labs  Lab 12/09/17 1406  AST 14  ALT 13  ALKPHOS 64  BILITOT 0.4  PROT 6.2  ALBUMIN 4.0   No  results for input(s): LIPASE, AMYLASE in the last 168 hours. No results for input(s): AMMONIA in the last 168 hours. Coagulation profile Recent Labs  Lab 12/14/17 0340  INR 0.96    CBC: Recent Labs  Lab 12/09/17 1406 12/13/17 0801 12/14/17 0340 12/15/17 0220  WBC 17.4* 17.3* 15.4* 16.3*  NEUTROABS 15.8* 13.6*  --   --   HGB 12.5 11.2* 11.6* 12.1  HCT 38.1 35.9* 37.8 36.8  MCV 93.2 97.0 96.2 94.4  PLT 230.0 184 201 193   Cardiac Enzymes: No results for input(s): CKTOTAL, CKMB, CKMBINDEX, TROPONINI in the last 168 hours. BNP: Invalid input(s): POCBNP CBG: No results for input(s): GLUCAP in the last 168 hours. D-Dimer No results for input(s): DDIMER in the last 72 hours. Hgb A1c No results for input(s): HGBA1C in the last  72 hours. Lipid Profile No results for input(s): CHOL, HDL, LDLCALC, TRIG, CHOLHDL, LDLDIRECT in the last 72 hours. Thyroid function studies No results for input(s): TSH, T4TOTAL, T3FREE, THYROIDAB in the last 72 hours.  Invalid input(s): FREET3 Anemia work up No results for input(s): VITAMINB12, FOLATE, FERRITIN, TIBC, IRON, RETICCTPCT in the last 72 hours. Microbiology No results found for this or any previous visit (from the past 240 hour(s)).   Discharge Instructions:   Discharge Instructions    Diet general   Complete by:  As directed    Discharge instructions   Complete by:  As directed    SNF placement for gradual progressive weight bearing right hip and pelvis.  Bowel regimen     Allergies as of 12/16/2017      Reactions   Atorvastatin Other (See Comments)   Raises liver enzymes (takes pravastatin at home)   Fluorometholone    Falling down   Influenza Vaccines Other (See Comments)   "dizziness"   Phenytoin Other (See Comments)   Blood clots   Promethazine Hcl Other (See Comments)   Severe sedation   Sulfamethoxazole    REACTION: unspecified Per pt daughter she stated this causes rash      Medication List    STOP taking these medications   dexamethasone 2 MG tablet Commonly known as:  DECADRON   feeding supplement (ENSURE ENLIVE) Liqd   isosorbide mononitrate 30 MG 24 hr tablet Commonly known as:  IMDUR   polyethylene glycol packet Commonly known as:  MIRALAX / GLYCOLAX     TAKE these medications   acetaminophen 325 MG tablet Commonly known as:  TYLENOL Take 2 tablets (650 mg total) by mouth every 6 (six) hours. What changed:    when to take this  reasons to take this   bisacodyl 10 MG suppository Commonly known as:  DULCOLAX Place 1 suppository (10 mg total) rectally daily as needed for moderate constipation.   clopidogrel 75 MG tablet Commonly known as:  PLAVIX TAKE 1 TABLET DAILY WITH BREAKFAST   cyanocobalamin 1000 MCG/ML  injection Commonly known as:  (VITAMIN B-12) INJECT 1 ML (1000 MCG TOTAL) INTRAMUSCULARLY EVERY 30 DAYS What changed:  See the new instructions.   cycloSPORINE 0.05 % ophthalmic emulsion Commonly known as:  RESTASIS Place 1 drop into both eyes at bedtime as needed (dry eyes).   docusate sodium 100 MG capsule Commonly known as:  COLACE Take 1 capsule (100 mg total) by mouth 2 (two) times daily.   Melatonin 1 MG Tabs Take 1 mg by mouth at bedtime as needed (for sleep).   methimazole 5 MG tablet Commonly known as:  TAPAZOLE TAKE 1 TABLET (5 MG  TOTALLY) THREE TIMES A WEEK, MON, WED, AND FRI   methocarbamol 500 MG tablet Commonly known as:  ROBAXIN Take 0.5 tablets (250 mg total) by mouth every 6 (six) hours as needed for muscle spasms.   NEEDLE (DISP) 25 G 25G X 5/8" Misc USE ONCE A MONTH FOR B12 INJECTIONS AS DIRECTED   nitroGLYCERIN 0.4 MG SL tablet Commonly known as:  NITROSTAT Place 1 tablet (0.4 mg total) under the tongue every 5 (five) minutes as needed. For chest pain.   pravastatin 20 MG tablet Commonly known as:  PRAVACHOL TAKE 1 TABLET DAILY IN THE EVENING   PREMARIN vaginal cream Generic drug:  conjugated estrogens USE 0.5 GRAM VAGINALLY TWICE A WEEK AS NEEDED What changed:  See the new instructions.   senna 8.6 MG Tabs tablet Commonly known as:  SENOKOT Take 1 tablet (8.6 mg total) by mouth at bedtime.   Syringe (Disposable) 3 ML Misc USE AS DIRECTED ONCE A MONTH FOR B12 INJECTIONS   traMADol 50 MG tablet Commonly known as:  ULTRAM Take 1 tablet (50 mg total) by mouth every 6 (six) hours as needed for severe pain.   Vitamin D (Ergocalciferol) 1.25 MG (50000 UT) Caps capsule Commonly known as:  DRISDOL Take 1 capsule (50,000 Units total) by mouth every 7 (seven) days. Start taking on:  12/22/2017       Contact information for follow-up providers    Marletta Lor, MD Follow up in 1 week(s).   Specialty:  Internal Medicine Contact  information: [place]  [phone]            Contact information for after-discharge care    Destination    [facility] Preferred SNF .   Service:  Skilled Nursing Contact information: [address] [place] [phone]                   Time coordinating discharge: 25 min  Signed:  Geradine Girt DO  [facility] 12/16/2017, 9:44 AM

## 2017-12-16 NOTE — Care Management Note (Signed)
Case Management Note  Patient Details  Name: [name] MRN: [id number] Date of Birth: [birth year]                  Action/Plan: Made [facility] aware pt was dc to SNF. CSW following for SNF rehab placement.   Expected Discharge Date:  12/16/17               Expected Discharge Plan:  [place]  In-House Referral:  Clinical Social Work  Discharge planning Services  CM Consult  Post Acute Care Choice:  NA Choice offered to:  NA  DME Arranged:  N/A DME Agency:  NA  HH Arranged:  NA HH Agency:  NA  Status of Service:  Completed, signed off  If discussed at [employer] of Stay Meetings, dates discussed:    Additional Comments:  Erenest Rasher, RN 12/16/2017, 10:32 AM

## 2017-12-16 NOTE — Progress Notes (Signed)
Report called to [employer] [employer]. Pt going to room 505. Pt left floor with [facility] and will be transported via ambulance. All personal possessions taken with dtr upon departure. Hard scripts given to EMS for SNF.

## 2017-12-17 ENCOUNTER — Non-Acute Institutional Stay: Payer: Medicare Other | Admitting: Internal Medicine

## 2017-12-17 ENCOUNTER — Encounter: Payer: Self-pay | Admitting: Internal Medicine

## 2017-12-17 DIAGNOSIS — W19XXXD Unspecified fall, subsequent encounter: Secondary | ICD-10-CM

## 2017-12-17 DIAGNOSIS — E0590 Thyrotoxicosis, unspecified without thyrotoxic crisis or storm: Secondary | ICD-10-CM | POA: Diagnosis not present

## 2017-12-17 DIAGNOSIS — S32434D Nondisplaced fracture of anterior column [iliopubic] of right acetabulum, subsequent encounter for fracture with routine healing: Secondary | ICD-10-CM

## 2017-12-17 DIAGNOSIS — Z86018 Personal history of other benign neoplasm: Secondary | ICD-10-CM

## 2017-12-17 DIAGNOSIS — E785 Hyperlipidemia, unspecified: Secondary | ICD-10-CM

## 2017-12-17 DIAGNOSIS — I5181 Takotsubo syndrome: Secondary | ICD-10-CM

## 2017-12-17 NOTE — Progress Notes (Signed)
:  Location:  [place] Room Number: [phone] Place of Service:  SNF (31)  Coryn Mosso D. [name], MD  Patient Care Team: Marletta Lor, MD as PCP - General Dory Horn, MD as Rounding Team (Internal Medicine) Jacolyn Reedy, MD as Consulting Physician (Cardiology)  Extended Emergency Contact Information Primary Emergency Contact: Warnell Bureau Address: [address]           [place], [place] [address] [name] of [place] Phone: [phone] Work Phone: [phone] Mobile Phone: [phone] Relation: Daughter     Allergies: Atorvastatin; Fluorometholone; Influenza vaccines; Phenytoin; Promethazine hcl; and Sulfamethoxazole  Chief Complaint  Patient presents with  . New Admit To SNF    Admit to [facility]    HPI: Patient is 84 y.o. female with stroke, OA, IBS, hypertension, CAD, anemia, hypothyroidism, and most recently meningioma status post gamma knife surgery at [facility] with brain swelling after which is led her to be on Decadron which she has been on chronically since initial surgery in 2008, recurrence in 2010 and gamma knife on 07/24/2017.  She presented to the ED on day of admission after a fall in the early hours of the morning which really led to right hip pain and inability to walk.  She has no other symptoms currently, no chest pain fever chills recent illness, urinary problems change in her bowels, nausea vomiting.  She has been gaining weight on steroids which she does not like she is recently been seen by her family practitioner for dizziness which is felt to be vertigo.  Imaging showed a pelvic fracture and a right acetabular fracture.  Patient was admitted to [facility] from 11/9-12 her fracture is to be treated nonoperatively.  There were no complications.  Patient is admitted to skilled nursing facility for OT/PT.  While at skilled nursing facility patient will be followed for hyper thyroidism treated with  methimazole, vitamin D deficiency treated with replacement and Takotsubo syndrome and nonobstructive CAD on LHC in 2008 treated with  Plavix, and pravastatin  Past Medical History:  Diagnosis Date  . Abnormal CT of the chest    Stable patchy/nodular and ground-glass opacities in the right upper lobe, While grossly unchanged, low grade adenocarcinoma remains possible.  [facility] ANEMIA, PERNICIOUS 09/10/2006  . Bradycardia    August, 2013  . CORONARY ARTERY DISEASE 11/07/2008   a. NSTEMI 2001 & STEMI 2008 managed medically. b. Takotsubo 06/2006. c. Cath 2009 -> med rx.  . CVA WITH LEFT HEMIPARESIS 07/22/2007   May, 2009, neurology decided aspirin and Plavix at that time.  . DERMATITIS 03/02/2007  . DJD (degenerative joint disease)    L shoulder impingement  . Ejection fraction    EF 60%, echo, July, 2010, (  EF improved after tach a suitable event 2008)  . Family history of anesthesia complication    daughter has a hard time waking up  . H/O hiatal hernia   . HYPERTENSION 07/15/2006  . IBS (irritable bowel syndrome)   . MENINGIOMA 08/27/2006   Occipital meningioma, surgery, [facility], 2008 /  MRI, [facility], June, 2011, no change in lesion, history believe the patient had a gamma knife treatment of a right occipital atypical meningioma  . OSTEOARTHRITIS 07/15/2006  . Pelvic fracture ([place])   . PULMONARY NODULE 01/14/2007   [facility], December, 2011  . Stroke ([place])   . Subarachnoid hemorrhage following injury ([place]) 2007   Subarachnoid hemorrhage secondary to a fall December, 2007  . Takotsubo syndrome 04/22/2008  MI, May, 2008, question takotsubo event  . THYROID NODULE 01/14/2007    Past Surgical History:  Procedure Laterality Date  . ABDOMINAL HYSTERECTOMY    . BRAIN TUMOR EXCISION     Meningioma; treated at [facility] in 2008  . CHOLECYSTECTOMY    . JOINT REPLACEMENT     rt THR  . SHOULDER SURGERY      Allergies as of 12/17/2017      Reactions   Atorvastatin Other (See Comments)   Raises  liver enzymes (takes pravastatin at home)   Fluorometholone    Falling down   Influenza Vaccines Other (See Comments)   "dizziness"   Phenytoin Other (See Comments)   Blood clots   Promethazine Hcl Other (See Comments)   Severe sedation   Sulfamethoxazole    REACTION: unspecified Per pt daughter she stated this causes rash      Medication List        Accurate as of 12/17/17 11:04 AM. Always use your most recent med list.          acetaminophen 325 MG tablet Commonly known as:  TYLENOL Take 2 tablets (650 mg total) by mouth every 6 (six) hours.   bisacodyl 10 MG suppository Commonly known as:  DULCOLAX Place 1 suppository (10 mg total) rectally daily as needed for moderate constipation.   clopidogrel 75 MG tablet Commonly known as:  PLAVIX TAKE 1 TABLET DAILY WITH BREAKFAST   cyanocobalamin 1000 MCG/ML injection Commonly known as:  (VITAMIN B-12) INJECT 1 ML (1000 MCG TOTAL) INTRAMUSCULARLY EVERY 30 DAYS   docusate sodium 100 MG capsule Commonly known as:  COLACE Take 1 capsule (100 mg total) by mouth 2 (two) times daily.   Melatonin 1 MG Tabs Take 1 mg by mouth at bedtime as needed (for sleep).   methimazole 5 MG tablet Commonly known as:  TAPAZOLE TAKE 1 TABLET (5 MG TOTALLY) THREE TIMES A WEEK, MON, WED, AND FRI   methocarbamol 500 MG tablet Commonly known as:  ROBAXIN Take 0.5 tablets (250 mg total) by mouth every 6 (six) hours as needed for muscle spasms.   NEEDLE (DISP) 25 G 25G X 5/8" Misc USE ONCE A MONTH FOR B12 INJECTIONS AS DIRECTED   nitroGLYCERIN 0.4 MG SL tablet Commonly known as:  NITROSTAT Place 1 tablet (0.4 mg total) under the tongue every 5 (five) minutes as needed. For chest pain.   pravastatin 20 MG tablet Commonly known as:  PRAVACHOL TAKE 1 TABLET DAILY IN THE EVENING   senna 8.6 MG Tabs tablet Commonly known as:  SENOKOT Take 1 tablet (8.6 mg total) by mouth at bedtime.   Syringe (Disposable) 3 ML Misc USE AS DIRECTED ONCE A  MONTH FOR B12 INJECTIONS   traMADol 50 MG tablet Commonly known as:  ULTRAM Take 1 tablet (50 mg total) by mouth every 6 (six) hours as needed for severe pain.   Vitamin D (Ergocalciferol) 1.25 MG (50000 UT) Caps capsule Commonly known as:  DRISDOL Take 1 capsule (50,000 Units total) by mouth every 7 (seven) days. Start taking on:  12/22/2017       No orders of the defined types were placed in this encounter.   Immunization History  Administered Date(s) Administered  . Pneumococcal Conjugate-13 04/18/2014  . Pneumococcal Polysaccharide-23 10/19/2012  . Tetanus 10/19/2012    Social History   Tobacco Use  . Smoking status: Never Smoker  . Smokeless tobacco: Never Used  Substance Use Topics  . Alcohol use: No  Alcohol/week: 0.0 standard drinks    Family history is   Family History  Problem Relation Age of Onset  . Heart attack Brother 49  . Stroke Brother   . Diabetes Brother   . Stroke Sister       Review of Systems  DATA OBTAINED: from patient, nurse GENERAL:  no fevers, fatigue, appetite changes SKIN: No itching, or rash EYES: No eye pain, redness, discharge EARS: No earache, tinnitus, change in hearing NOSE: No congestion, drainage or bleeding  MOUTH/THROAT: No mouth or tooth pain, No sore throat RESPIRATORY: No cough, wheezing, SOB CARDIAC: No chest pain, palpitations, lower extremity edema  GI: No abdominal pain, No N/V/D or constipation, No heartburn or reflux  GU: No dysuria, frequency or urgency, or incontinence  MUSCULOSKELETAL: No unrelieved bone/joint pain NEUROLOGIC: No headache, dizziness or focal weakness PSYCHIATRIC: No c/o anxiety or sadness   Vitals:   12/17/17 1100  BP: 125/69  Pulse: 72  Resp: 20  Temp: 98 F (36.7 C)    SpO2 Readings from Last 1 Encounters:  12/16/17 100%   Body mass index is 20.08 kg/m.     Physical Exam  GENERAL APPEARANCE: Alert, conversant,  No acute distress.  SKIN: No diaphoresis rash HEAD:  Normocephalic, atraumatic  EYES: Conjunctiva/lids clear. Pupils round, reactive. EOMs intact.  EARS: External exam WNL, canals clear. Hearing grossly normal.  NOSE: No deformity or discharge.  MOUTH/THROAT: Lips w/o lesions  RESPIRATORY: Breathing is even, unlabored. Lung sounds are rhonchi CARDIOVASCULAR: Heart regular no murmurs, rubs or gallops. No peripheral edema.   GASTROINTESTINAL: Abdomen is soft, non-tender, not distended w/ normal bowel sounds. GENITOURINARY: Bladder non tender, not distended  MUSCULOSKELETAL: No abnormal joints or musculature NEUROLOGIC:  Cranial nerves 2-12 grossly intact. Moves all extremities  PSYCHIATRIC: Mood and affect appropriate to situation, no behavioral issues  Patient Active Problem List   Diagnosis Date Noted  . Pelvic fracture ([place]) 12/13/2017  . Protein-calorie malnutrition, severe 06/13/2017  . Fall 06/12/2017  . Headache 09/25/2016  . History of stroke 11/15/2014  . History of meningioma 11/15/2014  . GERD (gastroesophageal reflux disease) 11/19/2011  . Dysphagia 11/18/2011  . Palpitations 11/17/2011  . Encounter for long-term (current) use of other medications 10/29/2011  . Hyperthyroidism 10/14/2011  . Vertigo 10/14/2011  . Bradycardia   . Subarachnoid hemorrhage following injury ([place])   . Hyperlipidemia 03/05/2011  . Takotsubo syndrome 04/22/2008  . Hemiplegia, late effect of cerebrovascular disease ([place]) 07/22/2007  . THYROID NODULE 01/14/2007  . Pulmonary nodule 01/14/2007  . MENINGIOMA 08/27/2006  . Essential hypertension 07/15/2006  . Osteoarthritis 07/15/2006      Labs reviewed: Basic Metabolic Panel:    Component Value Date/Time   NA 133 (L) 12/15/2017 0220   K 4.3 12/15/2017 0220   CL 103 12/15/2017 0220   CO2 24 12/15/2017 0220   GLUCOSE 115 (H) 12/15/2017 0220   BUN 27 (H) 12/15/2017 0220   CREATININE 0.78 12/15/2017 0220   CALCIUM 8.6 (L) 12/15/2017 0220   PROT 6.2 12/09/2017 1406   ALBUMIN 4.0 12/09/2017  1406   AST 14 12/09/2017 1406   ALT 13 12/09/2017 1406   ALKPHOS 64 12/09/2017 1406   BILITOT 0.4 12/09/2017 1406   GFRNONAA >60 12/15/2017 0220   GFRAA >60 12/15/2017 0220    Recent Labs    06/13/17 0224 06/14/17 0357 12/09/17 1406 12/13/17 0801 12/14/17 0340 12/15/17 0220  NA  --  139 138 137 136 133*  K  --  3.6 4.1 4.0 4.4  4.3  CL  --  104 104 108 105 103  CO2  --  27 24 21* 26 24  GLUCOSE  --  104* 162* 131* 110* 115*  BUN  --  10 33* 38* 27* 27*  CREATININE  --  0.84 0.86 0.84 0.87 0.78  CALCIUM  --  8.5* 9.0 8.5* 8.6* 8.6*  MG 1.5* 1.8 1.9  --   --   --    Liver Function Tests: Recent Labs    06/12/17 1954 06/14/17 0357 12/09/17 1406  AST 26 18 14   ALT 15 12* 13  ALKPHOS 89 74 64  BILITOT 0.8 0.7 0.4  PROT 6.3* 5.9* 6.2  ALBUMIN 3.7 3.1* 4.0   Recent Labs    06/12/17 1954  LIPASE 35   No results for input(s): AMMONIA in the last 8760 hours. CBC: Recent Labs    08/11/17 1528 12/09/17 1406 12/13/17 0801 12/14/17 0340 12/15/17 0220  WBC 7.5 17.4* 17.3* 15.4* 16.3*  NEUTROABS 5.0 15.8* 13.6*  --   --   HGB 11.7* 12.5 11.2* 11.6* 12.1  HCT 34.9* 38.1 35.9* 37.8 36.8  MCV 89.0 93.2 97.0 96.2 94.4  PLT 278.0 230.0 184 201 193   Lipid No results for input(s): CHOL, HDL, LDLCALC, TRIG in the last 8760 hours.  Cardiac Enzymes: Recent Labs    06/12/17 1954 06/13/17 0224 06/13/17 0733  TROPONINI <0.03 <0.03 <0.03   BNP: No results for input(s): BNP in the last 8760 hours. No results found for: [facility] Lab Results  Component Value Date   HGBA1C 5.9 (H) 03/05/2011   Lab Results  Component Value Date   TSH 1.50 10/28/2017   Lab Results  Component Value Date   VITAMINB12 1071 (H) 12/09/2007   Lab Results  Component Value Date   FOLATE  12/09/2007    >20.0 (NOTE)  Reference Ranges        Deficient:       0.4 - 3.3 ng/mL        Indeterminate:   3.4 - 5.4 ng/mL        Normal:              > 5.4 ng/mL   Lab Results  Component Value  Date   IRON 80 08/16/2014   TIBC 421 12/09/2007   FERRITIN 8 (L) 12/09/2007    Imaging and Procedures obtained prior to SNF admission: Ct Pelvis Wo Contrast  Result Date: 12/13/2017 CLINICAL DATA:  Fall at 1 a.m.  Right leg and hip pain. EXAM: CT PELVIS WITHOUT CONTRAST TECHNIQUE: Multidetector CT imaging of the pelvis was performed following the standard protocol without intravenous contrast. COMPARISON:  Pelvic CT from 09/13/2011 and pelvic radiographs 12/13/2017 FINDINGS: Urinary Tract:  Unremarkable Bowel:  Sigmoid colon diverticulosis. Vascular/Lymphatic: Aortoiliac atherosclerotic vascular disease. Reproductive:  Uterus absent.  Adnexa unremarkable. Other:  No supplemental non-categorized findings. Musculoskeletal: Displaced fracture of the right inferior pubic ramus, 9 mm displacement observed. There is fracture of the right acetabulum involving the quadrilateral plate on image 69/6, and also extending into the anterior wall in the vicinity of a lucency adjacent to the hemiarthroplasty which may be from poly ethylene where. Chronic avulsion of the distal right piriformis muscle with calcification along its distal margin, and asymmetric atrophy of the muscle belly. No diastasis of the pubis or sacroiliac joints. I do not observe a well-defined sacral fracture, if a sacral fractures present it is occult by CT. Bony demineralization is present. Right hip hemiarthroplasty common no proximal  femoral fracture but the distal stem of the arthroplasty is not fully included. Moderate to severe degenerative chondral thinning in the left hip joint with marginal spurring. No appreciable fracture at L4 or L5. Chronic atrophy of the gluteus medius and minimus muscles, left greater than right. IMPRESSION: 1. Fracture of the anterior wall of the right acetabulum through a lucency which may be from polyethylene particulate disease. This fracture also involves the quadrilateral plate of the right acetabulum, and there  is a transverse mildly displaced fracture of the right inferior pubic ramus. 2. Chronic avulsion of the distal right piriformis muscle, with calcification along the distal tendon margin and asymmetric atrophy of the muscle belly. 3. Diffuse bony demineralization. 4. Moderate to severe degenerative arthropathy of the left hip. 5.  Aortic Atherosclerosis (ICD10-I70.0). 6. Sigmoid colon diverticulosis. Electronically Signed   By: Van Clines M.D.   On: 12/13/2017 10:29   Dg Hip Unilat With Pelvis 2-3 Views Right  Result Date: 12/13/2017 CLINICAL DATA:  Fall this morning with right hip pain. EXAM: DG HIP (WITH OR WITHOUT PELVIS) 2-3V RIGHT COMPARISON:  05/07/2016 FINDINGS: There is moderate diffuse osteopenia. Right total hip arthroplasty is intact and normally located. There is a right inferior pubic ramus fracture which appears acute with minimal displacement. Mild degenerate change of the left hip and spine. IMPRESSION: Right inferior pubic ramus fracture with minimal displacement. Right total hip arthroplasty intact and normally located. Electronically Signed   By: Marin Olp M.D.   On: 12/13/2017 09:12     Not all labs, radiology exams or other studies done during hospitalization come through on my EPIC note; however they are reviewed by me.    Assessment and Plan  Pelvic fracture and acetabular fracture/fall- pain control with scheduled Tylenol and as needed Ultram (Norco appears to make her very sedated); continue Robaxin at a lower dose that she was becoming too sedated SNF- med for OT/PT  Hyper thyroidism SNF- continue methimazole 5 mg daily Monday Wednesday Friday  Vitamin D deficiency SNF- continue 50,000 units p.o. q. 7 days  Hyperlipidemia SNF-not stated as uncontrolled; continue Pravachol 20 mg nightly  Takotsubo syndrome nonobstructive CAD on LHC in 2008- apparently patient had an MI around the time of her meningioma and was felt to be this syndrome SNF- Plavix 75 mg  daily, and nitroglycerin sublingual as needed   Time spent greater than 45 minutes;> 50% of time with patient was spent reviewing records, labs, tests and studies, counseling and developing plan of care  Webb Silversmith D. [name], MD

## 2017-12-19 ENCOUNTER — Encounter: Payer: Self-pay | Admitting: Internal Medicine

## 2017-12-19 DIAGNOSIS — S32409A Unspecified fracture of unspecified acetabulum, initial encounter for closed fracture: Secondary | ICD-10-CM | POA: Insufficient documentation

## 2017-12-22 ENCOUNTER — Ambulatory Visit: Payer: Self-pay | Admitting: *Deleted

## 2017-12-22 LAB — BASIC METABOLIC PANEL
BUN: 22 — AB (ref 4–21)
CREATININE: 0.7 (ref 0.5–1.1)
Glucose: 99
Potassium: 4.1 (ref 3.4–5.3)
Sodium: 138 (ref 137–147)

## 2017-12-22 LAB — CBC AND DIFFERENTIAL
HCT: 34 — AB (ref 36–46)
Hemoglobin: 11.3 — AB (ref 12.0–16.0)
PLATELETS: 296 (ref 150–399)
WBC: 9.4

## 2017-12-22 NOTE — Telephone Encounter (Signed)
Pt last seen by [facility]. Has not established care with new PCP.

## 2017-12-22 NOTE — Telephone Encounter (Signed)
I really think she needs to get re-established with new primary and address with them at that time.

## 2017-12-22 NOTE — Telephone Encounter (Signed)
Please see message.  Please advise. 

## 2017-12-22 NOTE — Telephone Encounter (Signed)
Message from Bea Graff, NT sent at 12/22/2017 10:46 AM EST   Summary: med question   Pts daughter calling and states her mom was recently in the hospital and is now in a rehab facility. She states the hospital stopped her moms isosorbide mononitrate (IMDUR) 30 MG 24 hr tablet and now her blood pressure keeps rising. She is wanting to know why the hospital stopped this medication.         Patient's daughter is concerned that with the change in the BP medication and the patient's pain level- her BP is going to go up. She wants to have her put back on her Imdur 30 mg 1/2 tablet daily. She wants to know why the hospital changed her medication. I told her I could not answer that question for her- but at this point if she is that concerned- she needs to speak with Dr [name] at the rehab facility. She is taking care of her now and she is the person who would be in charge in medication at this point.   Reason for Disposition . Caller has NON-URGENT medication question about med that PCP prescribed and triager unable to answer question    Daughter is concerned about changed in mother's medication- she feels it should have been left the same.  Protocols used: MEDICATION QUESTION CALL-A-AH

## 2017-12-23 NOTE — Telephone Encounter (Signed)
Please see message from Dr. Elease Hashimoto. Dr. Jerilee Hoh maybe?

## 2017-12-23 NOTE — Telephone Encounter (Signed)
Spoke to patient's daughter, she stated she would call and reschedule Dr. Ledell Noss [facility] appointment once she knows when patient will be out of rehab facility.

## 2017-12-24 ENCOUNTER — Encounter: Payer: Self-pay | Admitting: Internal Medicine

## 2017-12-24 ENCOUNTER — Non-Acute Institutional Stay (SKILLED_NURSING_FACILITY): Payer: Medicare Other | Admitting: Internal Medicine

## 2017-12-24 DIAGNOSIS — S32434D Nondisplaced fracture of anterior column [iliopubic] of right acetabulum, subsequent encounter for fracture with routine healing: Secondary | ICD-10-CM | POA: Diagnosis not present

## 2017-12-24 DIAGNOSIS — Z7189 Other specified counseling: Secondary | ICD-10-CM | POA: Diagnosis not present

## 2017-12-24 NOTE — Progress Notes (Signed)
:   Location:  [facility] Room Number: [phone] Place of Service:  SNF (31)  [name] D. [name], MD  Patient Care Team: Marletta Lor, MD as PCP - General Dory Horn, MD as Rounding Team (Internal Medicine) Jacolyn Reedy, MD as Consulting Physician (Cardiology)  Extended Emergency Contact Information Primary Emergency Contact: Warnell Bureau Address: [address]           [place], [place] [address] [name] of [place] Phone: [phone] Work Phone: [phone] Mobile Phone: [phone] Relation: Daughter     Allergies: Atorvastatin; Fluorometholone; Influenza vaccines; Phenytoin; Promethazine hcl; and Sulfamethoxazole  Chief Complaint  Patient presents with  . Acute Visit    Family meeting    HPI: Patient is 84 y.o. female who is being seen today for a family meeting with patient's daughter and son-in-law.  Patient has no physical complaints but her daughter has questions about her medications.  Past Medical History:  Diagnosis Date  . Abnormal CT of the chest    Stable patchy/nodular and ground-glass opacities in the right upper lobe, While grossly unchanged, low grade adenocarcinoma remains possible.  Marland Kitchen ANEMIA, PERNICIOUS 09/10/2006  . Bradycardia    August, 2013  . CORONARY ARTERY DISEASE 11/07/2008   a. NSTEMI 2001 & STEMI 2008 managed medically. b. Takotsubo 06/2006. c. Cath 2009 -> med rx.  . CVA WITH LEFT HEMIPARESIS 07/22/2007   May, 2009, neurology decided aspirin and Plavix at that time.  . DERMATITIS 03/02/2007  . DJD (degenerative joint disease)    L shoulder impingement  . Ejection fraction    EF 60%, echo, July, 2010, (  EF improved after tach a suitable event 2008)  . Family history of anesthesia complication    daughter has a hard time waking up  . H/O hiatal hernia   . HYPERTENSION 07/15/2006  . IBS (irritable bowel syndrome)   . MENINGIOMA 08/27/2006   Occipital meningioma, surgery, [facility],  2008 /  MRI, [place], June, 2011, no change in lesion, history believe the patient had a gamma knife treatment of a right occipital atypical meningioma  . OSTEOARTHRITIS 07/15/2006  . Pelvic fracture ([place])   . PULMONARY NODULE 01/14/2007   [place], December, 2011  . Stroke ([place])   . Subarachnoid hemorrhage following injury ([place]) 2007   Subarachnoid hemorrhage secondary to a fall December, 2007  . Takotsubo syndrome 04/22/2008   MI, May, 2008, question takotsubo event  . THYROID NODULE 01/14/2007    Past Surgical History:  Procedure Laterality Date  . ABDOMINAL HYSTERECTOMY    . BRAIN TUMOR EXCISION     Meningioma; treated at [facility] in 2008  . CHOLECYSTECTOMY    . JOINT REPLACEMENT     rt THR  . SHOULDER SURGERY      Allergies as of 12/24/2017      Reactions   Atorvastatin Other (See Comments)   Raises liver enzymes (takes pravastatin at home)   Fluorometholone    Falling down   Influenza Vaccines Other (See Comments)   "dizziness"   Phenytoin Other (See Comments)   Blood clots   Promethazine Hcl Other (See Comments)   Severe sedation   Sulfamethoxazole    REACTION: unspecified Per pt daughter she stated this causes rash      Medication List        Accurate as of 12/24/17  3:48 PM. Always use your most recent med list.          acetaminophen 325 MG tablet  Commonly known as:  TYLENOL Take 2 tablets (650 mg total) by mouth every 6 (six) hours.   bisacodyl 10 MG suppository Commonly known as:  DULCOLAX Place 1 suppository (10 mg total) rectally daily as needed for moderate constipation.   clopidogrel 75 MG tablet Commonly known as:  PLAVIX TAKE 1 TABLET DAILY WITH BREAKFAST   cyanocobalamin 1000 MCG/ML injection Commonly known as:  (VITAMIN B-12) INJECT 1 ML (1000 MCG TOTAL) INTRAMUSCULARLY EVERY 30 DAYS   docusate sodium 100 MG capsule Commonly known as:  COLACE Take 1 capsule (100 mg total) by mouth 2 (two) times daily.   Melatonin 1 MG Tabs Take 1 mg  by mouth at bedtime as needed (for sleep).   methimazole 5 MG tablet Commonly known as:  TAPAZOLE TAKE 1 TABLET (5 MG TOTALLY) THREE TIMES A WEEK, MON, WED, AND FRI   methocarbamol 500 MG tablet Commonly known as:  ROBAXIN Take 0.5 tablets (250 mg total) by mouth every 6 (six) hours as needed for muscle spasms.   NEEDLE (DISP) 25 G 25G X 5/8" Misc USE ONCE A MONTH FOR B12 INJECTIONS AS DIRECTED   nitroGLYCERIN 0.4 MG SL tablet Commonly known as:  NITROSTAT Place 1 tablet (0.4 mg total) under the tongue every 5 (five) minutes as needed. For chest pain.   pravastatin 20 MG tablet Commonly known as:  PRAVACHOL TAKE 1 TABLET DAILY IN THE EVENING   senna 8.6 MG Tabs tablet Commonly known as:  SENOKOT Take 1 tablet (8.6 mg total) by mouth at bedtime.   Syringe (Disposable) 3 ML Misc USE AS DIRECTED ONCE A MONTH FOR B12 INJECTIONS   traMADol 50 MG tablet Commonly known as:  ULTRAM Take 1 tablet (50 mg total) by mouth every 6 (six) hours as needed for severe pain.   Vitamin D (Ergocalciferol) 1.25 MG (50000 UT) Caps capsule Commonly known as:  DRISDOL Take 1 capsule (50,000 Units total) by mouth every 7 (seven) days.       No orders of the defined types were placed in this encounter.   Immunization History  Administered Date(s) Administered  . Pneumococcal Conjugate-13 04/18/2014  . Pneumococcal Polysaccharide-23 10/19/2012  . Tetanus 10/19/2012    Social History   Tobacco Use  . Smoking status: Never Smoker  . Smokeless tobacco: Never Used  Substance Use Topics  . Alcohol use: No    Alcohol/week: 0.0 standard drinks    Family history is   Family History  Problem Relation Age of Onset  . Heart attack Brother 46  . Stroke Brother   . Diabetes Brother   . Stroke Sister       Review of Systems  DATA OBTAINED: from patient nurse GENERAL:  no fevers, fatigue, appetite changes SKIN: No itching, or rash EYES: No eye pain, redness, discharge EARS: No  earache, tinnitus, change in hearing NOSE: No congestion, drainage or bleeding  MOUTH/THROAT: No mouth or tooth pain, No sore throat RESPIRATORY: No cough, wheezing, SOB CARDIAC: No chest pain, palpitations, lower extremity edema  GI: No abdominal pain, No N/V/D or constipation, No heartburn or reflux  GU: No dysuria, frequency or urgency, or incontinence  MUSCULOSKELETAL: No unrelieved bone/joint pain NEUROLOGIC: No headache, dizziness or focal weakness PSYCHIATRIC: No c/o anxiety or sadness   Vitals:   12/24/17 1535  BP: (!) 116/59  Pulse: 68  Resp: 18  Temp: (!) 97.1 F (36.2 C)    SpO2 Readings from Last 1 Encounters:  12/16/17 100%   Body mass index  is 18.71 kg/m.     Physical Exam  GENERAL APPEARANCE: Alert, minimally conversant,  No acute distress.  SKIN: No diaphoresis rash HEAD: Normocephalic, atraumatic  EYES: Conjunctiva/lids clear. Pupils round, reactive. EOMs intact.  EARS: External exam WNL, canals clear. Hearing grossly normal.  NOSE: No deformity or discharge.  MOUTH/THROAT: Lips w/o lesions  RESPIRATORY: Breathing is even, unlabored. Lung sounds are clear   CARDIOVASCULAR: Heart RRR no murmurs, rubs or gallops. No peripheral edema.   GASTROINTESTINAL: Abdomen is soft, non-tender, not distended w/ normal bowel sounds. GENITOURINARY: Bladder non tender, not distended  MUSCULOSKELETAL: No abnormal joints or musculature NEUROLOGIC:  Cranial nerves 2-12 grossly intact. Moves all extremities  PSYCHIATRIC: Mood and affect appropriate to situation, no behavioral issues  Patient Active Problem List   Diagnosis Date Noted  . Acetabular fracture ([place]) 12/19/2017  . Pelvic fracture ([place]) 12/13/2017  . Protein-calorie malnutrition, severe 06/13/2017  . Fall 06/12/2017  . Headache 09/25/2016  . History of stroke 11/15/2014  . History of meningioma 11/15/2014  . GERD (gastroesophageal reflux disease) 11/19/2011  . Dysphagia 11/18/2011  . Palpitations  11/17/2011  . Encounter for long-term (current) use of other medications 10/29/2011  . Hyperthyroidism 10/14/2011  . Vertigo 10/14/2011  . Bradycardia   . Subarachnoid hemorrhage following injury ([place])   . Hyperlipidemia 03/05/2011  . Takotsubo syndrome 04/22/2008  . Hemiplegia, late effect of cerebrovascular disease ([place]) 07/22/2007  . THYROID NODULE 01/14/2007  . Pulmonary nodule 01/14/2007  . MENINGIOMA 08/27/2006  . Essential hypertension 07/15/2006  . Osteoarthritis 07/15/2006      Labs reviewed: Basic Metabolic Panel:    Component Value Date/Time   NA 138 12/22/2017   K 4.1 12/22/2017   CL 103 12/15/2017 0220   CO2 24 12/15/2017 0220   GLUCOSE 115 (H) 12/15/2017 0220   BUN 22 (A) 12/22/2017   CREATININE 0.7 12/22/2017   CREATININE 0.78 12/15/2017 0220   CALCIUM 8.6 (L) 12/15/2017 0220   PROT 6.2 12/09/2017 1406   ALBUMIN 4.0 12/09/2017 1406   AST 14 12/09/2017 1406   ALT 13 12/09/2017 1406   ALKPHOS 64 12/09/2017 1406   BILITOT 0.4 12/09/2017 1406   GFRNONAA >60 12/15/2017 0220   GFRAA >60 12/15/2017 0220    Recent Labs    06/13/17 0224 06/14/17 0357 12/09/17 1406 12/13/17 0801 12/14/17 0340 12/15/17 0220 12/22/17  NA  --  139 138 137 136 133* 138  K  --  3.6 4.1 4.0 4.4 4.3 4.1  CL  --  104 104 108 105 103  --   CO2  --  27 24 21* 26 24  --   GLUCOSE  --  104* 162* 131* 110* 115*  --   BUN  --  10 33* 38* 27* 27* 22*  CREATININE  --  0.84 0.86 0.84 0.87 0.78 0.7  CALCIUM  --  8.5* 9.0 8.5* 8.6* 8.6*  --   MG 1.5* 1.8 1.9  --   --   --   --    Liver Function Tests: Recent Labs    06/12/17 1954 06/14/17 0357 12/09/17 1406  AST 26 18 14   ALT 15 12* 13  ALKPHOS 89 74 64  BILITOT 0.8 0.7 0.4  PROT 6.3* 5.9* 6.2  ALBUMIN 3.7 3.1* 4.0   Recent Labs    06/12/17 1954  LIPASE 35   No results for input(s): AMMONIA in the last 8760 hours. CBC: Recent Labs    08/11/17 1528 12/09/17 1406 12/13/17 0801 12/14/17 0340 12/15/17  0220 12/22/17    WBC 7.5 17.4* 17.3* 15.4* 16.3* 9.4  NEUTROABS 5.0 15.8* 13.6*  --   --   --   HGB 11.7* 12.5 11.2* 11.6* 12.1 11.3*  HCT 34.9* 38.1 35.9* 37.8 36.8 34*  MCV 89.0 93.2 97.0 96.2 94.4  --   PLT 278.0 230.0 184 201 193 296   Lipid No results for input(s): CHOL, HDL, LDLCALC, TRIG in the last 8760 hours.  Cardiac Enzymes: Recent Labs    06/12/17 1954 06/13/17 0224 06/13/17 0733  TROPONINI <0.03 <0.03 <0.03   BNP: No results for input(s): BNP in the last 8760 hours. No results found for: [facility] Lab Results  Component Value Date   HGBA1C 5.9 (H) 03/05/2011   Lab Results  Component Value Date   TSH 1.50 10/28/2017   Lab Results  Component Value Date   VITAMINB12 1071 (H) 12/09/2007   Lab Results  Component Value Date   FOLATE  12/09/2007    >20.0 (NOTE)  Reference Ranges        Deficient:       0.4 - 3.3 ng/mL        Indeterminate:   3.4 - 5.4 ng/mL        Normal:              > 5.4 ng/mL   Lab Results  Component Value Date   IRON 80 08/16/2014   TIBC 421 12/09/2007   FERRITIN 8 (L) 12/09/2007    Imaging and Procedures obtained prior to SNF admission: Ct Pelvis Wo Contrast  Result Date: 12/13/2017 CLINICAL DATA:  Fall at 1 a.m.  Right leg and hip pain. EXAM: CT PELVIS WITHOUT CONTRAST TECHNIQUE: Multidetector CT imaging of the pelvis was performed following the standard protocol without intravenous contrast. COMPARISON:  Pelvic CT from 09/13/2011 and pelvic radiographs 12/13/2017 FINDINGS: Urinary Tract:  Unremarkable Bowel:  Sigmoid colon diverticulosis. Vascular/Lymphatic: Aortoiliac atherosclerotic vascular disease. Reproductive:  Uterus absent.  Adnexa unremarkable. Other:  No supplemental non-categorized findings. Musculoskeletal: Displaced fracture of the right inferior pubic ramus, 9 mm displacement observed. There is fracture of the right acetabulum involving the quadrilateral plate on image 64/4, and also extending into the anterior wall in the vicinity of a  lucency adjacent to the hemiarthroplasty which may be from poly ethylene where. Chronic avulsion of the distal right piriformis muscle with calcification along its distal margin, and asymmetric atrophy of the muscle belly. No diastasis of the pubis or sacroiliac joints. I do not observe a well-defined sacral fracture, if a sacral fractures present it is occult by CT. Bony demineralization is present. Right hip hemiarthroplasty common no proximal femoral fracture but the distal stem of the arthroplasty is not fully included. Moderate to severe degenerative chondral thinning in the left hip joint with marginal spurring. No appreciable fracture at L4 or L5. Chronic atrophy of the gluteus medius and minimus muscles, left greater than right. IMPRESSION: 1. Fracture of the anterior wall of the right acetabulum through a lucency which may be from polyethylene particulate disease. This fracture also involves the quadrilateral plate of the right acetabulum, and there is a transverse mildly displaced fracture of the right inferior pubic ramus. 2. Chronic avulsion of the distal right piriformis muscle, with calcification along the distal tendon margin and asymmetric atrophy of the muscle belly. 3. Diffuse bony demineralization. 4. Moderate to severe degenerative arthropathy of the left hip. 5.  Aortic Atherosclerosis (ICD10-I70.0). 6. Sigmoid colon diverticulosis. Electronically Signed   By: Van Clines  M.D.   On: 12/13/2017 10:29   Dg Hip Unilat With Pelvis 2-3 Views Right  Result Date: 12/13/2017 CLINICAL DATA:  Fall this morning with right hip pain. EXAM: DG HIP (WITH OR WITHOUT PELVIS) 2-3V RIGHT COMPARISON:  05/07/2016 FINDINGS: There is moderate diffuse osteopenia. Right total hip arthroplasty is intact and normally located. There is a right inferior pubic ramus fracture which appears acute with minimal displacement. Mild degenerate change of the left hip and spine. IMPRESSION: Right inferior pubic ramus  fracture with minimal displacement. Right total hip arthroplasty intact and normally located. Electronically Signed   By: Marin Olp M.D.   On: 12/13/2017 09:12     Not all labs, radiology exams or other studies done during hospitalization come through on my EPIC note; however they are reviewed by me.    Assessment and Plan  Acetabular fracture/encounter for family conference with patient present- patient's daughter has multiple questions about medications which I was able to answer, and some about insurance and nursing home procedures that I was not able to answer but discussed with the family to their satisfaction.   Spent greater than 35 minutes Kahlan Engebretson D. [name], MD

## 2017-12-25 ENCOUNTER — Encounter: Payer: Medicare Other | Admitting: Internal Medicine

## 2017-12-28 ENCOUNTER — Encounter: Payer: Self-pay | Admitting: Internal Medicine

## 2017-12-29 ENCOUNTER — Encounter (INDEPENDENT_AMBULATORY_CARE_PROVIDER_SITE_OTHER): Payer: Self-pay | Admitting: Specialist

## 2017-12-29 ENCOUNTER — Ambulatory Visit (INDEPENDENT_AMBULATORY_CARE_PROVIDER_SITE_OTHER): Payer: Medicare Other | Admitting: Specialist

## 2017-12-29 ENCOUNTER — Ambulatory Visit (INDEPENDENT_AMBULATORY_CARE_PROVIDER_SITE_OTHER): Payer: Self-pay

## 2017-12-29 ENCOUNTER — Telehealth (INDEPENDENT_AMBULATORY_CARE_PROVIDER_SITE_OTHER): Payer: Self-pay | Admitting: Specialist

## 2017-12-29 VITALS — BP 123/54 | HR 51 | Ht 64.0 in | Wt 117.0 lb

## 2017-12-29 DIAGNOSIS — M25551 Pain in right hip: Secondary | ICD-10-CM | POA: Diagnosis not present

## 2017-12-29 DIAGNOSIS — S32591D Other specified fracture of right pubis, subsequent encounter for fracture with routine healing: Secondary | ICD-10-CM | POA: Diagnosis not present

## 2017-12-29 DIAGNOSIS — S32511D Fracture of superior rim of right pubis, subsequent encounter for fracture with routine healing: Secondary | ICD-10-CM

## 2017-12-29 NOTE — Patient Instructions (Addendum)
    Keep dressing dry. May use tub chair to shower  Call if there is worsening pain not controlled with medications. Call if fever greater than 101.5. Use crutches or walker allow to weight bearing weight bearing as tolerated on the right leg. Please follow up with an appointment with Dr. Louanne Skye  3 weeks from today. Elevate as often as needed the right the leg to decrease swelling. Wheel chair for longer distances.Apply ice to the right pelvis site as needed to relieve pain. Take aspirin 325 mg every day with food or snack Continue with PT and OT to improve her independence to where she can be able to return home with her family.

## 2017-12-29 NOTE — Progress Notes (Addendum)
Office Visit Note   Patient: [name]           Date of Birth: 03-Jun-1922           MRN: [id number] Visit Date: 12/29/2017              Requested by: Marletta Lor, MD [place], [place] [address] PCP: Marletta Lor, MD   Assessment & Plan: Visit Diagnoses:  1. Pain in right hip   2. Inferior pubic ramus fracture, right, with routine healing, subsequent encounter   3. Closed fracture of right superior pubic ramus, with routine healing, subsequent encounter     Plan:   Keep dressing dry. May use tub chair to shower  Call if there is odor or saturation of dressing or worsening pain not controlled with medications. Call if fever greater than 101.5. Use crutches or walker allow to weight bearing weight bearing as tolerated on the right leg. Please follow up with an appointment with Dr. Louanne Skye  3 weeks from today. Elevate as often as needed the right the leg to decrease swelling. Wheel chair for longer distances.Apply ice to the right pelvis site as needed to relieve pain. Take aspirin 325 mg every day with food or snack  Keep dressing dry. May use tub chair to shower  Call if there is odor or saturation of dressing or worsening pain not controlled with medications. Call if fever greater than 101.5. Use crutches or walker allow to weight bearing weight bearing as tolerated on the right leg. Please follow up with an appointment with Dr. Louanne Skye  3 weeks from today. Elevate as often as needed the right the leg to decrease swelling. Wheel chair for longer distances.Apply ice to the right pelvis site as needed to relieve pain. Take aspirin 325 mg every day with food or snack Continue with Vitamin D supplements. Continue with PT and OT to improve her independence to where she can be able to return home with her family.  Follow-Up Instructions: Return in about 3 weeks (around 01/19/2018).   Orders:  Orders Placed This Encounter  Procedures  . XR  Pelvis 1-2 Views   No orders of the defined types were placed in this encounter.     Procedures: No procedures performed   Clinical Data: No additional findings.   Subjective: Chief Complaint  Patient presents with  . Pelvis - Fracture, Follow-up    84 year old female with past history of right THR by Dr. Lorin Mercy in 1998. The right THR has done well. She fell one year ago with contusion of the right hip, most recently fell 2 weeks ago with right superior and inferior pubic rami fracture, vitamin D deficient.   Review of Systems  Constitutional: Negative.   HENT: Negative.   Eyes: Negative.   Respiratory: Negative.   Cardiovascular: Negative.   Gastrointestinal: Negative.   Endocrine: Negative.   Genitourinary: Negative.   Musculoskeletal: Negative.   Skin: Negative.   Allergic/Immunologic: Negative.   Neurological: Negative.   Hematological: Negative.   Psychiatric/Behavioral: Negative.      Objective: Vital Signs: BP (!) 123/54 (BP Location: Left Arm, Patient Position: Sitting)   Pulse (!) 51   Ht 5\' 4"  (1.626 m)   Wt 117 lb (53.1 kg)   BMI 20.08 kg/m   Physical Exam  Constitutional: She is oriented to person, place, and time. She appears well-developed and well-nourished.  HENT:  Head: Normocephalic and atraumatic.  Eyes: Pupils are equal, round,  and reactive to light. EOM are normal.  Neck: Normal range of motion. Neck supple.  Pulmonary/Chest: Effort normal and breath sounds normal.  Abdominal: Soft. Bowel sounds are normal.  Neurological: She is alert and oriented to person, place, and time.  Skin: Skin is warm and dry.  Psychiatric: She has a normal mood and affect. Her behavior is normal. Judgment and thought content normal.    Right Hip Exam   Tenderness  The patient is experiencing tenderness in the anterior and greater trochanter.  Range of Motion  Abduction: abnormal  Adduction: abnormal  Extension: abnormal  Flexion: abnormal  External  rotation: abnormal  Internal rotation: abnormal   Muscle Strength  Abduction: 5/5  Adduction: 5/5  Flexion: 5/5   Tests  FABER: negative [place]: positive  Other  Erythema: absent Scars: absent Sensation: normal Pulse: present      Specialty Comments:  No specialty comments available.  Imaging: No results found.   PMFS History: Patient Active Problem List   Diagnosis Date Noted  . Acetabular fracture ([place]) 12/19/2017  . Pelvic fracture ([place]) 12/13/2017  . Protein-calorie malnutrition, severe 06/13/2017  . Fall 06/12/2017  . Headache 09/25/2016  . History of stroke 11/15/2014  . History of meningioma 11/15/2014  . GERD (gastroesophageal reflux disease) 11/19/2011  . Dysphagia 11/18/2011  . Palpitations 11/17/2011  . Encounter for long-term (current) use of other medications 10/29/2011  . Hyperthyroidism 10/14/2011  . Vertigo 10/14/2011  . Bradycardia   . Subarachnoid hemorrhage following injury ([place])   . Hyperlipidemia 03/05/2011  . Takotsubo syndrome 04/22/2008  . Hemiplegia, late effect of cerebrovascular disease ([place]) 07/22/2007  . THYROID NODULE 01/14/2007  . Pulmonary nodule 01/14/2007  . MENINGIOMA 08/27/2006  . Essential hypertension 07/15/2006  . Osteoarthritis 07/15/2006   Past Medical History:  Diagnosis Date  . Abnormal CT of the chest    Stable patchy/nodular and ground-glass opacities in the right upper lobe, While grossly unchanged, low grade adenocarcinoma remains possible.  Marland Kitchen ANEMIA, PERNICIOUS 09/10/2006  . Bradycardia    August, 2013  . CORONARY ARTERY DISEASE 11/07/2008   a. NSTEMI 2001 & STEMI 2008 managed medically. b. Takotsubo 06/2006. c. Cath 2009 -> med rx.  . CVA WITH LEFT HEMIPARESIS 07/22/2007   May, 2009, neurology decided aspirin and Plavix at that time.  . DERMATITIS 03/02/2007  . DJD (degenerative joint disease)    L shoulder impingement  . Ejection fraction    EF 60%, echo, July, 2010, (  EF improved after tach a suitable  event 2008)  . Family history of anesthesia complication    daughter has a hard time waking up  . H/O hiatal hernia   . HYPERTENSION 07/15/2006  . IBS (irritable bowel syndrome)   . MENINGIOMA 08/27/2006   Occipital meningioma, surgery, [facility], 2008 /  MRI, [place], June, 2011, no change in lesion, history believe the patient had a gamma knife treatment of a right occipital atypical meningioma  . OSTEOARTHRITIS 07/15/2006  . Pelvic fracture ([place])   . PULMONARY NODULE 01/14/2007   [place], December, 2011  . Stroke ([place])   . Subarachnoid hemorrhage following injury ([place]) 2007   Subarachnoid hemorrhage secondary to a fall December, 2007  . Takotsubo syndrome 04/22/2008   MI, May, 2008, question takotsubo event  . THYROID NODULE 01/14/2007    Family History  Problem Relation Age of Onset  . Heart attack Brother 84  . Stroke Brother   . Diabetes Brother   . Stroke Sister     Past  Surgical History:  Procedure Laterality Date  . ABDOMINAL HYSTERECTOMY    . BRAIN TUMOR EXCISION     Meningioma; treated at [facility] in 2008  . CHOLECYSTECTOMY    . JOINT REPLACEMENT     rt THR  . SHOULDER SURGERY     Social History   Occupational History    Employer: RETIRED  Tobacco Use  . Smoking status: Never Smoker  . Smokeless tobacco: Never Used  Substance and Sexual Activity  . Alcohol use: No    Alcohol/week: 0.0 standard drinks  . Drug use: No  . Sexual activity: Never    Birth control/protection: Surgical

## 2017-12-29 NOTE — Telephone Encounter (Signed)
02/05/17 @10 :15am  3 week fu needed 01/22/18 not a good day

## 2017-12-30 NOTE — Telephone Encounter (Signed)
Added to cancellation list 

## 2017-12-31 ENCOUNTER — Telehealth (INDEPENDENT_AMBULATORY_CARE_PROVIDER_SITE_OTHER): Payer: Self-pay | Admitting: Specialist

## 2017-12-31 NOTE — Telephone Encounter (Signed)
Patient's daughter called stating that they are needing Dr. Louanne Skye to put in the patient's notes that the patient is needing PT until the next visit with Dr. Louanne Skye.  The facility that she is currently is in wants to release her when she can not stand up.  They also stated that they are not able to take care of her in her current condition.  CB#[phone].  Thank you.

## 2017-12-31 NOTE — Telephone Encounter (Signed)
Patient's daughter called stating that they are needing Dr. Louanne Skye to put in the patient's notes that the patient is needing PT until the next visit with Dr. Louanne Skye.  The facility that she is currently is in wants to release her when she can not stand up.  They also stated that they are not able to take care of her in her current condition.  CB#[phone].  Thank you.

## 2018-01-03 ENCOUNTER — Other Ambulatory Visit: Payer: Self-pay | Admitting: Internal Medicine

## 2018-01-05 ENCOUNTER — Encounter: Payer: Self-pay | Admitting: Internal Medicine

## 2018-01-05 NOTE — Progress Notes (Signed)
Location:  [facility] Room Number: [phone] Place of Service:  [phone])  Noah Delaine. [name], MD  Patient Care Team: Marletta Lor, MD as PCP - General Dory Horn, MD as Rounding Team (Internal Medicine) Jacolyn Reedy, MD as Consulting Physician (Cardiology)  Extended Emergency Contact Information Primary Emergency Contact: Warnell Bureau Address: [address]           [place],  [address] [name] of [place] Phone: [phone] Work Phone: [phone] Mobile Phone: [phone] Relation: Daughter  Allergies  Allergen Reactions  . Atorvastatin Other (See Comments)    Raises liver enzymes (takes pravastatin at home)  . Fluorometholone     Falling down  . Influenza Vaccines Other (See Comments)    "dizziness"  . Phenytoin Other (See Comments)    Blood clots  . Promethazine Hcl Other (See Comments)    Severe sedation  . Sulfamethoxazole     REACTION: unspecified  Per pt daughter she stated this causes rash    Chief Complaint  Patient presents with  . Discharge Note    Discharge from [facility]    HPI:  84 y.o. female      Past Medical History:  Diagnosis Date  . Abnormal CT of the chest    Stable patchy/nodular and ground-glass opacities in the right upper lobe, While grossly unchanged, low grade adenocarcinoma remains possible.  [place] Kitchen ANEMIA, PERNICIOUS 09/10/2006  . Bradycardia    August, 2013  . CORONARY ARTERY DISEASE 11/07/2008   a. NSTEMI 2001 & STEMI 2008 managed medically. b. Takotsubo 06/2006. c. Cath 2009 -> med rx.  . CVA WITH LEFT HEMIPARESIS 07/22/2007   May, 2009, neurology decided aspirin and Plavix at that time.  . DERMATITIS 03/02/2007  . DJD (degenerative joint disease)    L shoulder impingement  . Ejection fraction    EF 60%, echo, July, 2010, (  EF improved after tach a suitable event 2008)  . Family history of anesthesia complication    daughter has a hard time waking up  . H/O  hiatal hernia   . HYPERTENSION 07/15/2006  . IBS (irritable bowel syndrome)   . MENINGIOMA 08/27/2006   Occipital meningioma, surgery, [facility], 2008 /  MRI, [place], June, 2011, no change in lesion, history believe the patient had a gamma knife treatment of a right occipital atypical meningioma  . OSTEOARTHRITIS 07/15/2006  . Pelvic fracture ([place])   . PULMONARY NODULE 01/14/2007   [place], December, 2011  . Stroke ([place])   . Subarachnoid hemorrhage following injury ([place]) 2007   Subarachnoid hemorrhage secondary to a fall December, 2007  . Takotsubo syndrome 04/22/2008   MI, May, 2008, question takotsubo event  . THYROID NODULE 01/14/2007    Past Surgical History:  Procedure Laterality Date  . ABDOMINAL HYSTERECTOMY    . BRAIN TUMOR EXCISION     Meningioma; [facility] at [facility] in 2008  . CHOLECYSTECTOMY    . JOINT REPLACEMENT     rt THR  . SHOULDER SURGERY       reports that she has never smoked. She has never used smokeless tobacco. She reports that she does not drink alcohol or use drugs. Social History   Socioeconomic History  . Marital status: Widowed    Spouse name: Not on file  . Number of children: 4  . Years of education: Not on file  . Highest education level: Not on file  Occupational History    Employer: RETIRED  Social Needs  .  Financial resource strain: Not on file  . Food insecurity:    Worry: Not on file    Inability: Not on file  . Transportation needs:    Medical: Not on file    Non-medical: Not on file  Tobacco Use  . Smoking status: Never Smoker  . Smokeless tobacco: Never Used  Substance and Sexual Activity  . Alcohol use: No    Alcohol/week: 0.0 standard drinks  . Drug use: No  . Sexual activity: Never    Birth control/protection: Surgical  Lifestyle  . Physical activity:    Days per week: Not on file    Minutes per session: Not on file  . Stress: Not on file  Relationships  . Social connections:    Talks on phone: Not on file    Gets  together: Not on file    Attends religious service: Not on file    Active member of club or organization: Not on file    Attends meetings of clubs or organizations: Not on file    Relationship status: Not on file  . Intimate partner violence:    Fear of current or ex partner: Not on file    Emotionally abused: Not on file    Physically abused: Not on file    Forced sexual activity: Not on file  Other Topics Concern  . Not on file  Social History Narrative   Widowed. Lives in [place], [place] with daughter.     Pertinent  Health Maintenance Due  Topic Date Due  . DEXA SCAN  01/23/2018 (Originally 02/[birth year]/1989)  . PNA vac Low Risk Adult  Completed    Medications: Allergies as of 01/05/2018      Reactions   Atorvastatin Other (See Comments)   Raises liver enzymes (takes pravastatin at home)   Fluorometholone    Falling down   Influenza Vaccines Other (See Comments)   "dizziness"   Phenytoin Other (See Comments)   Blood clots   Promethazine Hcl Other (See Comments)   Severe sedation   Sulfamethoxazole    REACTION: unspecified Per pt daughter she stated this causes rash      Medication List        Accurate as of 01/05/18  2:20 PM. Always use your most recent med list.          acetaminophen 325 MG tablet Commonly known as:  TYLENOL Take 2 tablets (650 mg total) by mouth every 6 (six) hours.   bisacodyl 10 MG suppository Commonly known as:  DULCOLAX Place 1 suppository (10 mg total) rectally daily as needed for moderate constipation.   clopidogrel 75 MG tablet Commonly known as:  PLAVIX TAKE 1 TABLET DAILY WITH BREAKFAST   cyanocobalamin 1000 MCG/ML injection Commonly known as:  (VITAMIN B-12) INJECT 1 ML (1000 MCG TOTAL) INTRAMUSCULARLY EVERY 30 DAYS   cyclobenzaprine 5 MG tablet Commonly known as:  FLEXERIL Take 5 mg by mouth. take 1/2 tab ( 2.5 mg) po daily after PT ( not ST or OT).   docusate sodium 100 MG capsule Commonly known as:  COLACE Take 1 capsule  (100 mg total) by mouth 2 (two) times daily.   Melatonin 1 MG Tabs Take 1 mg by mouth at bedtime as needed (for sleep).   methimazole 5 MG tablet Commonly known as:  TAPAZOLE TAKE 1 TABLET (5 MG TOTALLY) THREE TIMES A WEEK, MON, WED, AND FRI   NEEDLE (DISP) 25 G 25G X 5/8" Misc USE ONCE A MONTH FOR B12 INJECTIONS AS  DIRECTED   nitroGLYCERIN 0.4 MG SL tablet Commonly known as:  NITROSTAT Place 1 tablet (0.4 mg total) under the tongue every 5 (five) minutes as needed. For chest pain.   pravastatin 20 MG tablet Commonly known as:  PRAVACHOL TAKE 1 TABLET DAILY IN THE EVENING   senna 8.6 MG Tabs tablet Commonly known as:  SENOKOT Take 1 tablet (8.6 mg total) by mouth at bedtime.   Syringe (Disposable) 3 ML Misc USE AS DIRECTED ONCE A MONTH FOR B12 INJECTIONS   Vitamin D (Ergocalciferol) 1.25 MG (50000 UT) Caps capsule Commonly known as:  DRISDOL Take 1 capsule (50,000 Units total) by mouth every 7 (seven) days.        Vitals:   01/05/18 1206  BP: 118/70  Pulse: 67  Resp: 18  Temp: (!) 97 F (36.1 C)  Weight: 117 lb (53.1 kg)  Height: 5\' 4"  (1.626 m)   Body mass index is 20.08 kg/m.  Physical Exam  GENERAL APPEARANCE: Alert, conversant. No acute distress.  HEENT: Unremarkable. RESPIRATORY: Breathing is even, unlabored. Lung sounds are clear   CARDIOVASCULAR: Heart RRR no murmurs, rubs or gallops. No peripheral edema.  GASTROINTESTINAL: Abdomen is soft, non-tender, not distended w/ normal bowel sounds.  NEUROLOGIC: Cranial nerves 2-12 grossly intact. Moves all extremities   Labs reviewed: Basic Metabolic Panel: Recent Labs    06/13/17 0224 06/14/17 0357 12/09/17 1406 12/13/17 0801 12/14/17 0340 12/15/17 0220 12/22/17  NA  --  139 138 137 136 133* 138  K  --  3.6 4.1 4.0 4.4 4.3 4.1  CL  --  104 104 108 105 103  --   CO2  --  27 24 21* 26 24  --   GLUCOSE  --  104* 162* 131* 110* 115*  --   BUN  --  10 33* 38* 27* 27* 22*  CREATININE  --  0.84 0.86  0.84 0.87 0.78 0.7  CALCIUM  --  8.5* 9.0 8.5* 8.6* 8.6*  --   MG 1.5* 1.8 1.9  --   --   --   --    No results found for: [facility] Liver Function Tests: Recent Labs    06/12/17 1954 06/14/17 0357 12/09/17 1406  AST 26 18 14   ALT 15 12* 13  ALKPHOS 89 74 64  BILITOT 0.8 0.7 0.4  PROT 6.3* 5.9* 6.2  ALBUMIN 3.7 3.1* 4.0   Recent Labs    06/12/17 1954  LIPASE 35   No results for input(s): AMMONIA in the last 8760 hours. CBC: Recent Labs    08/11/17 1528 12/09/17 1406 12/13/17 0801 12/14/17 0340 12/15/17 0220 12/22/17  WBC 7.5 17.4* 17.3* 15.4* 16.3* 9.4  NEUTROABS 5.0 15.8* 13.6*  --   --   --   HGB 11.7* 12.5 11.2* 11.6* 12.1 11.3*  HCT 34.9* 38.1 35.9* 37.8 36.8 34*  MCV 89.0 93.2 97.0 96.2 94.4  --   PLT 278.0 230.0 184 201 193 296   Lipid No results for input(s): CHOL, HDL, LDLCALC, TRIG in the last 8760 hours. Cardiac Enzymes: Recent Labs    06/12/17 1954 06/13/17 0224 06/13/17 0733  TROPONINI <0.03 <0.03 <0.03   BNP: No results for input(s): BNP in the last 8760 hours. CBG: No results for input(s): GLUCAP in the last 8760 hours.  Procedures and Imaging Studies During Stay: Dg Chest 2 View  Result Date: 12/11/2017 CLINICAL DATA:  Leukocytosis, dizziness, headache, history coronary artery disease post MI, hypertension, stroke EXAM: CHEST - 2 VIEW  COMPARISON:  06/12/2017 FINDINGS: Enlargement of cardiac silhouette. Mediastinal contours and pulmonary vascularity normal. Atherosclerotic calcification aorta. Persistent nodular density at RIGHT apex, 13 mm diameter previously 12 mm. Emphysematous and minimal bronchitic changes consistent with COPD. Minimal chronic accentuation of pulmonary markings without acute infiltrate, pleural effusion or pneumothorax. Bones demineralized. IMPRESSION: Changes of COPD with persistent nodular focus at the RIGHT upper lobe now 13 mm diameter previously 12 mm; further evaluation by CT chest recommended. No acute infiltrates.  These results will be called to the ordering clinician or representative by the Radiologist Assistant, and communication documented in the PACS or zVision Dashboard. Electronically Signed   By: Lavonia Dana M.D.   On: 12/11/2017 08:24   Ct Pelvis Wo Contrast  Result Date: 12/13/2017 CLINICAL DATA:  Fall at 1 a.m.  Right leg and hip pain. EXAM: CT PELVIS WITHOUT CONTRAST TECHNIQUE: Multidetector CT imaging of the pelvis was performed following the standard protocol without intravenous contrast. COMPARISON:  Pelvic CT from 09/13/2011 and pelvic radiographs 12/13/2017 FINDINGS: Urinary Tract:  Unremarkable Bowel:  Sigmoid colon diverticulosis. Vascular/Lymphatic: Aortoiliac atherosclerotic vascular disease. Reproductive:  Uterus absent.  Adnexa unremarkable. Other:  No supplemental non-categorized findings. Musculoskeletal: Displaced fracture of the right inferior pubic ramus, 9 mm displacement observed. There is fracture of the right acetabulum involving the quadrilateral plate on image 33/8, and also extending into the anterior wall in the vicinity of a lucency adjacent to the hemiarthroplasty which may be from poly ethylene where. Chronic avulsion of the distal right piriformis muscle with calcification along its distal margin, and asymmetric atrophy of the muscle belly. No diastasis of the pubis or sacroiliac joints. I do not observe a well-defined sacral fracture, if a sacral fractures present it is occult by CT. Bony demineralization is present. Right hip hemiarthroplasty common no proximal femoral fracture but the distal stem of the arthroplasty is not fully included. Moderate to severe degenerative chondral thinning in the left hip joint with marginal spurring. No appreciable fracture at L4 or L5. Chronic atrophy of the gluteus medius and minimus muscles, left greater than right. IMPRESSION: 1. Fracture of the anterior wall of the right acetabulum through a lucency which may be from polyethylene particulate  disease. This fracture also involves the quadrilateral plate of the right acetabulum, and there is a transverse mildly displaced fracture of the right inferior pubic ramus. 2. Chronic avulsion of the distal right piriformis muscle, with calcification along the distal tendon margin and asymmetric atrophy of the muscle belly. 3. Diffuse bony demineralization. 4. Moderate to severe degenerative arthropathy of the left hip. 5.  Aortic Atherosclerosis (ICD10-I70.0). 6. Sigmoid colon diverticulosis. Electronically Signed   By: Van Clines M.D.   On: 12/13/2017 10:29   Dg Chest Port 1 View  Result Date: 12/15/2017 CLINICAL DATA:  Rapid breathing and shortness of breath this morning, history coronary artery disease post NSTEMI, hypertension, stroke EXAM: PORTABLE CHEST 1 VIEW COMPARISON:  Portable exam 0921 hours compared to 12/10/2017 FINDINGS: Enlargement of cardiac silhouette. Atherosclerotic calcification aorta. Mediastinal contours and pulmonary vascularity normal. Nodular density seen previously at the RIGHT apex is less well demonstrated on current exam. Lungs emphysematous with minimal bibasilar atelectasis. No acute infiltrate, pleural effusion or pneumothorax. Bones demineralized. IMPRESSION: COPD changes with bibasilar atelectasis. RIGHT upper lobe nodular density seen previously less well demonstrated on current study. Electronically Signed   By: Lavonia Dana M.D.   On: 12/15/2017 09:30   Dg Hip Unilat With Pelvis 2-3 Views Right  Result Date: 12/13/2017 CLINICAL  DATA:  Fall this morning with right hip pain. EXAM: DG HIP (WITH OR WITHOUT PELVIS) 2-3V RIGHT COMPARISON:  05/07/2016 FINDINGS: There is moderate diffuse osteopenia. Right total hip arthroplasty is intact and normally located. There is a right inferior pubic ramus fracture which appears acute with minimal displacement. Mild degenerate change of the left hip and spine. IMPRESSION: Right inferior pubic ramus fracture with minimal  displacement. Right total hip arthroplasty intact and normally located. Electronically Signed   By: Marin Olp M.D.   On: 12/13/2017 09:12   Xr Pelvis 1-2 Views  Result Date: 12/29/2017 Right hip and AP pelvis and inlet and outlet pelvis radiographs show right inferior pubic ramus fracture and right superior ramus fracture. There is abundant callus forming about the right inner brim of the pelvis along the superior pubic ramus and medial right acetabulum.     Assessment/Plan:   No diagnosis found.   Patient is being discharged with the following home health services:    Patient is being discharged with the following durable medical equipment:    Patient has been advised to f/u with their PCP in 1-2 weeks to bring them up to date on their rehab stay.  Social services at facility was responsible for arranging this appointment.  Pt was provided with a 30 day supply of prescriptions for medications and refills must be obtained from their PCP.  For controlled substances, a more limited supply may be provided adequate until PCP appointment only.  Future labs/tests needed:   Noah Delaine. [name], MD

## 2018-01-05 NOTE — Telephone Encounter (Signed)
Okay for refill? Please advise 

## 2018-01-06 NOTE — Telephone Encounter (Signed)
I addendumed the office note.

## 2018-01-08 NOTE — Telephone Encounter (Signed)
I mailed OV note to [employer]

## 2018-01-15 ENCOUNTER — Encounter: Payer: Self-pay | Admitting: Internal Medicine

## 2018-01-15 NOTE — Progress Notes (Signed)
ocation:  [place] Room Number: [phone] Place of Service:  SNF (31)  [name]. Sarah Coil, MD  Patient Care Team: Marletta Lor, MD as PCP - General Dory Horn, MD as Rounding Team (Internal Medicine) Jacolyn Reedy, MD as Consulting Physician (Cardiology)  Extended Emergency Contact Information Primary Emergency Contact: Warnell Bureau Address: [address]           [place], [place] [address] [name] of [place] Phone: [phone] Work Phone: [phone] Mobile Phone: [phone] Relation: Daughter  Allergies  Allergen Reactions  . Atorvastatin Other (See Comments)    Raises liver enzymes (takes pravastatin at home)  . Fluorometholone     Falling down  . Influenza Vaccines Other (See Comments)    "dizziness"  . Phenytoin Other (See Comments)    Blood clots  . Promethazine Hcl Other (See Comments)    Severe sedation  . Sulfamethoxazole     REACTION: unspecified  Per pt daughter she stated this causes rash    Chief Complaint  Patient presents with  . Discharge Note    Discharge from [facility]    HPI:  84 y.o. female      Past Medical History:  Diagnosis Date  . Abnormal CT of the chest    Stable patchy/nodular and ground-glass opacities in the right upper lobe, While grossly unchanged, low grade adenocarcinoma remains possible.  Marland Kitchen ANEMIA, PERNICIOUS 09/10/2006  . Bradycardia    August, 2013  . CORONARY ARTERY DISEASE 11/07/2008   a. NSTEMI 2001 & STEMI 2008 managed medically. b. Takotsubo 06/2006. c. Cath 2009 -> med rx.  . CVA WITH LEFT HEMIPARESIS 07/22/2007   May, 2009, neurology decided aspirin and Plavix at that time.  . DERMATITIS 03/02/2007  . DJD (degenerative joint disease)    L shoulder impingement  . Ejection fraction    EF 60%, echo, July, 2010, (  EF improved after tach a suitable event 2008)  . Family history of anesthesia complication    daughter has a hard time waking up  . H/O  hiatal hernia   . HYPERTENSION 07/15/2006  . IBS (irritable bowel syndrome)   . MENINGIOMA 08/27/2006   Occipital meningioma, surgery, [facility], 2008 /  MRI, [facility], June, 2011, no change in lesion, history believe the patient had a gamma knife treatment of a right occipital atypical meningioma  . OSTEOARTHRITIS 07/15/2006  . Pelvic fracture ([place])   . PULMONARY NODULE 01/14/2007   [place], December, 2011  . Stroke ([place])   . Subarachnoid hemorrhage following injury ([place]) 2007   Subarachnoid hemorrhage secondary to a fall December, 2007  . Takotsubo syndrome 04/22/2008   MI, May, 2008, question takotsubo event  . THYROID NODULE 01/14/2007    Past Surgical History:  Procedure Laterality Date  . ABDOMINAL HYSTERECTOMY    . BRAIN TUMOR EXCISION     Meningioma; treated at [facility] in 2008  . CHOLECYSTECTOMY    . JOINT REPLACEMENT     rt THR  . SHOULDER SURGERY       reports that she has never smoked. She has never used smokeless tobacco. She reports that she does not drink alcohol or use drugs. Social History   Socioeconomic History  . Marital status: Widowed    Spouse name: Not on file  . Number of children: 4  . Years of education: Not on file  . Highest education level: Not on file  Occupational History    Employer: RETIRED  Social Needs  .  Financial resource strain: Not on file  . Food insecurity:    Worry: Not on file    Inability: Not on file  . Transportation needs:    Medical: Not on file    Non-medical: Not on file  Tobacco Use  . Smoking status: Never Smoker  . Smokeless tobacco: Never Used  Substance and Sexual Activity  . Alcohol use: No    Alcohol/week: 0.0 standard drinks  . Drug use: No  . Sexual activity: Never    Birth control/protection: Surgical  Lifestyle  . Physical activity:    Days per week: Not on file    Minutes per session: Not on file  . Stress: Not on file  Relationships  . Social connections:    Talks on phone: Not on file    Gets  together: Not on file    Attends religious service: Not on file    Active member of club or organization: Not on file    Attends meetings of clubs or organizations: Not on file    Relationship status: Not on file  . Intimate partner violence:    Fear of current or ex partner: Not on file    Emotionally abused: Not on file    Physically abused: Not on file    Forced sexual activity: Not on file  Other Topics Concern  . Not on file  Social History Narrative   Widowed. Lives in [place], [place] with daughter.     Pertinent  Health Maintenance Due  Topic Date Due  . DEXA SCAN  01/23/2018 (Originally 02/21/1989)  . PNA vac Low Risk Adult  Completed    Medications: Allergies as of 01/15/2018      Reactions   Atorvastatin Other (See Comments)   Raises liver enzymes (takes pravastatin at home)   Fluorometholone    Falling down   Influenza Vaccines Other (See Comments)   "dizziness"   Phenytoin Other (See Comments)   Blood clots   Promethazine Hcl Other (See Comments)   Severe sedation   Sulfamethoxazole    REACTION: unspecified Per pt daughter she stated this causes rash      Medication List       Accurate as of January 15, 2018 12:54 PM. Always use your most recent med list.        acetaminophen 325 MG tablet Commonly known as:  TYLENOL Take 2 tablets (650 mg total) by mouth every 6 (six) hours.   bisacodyl 10 MG suppository Commonly known as:  DULCOLAX Place 1 suppository (10 mg total) rectally daily as needed for moderate constipation.   clopidogrel 75 MG tablet Commonly known as:  PLAVIX TAKE 1 TABLET DAILY WITH BREAKFAST   cyclobenzaprine 5 MG tablet Commonly known as:  FLEXERIL Take 5 mg by mouth. take 1/2 tab ( 2.5 mg) po daily after PT ( not ST or OT).   docusate sodium 100 MG capsule Commonly known as:  COLACE Take 1 capsule (100 mg total) by mouth 2 (two) times daily.   Melatonin 1 MG Tabs Take 1 mg by mouth at bedtime as needed (for sleep).     methimazole 5 MG tablet Commonly known as:  TAPAZOLE TAKE 1 TABLET (5 MG TOTALLY) THREE TIMES A WEEK, MON, WED, AND FRI   nitroGLYCERIN 0.4 MG SL tablet Commonly known as:  NITROSTAT Place 1 tablet (0.4 mg total) under the tongue every 5 (five) minutes as needed. For chest pain.   pravastatin 20 MG tablet Commonly known as:  PRAVACHOL  TAKE 1 TABLET DAILY IN THE EVENING   senna 8.6 MG Tabs tablet Commonly known as:  SENOKOT Take 1 tablet (8.6 mg total) by mouth at bedtime.   Vitamin D (Ergocalciferol) 1.25 MG (50000 UT) Caps capsule Commonly known as:  DRISDOL Take 1 capsule (50,000 Units total) by mouth every 7 (seven) days.        Vitals:   01/15/18 1246  BP: 140/73  Pulse: 61  Resp: 20  Temp: (!) 97.1 F (36.2 C)  Weight: 112 lb 6.4 oz (51 kg)  Height: 5\' 4"  (1.626 m)   Body mass index is 19.29 kg/m.  Physical Exam  GENERAL APPEARANCE: Alert, conversant. No acute distress.  HEENT: Unremarkable. RESPIRATORY: Breathing is even, unlabored. Lung sounds are clear   CARDIOVASCULAR: Heart RRR no murmurs, rubs or gallops. No peripheral edema.  GASTROINTESTINAL: Abdomen is soft, non-tender, not distended w/ normal bowel sounds.  NEUROLOGIC: Cranial nerves 2-12 grossly intact. Moves all extremities   Labs reviewed: Basic Metabolic Panel: Recent Labs    06/13/17 0224 06/14/17 0357 12/09/17 1406 12/13/17 0801 12/14/17 0340 12/15/17 0220 12/22/17  NA  --  139 138 137 136 133* 138  K  --  3.6 4.1 4.0 4.4 4.3 4.1  CL  --  104 104 108 105 103  --   CO2  --  27 24 21* 26 24  --   GLUCOSE  --  104* 162* 131* 110* 115*  --   BUN  --  10 33* 38* 27* 27* 22*  CREATININE  --  0.84 0.86 0.84 0.87 0.78 0.7  CALCIUM  --  8.5* 9.0 8.5* 8.6* 8.6*  --   MG 1.5* 1.8 1.9  --   --   --   --    No results found for: [facility] Liver Function Tests: Recent Labs    06/12/17 1954 06/14/17 0357 12/09/17 1406  AST 26 18 14   ALT 15 12* 13  ALKPHOS 89 74 64  BILITOT 0.8 0.7  0.4  PROT 6.3* 5.9* 6.2  ALBUMIN 3.7 3.1* 4.0   Recent Labs    06/12/17 1954  LIPASE 35   No results for input(s): AMMONIA in the last 8760 hours. CBC: Recent Labs    08/11/17 1528 12/09/17 1406 12/13/17 0801 12/14/17 0340 12/15/17 0220 12/22/17  WBC 7.5 17.4* 17.3* 15.4* 16.3* 9.4  NEUTROABS 5.0 15.8* 13.6*  --   --   --   HGB 11.7* 12.5 11.2* 11.6* 12.1 11.3*  HCT 34.9* 38.1 35.9* 37.8 36.8 34*  MCV 89.0 93.2 97.0 96.2 94.4  --   PLT 278.0 230.0 184 201 193 296   Lipid No results for input(s): CHOL, HDL, LDLCALC, TRIG in the last 8760 hours. Cardiac Enzymes: Recent Labs    06/12/17 1954 06/13/17 0224 06/13/17 0733  TROPONINI <0.03 <0.03 <0.03   BNP: No results for input(s): BNP in the last 8760 hours. CBG: No results for input(s): GLUCAP in the last 8760 hours.  Procedures and Imaging Studies During Stay: Xr Pelvis 1-2 Views  Result Date: 12/29/2017 Right hip and AP pelvis and inlet and outlet pelvis radiographs show right inferior pubic ramus fracture and right superior ramus fracture. There is abundant callus forming about the right inner brim of the pelvis along the superior pubic ramus and medial right acetabulum.     Assessment/Plan:   No diagnosis found.   Patient is being discharged with the following home health services:    Patient is being discharged with the  following durable medical equipment:    Patient has been advised to f/u with their PCP in 1-2 weeks to bring them up to date on their rehab stay.  Social services at facility was responsible for arranging this appointment.  Pt was provided with a 30 day supply of prescriptions for medications and refills must be obtained from their PCP.  For controlled substances, a more limited supply may be provided adequate until PCP appointment only.  Future labs/tests needed:   [name]. Sarah Coil, MD  This encounter was created in error - please disregard. This encounter was created in error - please  disregard.

## 2018-01-16 ENCOUNTER — Encounter: Payer: Medicare Other | Admitting: Internal Medicine

## 2018-01-17 NOTE — Progress Notes (Signed)
This encounter was created in error - please disregard.

## 2018-01-19 ENCOUNTER — Encounter: Payer: Self-pay | Admitting: Family Medicine

## 2018-01-19 ENCOUNTER — Ambulatory Visit (INDEPENDENT_AMBULATORY_CARE_PROVIDER_SITE_OTHER): Payer: Medicare Other

## 2018-01-19 ENCOUNTER — Other Ambulatory Visit: Payer: Self-pay

## 2018-01-19 ENCOUNTER — Ambulatory Visit (INDEPENDENT_AMBULATORY_CARE_PROVIDER_SITE_OTHER): Payer: Medicare Other | Admitting: Family Medicine

## 2018-01-19 VITALS — BP 142/84 | HR 99 | Ht 64.0 in

## 2018-01-19 DIAGNOSIS — R059 Cough, unspecified: Secondary | ICD-10-CM

## 2018-01-19 DIAGNOSIS — R600 Localized edema: Secondary | ICD-10-CM

## 2018-01-19 DIAGNOSIS — R05 Cough: Secondary | ICD-10-CM

## 2018-01-19 DIAGNOSIS — R0600 Dyspnea, unspecified: Secondary | ICD-10-CM | POA: Diagnosis not present

## 2018-01-19 NOTE — Progress Notes (Signed)
Subjective:     Patient ID: [name], female   DOB: 1922/12/20, 84 y.o.   MRN: [id number]  HPI Patient has multiple chronic problems including hypertension, history of Takotsubo syndrome, hyperthyroidism, cerebral meningioma, history of CVA, hyperlipidemia.  [facility] is currently at [name] and has been there since November 12.  She is brought in today by family with concerns of 1 week history of increased bilateral leg edema.  She does not have any known history of systolic heart failure.  She had echocardiogram last May which showed ejection fraction 60 to 65% with grade 1 diastolic dysfunction.  No recent reported changes in medications.  Family noted that she had somewhat of a "gurgling "sound with breathing and has also had some increased cough.  No fever.  She was placed on compression garments late last week and is wearing knee-high compression daily.  She has some dyspnea with exertion.  Denies any chest pain.  She has apparently been studied for aspiration risk and was felt to be fairly low.  Family also expressed some concerns that she may have some increased redness left leg.  Again no fever.  Past Medical History:  Diagnosis Date  . Abnormal CT of the chest    Stable patchy/nodular and ground-glass opacities in the right upper lobe, While grossly unchanged, low grade adenocarcinoma remains possible.  Marland Kitchen ANEMIA, PERNICIOUS 09/10/2006  . Bradycardia    August, 2013  . CORONARY ARTERY DISEASE 11/07/2008   a. NSTEMI 2001 & STEMI 2008 managed medically. b. Takotsubo 06/2006. c. Cath 2009 -> med rx.  . CVA WITH LEFT HEMIPARESIS 07/22/2007   May, 2009, neurology decided aspirin and Plavix at that time.  . DERMATITIS 03/02/2007  . DJD (degenerative joint disease)    L shoulder impingement  . Ejection fraction    EF 60%, echo, July, 2010, (  EF improved after tach a suitable event 2008)  . Family history of anesthesia complication    daughter has a hard time waking up  . H/O  hiatal hernia   . HYPERTENSION 07/15/2006  . IBS (irritable bowel syndrome)   . MENINGIOMA 08/27/2006   Occipital meningioma, surgery, [facility], 2008 /  MRI, [place], June, 2011, no change in lesion, history believe the patient had a gamma knife treatment of a right occipital atypical meningioma  . OSTEOARTHRITIS 07/15/2006  . Pelvic fracture ([place])   . PULMONARY NODULE 01/14/2007   [place], December, 2011  . Stroke ([place])   . Subarachnoid hemorrhage following injury ([place]) 2007   Subarachnoid hemorrhage secondary to a fall December, 2007  . Takotsubo syndrome 04/22/2008   MI, May, 2008, question takotsubo event  . THYROID NODULE 01/14/2007   Past Surgical History:  Procedure Laterality Date  . ABDOMINAL HYSTERECTOMY    . BRAIN TUMOR EXCISION     Meningioma; treated at [facility] in 2008  . CHOLECYSTECTOMY    . JOINT REPLACEMENT     rt THR  . SHOULDER SURGERY      reports that she has never smoked. She has never used smokeless tobacco. She reports that she does not drink alcohol or use drugs. family history includes Diabetes in her brother; Heart attack (age of onset: 63) in her brother; Stroke in her brother and sister. Allergies  Allergen Reactions  . Atorvastatin Other (See Comments)    Raises liver enzymes (takes pravastatin at home)  . Fluorometholone     Falling down  . Influenza Vaccines Other (See Comments)    "dizziness"  .  Phenytoin Other (See Comments)    Blood clots  . Promethazine Hcl Other (See Comments)    Severe sedation  . Sulfamethoxazole     REACTION: unspecified  Per pt daughter she stated this causes rash     Review of Systems  Constitutional: Negative for chills and fever.  Respiratory: Positive for cough and shortness of breath.   Cardiovascular: Positive for leg swelling. Negative for chest pain and palpitations.  Gastrointestinal: Negative for abdominal pain.  Genitourinary: Negative for dysuria.  Neurological: Negative for dizziness.   Psychiatric/Behavioral: Negative for confusion.       Objective:   Physical Exam Constitutional:      Appearance: Normal appearance.  Cardiovascular:     Rate and Rhythm: Normal rate and regular rhythm.  Pulmonary:     Comments: Slightly diminished breath sounds and both bases.  No wheezes.  No retractions.  Pulse oximetry 97% Musculoskeletal:     Right lower leg: Edema present.     Left lower leg: Edema present.     Comments: Patient has 1+ pitting edema legs ankles and feet bilaterally.  Very faint erythema to pinkness involving lower legs but no warmth.  No definite cellulitis changes.  Neurological:     Mental Status: She is alert.        Assessment:     84 year old female with multiple comorbidities who is seen with increased bilateral leg edema over the past week and question of some mild increased dyspnea.  ? Acute diastolic heart failure.   We recommend the following    Plan:     -Check further labs with BNP level, basic metabolic panel, CBC -Obtain chest x-ray -Consider low-dose Lasix 20 mg once daily for 5 days until further evaluated -Follow-up promptly for any increased redness, warmth, pain, or any fever left lower extremity  Sarah Post MD [name]

## 2018-01-20 ENCOUNTER — Other Ambulatory Visit: Payer: Self-pay

## 2018-01-20 LAB — BASIC METABOLIC PANEL
BUN: 12 (ref 4–21)
BUN: 13 mg/dL (ref 6–23)
CALCIUM: 8.6 mg/dL (ref 8.4–10.5)
CO2: 26 meq/L (ref 19–32)
CREATININE: 0.7 (ref 0.5–1.1)
Chloride: 102 mEq/L (ref 96–112)
Creatinine, Ser: 0.82 mg/dL (ref 0.40–1.20)
GFR: 69.01 mL/min (ref >60.00)
GLUCOSE: 113
Glucose, Bld: 105 mg/dL — ABNORMAL HIGH (ref 70–99)
POTASSIUM: 4.1 (ref 3.4–5.3)
POTASSIUM: 4.1 meq/L (ref 3.5–5.1)
SODIUM: 137 (ref 137–147)
SODIUM: 137 meq/L (ref 135–145)

## 2018-01-20 LAB — CBC WITH DIFFERENTIAL/PLATELET
Basophils Absolute: 0.1 10*3/uL (ref 0.0–0.1)
Basophils Relative: 1.1 % (ref 0.0–3.0)
EOS ABS: 0.2 10*3/uL (ref 0.0–0.7)
EOS PCT: 2.5 % (ref 0.0–5.0)
HCT: 32.4 % — ABNORMAL LOW (ref 36.0–46.0)
Hemoglobin: 10.7 g/dL — ABNORMAL LOW (ref 12.0–15.0)
Lymphocytes Relative: 16.5 % (ref 12.0–46.0)
Lymphs Abs: 1.6 10*3/uL (ref 0.7–4.0)
MCHC: 33.2 g/dL (ref 30.0–36.0)
MCV: 93.4 fl (ref 78.0–100.0)
MONO ABS: 1.4 10*3/uL — AB (ref 0.1–1.0)
Monocytes Relative: 14.7 % — ABNORMAL HIGH (ref 3.0–12.0)
Neutro Abs: 6.2 10*3/uL (ref 1.4–7.7)
Neutrophils Relative %: 65.2 % (ref 43.0–77.0)
Platelets: 371 10*3/uL (ref 150.0–400.0)
RBC: 3.47 Mil/uL — AB (ref 3.87–5.11)
RDW: 16.1 % — ABNORMAL HIGH (ref 11.5–15.5)
WBC: 9.5 10*3/uL (ref 4.0–10.5)

## 2018-01-20 LAB — BRAIN NATRIURETIC PEPTIDE: Pro B Natriuretic peptide (BNP): 1505 pg/mL — ABNORMAL HIGH (ref 0.0–100.0)

## 2018-01-20 LAB — CBC AND DIFFERENTIAL
HCT: 32 — AB (ref 36–46)
HEMOGLOBIN: 10.9 — AB (ref 12.0–16.0)
Platelets: 335 (ref 150–399)
WBC: 7.8

## 2018-01-20 MED ORDER — LEVOFLOXACIN 500 MG PO TABS: 500.0000 mg | ORAL_TABLET | Freq: Every day | ORAL | 0 refills | Status: DC

## 2018-01-22 ENCOUNTER — Encounter: Payer: Self-pay | Admitting: Internal Medicine

## 2018-01-22 ENCOUNTER — Non-Acute Institutional Stay (SKILLED_NURSING_FACILITY): Payer: Medicare Other | Admitting: Internal Medicine

## 2018-01-22 DIAGNOSIS — S32434A Nondisplaced fracture of anterior column [iliopubic] of right acetabulum, initial encounter for closed fracture: Secondary | ICD-10-CM

## 2018-01-22 DIAGNOSIS — I5181 Takotsubo syndrome: Secondary | ICD-10-CM | POA: Diagnosis not present

## 2018-01-22 DIAGNOSIS — S32434D Nondisplaced fracture of anterior column [iliopubic] of right acetabulum, subsequent encounter for fracture with routine healing: Secondary | ICD-10-CM | POA: Diagnosis not present

## 2018-01-22 DIAGNOSIS — E559 Vitamin D deficiency, unspecified: Secondary | ICD-10-CM

## 2018-01-22 DIAGNOSIS — E785 Hyperlipidemia, unspecified: Secondary | ICD-10-CM

## 2018-01-22 DIAGNOSIS — E0590 Thyrotoxicosis, unspecified without thyrotoxic crisis or storm: Secondary | ICD-10-CM | POA: Diagnosis not present

## 2018-01-22 MED ORDER — DOCUSATE SODIUM 100 MG PO CAPS: 100.0000 mg | ORAL_CAPSULE | Freq: Two times a day (BID) | ORAL | 0 refills | Status: AC

## 2018-01-22 MED ORDER — MELATONIN 1 MG PO TABS: 1.0000 mg | ORAL_TABLET | Freq: Every evening | ORAL | 0 refills | Status: DC | PRN

## 2018-01-22 MED ORDER — NITROGLYCERIN 0.4 MG SL SUBL: 0.4000 mg | SUBLINGUAL_TABLET | SUBLINGUAL | 3 refills | Status: AC | PRN

## 2018-01-22 MED ORDER — PRAVASTATIN SODIUM 20 MG PO TABS: 20.0000 mg | ORAL_TABLET | Freq: Every evening | ORAL | 4 refills | Status: AC

## 2018-01-22 MED ORDER — CYCLOBENZAPRINE HCL 5 MG PO TABS: 5.0000 mg | ORAL_TABLET | Freq: Every day | ORAL | 0 refills | Status: DC | PRN

## 2018-01-22 MED ORDER — METHIMAZOLE 5 MG PO TABS: ORAL_TABLET | ORAL | 4 refills | Status: AC

## 2018-01-22 MED ORDER — CLOPIDOGREL BISULFATE 75 MG PO TABS: 75.0000 mg | ORAL_TABLET | Freq: Every day | ORAL | 3 refills | Status: AC

## 2018-01-22 MED ORDER — LEVOFLOXACIN 500 MG PO TABS: 500.0000 mg | ORAL_TABLET | Freq: Every day | ORAL | 0 refills | Status: AC

## 2018-01-22 MED ORDER — SENNA 8.6 MG PO TABS: 1.0000 | ORAL_TABLET | Freq: Every evening | ORAL | 0 refills | Status: AC | PRN

## 2018-01-22 MED ORDER — VITAMIN D (ERGOCALCIFEROL) 1.25 MG (50000 UNIT) PO CAPS: 50000.0000 [IU] | ORAL_CAPSULE | ORAL | 0 refills | Status: DC

## 2018-01-22 NOTE — Progress Notes (Signed)
Location:  [facility] Room Number: [phone] Place of Service:  SNF [phone])  [name]. [name], MD  Patient Care Team: Marletta Lor, MD as PCP - General Dory Horn, MD as Rounding Team (Internal Medicine) Jacolyn Reedy, MD as Consulting Physician (Cardiology)  Extended Emergency Contact Information Primary Emergency Contact: Warnell Bureau Address: [address]           [place], [place] [address] [name] of [place] Phone: [phone] Work Phone: [phone] Mobile Phone: [phone] Relation: Daughter  Allergies  Allergen Reactions  . Atorvastatin Other (See Comments)    Raises liver enzymes (takes pravastatin at home)  . Fluorometholone     Falling down  . Influenza Vaccines Other (See Comments)    "dizziness"  . Phenytoin Other (See Comments)    Blood clots  . Promethazine Hcl Other (See Comments)    Severe sedation  . Sulfamethoxazole     REACTION: unspecified  Per pt daughter she stated this causes rash    Chief Complaint  Patient presents with  . Discharge Note    Discharge from [facility]    HPI:  84 y.o. female with stroke, OA, IBS, hypertension, CAD, anemia, hypothyroidism, and most recently meningioma status post gamma knife surgery at [facility] with brain edema after which led her to be on Decadron which she has been on chronically since initial surgery on 2008, recurrence 2010 and gamma knife on 6/26 2019.  She presented to the ED on the day of admission after a fall in the early hour hours of the morning which led to right hip pain and inability to walk.  She had no other symptoms.  She had been getting weight on steroids which she does not like and she had recently been seen by her family practitioner for dizziness which she felt to be vertigo.  Imaging showed a pelvic fracture and a right acetabular fracture.  Patient was admitted to [facility] from 11/9-12 where her fracture was  treated nonoperatively.  There were no complications.  Patient was admitted to skilled nursing facility for OT/PT and is now ready to be discharged to home.    Past Medical History:  Diagnosis Date  . Abnormal CT of the chest    Stable patchy/nodular and ground-glass opacities in the right upper lobe, While grossly unchanged, low grade adenocarcinoma remains possible.  Marland Kitchen ANEMIA, PERNICIOUS 09/10/2006  . Bradycardia    August, 2013  . CORONARY ARTERY DISEASE 11/07/2008   a. NSTEMI 2001 & STEMI 2008 managed medically. b. Takotsubo 06/2006. c. Cath 2009 -> med rx.  . CVA WITH LEFT HEMIPARESIS 07/22/2007   May, 2009, neurology decided aspirin and Plavix at that time.  . DERMATITIS 03/02/2007  . DJD (degenerative joint disease)    L shoulder impingement  . Ejection fraction    EF 60%, echo, July, 2010, (  EF improved after tach a suitable event 2008)  . Family history of anesthesia complication    daughter has a hard time waking up  . H/O hiatal hernia   . HYPERTENSION 07/15/2006  . IBS (irritable bowel syndrome)   . MENINGIOMA 08/27/2006   Occipital meningioma, surgery, [facility], 2008 /  MRI, [facility], June, 2011, no change in lesion, history believe the patient had a gamma knife treatment of a right occipital atypical meningioma  . OSTEOARTHRITIS 07/15/2006  . Pelvic fracture ([place])   . PULMONARY NODULE 01/14/2007   [place], December, 2011  . Stroke ([place])   .  Subarachnoid hemorrhage following injury ([place]) 2007   Subarachnoid hemorrhage secondary to a fall December, 2007  . Takotsubo syndrome 04/22/2008   MI, May, 2008, question takotsubo event  . THYROID NODULE 01/14/2007    Past Surgical History:  Procedure Laterality Date  . ABDOMINAL HYSTERECTOMY    . BRAIN TUMOR EXCISION     Meningioma; treated at [facility] in 2008  . CHOLECYSTECTOMY    . JOINT REPLACEMENT     rt THR  . SHOULDER SURGERY       reports that she has never smoked. She has never used smokeless tobacco. She reports that  she does not drink alcohol or use drugs. Social History   Socioeconomic History  . Marital status: Widowed    Spouse name: Not on file  . Number of children: 4  . Years of education: Not on file  . Highest education level: Not on file  Occupational History    Employer: RETIRED  Social Needs  . Financial resource strain: Not on file  . Food insecurity:    Worry: Not on file    Inability: Not on file  . Transportation needs:    Medical: Not on file    Non-medical: Not on file  Tobacco Use  . Smoking status: Never Smoker  . Smokeless tobacco: Never Used  Substance and Sexual Activity  . Alcohol use: No    Alcohol/week: 0.0 standard drinks  . Drug use: No  . Sexual activity: Never    Birth control/protection: Surgical  Lifestyle  . Physical activity:    Days per week: Not on file    Minutes per session: Not on file  . Stress: Not on file  Relationships  . Social connections:    Talks on phone: Not on file    Gets together: Not on file    Attends religious service: Not on file    Active member of club or organization: Not on file    Attends meetings of clubs or organizations: Not on file    Relationship status: Not on file  . Intimate partner violence:    Fear of current or ex partner: Not on file    Emotionally abused: Not on file    Physically abused: Not on file    Forced sexual activity: Not on file  Other Topics Concern  . Not on file  Social History Narrative   Widowed. Lives in [place], [place] with daughter.     Pertinent  Health Maintenance Due  Topic Date Due  . DEXA SCAN  01/23/2018 (Originally 02/21/1989)  . PNA vac Low Risk Adult  Completed    Medications: Allergies as of 01/22/2018      Reactions   Atorvastatin Other (See Comments)   Raises liver enzymes (takes pravastatin at home)   Fluorometholone    Falling down   Influenza Vaccines Other (See Comments)   "dizziness"   Phenytoin Other (See Comments)   Blood clots   Promethazine Hcl Other  (See Comments)   Severe sedation   Sulfamethoxazole    REACTION: unspecified Per pt daughter she stated this causes rash      Medication List       Accurate as of January 22, 2018  4:16 PM. Always use your most recent med list.        acetaminophen 325 MG tablet Commonly known as:  TYLENOL Take 2 tablets (650 mg total) by mouth every 6 (six) hours.   bisacodyl 10 MG suppository Commonly known as:  DULCOLAX Place  1 suppository (10 mg total) rectally daily as needed for moderate constipation.   clopidogrel 75 MG tablet Commonly known as:  PLAVIX Take 1 tablet (75 mg total) by mouth daily with breakfast.   cyclobenzaprine 5 MG tablet Commonly known as:  FLEXERIL Take 1 tablet (5 mg total) by mouth daily as needed for muscle spasms. take 1/2 tab ( 2.5 mg) po daily after PT ( not ST or OT).   docusate sodium 100 MG capsule Commonly known as:  COLACE Take 1 capsule (100 mg total) by mouth 2 (two) times daily.   levofloxacin 500 MG tablet Commonly known as:  LEVAQUIN Take 1 tablet (500 mg total) by mouth daily for 9 days.   Melatonin 1 MG Tabs Take 1 tablet (1 mg total) by mouth at bedtime as needed (for sleep).   methimazole 5 MG tablet Commonly known as:  TAPAZOLE TAKE 1 TABLET (5 MG TOTALLY) THREE TIMES A WEEK, MON, WED, AND FRI   nitroGLYCERIN 0.4 MG SL tablet Commonly known as:  NITROSTAT Place 1 tablet (0.4 mg total) under the tongue every 5 (five) minutes as needed. For chest pain.   pravastatin 20 MG tablet Commonly known as:  PRAVACHOL Take 1 tablet (20 mg total) by mouth every evening.   senna 8.6 MG Tabs tablet Commonly known as:  SENOKOT Take 1 tablet (8.6 mg total) by mouth at bedtime as needed for mild constipation.   Vitamin D (Ergocalciferol) 1.25 MG (50000 UT) Caps capsule Commonly known as:  DRISDOL Take 1 capsule (50,000 Units total) by mouth every 7 (seven) days.        Vitals:   01/22/18 1229  BP: 135/67  Pulse: (!) 59  Resp: 18    Temp: 97.8 F (36.6 C)  Weight: 112 lb 6.4 oz (51 kg)  Height: 5\' 4"  (1.626 m)   Body mass index is 19.29 kg/m.  Physical Exam  GENERAL APPEARANCE: Alert, conversant. No acute distress.  HEENT: Unremarkable. RESPIRATORY: Breathing is even, unlabored. Lung sounds are clear   CARDIOVASCULAR: Heart RRR no murmurs, rubs or gallops. No peripheral edema.  GASTROINTESTINAL: Abdomen is soft, non-tender, not distended w/ normal bowel sounds.  NEUROLOGIC: Cranial nerves 2-12 grossly intact. Moves all extremities   Labs reviewed: Basic Metabolic Panel: Recent Labs    06/13/17 0224 06/14/17 0357 12/09/17 1406  12/14/17 0340 12/15/17 0220 12/22/17 01/19/18 1543 01/20/18  NA  --  139 138   < > 136 133* 138 137 137  K  --  3.6 4.1   < > 4.4 4.3 4.1 4.1 4.1  CL  --  104 104   < > 105 103  --  102  --   CO2  --  27 24   < > 26 24  --  26  --   GLUCOSE  --  104* 162*   < > 110* 115*  --  105*  --   BUN  --  10 33*   < > 27* 27* 22* 13 12  CREATININE  --  0.84 0.86   < > 0.87 0.78 0.7 0.82 0.7  CALCIUM  --  8.5* 9.0   < > 8.6* 8.6*  --  8.6  --   MG 1.5* 1.8 1.9  --   --   --   --   --   --    < > = values in this interval not displayed.   No results found for: [facility] Liver Function Tests: Recent Labs  06/12/17 1954 06/14/17 0357 12/09/17 1406  AST 26 18 14   ALT 15 12* 13  ALKPHOS 89 74 64  BILITOT 0.8 0.7 0.4  PROT 6.3* 5.9* 6.2  ALBUMIN 3.7 3.1* 4.0   Recent Labs    06/12/17 1954  LIPASE 35   No results for input(s): AMMONIA in the last 8760 hours. CBC: Recent Labs    12/09/17 1406 12/13/17 0801 12/14/17 0340 12/15/17 0220 12/22/17 01/19/18 1543 01/20/18  WBC 17.4* 17.3* 15.4* 16.3* 9.4 9.5 7.8  NEUTROABS 15.8* 13.6*  --   --   --  6.2  --   HGB 12.5 11.2* 11.6* 12.1 11.3* 10.7* 10.9*  HCT 38.1 35.9* 37.8 36.8 34* 32.4* 32*  MCV 93.2 97.0 96.2 94.4  --  93.4  --   PLT 230.0 184 201 193 296 371.0 335   Lipid No results for input(s): CHOL, HDL, LDLCALC,  TRIG in the last 8760 hours. Cardiac Enzymes: Recent Labs    06/12/17 1954 06/13/17 0224 06/13/17 0733  TROPONINI <0.03 <0.03 <0.03   BNP: No results for input(s): BNP in the last 8760 hours. CBG: No results for input(s): GLUCAP in the last 8760 hours.  Procedures and Imaging Studies During Stay: Dg Chest 2 View  Result Date: 01/20/2018 CLINICAL DATA:  Cough. EXAM: CHEST - 2 VIEW COMPARISON:  12/15/2017, 08/13/2013, 04/29/2012.  CT 01/30/2013. FINDINGS: Mediastinum hilar structures are normal. Cardiomegaly with diffuse bilateral from interstitial prominence and bilateral pleural effusions consistent CHF. Right upper lobe infiltrate. This suggest pneumonia. Underlying mass lesion can not be excluded. Follow-up exams demonstrate clearing suggested. Degenerative changes scoliosis thoracic spine. IMPRESSION: 1. Congestive heart failure bilateral from interstitial edema bilateral pleural effusions. 2. Ill-defined right upper lobe infiltrate noted in area previously identified scarring. This suggest pneumonia. Underlying mass lesion can not be excluded. Followup PA and lateral chest X-ray is recommended in 3-4 weeks following trial of antibiotic therapy to ensure resolution and exclude underlying malignancy. Electronically Signed   By: Marcello Moores  Register   On: 01/20/2018 06:53   Xr Pelvis 1-2 Views  Result Date: 12/29/2017 Right hip and AP pelvis and inlet and outlet pelvis radiographs show right inferior pubic ramus fracture and right superior ramus fracture. There is abundant callus forming about the right inner brim of the pelvis along the superior pubic ramus and medial right acetabulum.     Assessment/Plan:   No diagnosis found.   Patient is being discharged with the following home health services: OT/PT/nursing  Patient is being discharged with the following durable medical equipment: Lightweight wheelchair  Patient has been advised to f/u with their PCP in 1-2 weeks to bring them up  to date on their rehab stay.  Social services at facility was responsible for arranging this appointment.  Pt was provided with a 30 day supply of prescriptions for medications and refills must be obtained from their PCP.  For controlled substances, a more limited supply may be provided adequate until PCP appointment only.  Medications have been reconciled.  I am spent greater than 30 minutes;> 50% of time with patient was spent reviewing records, labs, tests and studies, counseling and developing plan of care  [name]. [name], MD

## 2018-01-25 DIAGNOSIS — E559 Vitamin D deficiency, unspecified: Secondary | ICD-10-CM | POA: Insufficient documentation

## 2018-01-26 ENCOUNTER — Ambulatory Visit (INDEPENDENT_AMBULATORY_CARE_PROVIDER_SITE_OTHER): Payer: Medicare Other

## 2018-01-26 ENCOUNTER — Telehealth: Payer: Self-pay | Admitting: Internal Medicine

## 2018-01-26 ENCOUNTER — Encounter: Payer: Self-pay | Admitting: Family Medicine

## 2018-01-26 ENCOUNTER — Ambulatory Visit (INDEPENDENT_AMBULATORY_CARE_PROVIDER_SITE_OTHER): Payer: Medicare Other | Admitting: Family Medicine

## 2018-01-26 ENCOUNTER — Other Ambulatory Visit: Payer: Self-pay

## 2018-01-26 VITALS — BP 102/62 | HR 77 | Temp 97.8°F

## 2018-01-26 DIAGNOSIS — I252 Old myocardial infarction: Secondary | ICD-10-CM | POA: Diagnosis not present

## 2018-01-26 DIAGNOSIS — M25552 Pain in left hip: Secondary | ICD-10-CM | POA: Diagnosis not present

## 2018-01-26 DIAGNOSIS — M79605 Pain in left leg: Secondary | ICD-10-CM | POA: Diagnosis not present

## 2018-01-26 DIAGNOSIS — Z8673 Personal history of transient ischemic attack (TIA), and cerebral infarction without residual deficits: Secondary | ICD-10-CM | POA: Diagnosis not present

## 2018-01-26 DIAGNOSIS — M1990 Unspecified osteoarthritis, unspecified site: Secondary | ICD-10-CM | POA: Diagnosis not present

## 2018-01-26 DIAGNOSIS — J181 Lobar pneumonia, unspecified organism: Secondary | ICD-10-CM

## 2018-01-26 DIAGNOSIS — M25562 Pain in left knee: Secondary | ICD-10-CM

## 2018-01-26 DIAGNOSIS — I509 Heart failure, unspecified: Secondary | ICD-10-CM | POA: Insufficient documentation

## 2018-01-26 DIAGNOSIS — E039 Hypothyroidism, unspecified: Secondary | ICD-10-CM | POA: Diagnosis not present

## 2018-01-26 DIAGNOSIS — I69354 Hemiplegia and hemiparesis following cerebral infarction affecting left non-dominant side: Secondary | ICD-10-CM | POA: Diagnosis not present

## 2018-01-26 DIAGNOSIS — H9190 Unspecified hearing loss, unspecified ear: Secondary | ICD-10-CM | POA: Diagnosis not present

## 2018-01-26 DIAGNOSIS — I2510 Atherosclerotic heart disease of native coronary artery without angina pectoris: Secondary | ICD-10-CM | POA: Diagnosis not present

## 2018-01-26 DIAGNOSIS — J189 Pneumonia, unspecified organism: Secondary | ICD-10-CM | POA: Insufficient documentation

## 2018-01-26 DIAGNOSIS — Z7902 Long term (current) use of antithrombotics/antiplatelets: Secondary | ICD-10-CM | POA: Diagnosis not present

## 2018-01-26 DIAGNOSIS — D649 Anemia, unspecified: Secondary | ICD-10-CM | POA: Diagnosis not present

## 2018-01-26 DIAGNOSIS — D329 Benign neoplasm of meninges, unspecified: Secondary | ICD-10-CM | POA: Diagnosis not present

## 2018-01-26 DIAGNOSIS — I10 Essential (primary) hypertension: Secondary | ICD-10-CM | POA: Diagnosis not present

## 2018-01-26 DIAGNOSIS — S32401D Unspecified fracture of right acetabulum, subsequent encounter for fracture with routine healing: Secondary | ICD-10-CM | POA: Diagnosis not present

## 2018-01-26 DIAGNOSIS — Z96641 Presence of right artificial hip joint: Secondary | ICD-10-CM | POA: Diagnosis not present

## 2018-01-26 NOTE — Progress Notes (Signed)
Subjective:     Patient ID: [name], female   DOB: 1922-10-19, 84 y.o.   MRN: [id number]  HPI Patient is seen today accompanied by daughter.  We had seen her in cross coverage here on the 16th.  Refer to that note for details.  She has had some recent bilateral leg edema and some dyspnea with exertion.  We obtained chest x-ray which showed concern for some pulmonary edema and also BNP level over 1500.  Hemoglobin 10.7.  Electrolytes stable.  Chest x-ray was over read and there was also concern for possible right upper lobe pneumonia.  We treated her with Levaquin for 1 week.  She has had some cough but no fever.  Her leg edema has improved after starting Lasix 20 mg daily.  She took this for 5 days and then this was discontinued.  She was just discharged from [facility] rehab last week and is back home with family.  Up until about a month ago she was living fairly independently until she had a fall with right hip fracture.  She has had previous right total hip replacement.  She has recently been complaining of some left hip and knee pain and not ambulating.  She has home physical therapy.  Pending follow up with her orthopedist 02-05-17.    She is not sleeping well at night and they currently have her staying in a lounge chair.  She has had big decline over the past month.  We had put in referral this morning to palliative care.  She is still waiting to establish with new primary provider here.  Past Medical History:  Diagnosis Date  . Abnormal CT of the chest    Stable patchy/nodular and ground-glass opacities in the right upper lobe, While grossly unchanged, low grade adenocarcinoma remains possible.  Marland Kitchen ANEMIA, PERNICIOUS 09/10/2006  . Bradycardia    August, 2013  . CORONARY ARTERY DISEASE 11/07/2008   a. NSTEMI 2001 & STEMI 2008 managed medically. b. Takotsubo 06/2006. c. Cath 2009 -> med rx.  . CVA WITH LEFT HEMIPARESIS 07/22/2007   May, 2009, neurology decided aspirin and Plavix at that  time.  . DERMATITIS 03/02/2007  . DJD (degenerative joint disease)    L shoulder impingement  . Ejection fraction    EF 60%, echo, July, 2010, (  EF improved after tach a suitable event 2008)  . Family history of anesthesia complication    daughter has a hard time waking up  . H/O hiatal hernia   . HYPERTENSION 07/15/2006  . IBS (irritable bowel syndrome)   . MENINGIOMA 08/27/2006   Occipital meningioma, surgery, [facility], 2008 /  MRI, [place], June, 2011, no change in lesion, history believe the patient had a gamma knife treatment of a right occipital atypical meningioma  . OSTEOARTHRITIS 07/15/2006  . Pelvic fracture ([place])   . PULMONARY NODULE 01/14/2007   [place], December, 2011  . Stroke ([place])   . Subarachnoid hemorrhage following injury ([place]) 2007   Subarachnoid hemorrhage secondary to a fall December, 2007  . Takotsubo syndrome 04/22/2008   MI, May, 2008, question takotsubo event  . THYROID NODULE 01/14/2007   Past Surgical History:  Procedure Laterality Date  . ABDOMINAL HYSTERECTOMY    . BRAIN TUMOR EXCISION     Meningioma; treated at [facility] in 2008  . CHOLECYSTECTOMY    . JOINT REPLACEMENT     rt THR  . SHOULDER SURGERY      reports that she has never smoked. She has never  used smokeless tobacco. She reports that she does not drink alcohol or use drugs. family history includes Diabetes in her brother; Heart attack (age of onset: 43) in her brother; Stroke in her brother and sister. Allergies  Allergen Reactions  . Atorvastatin Other (See Comments)    Raises liver enzymes (takes pravastatin at home)  . Fluorometholone     Falling down  . Influenza Vaccines Other (See Comments)    "dizziness"  . Phenytoin Other (See Comments)    Blood clots  . Promethazine Hcl Other (See Comments)    Severe sedation  . Sulfamethoxazole     REACTION: unspecified  Per pt daughter she stated this causes rash     Review of Systems  Constitutional: Negative for chills and fever.   Respiratory: Positive for cough and shortness of breath.   Cardiovascular: Negative for chest pain and leg swelling.  Gastrointestinal: Negative for abdominal pain.  Genitourinary: Negative for dysuria.       Objective:   Physical Exam Constitutional:      Appearance: Normal appearance.  Cardiovascular:     Rate and Rhythm: Normal rate and regular rhythm.  Pulmonary:     Effort: Pulmonary effort is normal.     Breath sounds: Normal breath sounds.  Neurological:     Mental Status: She is alert.        Assessment:     #1 Patient is a 84 year old who has had progressive decline over the past month or so after fall with right hip injury which has been evaluated by orthopedics.  She is now complaining of some left knee and hip pains  #2 recent progressive lower extremity edema with x-ray showing pulmonary edema with BNP level over 1500.  No history of systolic dysfunction by previous echocardiogram  #3 question of recent right lobe pneumonia-treated with Levaquin.  Afebrile.    Plan:     -Obtain x-rays of the left knee and left hip -Patient has home physical therapy set up -We wrote order this morning for hospital bed and also have recommended consideration for palliative care consult and family agree on this -Knee x-ray shows osteopenic appearing bones- but no acute bony abnormality seen - hip films show normal alignment of previous right THR.  No acute left hip bony abnormality.  Eulas Post MD [facility]

## 2018-01-26 NOTE — Patient Instructions (Signed)
Finish out the [place].  We have placed Palliative care consult  Watch for any increased leg edema or increased shortness of breath.

## 2018-01-26 NOTE — Telephone Encounter (Signed)
Please advise 

## 2018-01-26 NOTE — Telephone Encounter (Signed)
Called patient and left a detailed voice message on voice mail.  Ok for [facility] to Discuss results / PCP / recommendations / Schedule patient  Per Dr. Elease Hashimoto patient is establishing with Dr. Jerilee Hoh and he feels Palliative Care referral is appropriate and he has given the written OK for the hospital bed order.

## 2018-01-26 NOTE — Telephone Encounter (Signed)
Copied from [place] [id number]. Topic: Quick Communication - Home Health Verbal Orders >> Jan 26, 2018  9:04 AM Conception Chancy, NT wrote: Caller/Agency: Tharon Aquas PT with [facility] Number: [phone] can be left  Requesting OT/PT/Skilled Nursing/Social Work: PT Frequency: 1 week for 3 weeks.    Tharon Aquas would like Dr. Elease Hashimoto to consider a referral for palliative or hospice care after today's visit. Also family and Tharon Aquas requesting a hospital bed.

## 2018-01-26 NOTE — Telephone Encounter (Signed)
She will be establishing with Dr Jerilee Hoh, but I do think Palliative Care referral is appropriate and would go ahead and initiate.  Hospital bed OK to order.

## 2018-01-27 DIAGNOSIS — R0600 Dyspnea, unspecified: Secondary | ICD-10-CM | POA: Diagnosis not present

## 2018-01-27 DIAGNOSIS — S32401D Unspecified fracture of right acetabulum, subsequent encounter for fracture with routine healing: Secondary | ICD-10-CM | POA: Diagnosis not present

## 2018-01-27 DIAGNOSIS — Z8673 Personal history of transient ischemic attack (TIA), and cerebral infarction without residual deficits: Secondary | ICD-10-CM | POA: Diagnosis not present

## 2018-01-27 DIAGNOSIS — Z9181 History of falling: Secondary | ICD-10-CM | POA: Diagnosis not present

## 2018-01-27 NOTE — Telephone Encounter (Signed)
Patients daughter Kalman Shan states that [facility] will be faxing over paperwork to be filled out regarding hospital bed, as the approval cannot be verbal.

## 2018-01-29 ENCOUNTER — Telehealth: Payer: Self-pay

## 2018-01-29 ENCOUNTER — Telehealth: Payer: Self-pay | Admitting: Internal Medicine

## 2018-01-29 NOTE — Telephone Encounter (Signed)
Copied from [place] [id number]. Topic: Quick Communication - Home Health Verbal Orders >> Jan 29, 2018 10:26 AM Virl Axe D wrote: Caller/Agency: [facility] / [facility] Number: [phone] Requesting OT/PT/Skilled Nursing/Social Work: OT Frequency: 1 week 1 2 week 2

## 2018-01-29 NOTE — Telephone Encounter (Signed)
Copied from [place] [id number]. Topic: Quick Communication - Home Health Verbal Orders >> Jan 29, 2018  8:39 AM Bea Graff, NT wrote: Caller/Agency: [facility] Number: [phone] Requesting OT/PT/Skilled Nursing/Social Work: Nursing Frequency: 1 time a week for 8 weeks

## 2018-01-30 NOTE — Telephone Encounter (Signed)
We received 2 different forms for the hospital bed for [place] and both of these have been faxed.

## 2018-01-30 NOTE — Telephone Encounter (Signed)
[facility] is calling to state they will be re-fax paperwork for the patient Hospital bed request. Please advise

## 2018-02-02 ENCOUNTER — Other Ambulatory Visit: Payer: Self-pay

## 2018-02-02 DIAGNOSIS — Z9181 History of falling: Secondary | ICD-10-CM | POA: Diagnosis not present

## 2018-02-02 DIAGNOSIS — S32401D Unspecified fracture of right acetabulum, subsequent encounter for fracture with routine healing: Secondary | ICD-10-CM | POA: Diagnosis not present

## 2018-02-02 DIAGNOSIS — Z8673 Personal history of transient ischemic attack (TIA), and cerebral infarction without residual deficits: Secondary | ICD-10-CM | POA: Diagnosis not present

## 2018-02-02 DIAGNOSIS — R0609 Other forms of dyspnea: Secondary | ICD-10-CM | POA: Diagnosis not present

## 2018-02-02 NOTE — Patient Outreach (Signed)
[facility]) Care Management  02/02/2018  [name] 1922/08/18 [id number]  EMMI: general discharge red alertReferral date:  Referral reason: lost interest in things: yes,   Sad/ hopeless/anxious/ empty: yes Discharged from [facility] and rehab Day # 4  Telephone call to patient regarding EMMI general discharge red alert. Spoke with patients daughter and health care power of attorney, [name]. HIPAA verified for patient. Explained reason for call. Daughter states patient is frustrated and down because she is unable to get around as far as helping herself to the bathroom. Daughter states patient was in the hospital and nursing home due to a fall and fracturing her right pelvic area. Daughter states patient is having problem out of her left leg.  Daughter states it looks like patient has lost some muscle mass in the leg and she can hardly hold herself up.  She states patient is dragging also the leg. Daughter states home health therapy has seen patient but patient is having a difficult time with the leg.  She states patient has a follow up appointment with the orthopedic doctor on tomorrow. Daughter states patient has a hospital bed that will be delivered today.  Daughter states patient has a " touch of pneumonia."   Daughter states patient has seen her primary MD since being discharged from the skilled nursing facility. Daughter states patient will see her new primary MD, Dr. Lelon Frohlich for follow up 02/13/18.   Daughter states patient has her medication and is taking it as prescribed. Daughter states she has a wheelchair to transport patient as needed. She states she transports patient to her doctor appointments. Daughter states patient has been referred to palliative care. She states she is waiting to hear from them .  Daughter states patient is a holocaust survivor. Daughter denies any further needs at this time.  RNCM advised patient to notify MD of any  changes in condition prior to scheduled appointment. RNCM provided contact name and number: [phone] or main office number [phone] and 24 hour nurse advise line [phone].  RNCM verified patient aware of 911 services for urgent/ emergent needs.   PLAN: RNCM will close patient due to patient being assessed and having no further needs.  RNCM will send patient [facility] care management brochure/ magnet.  RNCM will send closure notification to patients primary md.   Quinn Plowman RN,BSN,CCM [facility] Telephonic  [phone]

## 2018-02-02 NOTE — Telephone Encounter (Signed)
Patient has an appointment to establish care 02/13/18.  Okay to give orders?

## 2018-02-03 ENCOUNTER — Ambulatory Visit (INDEPENDENT_AMBULATORY_CARE_PROVIDER_SITE_OTHER): Payer: Medicare Other | Admitting: Orthopaedic Surgery

## 2018-02-03 ENCOUNTER — Encounter (INDEPENDENT_AMBULATORY_CARE_PROVIDER_SITE_OTHER): Payer: Self-pay | Admitting: Orthopaedic Surgery

## 2018-02-03 VITALS — BP 131/74 | HR 75 | Ht 64.0 in | Wt 112.0 lb

## 2018-02-03 DIAGNOSIS — M1612 Unilateral primary osteoarthritis, left hip: Secondary | ICD-10-CM | POA: Diagnosis not present

## 2018-02-03 DIAGNOSIS — M25552 Pain in left hip: Secondary | ICD-10-CM | POA: Diagnosis not present

## 2018-02-03 DIAGNOSIS — I69959 Hemiplegia and hemiparesis following unspecified cerebrovascular disease affecting unspecified side: Secondary | ICD-10-CM

## 2018-02-03 NOTE — Progress Notes (Signed)
Office Visit Note   Patient: [name]           Date of Birth: Jun 23, 1922           MRN: [id number] Visit Date: 02/03/2018              Requested by: Marletta Lor, MD [place], [place] [address] PCP: Isaac Bliss, Rayford Halsted, MD   Assessment & Plan: Visit Diagnoses:  1. Left hip pain   2. Hemiplegia as late effect of cerebrovascular disease, unspecified cerebrovascular disease type, unspecified hemiplegia type, unspecified laterality ([place])   3. Unilateral primary osteoarthritis, left hip     Plan: Ambulation difficulties likely related to recurrent meningioma problem recently treated with gamma knife with some left-sided hemi-para-cysts.  Right acetabular and inferior pubic rami fractures healed by follow-up x-rays.  We will set her up for an intra-articular injection left hip under fluoroscopy see if this helps any with her gait.  It certainly will not help dragging of her left leg but she may get some pain relief.  Office follow-up 6 weeks for recheck.  They will continue to do the best they can since she is staying with her family as far as mobility and ambulation.  Follow-Up Instructions: Return in about 6 weeks (around 03/17/2018).   Orders:  Orders Placed This Encounter  Procedures  . Ambulatory referral to Physical Medicine Rehab   No orders of the defined types were placed in this encounter.     Procedures: No procedures performed   Clinical Data: No additional findings.   Subjective: Chief Complaint  Patient presents with  . Right Hip - Fracture, Follow-up  . Left Hip - Pain  . Left Knee - Pain    HPI 84 year old female who had previous right hip total hip arthroplasty by me greater than 10 years ago.  She had a fall mid October was in the hospital with CT pelvis 12/13/2017 showin right acetabular fracture involving quadrilateral plate with satisfactory healing.  Right inferior pubic rami fracture 9 mm displaced which healed.   Moderate left hip degenerative arthritis.  She has had pain when she tries to stand and walk and drags her left leg.  She had gamma knife treatment for meningioma at [facility] in June.  Right parietal-occipital cortical and subcortical encephalomalacia was noted on MRI scan 06/14/2017 with previous craniotomy for meningioma resection.  When she tries to get up she needs assistance prior to the summer she was able to get up and ambulate on her own using a walker back and forth to the bathroom and was independently ambulating.  Review of Systems positive for hemiaplasia related to CVA, meningioma.  Previous right total hip arthroplasty fall with pelvic and pubic rami fracture which is healed.   Objective: Vital Signs: BP 131/74   Pulse 75   Ht 5\' 4"  (1.626 m)   Wt 112 lb (50.8 kg)   BMI 19.22 kg/m   Physical Exam Constitutional:      Appearance: She is well-developed.  HENT:     Head: Normocephalic.     Right Ear: External ear normal.     Left Ear: External ear normal.  Eyes:     Pupils: Pupils are equal, round, and reactive to light.  Neck:     Thyroid: No thyromegaly.     Trachea: No tracheal deviation.  Cardiovascular:     Rate and Rhythm: Normal rate.  Pulmonary:     Effort: Pulmonary effort is normal.  Abdominal:     Palpations: Abdomen is soft.  Skin:    General: Skin is warm and dry.  Neurological:     Mental Status: She is alert and oriented to person, place, and time.  Psychiatric:        Behavior: Behavior normal.     Ortho Exam patient has distal left thigh pain with internal and external rotation left hip limited to 10 degrees internal and 30 degrees external rotation which reproduces a component of the pain that she has been having.  She has some pelvic obliquity when she steps stand.  Distal pulses palpable.  Specialty Comments:  No specialty comments available.  Imaging: No results found.   PMFS History: Patient Active Problem List   Diagnosis  Date Noted  . Unilateral primary osteoarthritis, left hip 02/03/2018  . Community acquired pneumonia of right upper lobe of lung ([place]) 01/26/2018  . Congestive heart failure (CHF) ([place]) 01/26/2018  . Vitamin D deficiency 01/25/2018  . Acetabular fracture ([place]) 12/19/2017  . Pelvic fracture ([place]) 12/13/2017  . Protein-calorie malnutrition, severe 06/13/2017  . Fall 06/12/2017  . Headache 09/25/2016  . History of stroke 11/15/2014  . History of meningioma 11/15/2014  . GERD (gastroesophageal reflux disease) 11/19/2011  . Dysphagia 11/18/2011  . Palpitations 11/17/2011  . Encounter for long-term (current) use of other medications 10/29/2011  . Hyperthyroidism 10/14/2011  . Vertigo 10/14/2011  . Bradycardia   . Subarachnoid hemorrhage following injury ([place])   . Hyperlipidemia 03/05/2011  . Takotsubo syndrome 04/22/2008  . Hemiplegia, late effect of cerebrovascular disease ([place]) 07/22/2007  . THYROID NODULE 01/14/2007  . Pulmonary nodule 01/14/2007  . MENINGIOMA 08/27/2006  . Essential hypertension 07/15/2006  . Osteoarthritis 07/15/2006   Past Medical History:  Diagnosis Date  . Abnormal CT of the chest    Stable patchy/nodular and ground-glass opacities in the right upper lobe, While grossly unchanged, low grade adenocarcinoma remains possible.  Marland Kitchen ANEMIA, PERNICIOUS 09/10/2006  . Bradycardia    August, 2013  . CORONARY ARTERY DISEASE 11/07/2008   a. NSTEMI 2001 & STEMI 2008 managed medically. b. Takotsubo 06/2006. c. Cath 2009 -> med rx.  . CVA WITH LEFT HEMIPARESIS 07/22/2007   May, 2009, neurology decided aspirin and Plavix at that time.  . DERMATITIS 03/02/2007  . DJD (degenerative joint disease)    L shoulder impingement  . Ejection fraction    EF 60%, echo, July, 2010, (  EF improved after tach a suitable event 2008)  . Family history of anesthesia complication    daughter has a hard time waking up  . H/O hiatal hernia   . HYPERTENSION 07/15/2006  . IBS (irritable bowel  syndrome)   . MENINGIOMA 08/27/2006   Occipital meningioma, surgery, [facility], 2008 /  MRI, [facility], June, 2011, no change in lesion, history believe the patient had a gamma knife treatment of a right occipital atypical meningioma  . OSTEOARTHRITIS 07/15/2006  . Pelvic fracture ([place])   . PULMONARY NODULE 01/14/2007   [place], December, 2011  . Stroke ([place])   . Subarachnoid hemorrhage following injury ([place]) 2007   Subarachnoid hemorrhage secondary to a fall December, 2007  . Takotsubo syndrome 04/22/2008   MI, May, 2008, question takotsubo event  . THYROID NODULE 01/14/2007    Family History  Problem Relation Age of Onset  . Heart attack Brother 14  . Stroke Brother   . Diabetes Brother   . Stroke Sister     Past Surgical History:  Procedure Laterality Date  .  ABDOMINAL HYSTERECTOMY    . BRAIN TUMOR EXCISION     Meningioma; [facility] at [facility] in [facility]  . CHOLECYSTECTOMY    . JOINT REPLACEMENT     rt THR  . SHOULDER SURGERY     Social History   Occupational History    Employer: RETIRED  Tobacco Use  . Smoking status: Never Smoker  . Smokeless tobacco: Never Used  Substance and Sexual Activity  . Alcohol use: No    Alcohol/week: 0.0 standard drinks  . Drug use: No  . Sexual activity: Never    Birth control/protection: Surgical

## 2018-02-05 ENCOUNTER — Telehealth: Payer: Self-pay | Admitting: Internal Medicine

## 2018-02-05 ENCOUNTER — Ambulatory Visit (INDEPENDENT_AMBULATORY_CARE_PROVIDER_SITE_OTHER): Payer: Medicare Other | Admitting: Specialist

## 2018-02-05 NOTE — Telephone Encounter (Signed)
Yes

## 2018-02-05 NOTE — Telephone Encounter (Signed)
Ok for HH as requested.

## 2018-02-05 NOTE — Telephone Encounter (Signed)
Verbal orders  

## 2018-02-05 NOTE — Telephone Encounter (Signed)
Copied from [place] [id number]. Topic: Quick Communication - Home Health Verbal Orders >> Feb 05, 2018 10:59 AM Rayann Heman wrote: Caller/Agency:beth/[facility]  Callback Number: [phone] Requesting palliative care  Frequency: NA

## 2018-02-05 NOTE — Telephone Encounter (Signed)
Verbal orders given  

## 2018-02-06 ENCOUNTER — Ambulatory Visit: Payer: Self-pay

## 2018-02-06 ENCOUNTER — Emergency Department (HOSPITAL_BASED_OUTPATIENT_CLINIC_OR_DEPARTMENT_OTHER): Payer: Medicare Other

## 2018-02-06 ENCOUNTER — Observation Stay (HOSPITAL_BASED_OUTPATIENT_CLINIC_OR_DEPARTMENT_OTHER)
Admission: EM | Admit: 2018-02-06 | Discharge: 2018-02-08 | Disposition: A | Discharge status: Home / Self Care | Payer: Medicare Other | Attending: Internal Medicine | Admitting: Internal Medicine

## 2018-02-06 ENCOUNTER — Other Ambulatory Visit: Payer: Self-pay

## 2018-02-06 ENCOUNTER — Encounter (HOSPITAL_BASED_OUTPATIENT_CLINIC_OR_DEPARTMENT_OTHER): Payer: Self-pay

## 2018-02-06 DIAGNOSIS — R079 Chest pain, unspecified: Secondary | ICD-10-CM | POA: Diagnosis present

## 2018-02-06 DIAGNOSIS — I69354 Hemiplegia and hemiparesis following cerebral infarction affecting left non-dominant side: Secondary | ICD-10-CM | POA: Insufficient documentation

## 2018-02-06 DIAGNOSIS — R778 Other specified abnormalities of plasma proteins: Secondary | ICD-10-CM | POA: Diagnosis present

## 2018-02-06 DIAGNOSIS — D649 Anemia, unspecified: Secondary | ICD-10-CM | POA: Insufficient documentation

## 2018-02-06 DIAGNOSIS — E785 Hyperlipidemia, unspecified: Secondary | ICD-10-CM | POA: Diagnosis not present

## 2018-02-06 DIAGNOSIS — I509 Heart failure, unspecified: Secondary | ICD-10-CM | POA: Diagnosis not present

## 2018-02-06 DIAGNOSIS — R05 Cough: Secondary | ICD-10-CM | POA: Diagnosis not present

## 2018-02-06 DIAGNOSIS — R7989 Other specified abnormal findings of blood chemistry: Secondary | ICD-10-CM | POA: Diagnosis present

## 2018-02-06 DIAGNOSIS — Z66 Do not resuscitate: Secondary | ICD-10-CM | POA: Diagnosis not present

## 2018-02-06 DIAGNOSIS — M1990 Unspecified osteoarthritis, unspecified site: Secondary | ICD-10-CM | POA: Insufficient documentation

## 2018-02-06 DIAGNOSIS — I110 Hypertensive heart disease with heart failure: Secondary | ICD-10-CM | POA: Diagnosis not present

## 2018-02-06 DIAGNOSIS — Z7902 Long term (current) use of antithrombotics/antiplatelets: Secondary | ICD-10-CM | POA: Insufficient documentation

## 2018-02-06 DIAGNOSIS — E039 Hypothyroidism, unspecified: Secondary | ICD-10-CM | POA: Diagnosis not present

## 2018-02-06 DIAGNOSIS — I5181 Takotsubo syndrome: Secondary | ICD-10-CM | POA: Diagnosis present

## 2018-02-06 DIAGNOSIS — F0390 Unspecified dementia without behavioral disturbance: Secondary | ICD-10-CM | POA: Diagnosis not present

## 2018-02-06 DIAGNOSIS — K589 Irritable bowel syndrome without diarrhea: Secondary | ICD-10-CM | POA: Diagnosis not present

## 2018-02-06 DIAGNOSIS — N390 Urinary tract infection, site not specified: Secondary | ICD-10-CM | POA: Diagnosis not present

## 2018-02-06 DIAGNOSIS — Z882 Allergy status to sulfonamides status: Secondary | ICD-10-CM | POA: Diagnosis not present

## 2018-02-06 DIAGNOSIS — I712 Thoracic aortic aneurysm, without rupture: Secondary | ICD-10-CM | POA: Diagnosis not present

## 2018-02-06 DIAGNOSIS — I2510 Atherosclerotic heart disease of native coronary artery without angina pectoris: Secondary | ICD-10-CM | POA: Diagnosis not present

## 2018-02-06 DIAGNOSIS — I10 Essential (primary) hypertension: Secondary | ICD-10-CM | POA: Diagnosis present

## 2018-02-06 DIAGNOSIS — Z7401 Bed confinement status: Secondary | ICD-10-CM | POA: Insufficient documentation

## 2018-02-06 DIAGNOSIS — E0590 Thyrotoxicosis, unspecified without thyrotoxic crisis or storm: Secondary | ICD-10-CM | POA: Diagnosis present

## 2018-02-06 DIAGNOSIS — Z8673 Personal history of transient ischemic attack (TIA), and cerebral infarction without residual deficits: Secondary | ICD-10-CM

## 2018-02-06 DIAGNOSIS — L8990 Pressure ulcer of unspecified site, unspecified stage: Secondary | ICD-10-CM

## 2018-02-06 DIAGNOSIS — R0789 Other chest pain: Secondary | ICD-10-CM | POA: Diagnosis not present

## 2018-02-06 DIAGNOSIS — I252 Old myocardial infarction: Secondary | ICD-10-CM | POA: Diagnosis not present

## 2018-02-06 DIAGNOSIS — R059 Cough, unspecified: Secondary | ICD-10-CM

## 2018-02-06 DIAGNOSIS — J189 Pneumonia, unspecified organism: Secondary | ICD-10-CM

## 2018-02-06 DIAGNOSIS — R222 Localized swelling, mass and lump, trunk: Secondary | ICD-10-CM | POA: Diagnosis not present

## 2018-02-06 DIAGNOSIS — J90 Pleural effusion, not elsewhere classified: Secondary | ICD-10-CM | POA: Diagnosis not present

## 2018-02-06 DIAGNOSIS — D320 Benign neoplasm of cerebral meninges: Secondary | ICD-10-CM | POA: Diagnosis present

## 2018-02-06 DIAGNOSIS — R29818 Other symptoms and signs involving the nervous system: Secondary | ICD-10-CM | POA: Diagnosis not present

## 2018-02-06 HISTORY — DX: Other specified postprocedural states: Z98.890

## 2018-02-06 LAB — URINALYSIS, ROUTINE W REFLEX MICROSCOPIC
Bilirubin Urine: NEGATIVE
GLUCOSE, UA: NEGATIVE mg/dL
Hgb urine dipstick: NEGATIVE
Ketones, ur: NEGATIVE mg/dL
Leukocytes, UA: NEGATIVE
Nitrite: NEGATIVE
Protein, ur: 30 mg/dL — AB
Specific Gravity, Urine: 1.01 (ref 1.005–1.030)
pH: 6.5 (ref 5.0–8.0)

## 2018-02-06 LAB — COMPREHENSIVE METABOLIC PANEL
ALT: 9 U/L (ref 0–44)
AST: 20 U/L (ref 15–41)
Albumin: 3.4 g/dL — ABNORMAL LOW (ref 3.5–5.0)
Alkaline Phosphatase: 80 U/L (ref 38–126)
Anion gap: 7 (ref 5–15)
BILIRUBIN TOTAL: 0.4 mg/dL (ref 0.3–1.2)
BUN: 14 mg/dL (ref 8–23)
CO2: 26 mmol/L (ref 22–32)
Calcium: 8.7 mg/dL — ABNORMAL LOW (ref 8.9–10.3)
Chloride: 101 mmol/L (ref 98–111)
Creatinine, Ser: 0.54 mg/dL (ref 0.44–1.00)
GFR calc Af Amer: >60 mL/min (ref >60)
GFR calc non Af Amer: >60 mL/min (ref >60)
Glucose, Bld: 133 mg/dL — ABNORMAL HIGH (ref 70–99)
Potassium: 3.7 mmol/L (ref 3.5–5.1)
Sodium: 134 mmol/L — ABNORMAL LOW (ref 135–145)
Total Protein: 6.3 g/dL — ABNORMAL LOW (ref 6.5–8.1)

## 2018-02-06 LAB — CBC
HCT: 35.2 % — ABNORMAL LOW (ref 36.0–46.0)
Hemoglobin: 11 g/dL — ABNORMAL LOW (ref 12.0–15.0)
MCH: 29.4 pg (ref 26.0–34.0)
MCHC: 31.3 g/dL (ref 30.0–36.0)
MCV: 94.1 fL (ref 80.0–100.0)
Platelets: 257 10*3/uL (ref 150–400)
RBC: 3.74 MIL/uL — ABNORMAL LOW (ref 3.87–5.11)
RDW: 14.1 % (ref 11.5–15.5)
WBC: 11.4 10*3/uL — ABNORMAL HIGH (ref 4.0–10.5)
nRBC: 0 % (ref 0.0–0.2)

## 2018-02-06 LAB — TROPONIN I
Troponin I: 0.05 ng/mL (ref <0.03)
Troponin I: 0.06 ng/mL (ref <0.03)

## 2018-02-06 LAB — URINALYSIS, MICROSCOPIC (REFLEX)

## 2018-02-06 LAB — I-STAT CG4 LACTIC ACID, ED: Lactic Acid, Venous: 1.48 mmol/L (ref 0.5–1.9)

## 2018-02-06 MED ORDER — AZITHROMYCIN 500 MG IV SOLR
INTRAVENOUS | Status: AC
  Filled 2018-02-06: qty 500

## 2018-02-06 MED ORDER — IOPAMIDOL (ISOVUE-370) INJECTION 76%
100.0000 mL | Freq: Once | INTRAVENOUS | Status: AC | PRN
  Administered 2018-02-06: 100 mL via INTRAVENOUS

## 2018-02-06 MED ORDER — SODIUM CHLORIDE 0.9 % IV SOLN
1.0000 g | Freq: Once | INTRAVENOUS | Status: AC
  Administered 2018-02-06: 1 g via INTRAVENOUS
  Filled 2018-02-06: qty 10

## 2018-02-06 MED ORDER — ACETAMINOPHEN 500 MG PO TABS
1000.0000 mg | ORAL_TABLET | Freq: Once | ORAL | Status: AC
  Administered 2018-02-06: 1000 mg via ORAL
  Filled 2018-02-06: qty 2

## 2018-02-06 MED ORDER — OSELTAMIVIR PHOSPHATE 75 MG PO CAPS
75.0000 mg | ORAL_CAPSULE | Freq: Once | ORAL | Status: AC
  Administered 2018-02-06: 75 mg via ORAL
  Filled 2018-02-06: qty 1

## 2018-02-06 MED ORDER — SODIUM CHLORIDE 0.9 % IV BOLUS
500.0000 mL | Freq: Once | INTRAVENOUS | Status: AC
  Administered 2018-02-06: 500 mL via INTRAVENOUS

## 2018-02-06 MED ORDER — SODIUM CHLORIDE 0.9 % IV SOLN
INTRAVENOUS | Status: DC | PRN
  Administered 2018-02-06: 250 mL via INTRAVENOUS

## 2018-02-06 MED ORDER — SODIUM CHLORIDE 0.9 % IV SOLN
500.0000 mg | Freq: Once | INTRAVENOUS | Status: AC
  Administered 2018-02-06: 500 mg via INTRAVENOUS
  Filled 2018-02-06: qty 500

## 2018-02-06 NOTE — ED Notes (Signed)
Spoke with family and updated them on bed status

## 2018-02-06 NOTE — ED Notes (Signed)
RN in room for labs

## 2018-02-06 NOTE — Telephone Encounter (Signed)
Patient has arrived at the ED.

## 2018-02-06 NOTE — Plan of Care (Signed)
93 recent hip FRX Rigors Chest pain Trop 0.05 Hypoxic recurrent PNA on CXR   DNR but still wants aggressive care  Requested CTA to RO PE  Pt prefers [place]  Accept to tele bed after CTA is done  Waiting on results 7:04 PM Shahida Schnackenberg   8:26 PM CTA reportedly negative for PE, showing possible nodularity may be developing malignancy will need to have further follow-up no evidence of pneumonia.  Urine has many bacteria but no leukocytosis.  Order urine culture.  Continues to have elevated troponin.  Admit to [facility]. Observation

## 2018-02-06 NOTE — ED Notes (Signed)
ED Provider at bedside. 

## 2018-02-06 NOTE — ED Provider Notes (Signed)
[facility] Emergency Department Provider Note MRN:  [id number]  Arrival date & time: 02/06/18     Chief Complaint   Chills History of Present Illness   [name] is a 85 y.o. year-old female with a history of CAD, hypertension presenting to the ED with chief complaint of chills.  Healthcare home assistant noticed that patient was experiencing shaking chills this morning.  Patient endorsing general malaise.  Mild cough which is improving.  Endorsing chest discomfort currently.  Multiple sick contacts in the home recently.  I was unable to obtain an accurate HPI, PMH, or ROS due to the patient's dementia.  Review of Systems  A complete 10 system review of systems was obtained and all systems are negative except as noted in the HPI and PMH.   Patient's Health History    Past Medical History:  Diagnosis Date  . Abnormal CT of the chest    Stable patchy/nodular and ground-glass opacities in the right upper lobe, While grossly unchanged, low grade adenocarcinoma remains possible.  Marland Kitchen ANEMIA, PERNICIOUS 09/10/2006  . Bradycardia    August, 2013  . CORONARY ARTERY DISEASE 11/07/2008   a. NSTEMI 2001 & STEMI 2008 managed medically. b. Takotsubo 06/2006. c. Cath 2009 -> med rx.  . CVA WITH LEFT HEMIPARESIS 07/22/2007   May, 2009, neurology decided aspirin and Plavix at that time.  . DERMATITIS 03/02/2007  . DJD (degenerative joint disease)    L shoulder impingement  . Ejection fraction    EF 60%, echo, July, 2010, (  EF improved after tach a suitable event 2008)  . Family history of anesthesia complication    daughter has a hard time waking up  . H/O hiatal hernia   . HYPERTENSION 07/15/2006  . IBS (irritable bowel syndrome)   . MENINGIOMA 08/27/2006   Occipital meningioma, surgery, [facility], 2008 /  MRI, [place], June, 2011, no change in lesion, history believe the patient had a gamma knife treatment of a right occipital atypical meningioma  . OSTEOARTHRITIS  07/15/2006  . Pelvic fracture ([place])   . PULMONARY NODULE 01/14/2007   [facility], December, 2011  . Stroke ([place])   . Subarachnoid hemorrhage following injury ([place]) 2007   Subarachnoid hemorrhage secondary to a fall December, 2007  . Takotsubo syndrome 04/22/2008   MI, May, 2008, question takotsubo event  . THYROID NODULE 01/14/2007    Past Surgical History:  Procedure Laterality Date  . ABDOMINAL HYSTERECTOMY    . BRAIN TUMOR EXCISION     Meningioma; treated at [facility] in 2008  . CHOLECYSTECTOMY    . JOINT REPLACEMENT     rt THR  . SHOULDER SURGERY      Family History  Problem Relation Age of Onset  . Heart attack Brother 65  . Stroke Brother   . Diabetes Brother   . Stroke Sister     Social History   Socioeconomic History  . Marital status: Widowed    Spouse name: Not on file  . Number of children: 4  . Years of education: Not on file  . Highest education level: Not on file  Occupational History    Employer: RETIRED  Social Needs  . Financial resource strain: Not on file  . Food insecurity:    Worry: Not on file    Inability: Not on file  . Transportation needs:    Medical: Not on file    Non-medical: Not on file  Tobacco Use  . Smoking status: Never Smoker  .  Smokeless tobacco: Never Used  Substance and Sexual Activity  . Alcohol use: No    Alcohol/week: 0.0 standard drinks  . Drug use: No  . Sexual activity: Never    Birth control/protection: Surgical  Lifestyle  . Physical activity:    Days per week: Not on file    Minutes per session: Not on file  . Stress: Not on file  Relationships  . Social connections:    Talks on phone: Not on file    Gets together: Not on file    Attends religious service: Not on file    Active member of club or organization: Not on file    Attends meetings of clubs or organizations: Not on file    Relationship status: Not on file  . Intimate partner violence:    Fear of current or ex partner: Not on file    Emotionally  abused: Not on file    Physically abused: Not on file    Forced sexual activity: Not on file  Other Topics Concern  . Not on file  Social History Narrative   Widowed. Lives in [place], [place] with daughter.      Physical Exam  Vital Signs and Nursing Notes reviewed Vitals:   02/06/18 1919 02/06/18 2000  BP:  (!) 125/55  Pulse: (!) 50 82  Resp: (!) 23 (!) 22  Temp:    SpO2: 95% 96%    CONSTITUTIONAL: Chronically ill-appearing, NAD NEURO:  Alert and oriented x 3, no focal deficits EYES:  eyes equal and reactive ENT/NECK:  no LAD, no JVD CARDIO: Regular rate, well-perfused, normal S1 and S2 PULM:  CTAB no wheezing or rhonchi GI/GU:  normal bowel sounds, non-distended, non-tender MSK/SPINE:  No gross deformities, no edema SKIN:  no rash, atraumatic PSYCH:  Appropriate speech and behavior  Diagnostic and Interventional Summary    EKG Interpretation  Date/Time:  Friday February 06 2018 17:31:37 EST Ventricular Rate:  99 PR Interval:    QRS Duration: 91 QT Interval:  412 QTC Calculation: 493 R Axis:   -22 Text Interpretation:  Atrial fibrillation Borderline left axis deviation Abnrm T, consider ischemia, anterolateral lds Confirmed by Gerlene Fee [phone]) on 02/06/2018 8:21:49 PM      Labs Reviewed  CBC - Abnormal; Notable for the following components:      Result Value   WBC 11.4 (*)    RBC 3.74 (*)    Hemoglobin 11.0 (*)    HCT 35.2 (*)    All other components within normal limits  COMPREHENSIVE METABOLIC PANEL - Abnormal; Notable for the following components:   Sodium 134 (*)    Glucose, Bld 133 (*)    Calcium 8.7 (*)    Total Protein 6.3 (*)    Albumin 3.4 (*)    All other components within normal limits  TROPONIN I - Abnormal; Notable for the following components:   Troponin I 0.05 (*)    All other components within normal limits  URINALYSIS, ROUTINE W REFLEX MICROSCOPIC - Abnormal; Notable for the following components:   Protein, ur 30 (*)    All other  components within normal limits  TROPONIN I - Abnormal; Notable for the following components:   Troponin I 0.06 (*)    All other components within normal limits  URINALYSIS, MICROSCOPIC (REFLEX) - Abnormal; Notable for the following components:   Bacteria, UA MANY (*)    All other components within normal limits  URINE CULTURE  I-STAT CG4 LACTIC ACID, ED  CT ANGIO CHEST PE W OR WO CONTRAST  Final Result    CT Head Wo Contrast  Final Result    DG Chest 2 View  Final Result      Medications  azithromycin (ZITHROMAX) 500 mg in sodium chloride 0.9 % 250 mL IVPB (500 mg Intravenous New Bag/Given 02/06/18 2022)  azithromycin (ZITHROMAX) 500 MG injection (has no administration in time range)  0.9 %  sodium chloride infusion (250 mLs Intravenous New Bag/Given 02/06/18 1920)  sodium chloride 0.9 % bolus 500 mL (0 mLs Intravenous Stopped 02/06/18 1801)  acetaminophen (TYLENOL) tablet 1,000 mg (1,000 mg Oral Given 02/06/18 1630)  oseltamivir (TAMIFLU) capsule 75 mg (75 mg Oral Given 02/06/18 1631)  cefTRIAXone (ROCEPHIN) 1 g in sodium chloride 0.9 % 100 mL IVPB (0 g Intravenous Stopped 02/06/18 2019)  iopamidol (ISOVUE-370) 76 % injection 100 mL (100 mLs Intravenous Contrast Given 02/06/18 1940)     Procedures Critical Care  ED Course and Medical Decision Making  I have reviewed the triage vital signs and the nursing notes.  Pertinent labs & imaging results that were available during my care of the patient were reviewed by me and considered in my medical decision making (see below for details).  Concern for flu versus rigors related to bacterial infection, patient is hemodynamically stable, work-up pending.  Also endorsing chest pain currently, difficult to obtain details, will evaluate with troponin.  Troponin weakly elevated at 0.05, favored type II in relation to infection.  Chest x-ray revealing concern for pneumonia.  Provided with ceftriaxone, azithromycin.  At the request of hospitalist  service, CTPA performed to exclude PE which was negative.  Admitted to hospital service at [facility] for further care and evaluation.  Barth Kirks. Sedonia Small, MD [place] [email or web address]  Final Clinical Impressions(s) / ED Diagnoses     ICD-10-CM   1. Community acquired pneumonia, unspecified laterality J18.9   2. Cough R05 DG Chest 2 View    DG Chest 2 View  3. Elevated troponin R79.89   4. Chest pain, unspecified type R07.9     ED Discharge Orders    None         Maudie Flakes, MD 02/06/18 2039

## 2018-02-06 NOTE — ED Notes (Signed)
Date and time results received: 02/06/18 2001 (use smartphrase ".now" to insert current time)  Test: troponin Critical Value: 0.06  Name of Provider Notified: Dr. Sedonia Small  Orders Received? Or Actions Taken?: No new orders

## 2018-02-06 NOTE — Telephone Encounter (Signed)
Returned call to patient daughter, Warnell Bureau, who states that her mother suddenly became sick today. Vomiting, achyness, and runny nose.  She states that her mother has just finished treatment for pneumonia.  Temperature is 98.6. She has just been hospitalized for a fall and fracture of a hip replacement.  She was seen today by home health who recommended that the pt get some treatment for her symptoms. Per protocol I contacted [facility]. It was agreed that pt should go to the ER for treatment. Care advice read to patient daughter Ms Warnell Bureau. Ms Alfonso Ramus verbalized understanding of all instructions.  Reason for Disposition . Patient sounds very sick or weak to the triager  Answer Assessment - Initial Assessment Questions 1. ONSET: "When did the nasal discharge start?"      runny 2. AMOUNT: "How much discharge is there?"      A lot 3. COUGH: "Do you have a cough?" If yes, ask: "Describe the color of your sputum" (clear, white, yellow, green)     Yes clear 4. RESPIRATORY DISTRESS: "Describe your breathing."      5. FEVER: "Do you have a fever?" If so, ask: "What is your temperature, how was it measured, and when did it start?"    98.6 6. SEVERITY: "Overall, how bad are you feeling right now?" (e.g., doesn't interfere with normal activities, staying home from school/work, staying in bed)      Feels bad vomiting has kept some fluids down 7. OTHER SYMPTOMS: "Do you have any other symptoms?" (e.g., sore throat, earache, wheezing, vomiting)     Unsure about throat vomiting is present. 8. PREGNANCY: "Is there any chance you are pregnant?" "When was your last menstrual period?"     N/A  Protocols used: COMMON COLD-A-AH

## 2018-02-06 NOTE — ED Triage Notes (Signed)
Pt advised to come to ED for evaluation for the flu by home health, vomited once and her body temp is increased to 98.6, daughter gave tylenol at noon

## 2018-02-06 NOTE — ED Notes (Signed)
Patient transported to CT 

## 2018-02-07 ENCOUNTER — Encounter (HOSPITAL_COMMUNITY): Payer: Self-pay

## 2018-02-07 DIAGNOSIS — Z7401 Bed confinement status: Secondary | ICD-10-CM | POA: Diagnosis not present

## 2018-02-07 DIAGNOSIS — R079 Chest pain, unspecified: Secondary | ICD-10-CM

## 2018-02-07 DIAGNOSIS — I110 Hypertensive heart disease with heart failure: Secondary | ICD-10-CM | POA: Diagnosis not present

## 2018-02-07 DIAGNOSIS — I2510 Atherosclerotic heart disease of native coronary artery without angina pectoris: Secondary | ICD-10-CM | POA: Diagnosis not present

## 2018-02-07 DIAGNOSIS — E039 Hypothyroidism, unspecified: Secondary | ICD-10-CM | POA: Diagnosis not present

## 2018-02-07 DIAGNOSIS — K589 Irritable bowel syndrome without diarrhea: Secondary | ICD-10-CM | POA: Diagnosis not present

## 2018-02-07 DIAGNOSIS — D649 Anemia, unspecified: Secondary | ICD-10-CM | POA: Diagnosis not present

## 2018-02-07 DIAGNOSIS — J189 Pneumonia, unspecified organism: Secondary | ICD-10-CM | POA: Diagnosis not present

## 2018-02-07 DIAGNOSIS — R7989 Other specified abnormal findings of blood chemistry: Secondary | ICD-10-CM | POA: Diagnosis not present

## 2018-02-07 DIAGNOSIS — I509 Heart failure, unspecified: Secondary | ICD-10-CM | POA: Diagnosis not present

## 2018-02-07 DIAGNOSIS — E785 Hyperlipidemia, unspecified: Secondary | ICD-10-CM | POA: Diagnosis not present

## 2018-02-07 DIAGNOSIS — Z7902 Long term (current) use of antithrombotics/antiplatelets: Secondary | ICD-10-CM | POA: Diagnosis not present

## 2018-02-07 DIAGNOSIS — L8990 Pressure ulcer of unspecified site, unspecified stage: Secondary | ICD-10-CM

## 2018-02-07 DIAGNOSIS — R222 Localized swelling, mass and lump, trunk: Secondary | ICD-10-CM | POA: Diagnosis not present

## 2018-02-07 DIAGNOSIS — R072 Precordial pain: Secondary | ICD-10-CM | POA: Diagnosis not present

## 2018-02-07 DIAGNOSIS — R05 Cough: Secondary | ICD-10-CM | POA: Diagnosis present

## 2018-02-07 DIAGNOSIS — Z66 Do not resuscitate: Secondary | ICD-10-CM | POA: Diagnosis not present

## 2018-02-07 DIAGNOSIS — M1990 Unspecified osteoarthritis, unspecified site: Secondary | ICD-10-CM | POA: Diagnosis not present

## 2018-02-07 DIAGNOSIS — F0390 Unspecified dementia without behavioral disturbance: Secondary | ICD-10-CM | POA: Diagnosis not present

## 2018-02-07 DIAGNOSIS — Z882 Allergy status to sulfonamides status: Secondary | ICD-10-CM | POA: Diagnosis not present

## 2018-02-07 DIAGNOSIS — I252 Old myocardial infarction: Secondary | ICD-10-CM | POA: Diagnosis not present

## 2018-02-07 DIAGNOSIS — I69354 Hemiplegia and hemiparesis following cerebral infarction affecting left non-dominant side: Secondary | ICD-10-CM | POA: Diagnosis not present

## 2018-02-07 DIAGNOSIS — N390 Urinary tract infection, site not specified: Secondary | ICD-10-CM | POA: Diagnosis not present

## 2018-02-07 LAB — HEPATIC FUNCTION PANEL
ALT: 9 U/L (ref 0–44)
AST: 18 U/L (ref 15–41)
Albumin: 3 g/dL — ABNORMAL LOW (ref 3.5–5.0)
Alkaline Phosphatase: 70 U/L (ref 38–126)
Bilirubin, Direct: <0.1 mg/dL (ref 0.0–0.2)
Total Bilirubin: 0.2 mg/dL — ABNORMAL LOW (ref 0.3–1.2)
Total Protein: 6.1 g/dL — ABNORMAL LOW (ref 6.5–8.1)

## 2018-02-07 LAB — BASIC METABOLIC PANEL
Anion gap: 8 (ref 5–15)
BUN: 9 mg/dL (ref 8–23)
CO2: 27 mmol/L (ref 22–32)
Calcium: 8.7 mg/dL — ABNORMAL LOW (ref 8.9–10.3)
Chloride: 102 mmol/L (ref 98–111)
Creatinine, Ser: 0.75 mg/dL (ref 0.44–1.00)
GFR calc Af Amer: >60 mL/min (ref >60)
GFR calc non Af Amer: >60 mL/min (ref >60)
Glucose, Bld: 90 mg/dL (ref 70–99)
Potassium: 3.4 mmol/L — ABNORMAL LOW (ref 3.5–5.1)
Sodium: 137 mmol/L (ref 135–145)

## 2018-02-07 LAB — CBC WITH DIFFERENTIAL/PLATELET
ABS IMMATURE GRANULOCYTES: 0.04 10*3/uL (ref 0.00–0.07)
Basophils Absolute: 0.1 10*3/uL (ref 0.0–0.1)
Basophils Relative: 1 %
Eosinophils Absolute: 0.2 10*3/uL (ref 0.0–0.5)
Eosinophils Relative: 2 %
HCT: 35 % — ABNORMAL LOW (ref 36.0–46.0)
HEMOGLOBIN: 10.7 g/dL — AB (ref 12.0–15.0)
Immature Granulocytes: 1 %
Lymphocytes Relative: 23 %
Lymphs Abs: 1.7 10*3/uL (ref 0.7–4.0)
MCH: 28.5 pg (ref 26.0–34.0)
MCHC: 30.6 g/dL (ref 30.0–36.0)
MCV: 93.1 fL (ref 80.0–100.0)
Monocytes Absolute: 1.7 10*3/uL — ABNORMAL HIGH (ref 0.1–1.0)
Monocytes Relative: 23 %
NRBC: 0 % (ref 0.0–0.2)
Neutro Abs: 3.9 10*3/uL (ref 1.7–7.7)
Neutrophils Relative %: 50 %
Platelets: 227 10*3/uL (ref 150–400)
RBC: 3.76 MIL/uL — ABNORMAL LOW (ref 3.87–5.11)
RDW: 14.1 % (ref 11.5–15.5)
WBC: 7.6 10*3/uL (ref 4.0–10.5)

## 2018-02-07 LAB — MAGNESIUM: Magnesium: 1.6 mg/dL — ABNORMAL LOW (ref 1.7–2.4)

## 2018-02-07 LAB — BRAIN NATRIURETIC PEPTIDE: B Natriuretic Peptide: 1000.3 pg/mL — ABNORMAL HIGH (ref 0.0–100.0)

## 2018-02-07 LAB — TSH: TSH: 0.81 u[IU]/mL (ref 0.350–4.500)

## 2018-02-07 LAB — PROCALCITONIN: Procalcitonin: 0.2 ng/mL

## 2018-02-07 LAB — INFLUENZA PANEL BY PCR (TYPE A & B)
Influenza A By PCR: NEGATIVE
Influenza B By PCR: NEGATIVE

## 2018-02-07 LAB — TROPONIN I
Troponin I: 0.05 ng/mL (ref <0.03)
Troponin I: 0.04 ng/mL (ref <0.03)

## 2018-02-07 MED ORDER — PRAVASTATIN SODIUM 10 MG PO TABS
20.0000 mg | ORAL_TABLET | Freq: Every evening | ORAL | Status: DC
  Administered 2018-02-07: 20 mg via ORAL
  Filled 2018-02-07: qty 2

## 2018-02-07 MED ORDER — SODIUM CHLORIDE 0.9 % IV SOLN
1.0000 g | INTRAVENOUS | Status: DC
  Administered 2018-02-07: 1 g via INTRAVENOUS
  Filled 2018-02-07 (×2): qty 10

## 2018-02-07 MED ORDER — AZITHROMYCIN 250 MG PO TABS: 500.0000 mg | ORAL_TABLET | Freq: Every day | ORAL | Status: DC

## 2018-02-07 MED ORDER — ACETAMINOPHEN 325 MG PO TABS: 650.0000 mg | ORAL_TABLET | Freq: Four times a day (QID) | ORAL | Status: DC | PRN

## 2018-02-07 MED ORDER — ENOXAPARIN SODIUM 40 MG/0.4ML ~~LOC~~ SOLN
40.0000 mg | SUBCUTANEOUS | Status: DC
  Administered 2018-02-08: 40 mg via SUBCUTANEOUS
  Filled 2018-02-07 (×2): qty 0.4

## 2018-02-07 MED ORDER — SODIUM CHLORIDE 0.9 % IV SOLN
500.0000 mg | INTRAVENOUS | Status: DC
  Filled 2018-02-07: qty 500

## 2018-02-07 MED ORDER — BISACODYL 10 MG RE SUPP: 10.0000 mg | Freq: Every day | RECTAL | Status: DC | PRN

## 2018-02-07 MED ORDER — ONDANSETRON HCL 4 MG PO TABS: 4.0000 mg | ORAL_TABLET | Freq: Four times a day (QID) | ORAL | Status: DC | PRN

## 2018-02-07 MED ORDER — ONDANSETRON HCL 4 MG/2ML IJ SOLN: 4.0000 mg | Freq: Four times a day (QID) | INTRAMUSCULAR | Status: DC | PRN

## 2018-02-07 MED ORDER — MAGNESIUM SULFATE 2 GM/50ML IV SOLN
2.0000 g | Freq: Once | INTRAVENOUS | Status: AC
  Administered 2018-02-07: 2 g via INTRAVENOUS
  Filled 2018-02-07: qty 50

## 2018-02-07 MED ORDER — METHIMAZOLE 5 MG PO TABS: 5.0000 mg | ORAL_TABLET | ORAL | Status: DC

## 2018-02-07 MED ORDER — CLOPIDOGREL BISULFATE 75 MG PO TABS
75.0000 mg | ORAL_TABLET | Freq: Every day | ORAL | Status: DC
  Administered 2018-02-07 – 2018-02-08 (×2): 75 mg via ORAL
  Filled 2018-02-07 (×2): qty 1

## 2018-02-07 MED ORDER — ACETAMINOPHEN 650 MG RE SUPP: 650.0000 mg | Freq: Four times a day (QID) | RECTAL | Status: DC | PRN

## 2018-02-07 MED ORDER — SENNA 8.6 MG PO TABS: 1.0000 | ORAL_TABLET | Freq: Every evening | ORAL | Status: DC | PRN

## 2018-02-07 MED ORDER — NITROGLYCERIN 0.4 MG SL SUBL: 0.4000 mg | SUBLINGUAL_TABLET | SUBLINGUAL | Status: DC | PRN

## 2018-02-07 MED ORDER — ENSURE ENLIVE PO LIQD
237.0000 mL | Freq: Two times a day (BID) | ORAL | Status: DC
  Administered 2018-02-08: 237 mL via ORAL

## 2018-02-07 MED ORDER — CYCLOBENZAPRINE HCL 10 MG PO TABS: 5.0000 mg | ORAL_TABLET | Freq: Every day | ORAL | Status: DC | PRN

## 2018-02-07 MED ORDER — MELATONIN 3 MG PO TABS
1.5000 mg | ORAL_TABLET | Freq: Every evening | ORAL | Status: DC | PRN
  Filled 2018-02-07: qty 0.5

## 2018-02-07 MED ORDER — DOCUSATE SODIUM 100 MG PO CAPS
100.0000 mg | ORAL_CAPSULE | Freq: Two times a day (BID) | ORAL | Status: DC
  Administered 2018-02-07 – 2018-02-08 (×3): 100 mg via ORAL
  Filled 2018-02-07 (×3): qty 1

## 2018-02-07 NOTE — Consult Note (Signed)
Cardiology Consultation:   Patient ID: [name] MRN: [id number]; DOB: [birth year]  Admit date: 02/06/2018 Date of Consult: 02/07/2018  Primary Care Provider: Isaac Bliss, Rayford Halsted, MD Primary Cardiologist: Dr Bettina Gavia   Patient Profile:   [name] is a 85 y.o. female with a hx of Takotsubo CM, hypothyroid, meningioma status post recent gamma knife resection, coronary artery disease, prior CVA who is being seen today for the evaluation of chest pain and elevated troponin at the request of Gean Birchwood MD.  History of Present Illness:   Patient with history of takotsubo CM in 2008.  Patient apparently had a catheterization in 2009 and medical therapy recommended.  Full results not available.  Last echocardiogram May 2019 showed normal LV function, grade 1 diastolic dysfunction, mild aortic insufficiency. Pt had a fall in November resulting in hip fracture treated with medications.  Also recently treated for pneumonia.  Patient has had decreased mobility at home and has been wearing a belt across her chest to assist with mobility.  She states he has had chest pain in the left breast area continuously for 3 days.  She feels it is related to where the belt was.  Does not radiate and no associated symptoms.  Troponin minimally elevated.  Cardiology asked to evaluate.  Past Medical History:  Diagnosis Date  . Abnormal CT of the chest    Stable patchy/nodular and ground-glass opacities in the right upper lobe, While grossly unchanged, low grade adenocarcinoma remains possible.  [facility] ANEMIA, PERNICIOUS 09/10/2006  . Bradycardia    August, 2013  . CORONARY ARTERY DISEASE 11/07/2008   a. NSTEMI 2001 & STEMI 2008 managed medically. b. Takotsubo 06/2006. c. Cath 2009 -> med rx.  . CVA WITH LEFT HEMIPARESIS 07/22/2007   May, 2009, neurology decided aspirin and Plavix at that time.  . DERMATITIS 03/02/2007  . DJD (degenerative joint disease)    L shoulder impingement  . Ejection fraction    EF 60%, echo, July, 2010, (  EF improved after tach a suitable event 2008)  . Family history of anesthesia complication    daughter has a hard time waking up  . H/O hiatal hernia   . H/O major orthopedic surgery    rotator cuff R  . HYPERTENSION 07/15/2006  . IBS (irritable bowel syndrome)   . MENINGIOMA 08/27/2006   Occipital meningioma, surgery, [facility], 2008 /  MRI, [place], June, 2011, no change in lesion, history believe the patient had a gamma knife treatment of a right occipital atypical meningioma  . Myocardial infarction ([place])   . OSTEOARTHRITIS 07/15/2006  . Pelvic fracture ([place])   . PULMONARY NODULE 01/14/2007   [place], December, 2011  . Stroke ([place])   . Subarachnoid hemorrhage following injury ([place]) 2007   Subarachnoid hemorrhage secondary to a fall December, 2007  . Takotsubo syndrome 04/22/2008   MI, May, 2008, question takotsubo event  . THYROID NODULE 01/14/2007    Past Surgical History:  Procedure Laterality Date  . ABDOMINAL HYSTERECTOMY    . BRAIN TUMOR EXCISION     Meningioma; treated at [facility] in 2008  . CHOLECYSTECTOMY    . JOINT REPLACEMENT     rt THR  . SHOULDER SURGERY       Inpatient Medications: Scheduled Meds: . [START ON 02/08/2018] azithromycin  500 mg Oral q1800  . clopidogrel  75 mg Oral Daily  . docusate sodium  100 mg Oral BID  . enoxaparin (LOVENOX) injection  40 mg Subcutaneous Q24H  . [START ON 02/09/2018]  methimazole  5 mg Oral Q M,W,F  . pravastatin  20 mg Oral QPM   Continuous Infusions: . azithromycin    . cefTRIAXone (ROCEPHIN)  IV     PRN Meds: acetaminophen **OR** acetaminophen, bisacodyl, cyclobenzaprine, Melatonin, nitroGLYCERIN, ondansetron **OR** ondansetron (ZOFRAN) IV, senna  Allergies:    Allergies  Allergen Reactions  . Atorvastatin Other (See Comments)    Raises liver enzymes (takes pravastatin at home)  . Fluorometholone     Falling down  . Influenza Vaccines Other (See Comments)    "dizziness"  . Phenytoin  Other (See Comments)    Blood clots  . Promethazine Hcl Other (See Comments)    Severe sedation  . Sulfamethoxazole     REACTION: unspecified  Per pt daughter she stated this causes rash    Social History:   Social History   Socioeconomic History  . Marital status: Widowed    Spouse name: Not on file  . Number of children: 4  . Years of education: Not on file  . Highest education level: Not on file  Occupational History    Employer: RETIRED  Social Needs  . Financial resource strain: Not on file  . Food insecurity:    Worry: Not on file    Inability: Not on file  . Transportation needs:    Medical: Not on file    Non-medical: Not on file  Tobacco Use  . Smoking status: Never Smoker  . Smokeless tobacco: Never Used  Substance and Sexual Activity  . Alcohol use: No    Alcohol/week: 0.0 standard drinks  . Drug use: No  . Sexual activity: Not Currently    Birth control/protection: Surgical  Lifestyle  . Physical activity:    Days per week: Not on file    Minutes per session: Not on file  . Stress: Not on file  Relationships  . Social connections:    Talks on phone: Patient refused    Gets together: Patient refused    Attends religious service: Patient refused    Active member of club or organization: Patient refused    Attends meetings of clubs or organizations: Patient refused    Relationship status: Patient refused  . Intimate partner violence:    Fear of current or ex partner: Patient refused    Emotionally abused: Patient refused    Physically abused: Patient refused    Forced sexual activity: Patient refused  Other Topics Concern  . Not on file  Social History Narrative   Widowed. Lives in [place], [place] with daughter.     Family History:    Family History  Problem Relation Age of Onset  . Heart attack Brother 17  . Stroke Brother   . Diabetes Brother   . Stroke Sister      ROS:  Please see the history of present illness.  Complains of left  leg pain and left chest pain.  Instability.  Denies fevers, chills, productive cough, hemoptysis. All other ROS reviewed and negative.     Physical Exam/Data:   Vitals:   02/06/18 2200 02/06/18 2330 02/07/18 0054 02/07/18 0752  BP: (!) 115/58  112/63 (!) 154/93  Pulse: 81 74 78 76  Resp: (!) 21 16 (!) 24 11  Temp:    (!) 97.3 F (36.3 C)  TempSrc:    Oral  SpO2: 95% 95% 95% 96%  Weight:      Height:        Intake/Output Summary (Last 24 hours) at 02/07/2018 1017 Last data  filed at 02/07/2018 0331 Gross per 24 hour  Intake 849.8 ml  Output 825 ml  Net 24.8 ml   Filed Weights   02/06/18 1531  Weight: 50.8 kg   Body mass index is 19.22 kg/m.  General:  Well nourished, frail in no acute distress HEENT: normal Lymph: no adenopathy Neck: supple Endocrine:  No thryomegaly Vascular: No carotid bruits; FA pulses 2+ bilaterally without bruits  Cardiac:  normal S1, S2; RRR; 2/6 systolic murmur LSB Lungs:  Mildly diminished BS Abd: soft, nontender, no hepatomegaly  Ext: no edema Musculoskeletal:  No deformities, BUE and BLE strength normal and equal Skin: warm and dry  Neuro:  Moves all ext Psych:  Normal affect   EKG:  The EKG was personally reviewed and demonstrates: Sinus rhythm with PACs.  Anterior lateral T wave changes more prominent compared to previous. Telemetry:  Telemetry was personally reviewed and demonstrates: Sinus with PVCs.   Laboratory Data:  Chemistry Recent Labs  Lab 02/06/18 1650 02/07/18 0634  NA 134* 137  K 3.7 3.4*  CL 101 102  CO2 26 27  GLUCOSE 133* 90  BUN 14 9  CREATININE 0.54 0.75  CALCIUM 8.7* 8.7*  GFRNONAA >60 >60  GFRAA >60 >60  ANIONGAP 7 8    Recent Labs  Lab 02/06/18 1650 02/07/18 0634  PROT 6.3* 6.1*  ALBUMIN 3.4* 3.0*  AST 20 18  ALT 9 9  ALKPHOS 80 70  BILITOT 0.4 0.2*   Hematology Recent Labs  Lab 02/06/18 1650 02/07/18 0634  WBC 11.4* 7.6  RBC 3.74* 3.76*  HGB 11.0* 10.7*  HCT 35.2* 35.0*  MCV 94.1 93.1    MCH 29.4 28.5  MCHC 31.3 30.6  RDW 14.1 14.1  PLT 257 227   Cardiac Enzymes Recent Labs  Lab 02/06/18 1650 02/06/18 1917 02/07/18 0102 02/07/18 0634  TROPONINI 0.05* 0.06* 0.05* 0.04*   BNP Recent Labs  Lab 02/07/18 0634  BNP 1,000.3*   Radiology/Studies:  Dg Chest 2 View  Result Date: 02/06/2018 CLINICAL DATA:  Cough congestion fever EXAM: CHEST - 2 VIEW COMPARISON:  12/05/2017, 06/12/2017, chest CT 01/30/2013 FINDINGS: Small left-sided pleural effusion with lingular and basilar airspace disease. Cardiomegaly with aortic atherosclerosis. Irregular opacity in the right upper lobe, likely corresponding to focal scarring on comparison chest CT from 2014. No pneumothorax. IMPRESSION: 1. Small left-sided pleural effusion with lingular and left basilar atelectasis or pneumonia 2. Cardiomegaly Electronically Signed   By: Donavan Foil M.D.   On: 02/06/2018 17:28   Ct Head Wo Contrast  Result Date: 02/06/2018 CLINICAL DATA:  Focal neuro deficit, > 6 hrs, stroke suspected. 85 year old with sudden onset of vomiting. History of meningioma post resection. EXAM: CT HEAD WITHOUT CONTRAST TECHNIQUE: Contiguous axial images were obtained from the base of the skull through the vertex without intravenous contrast. COMPARISON:  Head CT 06/12/2017, brain MRI 06/14/2017 FINDINGS: Brain: Progressive decreased density in the posterior right parietal lobe, superior to known meningioma, lesion better defined on MRI. Additional area of encephalomalacia in the right parietooccipital lobe adjacent to prior craniotomy is unchanged. No hemorrhage. No midline shift. No subdural collection. Chronic small vessel ischemia is otherwise similar to prior. No hydrocephalus. The basilar cisterns are patent. Vascular: No hyperdense vessel. Skull: Prior right parietooccipital craniotomy with bony resorption at the craniotomy defect related to known meningioma. Overall appearance is not significantly changed from May 2019 CT no  new focal lesion. Sinuses/Orbits: No acute findings. Other: None. IMPRESSION: 1. Increasing right parietal gliosis adjacent to  known meningioma, lesion grossly similar in appearance to May 2019 CT. MRI provided greater detail of lesion evaluation. 2. No hemorrhage or other acute findings. Electronically Signed   By: Keith Rake M.D.   On: 02/06/2018 20:06   Ct Angio Chest Pe W Or Wo Contrast  Result Date: 02/06/2018 CLINICAL DATA:  Sudden onset of vomiting and body aches. History of pulmonary nodule. History of a brain tumor. EXAM: CT ANGIOGRAPHY CHEST WITH CONTRAST TECHNIQUE: Multidetector CT imaging of the chest was performed using the standard protocol during bolus administration of intravenous contrast. Multiplanar CT image reconstructions and MIPs were obtained to evaluate the vascular anatomy. CONTRAST:  182mL ISOVUE-370 IOPAMIDOL (ISOVUE-370) INJECTION 76% COMPARISON:  Chest radiograph, 02/06/2018.  Chest CTA, 01/30/2013. FINDINGS: Cardiovascular: There is satisfactory opacification of the pulmonary arteries to the segmental level. There is no evidence of a pulmonary embolism. Heart is mildly enlarged. Trace pericardial effusion. Two vessel coronary artery calcifications. Ascending aorta dilated to 4.2 cm. Aortic atherosclerosis. No dissection. Mediastinum/Nodes: Mild thyroid enlargement. Poorly defined thyroid nodules, largest in the left lobe measuring 1.8 cm. No neck base or axillary masses or adenopathy. No mediastinal or hilar masses or pathologically enlarged lymph nodes. Trachea is prominent, widely patent. Mild distention of the mid to upper esophagus. Esophagus otherwise unremarkable. Lungs/Pleura: Small left pleural effusion. There is dependent atelectasis in the left lower lobe. In the right upper lobe, there is bandlike opacity that is contiguous with a round opacity with irregular to spiculated margins. This latter round opacity is centered on image 28, series 5, measuring 15 mm. The  contiguous opacity extends to the posterosuperior pleural margins of the right upper lobe and joints mild apical pleuroparenchymal scarring. This opacity has increased when compared to the prior CT, particularly around component. There is minor dependent subsegmental atelectasis in the right lower lobe. Small calcified granuloma in the left upper lobe. 4 mm noncalcified nodule in the left upper lobe, image 38, series 5, stable from the prior exam, benign. Upper Abdomen: No acute finding. Musculoskeletal: No fracture or acute finding. No osteoblastic or osteolytic lesions. Vertebral hemangiomas noted in T6 and T12. Review of the MIP images confirms the above findings. IMPRESSION: 1. No evidence of a pulmonary embolism. 2. Opacity in the right upper lobe, increased compared the prior CT. This has a rounded, nodular component that measures 15 mm. This is suspicious for malignancy developing in the setting of scarring. Recommend follow-up PET-CT or tissue sampling. 3. Small left pleural effusion. Left lower lobe atelectasis. No convincing pneumonia. No pulmonary edema. 4. Cardiomegaly. 5. 4.2 cm ascending thoracic aortic aneurysm. Recommend annual imaging followup by CTA or MRA. This recommendation follows 2010 ACCF/AHA/AATS/ACR/ASA/SCA/SCAI/SIR/STS/SVM Guidelines for the Diagnosis and Management of Patients with Thoracic Aortic Disease. Circulation. 2010; 121: e266-e369 6. Two vessel coronary artery calcifications. 7. Left lobe thyroid nodule which may be increased from the prior CT. Measures 1.8 cm. Consider follow-up ultrasound for further characterization. Aortic aneurysm NOS (ICD10-I71.9). Electronically Signed   By: Lajean Manes M.D.   On: 02/06/2018 20:09    Assessment and Plan:   1 chest pain-symptoms extremely atypical and likely musculoskeletal.  She states pain is in the left breast area where she has recently used a belt to help with mobility.  There was continuous for 3 days.  Troponins are 0.05,  0.06, 0.05 and 0.04.  Not consistent with acute coronary syndrome.  No plans for further ischemia evaluation.  2 minimally elevated troponin-as outlined not consistent with acute coronary syndrome.  3 right lung mass-concerning for malignancy by report.  Further evaluation per primary care.  4 question atrial fibrillation-there was concern patient may have atrial fibrillation on electrocardiogram.  I have personally reviewed this and it shows sinus with PACs.  5 history of nonobstructive coronary disease-continue Plavix and statin.  6 history of meningioma-status post resection  7 question pneumonia-Per primary care.  [facility] will sign off.   Medication Recommendations: Continue preadmission cardiac medications at discharge Other recommendations (labs, testing, etc): No further cardiac testing Follow up as an outpatient: Dr. Bettina Gavia 6 to 8 weeks following discharge  For questions or updates, please contact [place] Please consult [email or web address] for contact info under     Signed, Kirk Ruths, MD  02/07/2018 10:17 AM

## 2018-02-07 NOTE — ED Notes (Signed)
[facility] notified (Kim) - patient ready for transport 

## 2018-02-07 NOTE — ED Notes (Signed)
Sent text page to MD

## 2018-02-07 NOTE — ED Notes (Signed)
[place]

## 2018-02-07 NOTE — Progress Notes (Signed)
Pt refused urinary in and out cath, Bladder scan showed 900. After speaking with family we got her up to bedside commode with 2 assist and walker.  Pt voided 300. There was also urine in bed and some was on floor that wasn't measured.

## 2018-02-07 NOTE — ED Notes (Signed)
Spoke with MD and received verbal orders for Trop I now and q6. No other orders received. Provider also notified that patient is a hold in [facility] ED.

## 2018-02-07 NOTE — Progress Notes (Signed)
[facility] RN  RN received a request that pt's daughter [name] [phone]) would like more information about Hospice services. RN visited pt's bedside with no family available. RN attempted to contact daughter at the available number however unable to leave a message due to voice mailbox was full.   Raina Mina, RN, [facility]) [phone]   Hopsice Liaisons are on AMION

## 2018-02-07 NOTE — Care Management Note (Signed)
Case Management Note  Patient Details  Name: [name] MRN: [id number] Date of Birth: Jan 07, 1923  Subjective/Objective: 85 yo F with CP with elevated troponin and productive cough with recent tx for PNA. Hx of Takotsubo CM, hypothyroid, meningioma, s/p recent gamma knife resection, CAD, disease, prior CVA.                Action/Plan: Referral received to discuss palliative care   Expected Discharge Date:  02/21/18               Expected Discharge Plan:  [place]  In-House Referral:     Discharge planning Services  CM Consult  Post Acute Care Choice:    Choice offered to:     DME Arranged:    DME Agency:     HH Arranged:    [place] Agency:     Status of Service:  Completed, signed off  If discussed at [employer] [facility] [employer], dates discussed:    Additional Comments: Met with pt's daughter [name]). D/C plan is for pt to return home with the support of her family. She is active with [facility] RN, PT and OT. [name] requested information about palliative services. Discussed [place] to provide information and daughter agreed. Contacted Lisa at [facility] [name] cell phone # [phone].  Norina Buzzard, RN 02/07/2018, 2:16 PM

## 2018-02-07 NOTE — ED Notes (Signed)
Report given to Kingslee J, RN. [facility] 

## 2018-02-07 NOTE — Progress Notes (Signed)
CRITICAL VALUE ALERT  Critical Value:  Troponin 0.04  Date & Time Notied:  02/07/18 0914  Provider Notified: C. Florene Glen  Orders Received/Actions taken:

## 2018-02-07 NOTE — H&P (Signed)
History and Physical    [name] WFU:[id number] DOB: Jun 08, 1922 DOA: 02/06/2018  PCP: Isaac Bliss, Rayford Halsted, MD  Patient coming from: Home.  Chief Complaint: Nausea vomiting and chills.  HPI: [name] is a 85 y.o. female with history of hypothyroidism, Takotsubo cardiomyopathy, meningioma status post recent gamma knife surgery complicated by brain edema requiring Decadron off Decadron since normal alignment has had a fall has not had a pelvic fracture in the month of November 2019 was recently treated for pneumonia 2 weeks ago had taken Levaquin for 1 week started developing sudden onset of nausea vomiting 1 episode with chills.  Patient also has been complaining of left-sided chest pain persistent for last 1 week.  Patient's family states that patient has been gradually become bedbound and for mobility has to wear a belt across the chest to move which may be causing the pain.  Family states at times patient gets confused and also has benign left lower extremity weakness for last 2 months since a fall.  Has been followed up by primary care and had x-rays and primary care physician is planning to get cortisone injections for arthritis of the left hip.  ED Course: In the ER CT head was unremarkable.  CT angiogram of the chest shows right upper lobe nodule concerning for possible malignancy.  There also was left sided mild pleural effusion.  Troponin came mildly elevated.  EKG showing possibility of A. fib.  Monitor at this time showing sinus rhythm.  Patient was started on empiric antibiotics for possible pneumonia given that patient was having productive cough for last 2 days.  Patient admitted for further work-up of elevated troponin with chest pain and also enlarging nodule in the right upper lobe.  Family states that patient at times gets confused.  Review of Systems: As per HPI, rest all negative.   Past Medical History:  Diagnosis Date  . Abnormal CT of the chest    Stable  patchy/nodular and ground-glass opacities in the right upper lobe, While grossly unchanged, low grade adenocarcinoma remains possible.  [facility] Kitchen ANEMIA, PERNICIOUS 09/10/2006  . Bradycardia    August, 2013  . CORONARY ARTERY DISEASE 11/07/2008   a. NSTEMI 2001 & STEMI 2008 managed medically. b. Takotsubo 06/2006. c. Cath 2009 -> med rx.  . CVA WITH LEFT HEMIPARESIS 07/22/2007   May, 2009, neurology decided aspirin and Plavix at that time.  . DERMATITIS 03/02/2007  . DJD (degenerative joint disease)    L shoulder impingement  . Ejection fraction    EF 60%, echo, July, 2010, (  EF improved after tach a suitable event 2008)  . Family history of anesthesia complication    daughter has a hard time waking up  . H/O hiatal hernia   . H/O major orthopedic surgery    rotator cuff R  . HYPERTENSION 07/15/2006  . IBS (irritable bowel syndrome)   . MENINGIOMA 08/27/2006   Occipital meningioma, surgery, [facility], 2008 /  MRI, [place], June, 2011, no change in lesion, history believe the patient had a gamma knife treatment of a right occipital atypical meningioma  . OSTEOARTHRITIS 07/15/2006  . Pelvic fracture ([place])   . PULMONARY NODULE 01/14/2007   [place], December, 2011  . Stroke ([place])   . Subarachnoid hemorrhage following injury ([place]) 2007   Subarachnoid hemorrhage secondary to a fall December, 2007  . Takotsubo syndrome 04/22/2008   MI, May, 2008, question takotsubo event  . THYROID NODULE 01/14/2007    Past Surgical History:  Procedure  Laterality Date  . ABDOMINAL HYSTERECTOMY    . BRAIN TUMOR EXCISION     Meningioma; [facility] at [facility] in 2008  . CHOLECYSTECTOMY    . JOINT REPLACEMENT     rt THR  . SHOULDER SURGERY       reports that she has never smoked. She has never used smokeless tobacco. She reports that she does not drink alcohol or use drugs.  Allergies  Allergen Reactions  . Atorvastatin Other (See Comments)    Raises liver enzymes (takes pravastatin at home)  . Fluorometholone      Falling down  . Influenza Vaccines Other (See Comments)    "dizziness"  . Phenytoin Other (See Comments)    Blood clots  . Promethazine Hcl Other (See Comments)    Severe sedation  . Sulfamethoxazole     REACTION: unspecified  Per pt daughter she stated this causes rash    Family History  Problem Relation Age of Onset  . Heart attack Brother 37  . Stroke Brother   . Diabetes Brother   . Stroke Sister     Prior to Admission medications   Medication Sig Start Date End Date Taking? Authorizing Provider  acetaminophen (TYLENOL) 325 MG tablet Take 2 tablets (650 mg total) by mouth every 6 (six) hours. 12/16/17   Geradine Girt, DO  bisacodyl (DULCOLAX) 10 MG suppository Place 1 suppository (10 mg total) rectally daily as needed for moderate constipation. 12/16/17   Geradine Girt, DO  clopidogrel (PLAVIX) 75 MG tablet Take 1 tablet (75 mg total) by mouth daily with breakfast. 01/22/18   Hennie Duos, MD  cyclobenzaprine (FLEXERIL) 5 MG tablet Take 1 tablet (5 mg total) by mouth daily as needed for muscle spasms. take 1/2 tab ( 2.5 mg) po daily after PT ( not ST or OT). 01/22/18   Hennie Duos, MD  docusate sodium (COLACE) 100 MG capsule Take 1 capsule (100 mg total) by mouth 2 (two) times daily. 01/22/18   Hennie Duos, MD  Melatonin 1 MG TABS Take 1 tablet (1 mg total) by mouth at bedtime as needed (for sleep). 01/22/18   Hennie Duos, MD  methimazole (TAPAZOLE) 5 MG tablet TAKE 1 TABLET (5 MG TOTALLY) THREE TIMES A WEEK, MON, WED, AND FRI 01/22/18   Hennie Duos, MD  nitroGLYCERIN (NITROSTAT) 0.4 MG SL tablet Place 1 tablet (0.4 mg total) under the tongue every 5 (five) minutes as needed. For chest pain. 01/22/18   Hennie Duos, MD  pravastatin (PRAVACHOL) 20 MG tablet Take 1 tablet (20 mg total) by mouth every evening. 01/22/18   Hennie Duos, MD  senna (SENOKOT) 8.6 MG TABS tablet Take 1 tablet (8.6 mg total) by mouth at bedtime as needed for mild  constipation. 01/22/18   Hennie Duos, MD  Vitamin D, Ergocalciferol, (DRISDOL) 1.25 MG (50000 UT) CAPS capsule Take 1 capsule (50,000 Units total) by mouth every 7 (seven) days. 01/22/18   Hennie Duos, MD    Physical Exam: Vitals:   02/06/18 2115 02/06/18 2200 02/06/18 2330 02/07/18 0054  BP:  (!) 115/58  112/63  Pulse: 85 81 74 78  Resp:  (!) 21 16 (!) 24  Temp:      TempSrc:      SpO2: 97% 95% 95% 95%  Weight:      Height:          Constitutional: Moderately built and nourished. Vitals:   02/06/18 2115 02/06/18 2200 02/06/18  2330 02/07/18 0054  BP:  (!) 115/58  112/63  Pulse: 85 81 74 78  Resp:  (!) 21 16 (!) 24  Temp:      TempSrc:      SpO2: 97% 95% 95% 95%  Weight:      Height:       Eyes: Anicteric no pallor. ENMT: No discharge from the ears eyes nose or mouth. Neck: No mass felt.  No neck rigidity. Respiratory: No rhonchi or crepitations. Cardiovascular: S1-S2 heard. Abdomen: Soft nontender bowel sounds present. Musculoskeletal: No edema.  No joint effusion.  Has been movement difficulties in the left lower extremity. Skin: No rash. Neurologic: Alert awake oriented to her name and place.  Has some difficulty moving left lower extremity with a strength of 3 x 5 which patient's daughter states has been going on for last 2 months. Psychiatric: Oriented to her name and place.   Labs on Admission: I have personally reviewed following labs and imaging studies  CBC: Recent Labs  Lab 02/06/18 1650  WBC 11.4*  HGB 11.0*  HCT 35.2*  MCV 94.1  PLT 841   Basic Metabolic Panel: Recent Labs  Lab 02/06/18 1650  NA 134*  K 3.7  CL 101  CO2 26  GLUCOSE 133*  BUN 14  CREATININE 0.54  CALCIUM 8.7*   GFR: Estimated Creatinine Clearance: 35.2 mL/min (by C-G formula based on SCr of 0.54 mg/dL). Liver Function Tests: Recent Labs  Lab 02/06/18 1650  AST 20  ALT 9  ALKPHOS 80  BILITOT 0.4  PROT 6.3*  ALBUMIN 3.4*   No results for input(s):  LIPASE, AMYLASE in the last 168 hours. No results for input(s): AMMONIA in the last 168 hours. Coagulation Profile: No results for input(s): INR, PROTIME in the last 168 hours. Cardiac Enzymes: Recent Labs  Lab 02/06/18 1650 02/06/18 1917 02/07/18 0102  TROPONINI 0.05* 0.06* 0.05*   BNP (last 3 results) Recent Labs    01/19/18 1543  PROBNP 1,505.0*   HbA1C: No results for input(s): HGBA1C in the last 72 hours. CBG: No results for input(s): GLUCAP in the last 168 hours. Lipid Profile: No results for input(s): CHOL, HDL, LDLCALC, TRIG, CHOLHDL, LDLDIRECT in the last 72 hours. Thyroid Function Tests: No results for input(s): TSH, T4TOTAL, FREET4, T3FREE, THYROIDAB in the last 72 hours. Anemia Panel: No results for input(s): VITAMINB12, FOLATE, FERRITIN, TIBC, IRON, RETICCTPCT in the last 72 hours. Urine analysis:    Component Value Date/Time   COLORURINE YELLOW 02/06/2018 1856   APPEARANCEUR CLEAR 02/06/2018 1856   LABSPEC 1.010 02/06/2018 1856   PHURINE 6.5 02/06/2018 1856   GLUCOSEU NEGATIVE 02/06/2018 1856   HGBUR NEGATIVE 02/06/2018 1856   HGBUR negative 09/20/2009 0959   BILIRUBINUR NEGATIVE 02/06/2018 1856   BILIRUBINUR Negative 12/09/2017 1711   KETONESUR NEGATIVE 02/06/2018 1856   PROTEINUR 30 (A) 02/06/2018 1856   UROBILINOGEN 0.2 12/09/2017 1711   UROBILINOGEN 0.2 10/21/2014 0009   NITRITE NEGATIVE 02/06/2018 1856   LEUKOCYTESUR NEGATIVE 02/06/2018 1856   Sepsis Labs: @LABRCNTIP (procalcitonin:4,lacticidven:4) )No results found for this or any previous visit (from the past 240 hour(s)).   Radiological Exams on Admission: Dg Chest 2 View  Result Date: 02/06/2018 CLINICAL DATA:  Cough congestion fever EXAM: CHEST - 2 VIEW COMPARISON:  12/05/2017, 06/12/2017, chest CT 01/30/2013 FINDINGS: Small left-sided pleural effusion with lingular and basilar airspace disease. Cardiomegaly with aortic atherosclerosis. Irregular opacity in the right upper lobe, likely  corresponding to focal scarring on comparison chest CT from 2014.  No pneumothorax. IMPRESSION: 1. Small left-sided pleural effusion with lingular and left basilar atelectasis or pneumonia 2. Cardiomegaly Electronically Signed   By: Donavan Foil M.D.   On: 02/06/2018 17:28   Ct Head Wo Contrast  Result Date: 02/06/2018 CLINICAL DATA:  Focal neuro deficit, > 6 hrs, stroke suspected. 85 year old with sudden onset of vomiting. History of meningioma post resection. EXAM: CT HEAD WITHOUT CONTRAST TECHNIQUE: Contiguous axial images were obtained from the base of the skull through the vertex without intravenous contrast. COMPARISON:  Head CT 06/12/2017, brain MRI 06/14/2017 FINDINGS: Brain: Progressive decreased density in the posterior right parietal lobe, superior to known meningioma, lesion better defined on MRI. Additional area of encephalomalacia in the right parietooccipital lobe adjacent to prior craniotomy is unchanged. No hemorrhage. No midline shift. No subdural collection. Chronic small vessel ischemia is otherwise similar to prior. No hydrocephalus. The basilar cisterns are patent. Vascular: No hyperdense vessel. Skull: Prior right parietooccipital craniotomy with bony resorption at the craniotomy defect related to known meningioma. Overall appearance is not significantly changed from May 2019 CT no new focal lesion. Sinuses/Orbits: No acute findings. Other: None. IMPRESSION: 1. Increasing right parietal gliosis adjacent to known meningioma, lesion grossly similar in appearance to May 2019 CT. MRI provided greater detail of lesion evaluation. 2. No hemorrhage or other acute findings. Electronically Signed   By: Keith Rake M.D.   On: 02/06/2018 20:06   Ct Angio Chest Pe W Or Wo Contrast  Result Date: 02/06/2018 CLINICAL DATA:  Sudden onset of vomiting and body aches. History of pulmonary nodule. History of a brain tumor. EXAM: CT ANGIOGRAPHY CHEST WITH CONTRAST TECHNIQUE: Multidetector CT imaging of  the chest was performed using the standard protocol during bolus administration of intravenous contrast. Multiplanar CT image reconstructions and MIPs were obtained to evaluate the vascular anatomy. CONTRAST:  112mL ISOVUE-370 IOPAMIDOL (ISOVUE-370) INJECTION 76% COMPARISON:  Chest radiograph, 02/06/2018.  Chest CTA, 01/30/2013. FINDINGS: Cardiovascular: There is satisfactory opacification of the pulmonary arteries to the segmental level. There is no evidence of a pulmonary embolism. Heart is mildly enlarged. Trace pericardial effusion. Two vessel coronary artery calcifications. Ascending aorta dilated to 4.2 cm. Aortic atherosclerosis. No dissection. Mediastinum/Nodes: Mild thyroid enlargement. Poorly defined thyroid nodules, largest in the left lobe measuring 1.8 cm. No neck base or axillary masses or adenopathy. No mediastinal or hilar masses or pathologically enlarged lymph nodes. Trachea is prominent, widely patent. Mild distention of the mid to upper esophagus. Esophagus otherwise unremarkable. Lungs/Pleura: Small left pleural effusion. There is dependent atelectasis in the left lower lobe. In the right upper lobe, there is bandlike opacity that is contiguous with a round opacity with irregular to spiculated margins. This latter round opacity is centered on image 28, series 5, measuring 15 mm. The contiguous opacity extends to the posterosuperior pleural margins of the right upper lobe and joints mild apical pleuroparenchymal scarring. This opacity has increased when compared to the prior CT, particularly around component. There is minor dependent subsegmental atelectasis in the right lower lobe. Small calcified granuloma in the left upper lobe. 4 mm noncalcified nodule in the left upper lobe, image 38, series 5, stable from the prior exam, benign. Upper Abdomen: No acute finding. Musculoskeletal: No fracture or acute finding. No osteoblastic or osteolytic lesions. Vertebral hemangiomas noted in T6 and T12.  Review of the MIP images confirms the above findings. IMPRESSION: 1. No evidence of a pulmonary embolism. 2. Opacity in the right upper lobe, increased compared the prior CT. This has a  rounded, nodular component that measures 15 mm. This is suspicious for malignancy developing in the setting of scarring. Recommend follow-up PET-CT or tissue sampling. 3. Small left pleural effusion. Left lower lobe atelectasis. No convincing pneumonia. No pulmonary edema. 4. Cardiomegaly. 5. 4.2 cm ascending thoracic aortic aneurysm. Recommend annual imaging followup by CTA or MRA. This recommendation follows 2010 ACCF/AHA/AATS/ACR/ASA/SCA/SCAI/SIR/STS/SVM Guidelines for the Diagnosis and Management of Patients with Thoracic Aortic Disease. Circulation. 2010; 121: e266-e369 6. Two vessel coronary artery calcifications. 7. Left lobe thyroid nodule which may be increased from the prior CT. Measures 1.8 cm. Consider follow-up ultrasound for further characterization. Aortic aneurysm NOS (ICD10-I71.9). Electronically Signed   By: Lajean Manes M.D.   On: 02/06/2018 20:09    EKG: Independently reviewed.  Possible A. fib.  Assessment/Plan Principal Problem:   Elevated troponin Active Problems:   Essential hypertension   Hyperlipidemia   MENINGIOMA   Takotsubo syndrome   Hyperthyroidism   Chest pain   History of stroke   Normochromic normocytic anemia    1. Chest pain with elevated troponin -chest pain appears to be constant and atypical.  But however given the elevated troponin I have consulted cardiology.  CT scan of the chest was showing some left pleural effusion.  Patient is already on Plavix and statin. 2. Productive cough with recent treatment for pneumonia -I have continued on antibiotics for now we will check procalcitonin levels.  If negative may discontinue antibiotics. 3. Right upper lobe lesion in the lung concerning for possible malignancy.  Patient's daughter is going to discuss with patient and plans  will be made if any aggressive measures to be pursued. 4. Hyperlipidemia on statins. 5. Hypothyroidism on Tapazole. 6. History of stroke on Plavix and statins. 7. EKG showing possibility of A. fib.  Monitor showing sinus rhythm.  Will obtain cardiology input. 8. History of meningioma recently had gamma knife surgery complicated by cerebral edema requiring Decadron.  Been off Decadron since #11. 9. Recently admitted for pelvic fracture. 10. Normocytic normochromic anemia appears to be chronic. 11. History of Takotsubo cardiomyopathy.   DVT prophylaxis: Lovenox. Code Status: DNR. Family Communication: Patient's daughter. Disposition Plan: To be determined. Consults called: Cardiology and palliative care. Admission status: Observation.   Rise Patience MD [facility] Pager [phone].  If 7PM-7AM, please contact night-coverage [email or web address] Password [facility]  02/07/2018, 6:27 AM

## 2018-02-07 NOTE — Progress Notes (Signed)
Initial Nutrition Assessment  DOCUMENTATION CODES:   Not applicable  INTERVENTION:  Provide Ensure Enlive po BID, each supplement provides 350 kcal and 20 grams of protein.  Encourage adequate PO intake.   NUTRITION DIAGNOSIS:   Increased nutrient needs related to wound healing as evidenced by estimated needs.  GOAL:   Patient will meet greater than or equal to 90% of their needs  MONITOR:   PO intake, Supplement acceptance, Labs, Weight trends, I & O's, Skin  REASON FOR ASSESSMENT:   Malnutrition Screening Tool    ASSESSMENT:   85 y.o. female with a hx of Takotsubo CM, hypothyroid, meningioma status post recent gamma knife resection, coronary artery disease, prior CVA who is being seen today for the evaluation of chest pain and elevated troponin   Pt unavailable during time of visit. RD unable to obtain most recent nutrition history.  Pt with no significant weight loss per weight records. No recent meal completion recorded in flow sheets. RD to order nutritional supplements to aid in caloric and protein needs as well as in wound healing.  Unable to complete Nutrition-Focused physical exam at this time.   Labs and medications reviewed.  Diet Order:   Diet Order            Diet Heart Room service appropriate? Yes; Fluid consistency: Thin  Diet effective now              EDUCATION NEEDS:   Not appropriate for education at this time  Skin:  Skin Assessment: Skin Integrity Issues: Skin Integrity Issues:: Unstageable Unstageable: sacrum  Last BM:  Unknown  Height:   Ht Readings from Last 1 Encounters:  02/06/18 5\' 4"  (1.626 m)    Weight:   Wt Readings from Last 1 Encounters:  02/06/18 50.8 kg    Ideal Body Weight:  54.5 kg  BMI:  Body mass index is 19.22 kg/m.  Estimated Nutritional Needs:   Kcal:  1350-1550  Protein:  55-70 grams  Fluid:  >/= 1.5 L/day    Corrin Parker, MS, RD, LDN Pager # [phone] After hours/ weekend pager #  [phone]

## 2018-02-07 NOTE — Progress Notes (Signed)
[place] OF CARE NOTE Patient: [name] ZBM:[id number]   PCP: Isaac Bliss, Rayford Halsted, MD DOB: 06-Nov-1922   DOA: 02/06/2018   DOS: 02/07/2018    Patient was admitted by my colleague Dr. Hal Hope  earlier on 02/07/2018. I have reviewed the H&P as well as assessment and plan and agree with the same. Important changes in the plan are listed below.  Plan of care: Principal Problem:   Elevated troponin Active Problems:   Essential hypertension   Hyperlipidemia   MENINGIOMA   Takotsubo syndrome   Hyperthyroidism   Chest pain   History of stroke   Normochromic normocytic anemia   Pressure injury of skin Chest pain, less likely cardiac.  Troponins flat. No further work-up recommended cardiology consult signed off.  Community-acquired pneumonia will continue with IV antibiotics and monitor overnight.  Left leg pain. X-ray recently done outpatient unremarkable. Patient reportedly actually having severe weakness while ambulating secondary to pain. PT consulted. We will monitor recommendation. Family would like to go to home with home health if they can but is not averse of the idea of SNF.  Goals of care discussion. Had an extensive discussion with patient's daughter who is at bedside. Patient 85 year old female with dementia with progressive weakness appears to have a new lung mass recurrent meningioma.  With this patient's prognosis remains guarded. Family would like to get information and assistance from hospice if they can. Case management consult for further assistance.  Author: Berle Mull, MD [facility] Pager: [phone] 02/07/2018 5:33 PM   If 7PM-7AM, please contact night-coverage at [email or web address], password [facility]

## 2018-02-08 DIAGNOSIS — N390 Urinary tract infection, site not specified: Secondary | ICD-10-CM | POA: Diagnosis not present

## 2018-02-08 DIAGNOSIS — R7989 Other specified abnormal findings of blood chemistry: Secondary | ICD-10-CM | POA: Diagnosis not present

## 2018-02-08 LAB — CBC WITH DIFFERENTIAL/PLATELET
Abs Immature Granulocytes: 0.03 10*3/uL (ref 0.00–0.07)
Basophils Absolute: 0.1 10*3/uL (ref 0.0–0.1)
Basophils Relative: 1 %
EOS PCT: 5 %
Eosinophils Absolute: 0.3 10*3/uL (ref 0.0–0.5)
HCT: 35.4 % — ABNORMAL LOW (ref 36.0–46.0)
Hemoglobin: 11.1 g/dL — ABNORMAL LOW (ref 12.0–15.0)
Immature Granulocytes: 0 %
Lymphocytes Relative: 25 %
Lymphs Abs: 1.7 10*3/uL (ref 0.7–4.0)
MCH: 28.7 pg (ref 26.0–34.0)
MCHC: 31.4 g/dL (ref 30.0–36.0)
MCV: 91.5 fL (ref 80.0–100.0)
Monocytes Absolute: 1.1 10*3/uL — ABNORMAL HIGH (ref 0.1–1.0)
Monocytes Relative: 16 %
Neutro Abs: 3.6 10*3/uL (ref 1.7–7.7)
Neutrophils Relative %: 53 %
PLATELETS: 256 10*3/uL (ref 150–400)
RBC: 3.87 MIL/uL (ref 3.87–5.11)
RDW: 13.9 % (ref 11.5–15.5)
WBC: 6.7 10*3/uL (ref 4.0–10.5)
nRBC: 0 % (ref 0.0–0.2)

## 2018-02-08 LAB — COMPREHENSIVE METABOLIC PANEL
ALT: 9 U/L (ref 0–44)
AST: 18 U/L (ref 15–41)
Albumin: 3.1 g/dL — ABNORMAL LOW (ref 3.5–5.0)
Alkaline Phosphatase: 72 U/L (ref 38–126)
Anion gap: 9 (ref 5–15)
BUN: 9 mg/dL (ref 8–23)
CO2: 25 mmol/L (ref 22–32)
Calcium: 8.8 mg/dL — ABNORMAL LOW (ref 8.9–10.3)
Chloride: 100 mmol/L (ref 98–111)
Creatinine, Ser: 0.67 mg/dL (ref 0.44–1.00)
GFR calc Af Amer: >60 mL/min (ref >60)
GFR calc non Af Amer: >60 mL/min (ref >60)
Glucose, Bld: 100 mg/dL — ABNORMAL HIGH (ref 70–99)
Potassium: 3.8 mmol/L (ref 3.5–5.1)
Sodium: 134 mmol/L — ABNORMAL LOW (ref 135–145)
Total Bilirubin: 0.8 mg/dL (ref 0.3–1.2)
Total Protein: 6 g/dL — ABNORMAL LOW (ref 6.5–8.1)

## 2018-02-08 MED ORDER — QUETIAPINE FUMARATE 25 MG PO TABS: 12.5000 mg | ORAL_TABLET | Freq: Every day | ORAL | 0 refills | Status: DC

## 2018-02-08 MED ORDER — CEFDINIR 300 MG PO CAPS: 300.0000 mg | ORAL_CAPSULE | Freq: Two times a day (BID) | ORAL | 0 refills | Status: AC

## 2018-02-08 MED ORDER — ENSURE ENLIVE PO LIQD: 237.0000 mL | Freq: Two times a day (BID) | ORAL | 12 refills | Status: AC

## 2018-02-08 MED ORDER — AZITHROMYCIN 500 MG PO TABS: 500.0000 mg | ORAL_TABLET | Freq: Every day | ORAL | 0 refills | Status: DC

## 2018-02-08 NOTE — Progress Notes (Signed)
[facility] of  [facility])  Received request from Dr. Posey Pronto to meet with family to explain Home Palliative vs Home Hospice services. Met with patient's daughter [name] at bedside to answer questions and explain services. She tells me another family member was under [place] two years ago and she is interested in having the same service for her mother. She believes Dr. Elease Hashimoto has already placed the order. Understand from Dr. Posey Pronto patient will be returning home with home health PT and daughter wishes to pursue Home Palliative services. Will update referral center for follow up Monday.   Thank you, Erling Conte, LCSW [phone]

## 2018-02-08 NOTE — Progress Notes (Signed)
MD ordered bladder scan, 972mL scanned. Pts daughter said "she cant pee in the bed if she stands up to the [facility] she will go." RN got pt up to [place] and pt urinated 349mL in the hat, and also urinated on the floor which was unmeasured. MD notified.  Angelica Pou RN

## 2018-02-08 NOTE — Evaluation (Signed)
Physical Therapy Evaluation Patient Details Name: [name] MRN: [id number] DOB: 1922-11-06 Today's Date: 02/08/2018   History of Present Illness  Pt is a 85 y.o. F with significant PMH of falls with resulting right inferior pubic ramus fracture, right radial fracture, Takotsubo CM, meningioma, s/p recent gamma knife resection, CAD, prior CVA who presents with chest pain with elevated troponin and productive cough.  Clinical Impression  Prior to admission, pt lives with daughter and fiance who provide 24/7 assistance and had recently discharged from SNF 2 weeks ago. Patient requires assist for ADL's and all mobility using walker. On PT evaluation, patient performing all functional mobility with min assist. Ambulating 20 feet in room with walker. PT educated and demonstrated proper transfer technique with pt daughter. Pt daughter and fiance demonstrate excellent understanding of fall prevention techniques, including proper cueing, use of gait belt, and guarding technique with patient. Recommend continuing HHPT to maximize functional mobility.     Follow Up Recommendations Home health PT;Supervision/Assistance - 24 hour    Equipment Recommendations  None recommended by PT    Recommendations for Other Services       Precautions / Restrictions Precautions Precautions: Fall Restrictions Weight Bearing Restrictions: No      Mobility  Bed Mobility               General bed mobility comments: OOB in chair  Transfers Overall transfer level: Needs assistance Equipment used: Rolling walker (2 wheeled) Transfers: Sit to/from Stand Sit to Stand: Min assist            Ambulation/Gait Ambulation/Gait assistance: Min assist Gait Distance (Feet): 20 Feet Assistive device: Rolling walker (2 wheeled) Gait Pattern/deviations: Step-through pattern;Decreased stride length;Decreased stance time - right;Trunk flexed Gait velocity: decreased Gait velocity interpretation: <1.31  ft/sec, indicative of household ambulator General Gait Details: Required cues for walker proximity   Stairs            Wheelchair Mobility    Modified Rankin (Stroke Patients Only)       Balance Overall balance assessment: Needs assistance Sitting-balance support: Feet supported Sitting balance-Leahy Scale: Good     Standing balance support: Bilateral upper extremity supported Standing balance-Leahy Scale: Poor                               Pertinent Vitals/Pain Pain Assessment: Faces Faces Pain Scale: No hurt    Home Living Family/patient expects to be discharged to:: Private residence Living Arrangements: Children;Other (Comment)(pt dtr and fiance) Available Help at Discharge: Family;Available 24 hours/day Type of Home: House         Home Equipment: Walker - 2 wheels;Cane - single point      Prior Function Level of Independence: Needs assistance   Gait / Transfers Assistance Needed: Limited household ambulator. Requires assist for all transfers and ambulation using walker. Uses BSC at night  ADL's / Homemaking Assistance Needed: requires assist for all ADL's  Comments: Pt daughter uses a baby monitor at night to be alerted when she needs to help      Hand Dominance   Dominant Hand: Right    Extremity/Trunk Assessment   Upper Extremity Assessment Upper Extremity Assessment: Generalized weakness    Lower Extremity Assessment Lower Extremity Assessment: Generalized weakness    Cervical / Trunk Assessment Cervical / Trunk Assessment: Kyphotic  Communication   Communication: HOH  Cognition Arousal/Alertness: Awake/alert Behavior During Therapy: Flat affect Overall Cognitive Status: History of cognitive impairments -  at baseline                                 General Comments: baseline dementia, sundowns. does not interact much but follows simple commands consistently      General Comments      Exercises      Assessment/Plan    PT Assessment Patient needs continued PT services  PT Problem List Decreased strength;Decreased activity tolerance;Decreased mobility;Decreased balance;Decreased cognition       PT Treatment Interventions DME instruction;Gait training;Functional mobility training;Therapeutic activities;Therapeutic exercise;Balance training;Patient/family education    PT Goals (Current goals can be found in the Care Plan section)  Acute Rehab PT Goals Patient Stated Goal: pt daughter would like to take her home PT Goal Formulation: With patient/family Time For Goal Achievement: 02/22/18 Potential to Achieve Goals: Fair    Frequency Min 3X/week   Barriers to discharge        Co-evaluation               AM-PAC PT "6 Clicks" Mobility  Outcome Measure Help needed turning from your back to your side while in a flat bed without using bedrails?: None Help needed moving from lying on your back to sitting on the side of a flat bed without using bedrails?: A Little Help needed moving to and from a bed to a chair (including a wheelchair)?: A Little Help needed standing up from a chair using your arms (e.g., wheelchair or bedside chair)?: A Little Help needed to walk in hospital room?: A Little Help needed climbing 3-5 steps with a railing? : A Lot 6 Click Score: 18    End of Session Equipment Utilized During Treatment: Gait belt Activity Tolerance: Patient tolerated treatment well Patient left: in chair;with call bell/phone within reach;with family/visitor present Nurse Communication: Mobility status PT Visit Diagnosis: Unsteadiness on feet (R26.81);History of falling (Z91.81)    Time: 7209-4709 PT Time Calculation (min) (ACUTE ONLY): 23 min   Charges:   PT Evaluation $PT Eval Moderate Complexity: 1 Mod PT Treatments $Therapeutic Activity: 8-22 mins        Ellamae Sia, PT, DPT Acute Rehabilitation Services Pager [phone] Office [phone]  Willy Eddy 02/08/2018, 3:42 PM

## 2018-02-09 ENCOUNTER — Telehealth: Payer: Self-pay | Admitting: Internal Medicine

## 2018-02-09 LAB — URINE CULTURE
Culture: >=100000 — AB
Special Requests: NORMAL

## 2018-02-09 NOTE — Discharge Summary (Signed)
[facility] Discharge Summary   Patient: [name] RJJ:[id number]   PCP: Isaac Bliss, Rayford Halsted, MD DOB: 1922/12/18   Date of admission: 02/06/2018   Date of discharge: 02/08/2018    Discharge Diagnoses:  Principal diagnosis UTI  Principal Problem:   Elevated troponin Active Problems:   Essential hypertension   Hyperlipidemia   MENINGIOMA   Takotsubo syndrome   Hyperthyroidism   Chest pain   History of stroke   Normochromic normocytic anemia   Pressure injury of skin   Admitted From: home Disposition: Home with home health, palliative care with hospice of [facility]  Recommendations for Outpatient Follow-up:  1. Please follow-up with PCP in 1 week.  [facility] Follow up.   Why:  They will do your home health care at your home Contact information: [address] [place] [address] [phone]        Isaac Bliss, Rayford Halsted, MD. Schedule an appointment as soon as possible for a visit in 1 week(s).   Specialty:  Internal Medicine Contact information: [place] [address] [phone]          Diet recommendation: regular idet  Activity: The patient is advised to gradually reintroduce usual activities.  Discharge Condition: good  Code Status: DNR/DNI  History of present illness: As per the H and P dictated on admission, "[name] is a 85 y.o. female with history of hypothyroidism, Takotsubo cardiomyopathy, meningioma status post recent gamma knife surgery complicated by brain edema requiring Decadron off Decadron since normal alignment has had a fall has not had a pelvic fracture in the month of November 2019 was recently treated for pneumonia 2 weeks ago had taken Levaquin for 1 week started developing sudden onset of nausea vomiting 1 episode with chills.  Patient also has been complaining of left-sided chest pain persistent for last 1 week.  Patient's family  states that patient has been gradually become bedbound and for mobility has to wear a belt across the chest to move which may be causing the pain.  Family states at times patient gets confused and also has benign left lower extremity weakness for last 2 months since a fall.  Has been followed up by primary care and had x-rays and primary care physician is planning to get cortisone injections for arthritis of the left hip.  ED Course: In the ER CT head was unremarkable.  CT angiogram of the chest shows right upper lobe nodule concerning for possible malignancy.  There also was left sided mild pleural effusion.  Troponin came mildly elevated.  EKG showing possibility of A. fib.  Monitor at this time showing sinus rhythm.  Patient was started on empiric antibiotics for possible pneumonia given that patient was having productive cough for last 2 days.  Patient admitted for further work-up of elevated troponin with chest pain and also enlarging nodule in the right upper lobe.  Family states that patient at times gets confused."  Hospital Course:  Summary of her active problems in the hospital is as following. 1. Chest pain with elevated troponin -chest pain appears to be constant and atypical.  But however given the elevated troponin I have consulted cardiology.  CT scan of the chest was showing some left pleural effusion.  Patient is already on Plavix and statin. 2. Productive cough with recent treatment for pneumonia -I have continued on antibiotics  3. Right upper lobe lesion in the lung concerning for possible  malignancy.  Discussed with daughter who in turn discussed with patient.  They did not want to pursue any further work-up. 4. Hyperlipidemia on statins. 5. Hypothyroidism on Tapazole. 6. History of stroke on Plavix and statins. 7. EKG showing possibility of A. fib.  Monitor showing PACs with sinus rhythm.  No further work-up.  Appreciate cardiology input. 8. History of meningioma recently had  gamma knife surgery complicated by cerebral edema requiring Decadron.  Been off Decadron since #11.  Recent MRI as well as current CT scan suggests that the patient has progressive lesion around that meningioma suggesting recurrence of the disease. 9. Recently admitted for pelvic fracture. 10. Normocytic normochromic anemia appears to be chronic. 11. History of Takotsubo cardiomyopathy. 12. Goals of care discussion. Discussed in detail with patient's daughter at bedside. Patient 85 year old female with dementia with progressive weakness appears to have a new lung mass and recurrent meningioma.  With this patient's prognosis remains guarded.  Hospice was consulted to assist family regarding decision making process.  Family would prefer to continue home with home health for now with palliative care outpatient.  Patient was seen by physical therapy, who recommended home health, which was arranged by case manager. On the day of the discharge the patient's vitals were stable , and no other acute medical condition were reported by patient. the patient was felt safe to be discharge at home with home health.  Consultants: none Procedures: none  DISCHARGE MEDICATION: Allergies as of 02/08/2018      Reactions   Cinnamon Anaphylaxis, Swelling   Throat swells   Atorvastatin Other (See Comments)   Raises liver enzymes (takes pravastatin at home)   Fluorometholone Other (See Comments)   Causes the patient to "fall down"   Influenza Vaccines Other (See Comments)   "Dizziness"   Phenytoin Other (See Comments)   Blood clots   Promethazine Hcl Other (See Comments)   Severe sedation   Sulfamethoxazole Rash      Medication List    STOP taking these medications   cyclobenzaprine 5 MG tablet Commonly known as:  FLEXERIL   Melatonin 1 MG Tabs     TAKE these medications   acetaminophen 500 MG tablet Commonly known as:  TYLENOL Take 500 mg by mouth every 6 (six) hours as needed (for pain or  headaches).   azithromycin 500 MG tablet Commonly known as:  ZITHROMAX Take 1 tablet (500 mg total) by mouth daily.   bisacodyl 10 MG suppository Commonly known as:  DULCOLAX Place 1 suppository (10 mg total) rectally daily as needed for moderate constipation.   cefdinir 300 MG capsule Commonly known as:  OMNICEF Take 1 capsule (300 mg total) by mouth 2 (two) times daily for 3 days.   clopidogrel 75 MG tablet Commonly known as:  PLAVIX Take 1 tablet (75 mg total) by mouth daily with breakfast.   docusate sodium 100 MG capsule Commonly known as:  COLACE Take 1 capsule (100 mg total) by mouth 2 (two) times daily. What changed:    when to take this  reasons to take this   feeding supplement (ENSURE ENLIVE) Liqd Take 237 mLs by mouth 2 (two) times daily between meals.   methimazole 5 MG tablet Commonly known as:  TAPAZOLE TAKE 1 TABLET (5 MG TOTALLY) THREE TIMES A WEEK, MON, WED, AND FRI What changed:    how much to take  how to take this  when to take this  additional instructions   nitroGLYCERIN 0.4 MG SL tablet Commonly  known as:  NITROSTAT Place 1 tablet (0.4 mg total) under the tongue every 5 (five) minutes as needed. For chest pain. What changed:    reasons to take this  additional instructions   pravastatin 20 MG tablet Commonly known as:  PRAVACHOL Take 1 tablet (20 mg total) by mouth every evening.   QUEtiapine 25 MG tablet Commonly known as:  SEROQUEL Take 0.5 tablets (12.5 mg total) by mouth at bedtime.   senna 8.6 MG Tabs tablet Commonly known as:  SENOKOT Take 1 tablet (8.6 mg total) by mouth at bedtime as needed for mild constipation.   Vitamin D (Ergocalciferol) 1.25 MG (50000 UT) Caps capsule Commonly known as:  DRISDOL Take 1 capsule (50,000 Units total) by mouth every 7 (seven) days. What changed:  when to take this      Allergies  Allergen Reactions  . Cinnamon Anaphylaxis and Swelling    Throat swells  . Atorvastatin Other  (See Comments)    Raises liver enzymes (takes pravastatin at home)  . Fluorometholone Other (See Comments)    Causes the patient to "fall down"  . Influenza Vaccines Other (See Comments)    "Dizziness"  . Phenytoin Other (See Comments)    Blood clots  . Promethazine Hcl Other (See Comments)    Severe sedation  . Sulfamethoxazole Rash   Discharge Instructions    Diet - low sodium heart healthy   Complete by:  As directed    Discharge instructions   Complete by:  As directed    It is important that you read the given instructions as well as go over your medication list with RN to help you understand your care after this hospitalization.  Discharge Instructions: Please follow-up with PCP in 1-2 weeks  Please request your primary care physician to go over all Hospital Tests and Procedure/Radiological results at the follow up. Please get all Hospital records sent to your PCP by signing hospital release before you go home.   Do not take more than prescribed Pain, Sleep and Anxiety Medications. You were cared for by a hospitalist during your hospital stay. If you have any questions about your discharge medications or the care you received while you were in the hospital after you are discharged, you can call the unit @UNIT @ you were admitted to and ask to speak with the hospitalist on call if the hospitalist that took care of you is not available.  Once you are discharged, your primary care physician will handle any further medical issues. Please note that NO REFILLS for any discharge medications will be authorized once you are discharged, as it is imperative that you return to your primary care physician (or establish a relationship with a primary care physician if you do not have one) for your aftercare needs so that they can reassess your need for medications and monitor your lab values. You Must read complete instructions/literature along with all the possible adverse reactions/side effects  for all the Medicines you take and that have been prescribed to you. Take any new Medicines after you have completely understood and accept all the possible adverse reactions/side effects.   Increase activity slowly   Complete by:  As directed      Discharge Exam: Filed Weights   02/06/18 1531  Weight: 50.8 kg   Vitals:   02/07/18 2340 02/08/18 0823  BP: 138/87 (!) 151/80  Pulse: 70 (!) 57  Resp: 16 17  Temp: 98.3 F (36.8 C) (!) 97.5 F (36.4 C)  SpO2: 96% 98%   General: Appear in no distress, no Rash; Oral Mucosa moist. Cardiovascular: S1 and S2 Present, no Murmur, no JVD Respiratory: Bilateral Air entry present and Clear to Auscultation, no Crackles, no wheezes Abdomen: Bowel Sound present, Soft and no tenderness Extremities: no Pedal edema, no calf tenderness Neurology: Grossly no focal neuro deficit.  The results of significant diagnostics from this hospitalization (including imaging, microbiology, ancillary and laboratory) are listed below for reference.    Significant Diagnostic Studies: Dg Chest 2 View  Result Date: 02/06/2018 CLINICAL DATA:  Cough congestion fever EXAM: CHEST - 2 VIEW COMPARISON:  12/05/2017, 06/12/2017, chest CT 01/30/2013 FINDINGS: Small left-sided pleural effusion with lingular and basilar airspace disease. Cardiomegaly with aortic atherosclerosis. Irregular opacity in the right upper lobe, likely corresponding to focal scarring on comparison chest CT from 2014. No pneumothorax. IMPRESSION: 1. Small left-sided pleural effusion with lingular and left basilar atelectasis or pneumonia 2. Cardiomegaly Electronically Signed   By: Donavan Foil M.D.   On: 02/06/2018 17:28   Dg Chest 2 View  Result Date: 01/20/2018 CLINICAL DATA:  Cough. EXAM: CHEST - 2 VIEW COMPARISON:  12/15/2017, 08/13/2013, 04/29/2012.  CT 01/30/2013. FINDINGS: Mediastinum hilar structures are normal. Cardiomegaly with diffuse bilateral from interstitial prominence and bilateral pleural  effusions consistent CHF. Right upper lobe infiltrate. This suggest pneumonia. Underlying mass lesion can not be excluded. Follow-up exams demonstrate clearing suggested. Degenerative changes scoliosis thoracic spine. IMPRESSION: 1. Congestive heart failure bilateral from interstitial edema bilateral pleural effusions. 2. Ill-defined right upper lobe infiltrate noted in area previously identified scarring. This suggest pneumonia. Underlying mass lesion can not be excluded. Followup PA and lateral chest X-ray is recommended in 3-4 weeks following trial of antibiotic therapy to ensure resolution and exclude underlying malignancy. Electronically Signed   By: Marcello Moores  Register   On: 01/20/2018 06:53   Ct Head Wo Contrast  Result Date: 02/06/2018 CLINICAL DATA:  Focal neuro deficit, > 6 hrs, stroke suspected. 85 year old with sudden onset of vomiting. History of meningioma post resection. EXAM: CT HEAD WITHOUT CONTRAST TECHNIQUE: Contiguous axial images were obtained from the base of the skull through the vertex without intravenous contrast. COMPARISON:  Head CT 06/12/2017, brain MRI 06/14/2017 FINDINGS: Brain: Progressive decreased density in the posterior right parietal lobe, superior to known meningioma, lesion better defined on MRI. Additional area of encephalomalacia in the right parietooccipital lobe adjacent to prior craniotomy is unchanged. No hemorrhage. No midline shift. No subdural collection. Chronic small vessel ischemia is otherwise similar to prior. No hydrocephalus. The basilar cisterns are patent. Vascular: No hyperdense vessel. Skull: Prior right parietooccipital craniotomy with bony resorption at the craniotomy defect related to known meningioma. Overall appearance is not significantly changed from May 2019 CT no new focal lesion. Sinuses/Orbits: No acute findings. Other: None. IMPRESSION: 1. Increasing right parietal gliosis adjacent to known meningioma, lesion grossly similar in appearance to May  2019 CT. MRI provided greater detail of lesion evaluation. 2. No hemorrhage or other acute findings. Electronically Signed   By: Keith Rake M.D.   On: 02/06/2018 20:06   Ct Angio Chest Pe W Or Wo Contrast  Result Date: 02/06/2018 CLINICAL DATA:  Sudden onset of vomiting and body aches. History of pulmonary nodule. History of a brain tumor. EXAM: CT ANGIOGRAPHY CHEST WITH CONTRAST TECHNIQUE: Multidetector CT imaging of the chest was performed using the standard protocol during bolus administration of intravenous contrast. Multiplanar CT image reconstructions and MIPs were obtained to evaluate the vascular anatomy. CONTRAST:  133mL  ISOVUE-370 IOPAMIDOL (ISOVUE-370) INJECTION 76% COMPARISON:  Chest radiograph, 02/06/2018.  Chest CTA, 01/30/2013. FINDINGS: Cardiovascular: There is satisfactory opacification of the pulmonary arteries to the segmental level. There is no evidence of a pulmonary embolism. Heart is mildly enlarged. Trace pericardial effusion. Two vessel coronary artery calcifications. Ascending aorta dilated to 4.2 cm. Aortic atherosclerosis. No dissection. Mediastinum/Nodes: Mild thyroid enlargement. Poorly defined thyroid nodules, largest in the left lobe measuring 1.8 cm. No neck base or axillary masses or adenopathy. No mediastinal or hilar masses or pathologically enlarged lymph nodes. Trachea is prominent, widely patent. Mild distention of the mid to upper esophagus. Esophagus otherwise unremarkable. Lungs/Pleura: Small left pleural effusion. There is dependent atelectasis in the left lower lobe. In the right upper lobe, there is bandlike opacity that is contiguous with a round opacity with irregular to spiculated margins. This latter round opacity is centered on image 28, series 5, measuring 15 mm. The contiguous opacity extends to the posterosuperior pleural margins of the right upper lobe and joints mild apical pleuroparenchymal scarring. This opacity has increased when compared to the  prior CT, particularly around component. There is minor dependent subsegmental atelectasis in the right lower lobe. Small calcified granuloma in the left upper lobe. 4 mm noncalcified nodule in the left upper lobe, image 38, series 5, stable from the prior exam, benign. Upper Abdomen: No acute finding. Musculoskeletal: No fracture or acute finding. No osteoblastic or osteolytic lesions. Vertebral hemangiomas noted in T6 and T12. Review of the MIP images confirms the above findings. IMPRESSION: 1. No evidence of a pulmonary embolism. 2. Opacity in the right upper lobe, increased compared the prior CT. This has a rounded, nodular component that measures 15 mm. This is suspicious for malignancy developing in the setting of scarring. Recommend follow-up PET-CT or tissue sampling. 3. Small left pleural effusion. Left lower lobe atelectasis. No convincing pneumonia. No pulmonary edema. 4. Cardiomegaly. 5. 4.2 cm ascending thoracic aortic aneurysm. Recommend annual imaging followup by CTA or MRA. This recommendation follows 2010 ACCF/AHA/AATS/ACR/ASA/SCA/SCAI/SIR/STS/SVM Guidelines for the Diagnosis and Management of Patients with Thoracic Aortic Disease. Circulation. 2010; 121: e266-e369 6. Two vessel coronary artery calcifications. 7. Left lobe thyroid nodule which may be increased from the prior CT. Measures 1.8 cm. Consider follow-up ultrasound for further characterization. Aortic aneurysm NOS (ICD10-I71.9). Electronically Signed   By: Lajean Manes M.D.   On: 02/06/2018 20:09   Dg Knee Complete 4 Views Left  Result Date: 01/26/2018 CLINICAL DATA:  Left leg pain EXAM: LEFT KNEE - COMPLETE 4+ VIEW COMPARISON:  None. FINDINGS: No acute fracture or dislocation. Focal cortical thickening along the lateral aspect of the left proximal fibular diaphysis likely reflecting a healed stress fracture or ossified NOF. Generalized osteopenia. No joint effusion. Joint spaces are relatively well maintained. Peripheral vascular  atherosclerotic disease. IMPRESSION: No acute osseous injury of the left knee. Electronically Signed   By: Kathreen Devoid   On: 01/26/2018 15:02   Dg Hip Unilat With Pelvis 2-3 Views Left  Result Date: 01/26/2018 CLINICAL DATA:  Left hip pain. EXAM: DG HIP (WITH OR WITHOUT PELVIS) 2-3V LEFT COMPARISON:  12/29/2017. FINDINGS: Total right hip replacement. Anatomic alignment. Hardware intact. Diffuse severe osteopenia. Severe degenerative changes lumbar spine and left hip. IMPRESSION: 1.  Total right hip replacement.  Anatomic alignment. 2. Diffuse severe osteopenia. Severe degenerative changes lumbar spine and left hip. No acute bony abnormality. No evidence of hip fracture. Exam appears stable from 12/29/2017. Electronically Signed   By: Marcello Moores  Register   On:  01/26/2018 15:03    Microbiology: Recent Results (from the past 240 hour(s))  Urine Culture     Status: Abnormal   Collection Time: 02/06/18  6:56 PM  Result Value Ref Range Status   Specimen Description   Final    URINE, CLEAN CATCH Performed at [facility], [address] [place], [place] [address]    Special Requests   Final    Normal Performed at [facility], [address] [place], [place] [address]    Culture (A)  Final    >=100,000 COLONIES/mL AEROCOCCUS URINAE WITH MIXED BACTERIAL ORGANISMS Standardized susceptibility testing for this organism is not available. Performed at [facility], [place] [address] [place], [place] [address]    Report Status 02/09/2018 FINAL  Final     Labs: CBC: Recent Labs  Lab 02/06/18 1650 02/07/18 0634 02/08/18 0314  WBC 11.4* 7.6 6.7  NEUTROABS  --  3.9 3.6  HGB 11.0* 10.7* 11.1*  HCT 35.2* 35.0* 35.4*  MCV 94.1 93.1 91.5  PLT 257 227 503   Basic Metabolic Panel: Recent Labs  Lab 02/06/18 1650 02/07/18 0634 02/08/18 0314  NA 134* 137 134*  K 3.7 3.4* 3.8  CL 101 102 100  CO2 26 27 25   GLUCOSE 133* 90 100*  BUN 14 9 9   CREATININE 0.54 0.75  0.67  CALCIUM 8.7* 8.7* 8.8*  MG  --  1.6*  --    Liver Function Tests: Recent Labs  Lab 02/06/18 1650 02/07/18 0634 02/08/18 0314  AST 20 18 18   ALT 9 9 9   ALKPHOS 80 70 72  BILITOT 0.4 0.2* 0.8  PROT 6.3* 6.1* 6.0*  ALBUMIN 3.4* 3.0* 3.1*   No results for input(s): LIPASE, AMYLASE in the last 168 hours. No results for input(s): AMMONIA in the last 168 hours. Cardiac Enzymes: Recent Labs  Lab 02/06/18 1650 02/06/18 1917 02/07/18 0102 02/07/18 0634  TROPONINI 0.05* 0.06* 0.05* 0.04*   BNP (last 3 results) Recent Labs    02/07/18 0634  BNP 1,000.3*   CBG: No results for input(s): GLUCAP in the last 168 hours. Time spent: 35 minutes  Signed:  Berle Mull  [facility] 02/08/2018 , 3:33 PM

## 2018-02-09 NOTE — Telephone Encounter (Signed)
Copied from [place] [id number]. Topic: Quick Communication - See Telephone Encounter >> Feb 09, 2018  8:27 AM Reyne Dumas L wrote: CRM for notification. See Telephone encounter for: 02/09/18.  Stacy from Palliative Care with [facility] calling.  States pt was discharged yesterday from [facility] and the hospital liaison contacted them and recommended Palliative Care. Marzetta Board would like to know if PCP is okay with Palliative care for pt. Marzetta Board can be reached at [phone]

## 2018-02-10 ENCOUNTER — Telehealth: Payer: Self-pay

## 2018-02-10 ENCOUNTER — Telehealth: Payer: Self-pay | Admitting: Internal Medicine

## 2018-02-10 NOTE — Telephone Encounter (Signed)
Copied from [place] [id number]. Topic: Quick Communication - Home Health Verbal Orders >> Feb 10, 2018 11:28 AM Yvette Rack wrote: Caller/Agency: Claiborne Billings with [place] Number: [phone] Requesting OT/PT/Skilled Nursing/Social Work: PT  Frequency: 1 time a week for 4 weeks

## 2018-02-10 NOTE — Telephone Encounter (Signed)
Verbal orders given to Stacey. 

## 2018-02-10 NOTE — Telephone Encounter (Signed)
Visit scheduled with Palliative Care for 02/11/2018.

## 2018-02-10 NOTE — Telephone Encounter (Signed)
Absolutely.

## 2018-02-11 ENCOUNTER — Telehealth: Payer: Self-pay | Admitting: Internal Medicine

## 2018-02-11 ENCOUNTER — Other Ambulatory Visit: Payer: PRIVATE HEALTH INSURANCE | Admitting: Internal Medicine

## 2018-02-11 DIAGNOSIS — Z515 Encounter for palliative care: Secondary | ICD-10-CM

## 2018-02-11 DIAGNOSIS — M159 Polyosteoarthritis, unspecified: Secondary | ICD-10-CM

## 2018-02-11 DIAGNOSIS — Z8781 Personal history of (healed) traumatic fracture: Secondary | ICD-10-CM

## 2018-02-11 DIAGNOSIS — M8949 Other hypertrophic osteoarthropathy, multiple sites: Secondary | ICD-10-CM

## 2018-02-11 DIAGNOSIS — M150 Primary generalized (osteo)arthritis: Principal | ICD-10-CM

## 2018-02-11 NOTE — Telephone Encounter (Signed)
Copied from [place] [id number]. Topic: Quick Communication - Home Health Verbal Orders >> Feb 11, 2018  3:30 PM Rutherford Nail, [place] wrote: Caller/Agency: Donita RN with [facility] Number: [phone] (can leave a voicemail) Requesting OT/PT/Skilled Nursing/Social Work: Speech therapy and medical social worker Frequency: Evaluations

## 2018-02-11 NOTE — Progress Notes (Signed)
[facility] Telephone: [phone] Fax: [phone]  PATIENT NAME: [name] DOB: 1922-08-25 MRN: [id number]  PRIMARY CARE PROVIDER:   Isaac Boyer, Sarah Halsted, MD  REFERRING PROVIDER:  Isaac Boyer, Sarah Halsted, MD [place], [place] [address]  RESPONSIBLE PARTY:  [name](daughter/POA) [phone] Joint caregiver: [name] [phone]    RECOMMENDATIONS and PLAN:  1. Palliative Care Encounter Z51.5:   -Advanced Care Planning: Discussions related to goals of care, advanced care planning and symptom management.  Previously selected DNAR.  Golden rod form completed and given to daughter.  MOST form was reviewed and selections made including Intermediate interventions, antibiotics if indicated, IV fluids for a trial period. No decision made related to tube feedings but pt stated that she wanted to die a natural death. Hospice care reviewed however, daughter wants pt to complete home PT prior to transition to Hospice.  -Memory loss with behaviors, Depression:  FAST stage 7c. Continue [name] 12.5mg  at HS.  Consider [name] Sprinkles 125mg  QD for aggressive behaviors.  Behavioral health evaluation due to complex past history of patient being [place] in her youth and frequent conversation related to same now.  Protein calorie malnutrition with dehydration- Most recent Albumin/Protein level were 3.1/6. BMI 19 during last hospitalization. Attempt to increase nutritional intake and hydration but is complicated by dysphagia and pt food preferences.  Daughter is hesitant on tube feedings in the future.  Monitor weight weekly.  -Dysphagia:  Trial of thickener(OTC) in liquids.  Aspiration risk per previous swallow evaluation.  Exercises as recommended by speech therapist.  -Urinary retention: Chronic.  Encouraged running water, warm compresses on pelvis during attempts to urinate.  Improve bowel function that  exacerbates urine retention.  Consider [name] 0.4mg  prn retention.  -Altered gait:  Fall risk precautions.  Daughter desires home PT at this time.  She will notify this NP if additional decline occurs prior to next home visit.   I spent 160 minutes providing this consultation,  from 11:00am to 1:40pm at patient bedside along with daughter and her fiance at daughter's home. More than 50% of the time in this consultation was spent coordinating communication with patient, daughter and fiance.  Reviewed medical records, medicaton and past history. Telephone conversation with [name] at Dr. Cherly Hensen office in reference to today's consult visit. Recommendations explained. Palliative care will continue to follow and patient will still require management from PCP.  HISTORY OF PRESENT ILLNESS:  [name] is a 85 y.o. year old female with multiple medical problems including dementia, previous CVAs, MI, frequently occurring falls resulting in pelvic fracture in 12/2017.  Hospitalized on 1/4-02/07/2018 due to PNA, enlargement of pulmonary nodes that are thought to be malignant(no planned evaluation or tratments),UTI and increased weakness .  Reports a meningioma initially in 2008 with reoccurence.  She had Gamma Knife procedures in 2008 and 07/2017.  She declines any additional treatment if recommended for same.  She lived alone in her home until 06/2017 and was ambulatory and was able to perform her own ADLs prior to fall in 12/2017.  She is currently chair or bedbound, has a great deal of difficulty with ambulating short distances with walker and manual assist to restroom or bedside commode.  She is incontinent of B&B but has experienced urinary retention during recent hospitalization. Daughter reports similar occurrences at home intermittency which interfere with normal bowel movements. She feeds herself finger foods, is resistant to drink adequate beverages.  Most recent weight was 111#  which has decreased from  115# prior to 12/2017.  Baseline weight weight is 165# >20yrs ago.  Palliative Care was asked to help address goals of care.   CODE STATUS: DNAR/DNI  PPS: 30% HOSPICE ELIGIBILITY/DIAGNOSIS: Protein calorie malnutrition, suspected lung CA without planned treatments(Currently receiving home PT)  PAST MEDICAL HISTORY:  Past Medical History:  Diagnosis Date  . Abnormal CT of the chest    Stable patchy/nodular and ground-glass opacities in the right upper lobe, While grossly unchanged, low grade adenocarcinoma remains possible.  Marland Kitchen ANEMIA, PERNICIOUS 09/10/2006  . Bradycardia    August, 2013  . CORONARY ARTERY DISEASE 11/07/2008   a. NSTEMI 2001 & STEMI 2008 managed medically. b. Takotsubo 06/2006. c. Cath 2009 -> med rx.  . CVA WITH LEFT HEMIPARESIS 07/22/2007   May, 2009, neurology decided aspirin and Plavix at that time.  . DERMATITIS 03/02/2007  . DJD (degenerative joint disease)    L shoulder impingement  . Ejection fraction    EF 60%, echo, July, 2010, (  EF improved after tach a suitable event 2008)  . Family history of anesthesia complication    daughter has a hard time waking up  . H/O hiatal hernia   . H/O major orthopedic surgery    rotator cuff R  . HYPERTENSION 07/15/2006  . IBS (irritable bowel syndrome)   . MENINGIOMA 08/27/2006   Occipital meningioma, surgery, [facility], 2008 /  MRI, [facility], June, 2011, no change in lesion, history believe the patient had a gamma knife treatment of a right occipital atypical meningioma  . Myocardial infarction ([facility])   . OSTEOARTHRITIS 07/15/2006  . Pelvic fracture ([facility])   . PULMONARY NODULE 01/14/2007   [facility], December, 2011  . Stroke ([facility])   . Subarachnoid hemorrhage following injury ([facility]) 2007   Subarachnoid hemorrhage secondary to a fall December, 2007  . Takotsubo syndrome 04/22/2008   MI, May, 2008, question takotsubo event  . THYROID NODULE 01/14/2007    SOCIAL HX:  Social History   Tobacco Use  . Smoking status: Never Smoker  .  Smokeless tobacco: Never Used  Substance Use Topics  . Alcohol use: No    Alcohol/week: 0.0 standard drinks    ALLERGIES:  Allergies  Allergen Reactions  . Cinnamon Anaphylaxis and Swelling    Throat swells  . Atorvastatin Other (See Comments)    Raises liver enzymes (takes pravastatin at home)  . Fluorometholone Other (See Comments)    Causes the patient to "fall down"  . Influenza Vaccines Other (See Comments)    "Dizziness"  . Phenytoin Other (See Comments)    Blood clots  . Promethazine Hcl Other (See Comments)    Severe sedation  . Sulfamethoxazole Rash     PERTINENT MEDICATIONS:  Outpatient Encounter Medications as of 02/11/2018  Medication Sig  . acetaminophen (TYLENOL) 500 MG tablet Take 500 mg by mouth every 6 (six) hours as needed (for pain or headaches).   Marland Kitchen azithromycin (ZITHROMAX) 500 MG tablet Take 1 tablet (500 mg total) by mouth daily.  . bisacodyl (DULCOLAX) 10 MG suppository Place 1 suppository (10 mg total) rectally daily as needed for moderate constipation.  . cefdinir (OMNICEF) 300 MG capsule Take 1 capsule (300 mg total) by mouth 2 (two) times daily for 3 days.  . clopidogrel (PLAVIX) 75 MG tablet Take 1 tablet (75 mg total) by mouth daily with breakfast.  . docusate sodium (COLACE) 100 MG capsule Take 1 capsule (100 mg total) by mouth 2 (two) times daily. (Patient taking  differently: Take 100 mg by mouth 2 (two) times daily as needed for mild constipation or moderate constipation. )  . feeding supplement, ENSURE ENLIVE, (ENSURE ENLIVE) LIQD Take 237 mLs by mouth 2 (two) times daily between meals.  . methimazole (TAPAZOLE) 5 MG tablet TAKE 1 TABLET (5 MG TOTALLY) THREE TIMES A WEEK, MON, WED, AND FRI (Patient taking differently: Take 5 mg by mouth every Monday, Wednesday, and Friday. )  . nitroGLYCERIN (NITROSTAT) 0.4 MG SL tablet Place 1 tablet (0.4 mg total) under the tongue every 5 (five) minutes as needed. For chest pain. (Patient taking differently: Place  0.4 mg under the tongue every 5 (five) minutes as needed for chest pain. )  . pravastatin (PRAVACHOL) 20 MG tablet Take 1 tablet (20 mg total) by mouth every evening.  Marland Kitchen QUEtiapine ([name]) 25 MG tablet Take 0.5 tablets (12.5 mg total) by mouth at bedtime.  . senna (SENOKOT) 8.6 MG TABS tablet Take 1 tablet (8.6 mg total) by mouth at bedtime as needed for mild constipation.  . Vitamin D, Ergocalciferol, (DRISDOL) 1.25 MG (50000 UT) CAPS capsule Take 1 capsule (50,000 Units total) by mouth every 7 (seven) days. (Patient taking differently: Take 50,000 Units by mouth every Wednesday. )   No facility-administered encounter medications on file as of 02/11/2018.     PHYSICAL EXAM:   General: NAD, frail appearing, thin Cardiovascular: regular rate and rhythm Pulmonary: clear ant fields Abdomen: soft, nontender, + bowel sounds GU: no suprapubic tenderness Extremities: no edema, no joint deformities Skin: no rashes Neurological: Weakness but otherwise nonfocal  Gonzella Lex, NP

## 2018-02-11 NOTE — Telephone Encounter (Signed)
Will address at appointment 02/13/2018

## 2018-02-13 ENCOUNTER — Telehealth: Payer: Self-pay | Admitting: *Deleted

## 2018-02-13 ENCOUNTER — Ambulatory Visit (INDEPENDENT_AMBULATORY_CARE_PROVIDER_SITE_OTHER): Payer: Medicare Other | Admitting: Internal Medicine

## 2018-02-13 ENCOUNTER — Encounter: Payer: Self-pay | Admitting: Internal Medicine

## 2018-02-13 VITALS — BP 124/80 | HR 78 | Temp 98.6°F

## 2018-02-13 DIAGNOSIS — B070 Plantar wart: Secondary | ICD-10-CM | POA: Diagnosis not present

## 2018-02-13 DIAGNOSIS — E43 Unspecified severe protein-calorie malnutrition: Secondary | ICD-10-CM

## 2018-02-13 DIAGNOSIS — R410 Disorientation, unspecified: Secondary | ICD-10-CM

## 2018-02-13 DIAGNOSIS — R7989 Other specified abnormal findings of blood chemistry: Secondary | ICD-10-CM | POA: Diagnosis not present

## 2018-02-13 DIAGNOSIS — R5381 Other malaise: Secondary | ICD-10-CM | POA: Diagnosis not present

## 2018-02-13 DIAGNOSIS — B354 Tinea corporis: Secondary | ICD-10-CM | POA: Diagnosis not present

## 2018-02-13 DIAGNOSIS — I5032 Chronic diastolic (congestive) heart failure: Secondary | ICD-10-CM

## 2018-02-13 DIAGNOSIS — R778 Other specified abnormalities of plasma proteins: Secondary | ICD-10-CM

## 2018-02-13 DIAGNOSIS — I5181 Takotsubo syndrome: Secondary | ICD-10-CM

## 2018-02-13 MED ORDER — QUETIAPINE FUMARATE 25 MG PO TABS: 25.0000 mg | ORAL_TABLET | Freq: Every day | ORAL | 1 refills | Status: AC

## 2018-02-13 MED ORDER — TRAMADOL HCL 50 MG PO TABS: 50.0000 mg | ORAL_TABLET | Freq: Two times a day (BID) | ORAL | 1 refills | Status: AC | PRN

## 2018-02-13 NOTE — Progress Notes (Signed)
Established Patient Office Visit     CC/Reason for Visit: Establish care, follow-up on chronic medical conditions, hospital follow-up  HPI: [name] is a 85 y.o. female who is coming in today for the above mentioned reasons. Past Medical History is significant for: Hypothyroidism, Takotsubo cardiomyopathy, meningioma status post recent gamma knife surgery complicated by brain edema requiring Decadron.  She suffered a pelvic fracture in November 2019 and was treated for pneumonia.  She was at skilled nursing facility for this and wall fluid was discharged home.  While at home she started experiencing vomiting, chills, low-grade fever, left-sided chest pain that had been persistent for the past week prior to admission.  She went to the hospital to be evaluated.  Was found to have elevated cardiac enzymes.  Was found to have a new lung mass that was suspicious for malignancy.  While in the hospital she had significant hospital delirium, was prescribed Seroquel which she has done well with, daughter is wondering if we should increase the dose.    Have spent a lot of time discussing goals of care both short and long-term.  Patient in her words, and daughter has confirmed to me, would like to be a DO NOT RESUSCITATE.  They would not like to address problems in a curative fashion, instead they would like to focus on comfort and dignity.  She is already enrolled in palliative care.  I have brought to the table the subject of hospice I believe she would be an excellent hospice candidate.  Daughter and her fianc are the sole caregivers for patient which has been very difficult for them.  Her chest pain is relieved with nitroglycerin which brings to mind concerns about coronary artery disease.  As discussed with them, no aggressive interventions are desired.  Daughter would also like something in addition to Tylenol for pain as nights are difficult.  She has an appointment coming up with orthopedics  to consider hip injections for her left leg pain.  I agree with any and all comfort/pain relieving strategies.  Patient is very disappointed that she is no longer independent and ambulatory.  She has had a difficult time coming to terms with the fact that she may no longer be able to walk.   Past Medical/Surgical History: Past Medical History:  Diagnosis Date  . Abnormal CT of the chest    Stable patchy/nodular and ground-glass opacities in the right upper lobe, While grossly unchanged, low grade adenocarcinoma remains possible.  Marland Kitchen ANEMIA, PERNICIOUS 09/10/2006  . Bradycardia    August, 2013  . CORONARY ARTERY DISEASE 11/07/2008   a. NSTEMI 2001 & STEMI 2008 managed medically. b. Takotsubo 06/2006. c. Cath 2009 -> med rx.  . CVA WITH LEFT HEMIPARESIS 07/22/2007   May, 2009, neurology decided aspirin and Plavix at that time.  . DERMATITIS 03/02/2007  . DJD (degenerative joint disease)    L shoulder impingement  . Ejection fraction    EF 60%, echo, July, 2010, (  EF improved after tach a suitable event 2008)  . Family history of anesthesia complication    daughter has a hard time waking up  . H/O hiatal hernia   . H/O major orthopedic surgery    rotator cuff R  . HYPERTENSION 07/15/2006  . IBS (irritable bowel syndrome)   . MENINGIOMA 08/27/2006   Occipital meningioma, surgery, [facility], 2008 /  MRI, [facility], June, 2011, no change in lesion, history believe the patient had a gamma knife treatment of a  right occipital atypical meningioma  . Myocardial infarction ([facility])   . OSTEOARTHRITIS 07/15/2006  . Pelvic fracture ([facility])   . PULMONARY NODULE 01/14/2007   [facility], December, 2011  . Stroke ([facility])   . Subarachnoid hemorrhage following injury ([facility]) 2007   Subarachnoid hemorrhage secondary to a fall December, 2007  . Takotsubo syndrome 04/22/2008   MI, May, 2008, question takotsubo event  . THYROID NODULE 01/14/2007    Past Surgical History:  Procedure Laterality Date  . ABDOMINAL  HYSTERECTOMY    . BRAIN TUMOR EXCISION     Meningioma; [facility] at [facility] in [facility]  . CHOLECYSTECTOMY    . JOINT REPLACEMENT     rt THR  . SHOULDER SURGERY      Social History:  reports that she has never smoked. She has never used smokeless tobacco. She reports that she does not drink alcohol or use drugs.  Allergies: Allergies  Allergen Reactions  . Cinnamon Anaphylaxis and Swelling    Throat swells  . Atorvastatin Other (See Comments)    Raises liver enzymes (takes pravastatin at home)  . Fluorometholone Other (See Comments)    Causes the patient to "fall down"  . Influenza Vaccines Other (See Comments)    "Dizziness"  . Phenytoin Other (See Comments)    Blood clots  . Promethazine Hcl Other (See Comments)    Severe sedation  . Sulfamethoxazole Rash    Family History: Patient Family History  Problem Relation Age of Onset  . Heart attack Brother 35  . Stroke Brother   . Diabetes Brother   . Stroke Sister      Current Outpatient Medications:  .  acetaminophen (TYLENOL) 500 MG tablet, Take 500 mg by mouth every 6 (six) hours as needed (for pain or headaches). , Disp: , Rfl:  .  bisacodyl (DULCOLAX) 10 MG suppository, Place 1 suppository (10 mg total) rectally daily as needed for moderate constipation., Disp: 12 suppository, Rfl: 0 .  clopidogrel (PLAVIX) 75 MG tablet, Take 1 tablet (75 mg total) by mouth daily with breakfast., Disp: 90 tablet, Rfl: 3 .  docusate sodium (COLACE) 100 MG capsule, Take 1 capsule (100 mg total) by mouth 2 (two) times daily. (Patient taking differently: Take 100 mg by mouth 2 (two) times daily as needed for mild constipation or moderate constipation. ), Disp: 60 capsule, Rfl: 0 .  feeding supplement, ENSURE ENLIVE, (ENSURE ENLIVE) LIQD, Take 237 mLs by mouth 2 (two) times daily between meals., Disp: 237 mL, Rfl: 12 .  methimazole (TAPAZOLE) 5 MG tablet, TAKE 1 TABLET (5 MG TOTALLY) THREE TIMES A WEEK, MON, WED, AND FRI (Patient taking  differently: Take 5 mg by mouth every Monday, Wednesday, and Friday. ), Disp: 14 tablet, Rfl: 4 .  nitroGLYCERIN (NITROSTAT) 0.4 MG SL tablet, Place 1 tablet (0.4 mg total) under the tongue every 5 (five) minutes as needed. For chest pain. (Patient taking differently: Place 0.4 mg under the tongue every 5 (five) minutes as needed for chest pain. ), Disp: 15 tablet, Rfl: 3 .  pravastatin (PRAVACHOL) 20 MG tablet, Take 1 tablet (20 mg total) by mouth every evening., Disp: 30 tablet, Rfl: 4 .  QUEtiapine (SEROQUEL) 25 MG tablet, Take 1 tablet (25 mg total) by mouth at bedtime., Disp: 30 tablet, Rfl: 1 .  senna (SENOKOT) 8.6 MG TABS tablet, Take 1 tablet (8.6 mg total) by mouth at bedtime as needed for mild constipation., Disp: 30 each, Rfl: 0 .  Vitamin D, Ergocalciferol, (DRISDOL) 1.25 MG (50000  UT) CAPS capsule, Take 1 capsule (50,000 Units total) by mouth every 7 (seven) days. (Patient taking differently: Take 50,000 Units by mouth every Wednesday. ), Disp: 5 capsule, Rfl: 0 .  traMADol (ULTRAM) 50 MG tablet, Take 1 tablet (50 mg total) by mouth every 12 (twelve) hours as needed for severe pain., Disp: 60 tablet, Rfl: 1  Review of Systems:  Constitutional: Denies fever, chills, diaphoresis, positive fatigue and appetite change.   HEENT: Denies photophobia, eye pain, redness, hearing loss, ear pain, congestion, sore throat, rhinorrhea, sneezing, mouth sores, trouble swallowing, neck pain, neck stiffness and tinnitus.   Respiratory: Denies SOB, DOE, cough, chest tightness,  and wheezing.   Cardiovascular: Denies chest pain, palpitations and leg swelling.  Gastrointestinal: Denies nausea, vomiting, abdominal pain, diarrhea, constipation, blood in stool and abdominal distention.  Genitourinary: Denies dysuria, urgency, frequency, hematuria, flank pain and difficulty urinating.  Endocrine: Denies: hot or cold intolerance, sweats, changes in hair or nails, polyuria, polydipsia. Musculoskeletal: Positive  for myalgias, back pain, joint swelling, arthralgias and gait problem.  Skin: Denies pallor, rash and wound.  Neurological: Denies dizziness, seizures, syncope, weakness, light-headedness, numbness and headaches.  Hematological: Denies adenopathy. Easy bruising, personal or family bleeding history  Psychiatric/Behavioral: Denies suicidal ideation, mood changes, confusion, nervousness, sleep disturbance and agitation    Physical Exam: Vitals:   02/13/18 1328  BP: 124/80  Pulse: 78  Temp: 98.6 F (37 C)  TempSrc: Oral  SpO2: 96%    There is no height or weight on file to calculate BMI.   Constitutional: NAD, calm, comfortable, thin cachectic Eyes: PERRL, lids and conjunctivae normal ENMT: Mucous membranes are moist. Respiratory: clear to auscultation bilaterally, no wheezing, no crackles. Normal respiratory effort. No accessory muscle use.  Cardiovascular: Regular rate and rhythm, no murmurs / rubs / gallops. No extremity edema. 2+ pedal pulses. No carotid bruits.  Abdomen: no tenderness, no masses palpated. No hepatosplenomegaly. Bowel sounds positive.  Musculoskeletal: no clubbing / cyanosis. No joint deformity upper and lower extremities. Good ROM, no contractures. Normal muscle tone.  Skin: no rashes, lesions, ulcers. No induration Neurologic: Wheelchair-bound, alert, awake, oriented x3 Psychiatric: Normal judgment and insight. Alert and oriented x 3. Normal mood.    Impression and Plan:  Physical deconditioning  -Have highly suggest transitioning over to hospice. -We will order tramadol 50 mg twice daily to use initially only at bedtime to monitor for side effects such as increased sedation or hallucinations. -If we transition fully over to hospice can consider more aggressive pain management as I suspect she will need it. -Do not see benefit for continued physical therapy at this point.  Ringworm of body -Recommended over-the-counter Lamisil cream.  Plantar  warts -Have recommended Compound W over-the-counter patches wrapped in Saran wrap to change every other day stop  Elevated troponin and Chest Pain -She was evaluated for this in the hospital. -Patient and daughter do not want aggressive interventions or further evaluation. -Pain is relieved with nitroglycerin which she will continue.  Protein-calorie malnutrition, severe  Chronic diastolic CHF (congestive heart failure) (HCC) -Compensated  Takotsubo syndrome  Delirium  -We will increase Seroquel from 12.5 to 25 mg at bedtime.    Patient Instructions  -It was nice meeting you today!  -Lamisil cream OTC for ringworm.  -Compound W patches and saran wrap for plantar warts. Switch out every 2 days.  -Increase seroquel to 25 mg at bedtime.  -Tramadol 50 mg up to twice daily as needed for pain, but start out at bedtime  only for the first week.   Plantar Warts Plantar warts are small growths on the bottom of the foot (sole). Warts are caused by a type of germ (virus). Most warts are not painful, and they usually do not cause problems. Sometimes, plantar warts can cause pain when you walk. Warts often go away on their own in time. They can also spread to other areas of the body. Treatments may be done if needed. What are the causes?  Plantar warts are caused by a germ that is called human papillomavirus (HPV). ? Walking barefoot can cause exposure to the germ, especially if your feet are wet. ? Warts happen when HPV attacks a break in the skin of the foot. What increases the risk?  Being between 50-66 years of age.  Using public showers or locker rooms.  Having a weakened body defense system (immune system). What are the signs or symptoms?   Flat or slightly raised growths that have a rough surface and look like a callus.  Pain when you use your foot to support your body weight. How is this treated? In many cases, warts do not need treatment. Without treatment, they often  go away with time. If treatment is needed or wanted, options may include:  Applying medicated solutions, creams, or patches to the wart. These make the skin soft so that layers will slowly shed away.  Freezing the wart with liquid nitrogen (cryotherapy).  Burning the wart with: ? Laser treatment. ? An electrified probe (electrocautery).  Injecting a medicine (Candida antigen) into the wart to help the body's defense system fight off the wart.  Having surgery to remove the wart.  Putting duct tape over the top of the wart (occlusion). You will leave the tape in place for as long as told by your doctor. Then you will replace it with a new strip of tape. This is done until the wart goes away. Repeat treatment may be needed if you choose to remove warts. Warts sometimes go away and come back again. Follow these instructions at home: General instructions  Apply creams or solutions only as told by your doctor. Follow these steps if your doctor tells you to do so: ? Soak your foot in warm water. ? Remove the top layer of softened skin before you apply the medicine. You can use a pumice stone to remove the skin. ? After you apply the medicine, put a bandage over the area of the wart. ? Repeat the process every day or as told by your doctor.  Do not scratch or pick at a wart.  Wash your hands after you touch a wart.  If a wart hurts, try covering it with a bandage that has a hole in the middle.  Keep all follow-up visits as told by your doctor. This is important. How is this prevented?   Wear shoes and socks. Change your socks every day.  Keep your feet clean and dry.  Check your feet often.  Do not walk barefoot in: ? Shared locker rooms. ? Shower areas. ? Swimming pools.  Avoid direct contact with warts on other people. Contact a doctor if:  Your warts do not improve after treatment.  You have redness, swelling, or pain at the site of a wart.  You have bleeding from a  wart, and the bleeding does not stop when you put light pressure on the wart.  You have diabetes and you get a wart. Summary  Warts are small growths on the skin.  When warts happen on the bottom of the foot (sole), they are called plantar warts.  In many cases, warts do not need treatment.  Apply creams or solutions only as told by your doctor.  Do not scratch or pick at a wart. Wash your hands after you touch a wart. This information is not intended to replace advice given to you by your health care provider. Make sure you discuss any questions you have with your health care provider. Document Released: 02/23/2010 Document Revised: 08/19/2017 Document Reviewed: 04/18/2014 Elsevier Interactive Patient Education  2019 [place], MD [facility]

## 2018-02-13 NOTE — Patient Instructions (Signed)
-It was nice meeting you today!  -Lamisil cream OTC for ringworm.  -Compound W patches and saran wrap for plantar warts. Switch out every 2 days.  -Increase seroquel to 25 mg at bedtime.  -Tramadol 50 mg up to twice daily as needed for pain, but start out at bedtime only for the first week.   Plantar Warts Plantar warts are small growths on the bottom of the foot (sole). Warts are caused by a type of germ (virus). Most warts are not painful, and they usually do not cause problems. Sometimes, plantar warts can cause pain when you walk. Warts often go away on their own in time. They can also spread to other areas of the body. Treatments may be done if needed. What are the causes?  Plantar warts are caused by a germ that is called human papillomavirus (HPV). ? Walking barefoot can cause exposure to the germ, especially if your feet are wet. ? Warts happen when HPV attacks a break in the skin of the foot. What increases the risk?  Being between 85-6 years of age.  Using public showers or locker rooms.  Having a weakened body defense system (immune system). What are the signs or symptoms?   Flat or slightly raised growths that have a rough surface and look like a callus.  Pain when you use your foot to support your body weight. How is this treated? In many cases, warts do not need treatment. Without treatment, they often go away with time. If treatment is needed or wanted, options may include:  Applying medicated solutions, creams, or patches to the wart. These make the skin soft so that layers will slowly shed away.  Freezing the wart with liquid nitrogen (cryotherapy).  Burning the wart with: ? Laser treatment. ? An electrified probe (electrocautery).  Injecting a medicine (Candida antigen) into the wart to help the body's defense system fight off the wart.  Having surgery to remove the wart.  Putting duct tape over the top of the wart (occlusion). You will leave the tape  in place for as long as told by your doctor. Then you will replace it with a new strip of tape. This is done until the wart goes away. Repeat treatment may be needed if you choose to remove warts. Warts sometimes go away and come back again. Follow these instructions at home: General instructions  Apply creams or solutions only as told by your doctor. Follow these steps if your doctor tells you to do so: ? Soak your foot in warm water. ? Remove the top layer of softened skin before you apply the medicine. You can use a pumice stone to remove the skin. ? After you apply the medicine, put a bandage over the area of the wart. ? Repeat the process every day or as told by your doctor.  Do not scratch or pick at a wart.  Wash your hands after you touch a wart.  If a wart hurts, try covering it with a bandage that has a hole in the middle.  Keep all follow-up visits as told by your doctor. This is important. How is this prevented?   Wear shoes and socks. Change your socks every day.  Keep your feet clean and dry.  Check your feet often.  Do not walk barefoot in: ? Shared locker rooms. ? Shower areas. ? Swimming pools.  Avoid direct contact with warts on other people. Contact a doctor if:  Your warts do not improve after treatment.  You have redness, swelling, or pain at the site of a wart.  You have bleeding from a wart, and the bleeding does not stop when you put light pressure on the wart.  You have diabetes and you get a wart. Summary  Warts are small growths on the skin.  When warts happen on the bottom of the foot (sole), they are called plantar warts.  In many cases, warts do not need treatment.  Apply creams or solutions only as told by your doctor.  Do not scratch or pick at a wart. Wash your hands after you touch a wart. This information is not intended to replace advice given to you by your health care provider. Make sure you discuss any questions you have with  your health care provider. Document Released: 02/23/2010 Document Revised: 08/19/2017 Document Reviewed: 04/18/2014 Elsevier Interactive Patient Education  2019 [employer].

## 2018-02-13 NOTE — Telephone Encounter (Signed)
Transition Care Management Follow-up Telephone Call   Date discharged?   How have you been since you were released from the hospital? Left upper abd pain and left leg pain   Do you understand why you were in the hospital? yes   Do you understand the discharge instructions? yes   Where were you discharged to? home   Items Reviewed:  Medications reviewed: yes  Allergies reviewed: yes  Dietary changes reviewed: yes  Referrals reviewed: yes   Functional Questionnaire:   Activities of Daily Living (ADLs):   She states they are independent in the following: ambulation, bathing and hygiene, feeding, continence, grooming, toileting and dressing States they require assistance with the following: ambulation, bathing and hygiene, feeding, continence, grooming, toileting and dressing   Any transportation issues/concerns?: no   Any patient concerns? no   Confirmed importance and date/time of follow-up visits scheduled yes  Provider Appointment booked with Dr Jerilee Hoh 02/13/18  Confirmed with patient if condition begins to worsen call PCP or go to the ER.  Patient was given the office number and encouraged to call back with question or concerns.  : yes

## 2018-02-13 NOTE — Telephone Encounter (Signed)
I would just double check with the daughter before sending in the PT orders. I think they are going to want to transition more towards Hospice care, in which case I believe she would not qualify for therapy.

## 2018-02-17 ENCOUNTER — Ambulatory Visit (INDEPENDENT_AMBULATORY_CARE_PROVIDER_SITE_OTHER): Payer: Self-pay

## 2018-02-17 ENCOUNTER — Ambulatory Visit (INDEPENDENT_AMBULATORY_CARE_PROVIDER_SITE_OTHER): Payer: Medicare Other | Admitting: Physical Medicine and Rehabilitation

## 2018-02-17 ENCOUNTER — Encounter (INDEPENDENT_AMBULATORY_CARE_PROVIDER_SITE_OTHER): Payer: Self-pay | Admitting: Physical Medicine and Rehabilitation

## 2018-02-17 DIAGNOSIS — M25552 Pain in left hip: Secondary | ICD-10-CM

## 2018-02-17 NOTE — Telephone Encounter (Signed)
ok 

## 2018-02-17 NOTE — Telephone Encounter (Signed)
Orders placed.

## 2018-02-17 NOTE — Telephone Encounter (Signed)
Left detailed message on machine for daughter per DPR to return our call. CRM

## 2018-02-17 NOTE — Progress Notes (Signed)
 .  Numeric Pain Rating Scale and Functional Assessment Average Pain 8   In the last MONTH (on 0-10 scale) has pain interfered with the following?  1. General activity like being  able to carry out your everyday physical activities such as walking, climbing stairs, carrying groceries, or moving a chair?  Rating(10)   -Dye Allergies.  

## 2018-02-17 NOTE — Telephone Encounter (Signed)
Daughter states Palliative care recommends 1 month of PT to see if it helps pt. Pt. Is having her hip injected today as well. Palliative care will be returning to assess pt. At the end of the month, and they will go from there, per daughter.

## 2018-02-17 NOTE — Telephone Encounter (Addendum)
Pt's daughter called to ask for PT order to be updated and sent to [facility] for in-home PT since pt is not able to go to PT facility for visits.

## 2018-02-18 DIAGNOSIS — M25552 Pain in left hip: Secondary | ICD-10-CM

## 2018-02-18 MED ORDER — TRIAMCINOLONE ACETONIDE 40 MG/ML IJ SUSP
80.0000 mg | INTRAMUSCULAR | Status: AC | PRN
  Administered 2018-02-18: 80 mg via INTRA_ARTICULAR

## 2018-02-18 MED ORDER — BUPIVACAINE HCL 0.5 % IJ SOLN
3.0000 mL | INTRAMUSCULAR | Status: AC | PRN
  Administered 2018-02-18: 3 mL via INTRA_ARTICULAR

## 2018-02-18 NOTE — Addendum Note (Signed)
Addended by: Westley Hummer B on: 02/18/2018 10:05 AM   Modules accepted: Orders

## 2018-02-18 NOTE — Telephone Encounter (Signed)
[facility] called and states that they received a referral for PT and the patient requested In-Home PT, unable to travel to facility to do PT  Referral needs to be sent to [facility] for in-home PT since pt is not able to go to PT facility for visits.

## 2018-02-18 NOTE — Progress Notes (Signed)
   [name] - 85 y.o. female MRN [id number]  Date of birth: 12/22/1922  Office Visit Note: Visit Date: 02/17/2018 PCP: Isaac Bliss, Rayford Halsted, MD Referred by: Isaac Bliss, Estel*  Subjective: Chief Complaint  Patient presents with  . Left Hip - Pain   HPI:  [name] is a 85 y.o. female who comes in today At the request of Dr. Rodell Perna for diagnostic and hopefully therapeutic anesthetic hip arthrogram on the left.  Patient has had prior right acetabulum and pelvic fracture that he is treated and this is healed.  She has decreased mobility from prior stroke and deconditioning.  1 of the history is obtained by her daughter.  Patient was able to be consented and did want to proceed with the injection.  She is having some left hip pain particularly with motion.  ROS Otherwise per HPI.  Assessment & Plan: Visit Diagnoses:  1. Pain in left hip     Plan: Findings:  Diagnostic and therapeutic left intra-articular hip anesthetic arthrogram.  Patient seemed to have increased range of motion on exam after anesthetic.  Very poor historian.    Meds & Orders: No orders of the defined types were placed in this encounter.   Orders Placed This Encounter  Procedures  . Large Joint Inj  . XR C-ARM NO REPORT    Follow-up: No follow-ups on file.   Procedures: Large Joint Inj: L hip joint on 02/18/2018 5:45 AM Indications: pain and diagnostic evaluation Details: 22 G needle, anterior approach  Arthrogram: Yes  Medications: 80 mg triamcinolone acetonide 40 MG/ML; 3 mL bupivacaine 0.5 % Outcome: tolerated well, no immediate complications  Arthrogram demonstrated excellent flow of contrast throughout the joint surface without extravasation or obvious defect.  The patient had increased range of motion physical exam during the anesthetic phase of the injection.  Procedure, treatment alternatives, risks and benefits explained, specific risks discussed. Consent was given by the  patient. Immediately prior to procedure a time out was called to verify the correct patient, procedure, equipment, support staff and site/side marked as required. Patient was prepped and draped in the usual sterile fashion.      No notes on file   Clinical History: No specialty comments available.     Objective:  VS:  HT:    WT:   BMI:     BP:   HR: bpm  TEMP: ( )  RESP:  Physical Exam  Ortho Exam Imaging: Xr C-arm No Report  Result Date: 02/17/2018 Please see Notes tab for imaging impression.

## 2018-02-18 NOTE — Telephone Encounter (Addendum)
Spoke with daughter and patient is not eating well and is in a lot of pain.  She has tried to call the palliative care nurse with no response.  Daughter would like to know if it is safe to double up on the Tramadol?   Left message on machine for Palliative Care to see if they can help.

## 2018-02-18 NOTE — Telephone Encounter (Signed)
Left detailed message on machine for [name] with verbal orders.

## 2018-02-18 NOTE — Telephone Encounter (Signed)
[name] PT with [facility]   [name] states pt had been receiving PT at home and the requested order below was to extend PT. Please call [name] to advise. [phone] secure VM if not available    Copied from [place] [id number]. Topic: Quick Communication - Home Health Verbal Orders >> Feb 10, 2018 11:28 AM Yvette Rack wrote: Caller/Agency: [name] with [facility] Number: [phone] Requesting OT/PT/Skilled Nursing/Social Work: PT  Frequency: 1 time a week for 4 weeks

## 2018-02-19 ENCOUNTER — Telehealth: Payer: Self-pay | Admitting: Internal Medicine

## 2018-02-19 NOTE — Telephone Encounter (Signed)
I returned call to daughter [name] who expressed her concerns about [name]'s continued decline.  She reports that patient only eats bites and sips sporadically now and did not eat for 2 days.  She has been refusing to drink adequate amounts of liquids even her favorite tea.  She ate 1-2 tablespoonfuls of her meal this am.  Reporting that she is more awake during night time hours and complains of a headache.  She is having difficulty getting pt to accept her medications whether whole or crushed.  I encouraged her to offer liquid Tylenol 500mg (home supply) PO for her complaints of headache which may be related to dehydration and with her hx of a meningioma and a new lung mass.  Daughter stated she will attempt this action. Daughter states that she is ready for her mother to have comfort care only, disregard attempts for physical therapy as she has become much weaker since the initial Palliative visit and she is unable to ambulate now.  She has to pivot to bedside commode or prefers to use her brief for elimination.  She is unable to feed herself now as well. Daughter is in agreement with transition to Hospice care at home for support of her mother.  I informed her that I would contact Dr. Cresenciano Lick office on 02/19/18 to begin this referral process.  She thanked me for the advice and immediate plans.

## 2018-02-19 NOTE — Telephone Encounter (Signed)
Copied from [place] [phone]. Topic: Quick Communication - See Telephone Encounter >> Feb 19, 2018 10:20 AM Rutherford Nail, NT wrote: CRM for notification. See Telephone encounter for: 02/19/18. Shirley with [facility] calling and states that she is needing a verbal order for a Hospice referral. States that she will be forwarding the telephone conversation that she had with the patient's daughter yesterday to Dr Jerilee Hoh. Please advise.  CB#: [phone]

## 2018-02-20 ENCOUNTER — Telehealth: Payer: Self-pay | Admitting: Internal Medicine

## 2018-02-20 NOTE — Telephone Encounter (Signed)
Yes

## 2018-02-20 NOTE — Telephone Encounter (Signed)
Copied from [place] [phone]. Topic: Quick Communication - See Telephone Encounter >> Feb 20, 2018  9:36 AM Burchel, Abbi R wrote: CRM for notification. See Telephone encounter for: 02/20/18.  [facility] ([place] [facility] [place]: [phone]) called to request permission to list Dr Isaac Bliss as pt's attending physician.  Please advise.

## 2018-02-23 NOTE — Telephone Encounter (Signed)
jen is aware dr Jerilee Hoh will be attending physician

## 2018-03-18 ENCOUNTER — Other Ambulatory Visit: Payer: Self-pay | Admitting: Internal Medicine

## 2018-03-24 ENCOUNTER — Ambulatory Visit (INDEPENDENT_AMBULATORY_CARE_PROVIDER_SITE_OTHER): Payer: Medicare Other | Admitting: Orthopaedic Surgery

## 2018-03-25 ENCOUNTER — Ambulatory Visit (INDEPENDENT_AMBULATORY_CARE_PROVIDER_SITE_OTHER): Payer: Medicare Other | Admitting: Orthopaedic Surgery

## 2018-03-25 ENCOUNTER — Encounter (INDEPENDENT_AMBULATORY_CARE_PROVIDER_SITE_OTHER): Payer: Self-pay | Admitting: Orthopaedic Surgery

## 2018-03-25 VITALS — BP 135/69 | HR 77 | Ht 64.0 in | Wt 111.0 lb

## 2018-03-25 DIAGNOSIS — S32434S Nondisplaced fracture of anterior column [iliopubic] of right acetabulum, sequela: Secondary | ICD-10-CM

## 2018-03-25 NOTE — Progress Notes (Signed)
Office Visit Note   Patient: [name]           Date of Birth: 10/12/1922           MRN: [id number] Visit Date: 03/25/2018              Requested by: Isaac Bliss, Rayford Halsted, MD [place], [place] [address] PCP: Isaac Bliss, Rayford Halsted, MD   Assessment & Plan: Visit Diagnoses:  1. Closed nondisplaced fracture of anterior column of right acetabulum, sequela     Plan: She can continue to work on lower extremity strengthening try to stand as long as she is able continue to try to walk some with her walker on a daily basis.  Tylenol is being used as well as tramadol.  Dr. Isaac Bliss placed her on morphine solution.  Currently her pelvic fracture is healed she does not have symptomatic hip osteoarthritis on the left.  Follow-Up Instructions: Return if symptoms worsen or fail to improve.   Orders:  No orders of the defined types were placed in this encounter.  No orders of the defined types were placed in this encounter.     Procedures: No procedures performed   Clinical Data: No additional findings.   Subjective: Chief Complaint  Patient presents with  . Left Hip - Pain, Follow-up    HPI 85 year old female returns for 6-week follow-up.  She had hip injection which helped some with groin pain but really did not allow her to ambulate better.  She had total up arthroplasty on the right with cemented stem and has not been able to ambulate and can only transfer with 1+ assistance since the fall with central acetabular fracture.  She tends to keep her right knee and valgus with internal rotation.  Anterior tib gastrocsoleus is active but she has bilateral lower extremity weakness.  Follow-up x-rays in December showed her fracture and healed.  Central acetabular fracture and rami fractures were all on the right.  She does have hip osteoarthritis on the left.  Review of Systems  Of note is positive Hx for  previous meningioma right side with left  body weakness.  Otherwise unchanged from 02/03/2018 visit.   Objective: Vital Signs: BP 135/69   Pulse 77   Ht 5\' 4"  (1.626 m)   Wt 111 lb (50.3 kg)   BMI 19.05 kg/m   Physical Exam Constitutional:      Appearance: She is well-developed.  HENT:     Head: Normocephalic.     Right Ear: External ear normal.     Left Ear: External ear normal.  Eyes:     Pupils: Pupils are equal, round, and reactive to light.  Neck:     Thyroid: No thyromegaly.     Trachea: No tracheal deviation.  Cardiovascular:     Rate and Rhythm: Normal rate.  Pulmonary:     Effort: Pulmonary effort is normal.  Abdominal:     Palpations: Abdomen is soft.  Skin:    General: Skin is warm and dry.  Neurological:     Mental Status: She is alert and oriented to person, place, and time.  Psychiatric:        Behavior: Behavior normal.     Ortho Exam family has stand her up holder underneath her arms.  She has bent knees internal rotation lower extremity on the right.  No pain with internal or external rotation left hip in sitting position.  Just some quad weakness calf weakness bilaterally.  Specialty Comments:  No specialty comments available.  Imaging: No results found.   PMFS History: Patient Active Problem List   Diagnosis Date Noted  . Normochromic normocytic anemia 02/07/2018  . Pressure injury of skin 02/07/2018  . Elevated troponin 02/06/2018  . Unilateral primary osteoarthritis, left hip 02/03/2018  . Community acquired pneumonia of right upper lobe of lung ([place]) 01/26/2018  . Congestive heart failure (CHF) ([place]) 01/26/2018  . Vitamin D deficiency 01/25/2018  . Acetabular fracture ([place]) 12/19/2017  . Pelvic fracture ([place]) 12/13/2017  . Protein-calorie malnutrition, severe 06/13/2017  . Fall 06/12/2017  . Headache 09/25/2016  . History of stroke 11/15/2014  . History of meningioma 11/15/2014  . Chest pain 08/13/2013  . GERD (gastroesophageal reflux disease) 11/19/2011  . Dysphagia  11/18/2011  . Palpitations 11/17/2011  . Encounter for long-term (current) use of other medications 10/29/2011  . Hyperthyroidism 10/14/2011  . Vertigo 10/14/2011  . Bradycardia   . Subarachnoid hemorrhage following injury ([place])   . Hyperlipidemia 03/05/2011  . Takotsubo syndrome 04/22/2008  . Hemiplegia, late effect of cerebrovascular disease ([place]) 07/22/2007  . THYROID NODULE 01/14/2007  . Pulmonary nodule 01/14/2007  . MENINGIOMA 08/27/2006  . Essential hypertension 07/15/2006  . Osteoarthritis 07/15/2006   Past Medical History:  Diagnosis Date  . Abnormal CT of the chest    Stable patchy/nodular and ground-glass opacities in the right upper lobe, While grossly unchanged, low grade adenocarcinoma remains possible.  [facility] ANEMIA, PERNICIOUS 09/10/2006  . Bradycardia    August, 2013  . CORONARY ARTERY DISEASE 11/07/2008   a. NSTEMI 2001 & STEMI 2008 managed medically. b. Takotsubo 06/2006. c. Cath 2009 -> med rx.  . CVA WITH LEFT HEMIPARESIS 07/22/2007   May, 2009, neurology decided aspirin and Plavix at that time.  . DERMATITIS 03/02/2007  . DJD (degenerative joint disease)    L shoulder impingement  . Ejection fraction    EF 60%, echo, July, 2010, (  EF improved after tach a suitable event 2008)  . Family history of anesthesia complication    daughter has a hard time waking up  . H/O hiatal hernia   . H/O major orthopedic surgery    rotator cuff R  . HYPERTENSION 07/15/2006  . IBS (irritable bowel syndrome)   . MENINGIOMA 08/27/2006   Occipital meningioma, surgery, [facility], 2008 /  MRI, [facility], June, 2011, no change in lesion, history believe the patient had a gamma knife treatment of a right occipital atypical meningioma  . Myocardial infarction ([place])   . OSTEOARTHRITIS 07/15/2006  . Pelvic fracture ([place])   . PULMONARY NODULE 01/14/2007   [place], December, 2011  . Stroke ([place])   . Subarachnoid hemorrhage following injury ([place]) 2007   Subarachnoid hemorrhage secondary to a fall  December, 2007  . Takotsubo syndrome 04/22/2008   MI, May, 2008, question takotsubo event  . THYROID NODULE 01/14/2007    Family History  Problem Relation Age of Onset  . Heart attack Brother 58  . Stroke Brother   . Diabetes Brother   . Stroke Sister     Past Surgical History:  Procedure Laterality Date  . ABDOMINAL HYSTERECTOMY    . BRAIN TUMOR EXCISION     Meningioma; [facility] at [facility] in 2008  . CHOLECYSTECTOMY    . JOINT REPLACEMENT     rt THR  . SHOULDER SURGERY     Social History   Occupational History    Employer: RETIRED  Tobacco Use  . Smoking status: Never Smoker  . Smokeless  tobacco: Never Used  Substance and Sexual Activity  . Alcohol use: No    Alcohol/week: 0.0 standard drinks  . Drug use: No  . Sexual activity: Not Currently    Birth control/protection: Surgical

## 2018-03-30 ENCOUNTER — Other Ambulatory Visit: Payer: Self-pay | Admitting: Internal Medicine

## 2018-03-31 ENCOUNTER — Other Ambulatory Visit: Payer: Self-pay | Admitting: *Deleted

## 2018-03-31 MED ORDER — VITAMIN D (ERGOCALCIFEROL) 1.25 MG (50000 UNIT) PO CAPS: 50000.0000 [IU] | ORAL_CAPSULE | ORAL | 0 refills | Status: AC

## 2018-04-13 ENCOUNTER — Other Ambulatory Visit: Payer: Self-pay | Admitting: Internal Medicine

## 2018-04-13 MED ORDER — MORPHINE SULFATE (CONCENTRATE) 10 MG /0.5 ML PO SOLN: 5.0000 mg | ORAL | 0 refills | Status: AC | PRN

## 2018-04-13 MED ORDER — MORPHINE SULFATE (CONCENTRATE) 10 MG/0.5ML PO SOLN: 5.0000 mg | Freq: Every day | ORAL | Status: AC

## 2018-04-14 ENCOUNTER — Non-Acute Institutional Stay (SKILLED_NURSING_FACILITY): Admitting: Internal Medicine

## 2018-04-14 ENCOUNTER — Other Ambulatory Visit: Payer: Self-pay

## 2018-04-14 ENCOUNTER — Encounter: Payer: Self-pay | Admitting: Internal Medicine

## 2018-04-14 DIAGNOSIS — Z8673 Personal history of transient ischemic attack (TIA), and cerebral infarction without residual deficits: Secondary | ICD-10-CM | POA: Diagnosis not present

## 2018-04-14 DIAGNOSIS — E041 Nontoxic single thyroid nodule: Secondary | ICD-10-CM | POA: Diagnosis not present

## 2018-04-14 DIAGNOSIS — G301 Alzheimer's disease with late onset: Secondary | ICD-10-CM

## 2018-04-14 DIAGNOSIS — E538 Deficiency of other specified B group vitamins: Secondary | ICD-10-CM

## 2018-04-14 DIAGNOSIS — I25118 Atherosclerotic heart disease of native coronary artery with other forms of angina pectoris: Secondary | ICD-10-CM

## 2018-04-14 DIAGNOSIS — E0590 Thyrotoxicosis, unspecified without thyrotoxic crisis or storm: Secondary | ICD-10-CM

## 2018-04-14 DIAGNOSIS — E559 Vitamin D deficiency, unspecified: Secondary | ICD-10-CM

## 2018-04-14 DIAGNOSIS — E785 Hyperlipidemia, unspecified: Secondary | ICD-10-CM

## 2018-04-14 DIAGNOSIS — F0281 Dementia in other diseases classified elsewhere with behavioral disturbance: Secondary | ICD-10-CM

## 2018-04-14 NOTE — Progress Notes (Signed)
:  Location:  [facility] Room Number: [phone] Place of Service:  SNF (31)  [name]. Sarah Coil, MD  Patient Care Team: Isaac Bliss, Rayford Halsted, MD as PCP - General (Internal Medicine) Dory Horn, MD as Rounding Team (Internal Medicine) Jacolyn Reedy, MD as Consulting Physician (Cardiology) Gonzella Lex, NP as Nurse Practitioner [facility])  Extended Emergency Contact Information Primary Emergency Contact: Warnell Bureau Address: [address]           [place], [place] [address] [name] of [place] Phone: [phone] Work Phone: [phone] Mobile Phone: [phone] Relation: Daughter     Allergies: Cinnamon; Atorvastatin; Fluorometholone; Influenza vaccines; Phenytoin; Promethazine hcl; and Sulfamethoxazole  Chief Complaint  Patient presents with  . New Admit To SNF    Admit to [facility]    HPI: Patient is 85 y.o. female with right upper lobe lesion concerning for malignancy, family not wishing to pursue further work-up, hyperlipidemia, hypothyroidism, history of stroke, history of atrial fib, history of meningioma removed by gamma knife, history of pelvic fracture, history of Takotsubo cardiomyopathy, who is a hospice patient secondary to dementia, new lung mass and recurrent meningioma.  Patient is admitted to skilled nursing facility for a 5-day respite stay.  Patient has no pains or complaints.  While at skilled nursing facility patient will be followed for history of CVA treated with Plavix, history of thyroid nodule treated with Tapazole, and dementia with behaviors treated with Seroquel.  Past Medical History:  Diagnosis Date  . Abnormal CT of the chest    Stable patchy/nodular and ground-glass opacities in the right upper lobe, While grossly unchanged, low grade adenocarcinoma remains possible.  Marland Kitchen ANEMIA, PERNICIOUS 09/10/2006  . Bradycardia    August, 2013  . CORONARY ARTERY DISEASE  11/07/2008   a. NSTEMI 2001 & STEMI 2008 managed medically. b. Takotsubo 06/2006. c. Cath 2009 -> med rx.  . CVA WITH LEFT HEMIPARESIS 07/22/2007   May, 2009, neurology decided aspirin and Plavix at that time.  . DERMATITIS 03/02/2007  . DJD (degenerative joint disease)    L shoulder impingement  . Ejection fraction    EF 60%, echo, July, 2010, (  EF improved after tach a suitable event 2008)  . Family history of anesthesia complication    daughter has a hard time waking up  . H/O hiatal hernia   . H/O major orthopedic surgery    rotator cuff R  . HYPERTENSION 07/15/2006  . IBS (irritable bowel syndrome)   . MENINGIOMA 08/27/2006   Occipital meningioma, surgery, [facility], 2008 /  MRI, [place], June, 2011, no change in lesion, history believe the patient had a gamma knife treatment of a right occipital atypical meningioma  . Myocardial infarction ([place])   . OSTEOARTHRITIS 07/15/2006  . Pelvic fracture ([place])   . PULMONARY NODULE 01/14/2007   [place], December, 2011  . Stroke ([place])   . Subarachnoid hemorrhage following injury ([place]) 2007   Subarachnoid hemorrhage secondary to a fall December, 2007  . Takotsubo syndrome 04/22/2008   MI, May, 2008, question takotsubo event  . THYROID NODULE 01/14/2007    Past Surgical History:  Procedure Laterality Date  . ABDOMINAL HYSTERECTOMY    . BRAIN TUMOR EXCISION     Meningioma; treated at [facility] [place] in 2008  . CHOLECYSTECTOMY    . JOINT REPLACEMENT     rt THR  . SHOULDER SURGERY      Allergies as of 04/14/2018      Reactions  Cinnamon Anaphylaxis, Swelling   Throat swells   Atorvastatin Other (See Comments)   Raises liver enzymes (takes pravastatin at home)   Fluorometholone Other (See Comments)   Causes the patient to "fall down"   Influenza Vaccines Other (See Comments)   "Dizziness"   Phenytoin Other (See Comments)   Blood clots   Promethazine Hcl Other (See Comments)   Severe sedation   Sulfamethoxazole Rash      Medication List        Accurate as of April 14, 2018 10:44 AM. Always use your most recent med list.        acetaminophen 500 MG tablet Commonly known as:  TYLENOL Take 500 mg by mouth every 6 (six) hours as needed (for pain or headaches).   bisacodyl 10 MG suppository Commonly known as:  DULCOLAX Place 1 suppository (10 mg total) rectally daily as needed for moderate constipation.   clopidogrel 75 MG tablet Commonly known as:  PLAVIX Take 1 tablet (75 mg total) by mouth daily with breakfast.   docusate sodium 100 MG capsule Commonly known as:  COLACE Take 1 capsule (100 mg total) by mouth 2 (two) times daily.   feeding supplement (ENSURE ENLIVE) Liqd Take 237 mLs by mouth 2 (two) times daily between meals.   methimazole 5 MG tablet Commonly known as:  TAPAZOLE TAKE 1 TABLET (5 MG TOTALLY) THREE TIMES A WEEK, MON, WED, AND FRI   morphine CONCENTRATE 10 mg / 0.5 ml concentrated solution Take 0.25 mLs (5 mg total) by mouth every 4 (four) hours as needed for moderate pain, severe pain, anxiety or shortness of breath.   nitroGLYCERIN 0.4 MG SL tablet Commonly known as:  NITROSTAT Place 1 tablet (0.4 mg total) under the tongue every 5 (five) minutes as needed. For chest pain.   pravastatin 20 MG tablet Commonly known as:  PRAVACHOL Take 1 tablet (20 mg total) by mouth every evening.   QUEtiapine 25 MG tablet Commonly known as:  SEROquel Take 1 tablet (25 mg total) by mouth at bedtime.   senna 8.6 MG Tabs tablet Commonly known as:  SENOKOT Take 1 tablet (8.6 mg total) by mouth at bedtime as needed for mild constipation.   traMADol 50 MG tablet Commonly known as:  ULTRAM Take 1 tablet (50 mg total) by mouth every 12 (twelve) hours as needed for severe pain.   Vitamin D (Ergocalciferol) 1.25 MG (50000 UT) Caps capsule Commonly known as:  DRISDOL Take 1 capsule (50,000 Units total) by mouth every 7 (seven) days.       No orders of the defined types were placed in this  encounter.   Immunization History  Administered Date(s) Administered  . Pneumococcal Conjugate-13 04/18/2014  . Pneumococcal Polysaccharide-23 10/19/2012  . Tetanus 10/19/2012    Social History   Tobacco Use  . Smoking status: Never Smoker  . Smokeless tobacco: Never Used  Substance Use Topics  . Alcohol use: No    Alcohol/week: 0.0 standard drinks    Family history is   Family History  Problem Relation Age of Onset  . Heart attack Brother 32  . Stroke Brother   . Diabetes Brother   . Stroke Sister       Review of Systems  DATA OBTAINED: from patient, nurse GENERAL:  no fevers, fatigue, appetite changes SKIN: No itching, or rash EYES: No eye pain, redness, discharge EARS: No earache, tinnitus, change in hearing NOSE: No congestion, drainage or bleeding  MOUTH/THROAT: No mouth or tooth pain,  No sore throat RESPIRATORY: No cough, wheezing, SOB CARDIAC: No chest pain, palpitations, lower extremity edema  GI: No abdominal pain, No N/V/D or constipation, No heartburn or reflux  GU: No dysuria, frequency or urgency, or incontinence  MUSCULOSKELETAL: No unrelieved bone/joint pain NEUROLOGIC: No headache, dizziness or focal weakness PSYCHIATRIC: No c/o anxiety or sadness   Vitals:   04/14/18 1043  BP: (!) 145/78  Pulse: 78  Resp: 20  Temp: 98.2 F (36.8 C)    SpO2 Readings from Last 1 Encounters:  02/13/18 96%   Body mass index is 19.05 kg/m.     Physical Exam  GENERAL APPEARANCE: Alert, conversant,  No acute distress.,  Up and walking around her room SKIN: No diaphoresis rash HEAD: Normocephalic, atraumatic  EYES: Conjunctiva/lids clear. Pupils round, reactive. EOMs intact.  EARS: External exam WNL, canals clear. Hearing grossly normal.  NOSE: No deformity or discharge.  MOUTH/THROAT: Lips w/o lesions  RESPIRATORY: Breathing is even, unlabored. Lung sounds are clear   CARDIOVASCULAR: Heart RRR no murmurs, rubs or gallops. No peripheral edema.    GASTROINTESTINAL: Abdomen is soft, non-tender, not distended w/ normal bowel sounds. GENITOURINARY: Bladder non tender, not distended  MUSCULOSKELETAL: No abnormal joints or musculature NEUROLOGIC:  Cranial nerves 2-12 grossly intact. Moves all extremities  PSYCHIATRIC: Mood and affect dementia, no behavioral issues  Patient Active Problem List   Diagnosis Date Noted  . Normochromic normocytic anemia 02/07/2018  . Pressure injury of skin 02/07/2018  . Elevated troponin 02/06/2018  . Unilateral primary osteoarthritis, left hip 02/03/2018  . Community acquired pneumonia of right upper lobe of lung ([place]) 01/26/2018  . Congestive heart failure (CHF) ([place]) 01/26/2018  . Vitamin D deficiency 01/25/2018  . Acetabular fracture ([place]) 12/19/2017  . Pelvic fracture ([place]) 12/13/2017  . Protein-calorie malnutrition, severe 06/13/2017  . Fall 06/12/2017  . Headache 09/25/2016  . History of stroke 11/15/2014  . History of meningioma 11/15/2014  . Chest pain 08/13/2013  . GERD (gastroesophageal reflux disease) 11/19/2011  . Dysphagia 11/18/2011  . Palpitations 11/17/2011  . Encounter for long-term (current) use of other medications 10/29/2011  . Hyperthyroidism 10/14/2011  . Vertigo 10/14/2011  . Bradycardia   . Subarachnoid hemorrhage following injury ([place])   . Hyperlipidemia 03/05/2011  . Takotsubo syndrome 04/22/2008  . Hemiplegia, late effect of cerebrovascular disease ([place]) 07/22/2007  . THYROID NODULE 01/14/2007  . Pulmonary nodule 01/14/2007  . MENINGIOMA 08/27/2006  . Essential hypertension 07/15/2006  . Osteoarthritis 07/15/2006      Labs reviewed: Basic Metabolic Panel:    Component Value Date/Time   NA 134 (L) 02/08/2018 0314   NA 137 01/20/2018   K 3.8 02/08/2018 0314   CL 100 02/08/2018 0314   CO2 25 02/08/2018 0314   GLUCOSE 100 (H) 02/08/2018 0314   BUN 9 02/08/2018 0314   BUN 12 01/20/2018   CREATININE 0.67 02/08/2018 0314   CALCIUM 8.8 (L) 02/08/2018 0314    PROT 6.0 (L) 02/08/2018 0314   ALBUMIN 3.1 (L) 02/08/2018 0314   AST 18 02/08/2018 0314   ALT 9 02/08/2018 0314   ALKPHOS 72 02/08/2018 0314   BILITOT 0.8 02/08/2018 0314   GFRNONAA >60 02/08/2018 0314   GFRAA >60 02/08/2018 0314    Recent Labs    06/14/17 0357 12/09/17 1406  02/06/18 1650 02/07/18 0634 02/08/18 0314  NA 139 138   < > 134* 137 134*  K 3.6 4.1   < > 3.7 3.4* 3.8  CL 104 104   < > 101  102 100  CO2 27 24   < > 26 27 25   GLUCOSE 104* 162*   < > 133* 90 100*  BUN 10 33*   < > 14 9 9   CREATININE 0.84 0.86   < > 0.54 0.75 0.67  CALCIUM 8.5* 9.0   < > 8.7* 8.7* 8.8*  MG 1.8 1.9  --   --  1.6*  --    < > = values in this interval not displayed.   Liver Function Tests: Recent Labs    02/06/18 1650 02/07/18 0634 02/08/18 0314  AST 20 18 18   ALT 9 9 9   ALKPHOS 80 70 72  BILITOT 0.4 0.2* 0.8  PROT 6.3* 6.1* 6.0*  ALBUMIN 3.4* 3.0* 3.1*   Recent Labs    06/12/17 1954  LIPASE 35   No results for input(s): AMMONIA in the last 8760 hours. CBC: Recent Labs    01/19/18 1543  02/06/18 1650 02/07/18 0634 02/08/18 0314  WBC 9.5   < > 11.4* 7.6 6.7  NEUTROABS 6.2  --   --  3.9 3.6  HGB 10.7*   < > 11.0* 10.7* 11.1*  HCT 32.4*   < > 35.2* 35.0* 35.4*  MCV 93.4  --  94.1 93.1 91.5  PLT 371.0   < > 257 227 256   < > = values in this interval not displayed.   Lipid No results for input(s): CHOL, HDL, LDLCALC, TRIG in the last 8760 hours.  Cardiac Enzymes: Recent Labs    02/06/18 1917 02/07/18 0102 02/07/18 0634  TROPONINI 0.06* 0.05* 0.04*   BNP: Recent Labs    02/07/18 0634  BNP 1,000.3*   No results found for: [facility] Lab Results  Component Value Date   HGBA1C 5.9 (H) 03/05/2011   Lab Results  Component Value Date   TSH 0.810 02/07/2018   Lab Results  Component Value Date   ASNKNLZJ67 3419 (H) 12/09/2007   Lab Results  Component Value Date   FOLATE  12/09/2007    >20.0 (NOTE)  Reference Ranges        Deficient:       0.4 - 3.3  ng/mL        Indeterminate:   3.4 - 5.4 ng/mL        Normal:              > 5.4 ng/mL   Lab Results  Component Value Date   IRON 80 08/16/2014   TIBC 421 12/09/2007   FERRITIN 8 (L) 12/09/2007    Imaging and Procedures obtained prior to SNF admission: Dg Chest 2 View  Result Date: 02/06/2018 CLINICAL DATA:  Cough congestion fever EXAM: CHEST - 2 VIEW COMPARISON:  12/05/2017, 06/12/2017, chest CT 01/30/2013 FINDINGS: Small left-sided pleural effusion with lingular and basilar airspace disease. Cardiomegaly with aortic atherosclerosis. Irregular opacity in the right upper lobe, likely corresponding to focal scarring on comparison chest CT from 2014. No pneumothorax. IMPRESSION: 1. Small left-sided pleural effusion with lingular and left basilar atelectasis or pneumonia 2. Cardiomegaly Electronically Signed   By: Donavan Foil M.D.   On: 02/06/2018 17:28   Ct Head Wo Contrast  Result Date: 02/06/2018 CLINICAL DATA:  Focal neuro deficit, > 6 hrs, stroke suspected. 85 year old with sudden onset of vomiting. History of meningioma post resection. EXAM: CT HEAD WITHOUT CONTRAST TECHNIQUE: Contiguous axial images were obtained from the base of the skull through the vertex without intravenous contrast. COMPARISON:  Head CT 06/12/2017, brain MRI 06/14/2017 FINDINGS:  Brain: Progressive decreased density in the posterior right parietal lobe, superior to known meningioma, lesion better defined on MRI. Additional area of encephalomalacia in the right parietooccipital lobe adjacent to prior craniotomy is unchanged. No hemorrhage. No midline shift. No subdural collection. Chronic small vessel ischemia is otherwise similar to prior. No hydrocephalus. The basilar cisterns are patent. Vascular: No hyperdense vessel. Skull: Prior right parietooccipital craniotomy with bony resorption at the craniotomy defect related to known meningioma. Overall appearance is not significantly changed from May 2019 CT no new focal lesion.  Sinuses/Orbits: No acute findings. Other: None. IMPRESSION: 1. Increasing right parietal gliosis adjacent to known meningioma, lesion grossly similar in appearance to May 2019 CT. MRI provided greater detail of lesion evaluation. 2. No hemorrhage or other acute findings. Electronically Signed   By: Keith Rake M.D.   On: 02/06/2018 20:06   Ct Angio Chest Pe W Or Wo Contrast  Result Date: 02/06/2018 CLINICAL DATA:  Sudden onset of vomiting and body aches. History of pulmonary nodule. History of a brain tumor. EXAM: CT ANGIOGRAPHY CHEST WITH CONTRAST TECHNIQUE: Multidetector CT imaging of the chest was performed using the standard protocol during bolus administration of intravenous contrast. Multiplanar CT image reconstructions and MIPs were obtained to evaluate the vascular anatomy. CONTRAST:  146mL ISOVUE-370 IOPAMIDOL (ISOVUE-370) INJECTION 76% COMPARISON:  Chest radiograph, 02/06/2018.  Chest CTA, 01/30/2013. FINDINGS: Cardiovascular: There is satisfactory opacification of the pulmonary arteries to the segmental level. There is no evidence of a pulmonary embolism. Heart is mildly enlarged. Trace pericardial effusion. Two vessel coronary artery calcifications. Ascending aorta dilated to 4.2 cm. Aortic atherosclerosis. No dissection. Mediastinum/Nodes: Mild thyroid enlargement. Poorly defined thyroid nodules, largest in the left lobe measuring 1.8 cm. No neck base or axillary masses or adenopathy. No mediastinal or hilar masses or pathologically enlarged lymph nodes. Trachea is prominent, widely patent. Mild distention of the mid to upper esophagus. Esophagus otherwise unremarkable. Lungs/Pleura: Small left pleural effusion. There is dependent atelectasis in the left lower lobe. In the right upper lobe, there is bandlike opacity that is contiguous with a round opacity with irregular to spiculated margins. This latter round opacity is centered on image 28, series 5, measuring 15 mm. The contiguous opacity  extends to the posterosuperior pleural margins of the right upper lobe and joints mild apical pleuroparenchymal scarring. This opacity has increased when compared to the prior CT, particularly around component. There is minor dependent subsegmental atelectasis in the right lower lobe. Small calcified granuloma in the left upper lobe. 4 mm noncalcified nodule in the left upper lobe, image 38, series 5, stable from the prior exam, benign. Upper Abdomen: No acute finding. Musculoskeletal: No fracture or acute finding. No osteoblastic or osteolytic lesions. Vertebral hemangiomas noted in T6 and T12. Review of the MIP images confirms the above findings. IMPRESSION: 1. No evidence of a pulmonary embolism. 2. Opacity in the right upper lobe, increased compared the prior CT. This has a rounded, nodular component that measures 15 mm. This is suspicious for malignancy developing in the setting of scarring. Recommend follow-up PET-CT or tissue sampling. 3. Small left pleural effusion. Left lower lobe atelectasis. No convincing pneumonia. No pulmonary edema. 4. Cardiomegaly. 5. 4.2 cm ascending thoracic aortic aneurysm. Recommend annual imaging followup by CTA or MRA. This recommendation follows 2010 ACCF/AHA/AATS/ACR/ASA/SCA/SCAI/SIR/STS/SVM Guidelines for the Diagnosis and Management of Patients with Thoracic Aortic Disease. Circulation. 2010; 121: e266-e369 6. Two vessel coronary artery calcifications. 7. Left lobe thyroid nodule which may be increased from the prior CT.  Measures 1.8 cm. Consider follow-up ultrasound for further characterization. Aortic aneurysm NOS (ICD10-I71.9). Electronically Signed   By: Lajean Manes M.D.   On: 02/06/2018 20:09     Not all labs, radiology exams or other studies done during hospitalization come through on my EPIC note; however they are reviewed by me.    Assessment and Plan  Thyroid nodule/hyperthyroidism- continue Tapazole 5 mg 3 times a week  Dementia with  behaviors-specifically sundowning; continue Seroquel 50 mg nightly  History of CVA/coronary artery disease-consider Plavix 75 mg daily, nitroglycerin sublingual and statin  Hyperlipidemia- not stated as uncontrolled; continue Pravachol 20 mg daily  Vitamin D deficiency- continue replacement 50,000 units weekly  B12 deficiency- continue 1000 mcg IM monthly    Time spent greater than 35 minutes;> 50% of time with patient was spent reviewing records, labs, tests and studies, counseling and developing plan of care  Webb Silversmith D. Sarah Coil, MD

## 2018-04-17 ENCOUNTER — Encounter: Payer: Self-pay | Admitting: Internal Medicine

## 2018-04-17 DIAGNOSIS — E538 Deficiency of other specified B group vitamins: Secondary | ICD-10-CM | POA: Insufficient documentation

## 2018-04-17 DIAGNOSIS — Z8673 Personal history of transient ischemic attack (TIA), and cerebral infarction without residual deficits: Secondary | ICD-10-CM | POA: Insufficient documentation

## 2018-04-17 DIAGNOSIS — F0391 Unspecified dementia with behavioral disturbance: Secondary | ICD-10-CM | POA: Insufficient documentation

## 2018-04-17 DIAGNOSIS — F03918 Unspecified dementia, unspecified severity, with other behavioral disturbance: Secondary | ICD-10-CM | POA: Insufficient documentation

## 2018-04-17 DIAGNOSIS — I2510 Atherosclerotic heart disease of native coronary artery without angina pectoris: Secondary | ICD-10-CM | POA: Insufficient documentation

## 2018-04-28 ENCOUNTER — Ambulatory Visit: Payer: Medicare Other | Admitting: Endocrinology

## 2018-05-05 ENCOUNTER — Telehealth: Payer: Self-pay

## 2018-05-05 NOTE — Telephone Encounter (Signed)
Copied from [place] [id number]. Topic: General - Other >> May 05, 2018  2:31 PM Percell Belt A wrote: Reason for CRM: Hospic called in and left a vm on general mailbox--- patty stated that pt is up for re certification and would like to get the ok  Best number   [phone]

## 2018-05-05 NOTE — Telephone Encounter (Signed)
ok 

## 2018-05-06 NOTE — Telephone Encounter (Signed)
Verbal orders given  

## 2018-08-03 ENCOUNTER — Telehealth: Payer: Self-pay | Admitting: Internal Medicine

## 2018-08-03 NOTE — Telephone Encounter (Signed)
Vara Guardian from [facility] is requesting extended care for hospices. Needs verbal. Call back # [phone]

## 2018-08-04 NOTE — Telephone Encounter (Signed)
Verbal orders given to Patty to extend hospice care.

## 2018-08-04 NOTE — Telephone Encounter (Signed)
Yes, ok to continue hospice care

## 2018-09-05 DEATH — deceased

## 2020-02-23 IMAGING — DX DG HIP (WITH OR WITHOUT PELVIS) 2-3V*L*
4 series · 4 of 4 positions shown · non-contrast
Comparison: 12/29/2017.

CLINICAL DATA: Left hip pain.

EXAM:
DG HIP (WITH OR WITHOUT PELVIS) 2-3V LEFT

[pelvis ap (1 of 2)]
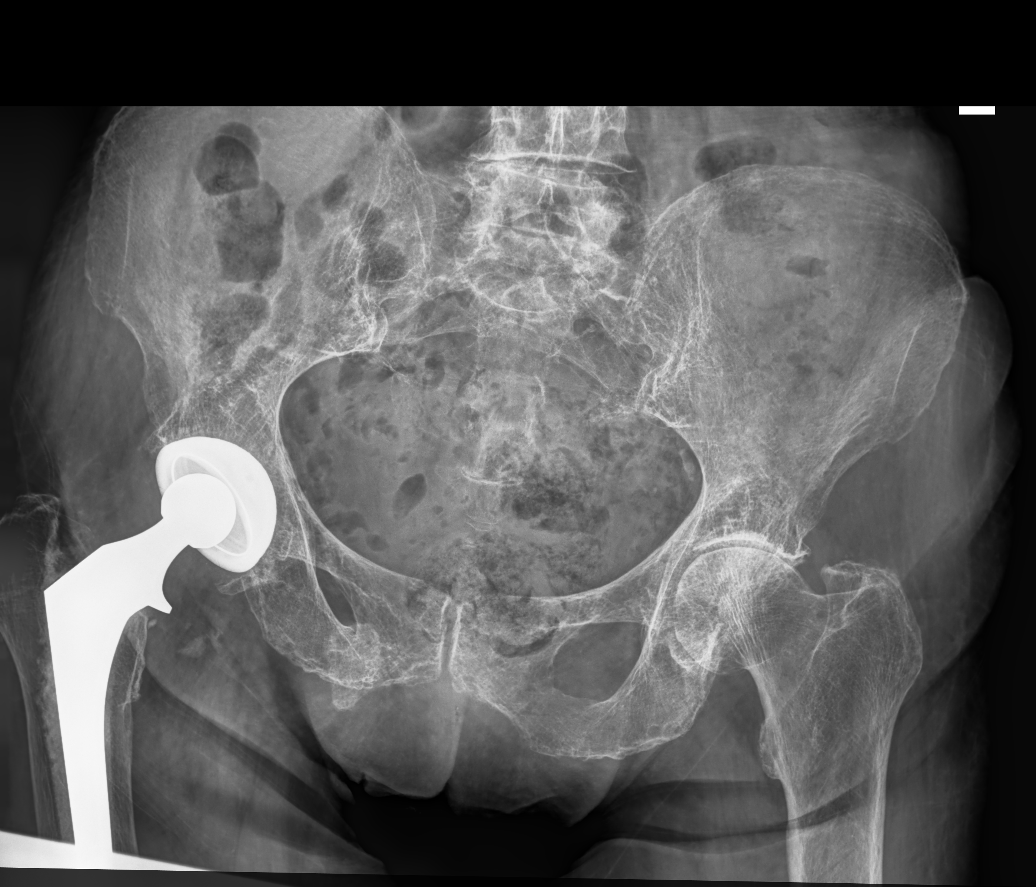

[hip joint ap]
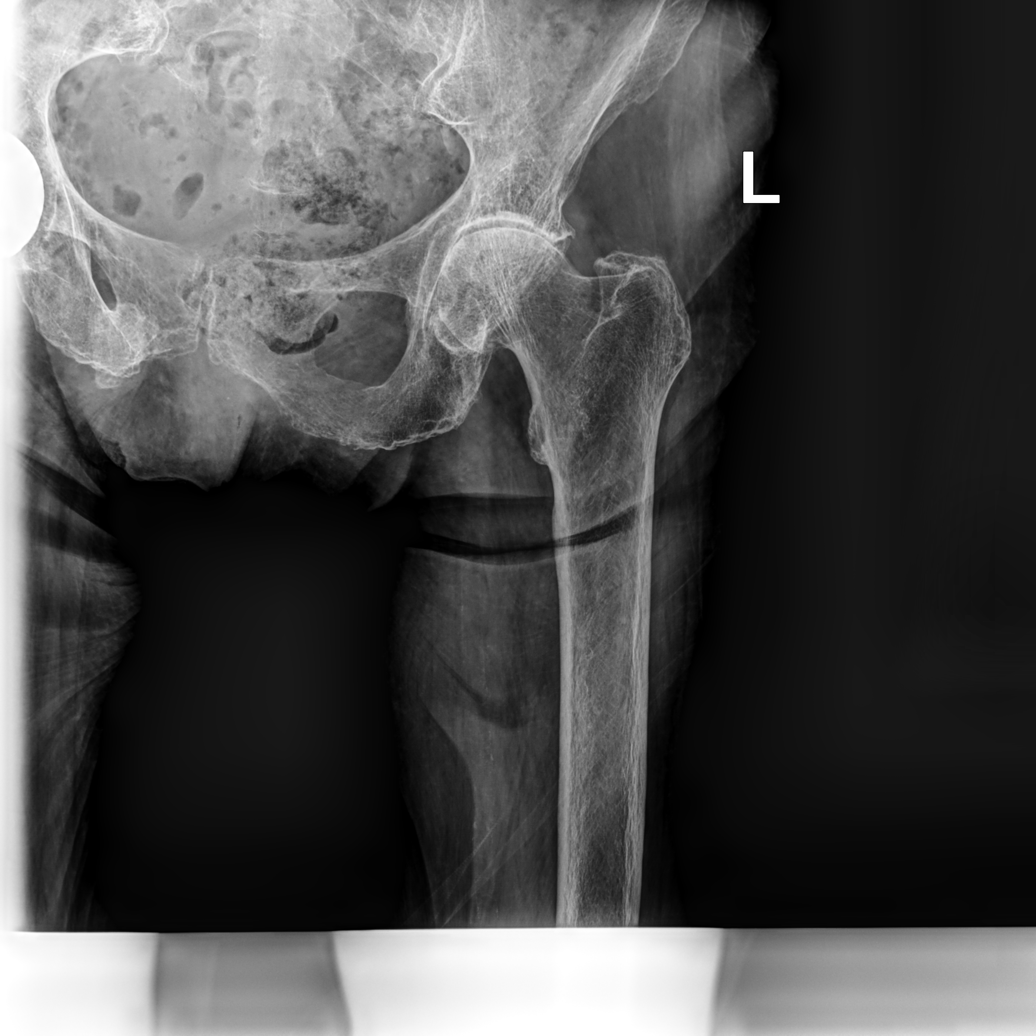

[hip (frog leg) lat]
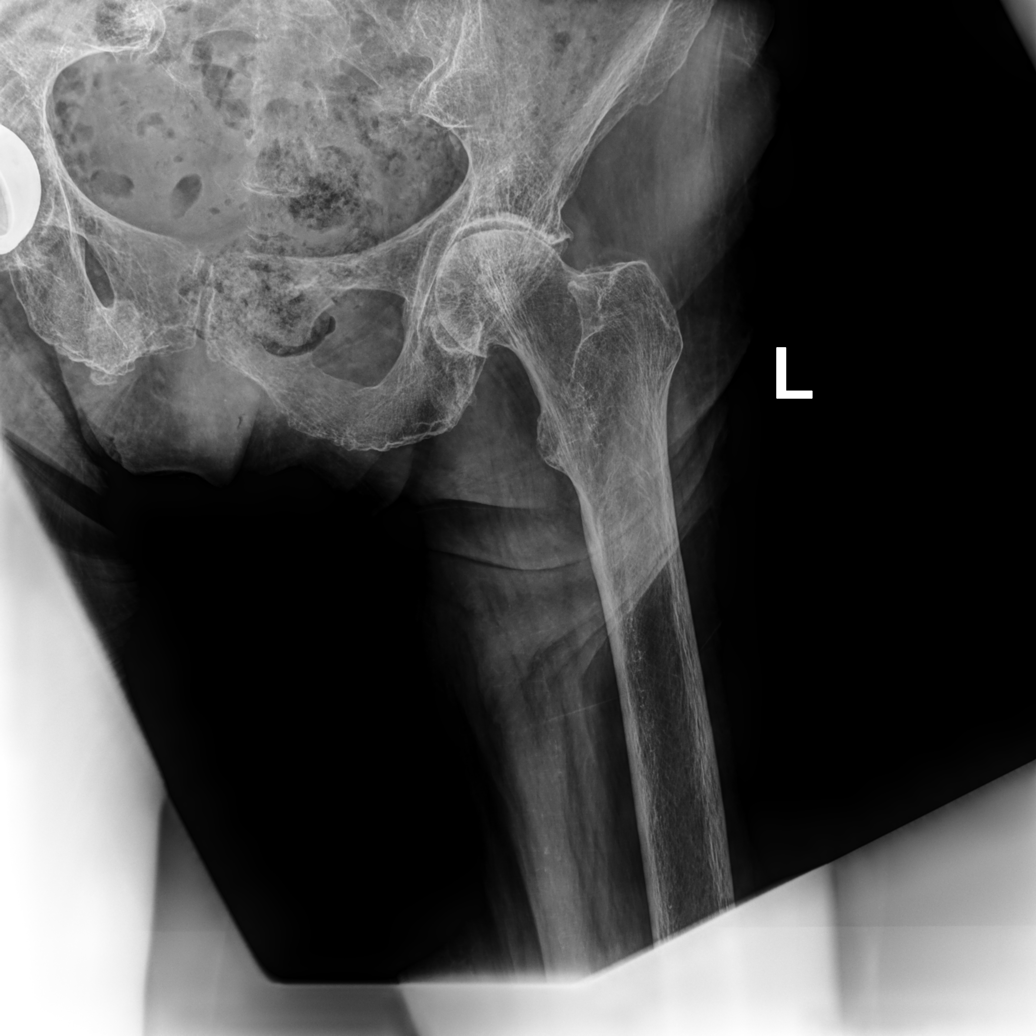

[pelvis ap (2 of 2)]
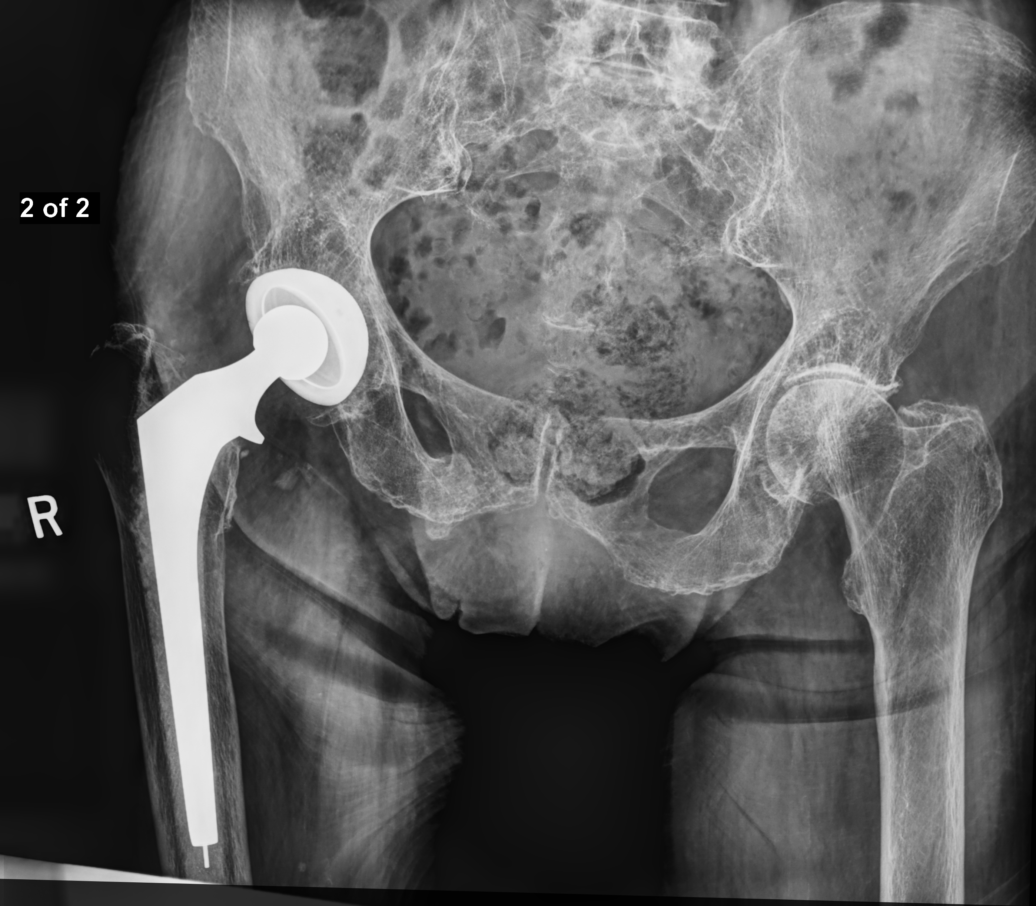

[4 of 4 positions shown; findings below may reference images not displayed]

FINDINGS: Total right hip replacement. Anatomic alignment. Hardware intact.
Diffuse severe osteopenia. Severe degenerative changes lumbar spine
and left hip.
IMPRESSION: 1.  Total right hip replacement.  Anatomic alignment.

2. Diffuse severe osteopenia. Severe degenerative changes lumbar
spine and left hip. No acute bony abnormality. No evidence of hip
fracture. Exam appears stable from 12/29/2017.
